# Patient Record
Sex: Male | Born: 1955 | Race: White | Hispanic: No | Smoking: Never smoker
Health system: Southern US, Community
[De-identification: ages and names within clinical notes are randomized; demographics above are authoritative.]

## PROBLEM LIST (undated history)

## (undated) DIAGNOSIS — I2510 Atherosclerotic heart disease of native coronary artery without angina pectoris: Secondary | ICD-10-CM

## (undated) DIAGNOSIS — I10 Essential (primary) hypertension: Secondary | ICD-10-CM

## (undated) DIAGNOSIS — N529 Male erectile dysfunction, unspecified: Secondary | ICD-10-CM

## (undated) DIAGNOSIS — Z5189 Encounter for other specified aftercare: Secondary | ICD-10-CM

## (undated) DIAGNOSIS — I4892 Unspecified atrial flutter: Secondary | ICD-10-CM

## (undated) DIAGNOSIS — L409 Psoriasis, unspecified: Secondary | ICD-10-CM

## (undated) DIAGNOSIS — E119 Type 2 diabetes mellitus without complications: Secondary | ICD-10-CM

## (undated) DIAGNOSIS — G5600 Carpal tunnel syndrome, unspecified upper limb: Secondary | ICD-10-CM

## (undated) DIAGNOSIS — I219 Acute myocardial infarction, unspecified: Secondary | ICD-10-CM

## (undated) DIAGNOSIS — M1990 Unspecified osteoarthritis, unspecified site: Secondary | ICD-10-CM

## (undated) DIAGNOSIS — E7800 Pure hypercholesterolemia, unspecified: Secondary | ICD-10-CM

## (undated) DIAGNOSIS — Z8719 Personal history of other diseases of the digestive system: Secondary | ICD-10-CM

## (undated) DIAGNOSIS — Z9289 Personal history of other medical treatment: Secondary | ICD-10-CM

## (undated) DIAGNOSIS — K219 Gastro-esophageal reflux disease without esophagitis: Secondary | ICD-10-CM

## (undated) HISTORY — PX: BICEPS TENDON REPAIR: SHX566

## (undated) HISTORY — PX: FRACTURE SURGERY: SHX138

## (undated) HISTORY — DX: Psoriasis, unspecified: L40.9

## (undated) HISTORY — DX: Essential (primary) hypertension: I10

## (undated) HISTORY — DX: Unspecified osteoarthritis, unspecified site: M19.90

## (undated) HISTORY — DX: Encounter for other specified aftercare: Z51.89

## (undated) HISTORY — DX: Type 2 diabetes mellitus without complications: E11.9

## (undated) HISTORY — DX: Acute myocardial infarction, unspecified: I21.9

## (undated) HISTORY — DX: Male erectile dysfunction, unspecified: N52.9

## (undated) HISTORY — DX: Atherosclerotic heart disease of native coronary artery without angina pectoris: I25.10

## (undated) HISTORY — DX: Carpal tunnel syndrome, unspecified upper limb: G56.00

## (undated) HISTORY — DX: Unspecified atrial flutter: I48.92

---
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
## 1977-12-03 DIAGNOSIS — Z9289 Personal history of other medical treatment: Secondary | ICD-10-CM

## 1977-12-03 HISTORY — DX: Personal history of other medical treatment: Z92.89

## 1977-12-03 HISTORY — PX: WRIST FRACTURE SURGERY: SHX121

## 2006-12-03 HISTORY — PX: CORONARY ANGIOPLASTY WITH STENT PLACEMENT: SHX49

## 2010-10-02 HISTORY — PX: CORONARY ARTERY BYPASS GRAFT: SHX141

## 2012-12-03 HISTORY — PX: CYST EXCISION: SHX5701

## 2017-06-03 LAB — HM DIABETES EYE EXAM

## 2017-07-02 ENCOUNTER — Encounter (HOSPITAL_COMMUNITY): Payer: Self-pay | Admitting: *Deleted

## 2017-07-02 ENCOUNTER — Inpatient Hospital Stay (HOSPITAL_COMMUNITY)
Admission: EM | Admit: 2017-07-02 | Discharge: 2017-07-05 | DRG: 309 | Disposition: A | Discharge status: Home / Self Care | Payer: BLUE CROSS/BLUE SHIELD | Attending: Family Medicine | Admitting: Family Medicine

## 2017-07-02 ENCOUNTER — Emergency Department (HOSPITAL_COMMUNITY): Payer: BLUE CROSS/BLUE SHIELD

## 2017-07-02 DIAGNOSIS — Z8249 Family history of ischemic heart disease and other diseases of the circulatory system: Secondary | ICD-10-CM | POA: Diagnosis not present

## 2017-07-02 DIAGNOSIS — Z7982 Long term (current) use of aspirin: Secondary | ICD-10-CM

## 2017-07-02 DIAGNOSIS — R001 Bradycardia, unspecified: Secondary | ICD-10-CM | POA: Diagnosis not present

## 2017-07-02 DIAGNOSIS — J449 Chronic obstructive pulmonary disease, unspecified: Secondary | ICD-10-CM | POA: Diagnosis present

## 2017-07-02 DIAGNOSIS — I4581 Long QT syndrome: Secondary | ICD-10-CM | POA: Diagnosis present

## 2017-07-02 DIAGNOSIS — Y92009 Unspecified place in unspecified non-institutional (private) residence as the place of occurrence of the external cause: Secondary | ICD-10-CM

## 2017-07-02 DIAGNOSIS — E1165 Type 2 diabetes mellitus with hyperglycemia: Secondary | ICD-10-CM | POA: Diagnosis present

## 2017-07-02 DIAGNOSIS — I119 Hypertensive heart disease without heart failure: Secondary | ICD-10-CM | POA: Diagnosis present

## 2017-07-02 DIAGNOSIS — I272 Pulmonary hypertension, unspecified: Secondary | ICD-10-CM | POA: Diagnosis present

## 2017-07-02 DIAGNOSIS — I4891 Unspecified atrial fibrillation: Secondary | ICD-10-CM | POA: Diagnosis present

## 2017-07-02 DIAGNOSIS — I484 Atypical atrial flutter: Secondary | ICD-10-CM | POA: Diagnosis not present

## 2017-07-02 DIAGNOSIS — Z7984 Long term (current) use of oral hypoglycemic drugs: Secondary | ICD-10-CM

## 2017-07-02 DIAGNOSIS — R531 Weakness: Secondary | ICD-10-CM

## 2017-07-02 DIAGNOSIS — I483 Typical atrial flutter: Secondary | ICD-10-CM | POA: Diagnosis present

## 2017-07-02 DIAGNOSIS — I2510 Atherosclerotic heart disease of native coronary artery without angina pectoris: Secondary | ICD-10-CM | POA: Diagnosis present

## 2017-07-02 DIAGNOSIS — I952 Hypotension due to drugs: Secondary | ICD-10-CM | POA: Diagnosis present

## 2017-07-02 DIAGNOSIS — G8921 Chronic pain due to trauma: Secondary | ICD-10-CM | POA: Diagnosis present

## 2017-07-02 DIAGNOSIS — Z951 Presence of aortocoronary bypass graft: Secondary | ICD-10-CM

## 2017-07-02 DIAGNOSIS — I959 Hypotension, unspecified: Secondary | ICD-10-CM | POA: Diagnosis not present

## 2017-07-02 DIAGNOSIS — T50905A Adverse effect of unspecified drugs, medicaments and biological substances, initial encounter: Secondary | ICD-10-CM | POA: Diagnosis present

## 2017-07-02 DIAGNOSIS — N179 Acute kidney failure, unspecified: Secondary | ICD-10-CM | POA: Diagnosis present

## 2017-07-02 DIAGNOSIS — I10 Essential (primary) hypertension: Secondary | ICD-10-CM | POA: Diagnosis not present

## 2017-07-02 DIAGNOSIS — I4892 Unspecified atrial flutter: Secondary | ICD-10-CM | POA: Diagnosis present

## 2017-07-02 HISTORY — DX: Gastro-esophageal reflux disease without esophagitis: K21.9

## 2017-07-02 HISTORY — DX: Personal history of other diseases of the digestive system: Z87.19

## 2017-07-02 HISTORY — DX: Type 2 diabetes mellitus without complications: E11.9

## 2017-07-02 HISTORY — DX: Acute myocardial infarction, unspecified: I21.9

## 2017-07-02 HISTORY — DX: Pure hypercholesterolemia, unspecified: E78.00

## 2017-07-02 HISTORY — DX: Atherosclerotic heart disease of native coronary artery without angina pectoris: I25.10

## 2017-07-02 HISTORY — DX: Personal history of other medical treatment: Z92.89

## 2017-07-02 HISTORY — DX: Essential (primary) hypertension: I10

## 2017-07-02 LAB — GLUCOSE, CAPILLARY
Glucose-Capillary: 147 mg/dL — ABNORMAL HIGH (ref 65–99)
Glucose-Capillary: 146 mg/dL — ABNORMAL HIGH (ref 65–99)

## 2017-07-02 LAB — BASIC METABOLIC PANEL
Anion gap: 10 (ref 5–15)
BUN: 47 mg/dL — ABNORMAL HIGH (ref 6–20)
CO2: 23 mmol/L (ref 22–32)
Calcium: 9.2 mg/dL (ref 8.9–10.3)
Chloride: 102 mmol/L (ref 101–111)
Creatinine, Ser: 2.07 mg/dL — ABNORMAL HIGH (ref 0.61–1.24)
GFR calc Af Amer: 38 mL/min — ABNORMAL LOW (ref >60)
GFR calc non Af Amer: 33 mL/min — ABNORMAL LOW (ref >60)
Glucose, Bld: 287 mg/dL — ABNORMAL HIGH (ref 65–99)
Potassium: 4.7 mmol/L (ref 3.5–5.1)
Sodium: 135 mmol/L (ref 135–145)

## 2017-07-02 LAB — URINALYSIS, ROUTINE W REFLEX MICROSCOPIC
Bilirubin Urine: NEGATIVE
Glucose, UA: NEGATIVE mg/dL
Hgb urine dipstick: NEGATIVE
Ketones, ur: NEGATIVE mg/dL
Leukocytes, UA: NEGATIVE
Nitrite: NEGATIVE
Protein, ur: 100 mg/dL — AB
Specific Gravity, Urine: 1.018 (ref 1.005–1.030)
Squamous Epithelial / LPF: NONE SEEN
pH: 5 (ref 5.0–8.0)

## 2017-07-02 LAB — I-STAT TROPONIN, ED: Troponin i, poc: 0.02 ng/mL (ref 0.00–0.08)

## 2017-07-02 LAB — CBC
HCT: 35 % — ABNORMAL LOW (ref 39.0–52.0)
Hemoglobin: 11.9 g/dL — ABNORMAL LOW (ref 13.0–17.0)
MCH: 33 pg (ref 26.0–34.0)
MCHC: 34 g/dL (ref 30.0–36.0)
MCV: 97 fL (ref 78.0–100.0)
Platelets: 236 10*3/uL (ref 150–400)
RBC: 3.61 MIL/uL — ABNORMAL LOW (ref 4.22–5.81)
RDW: 13.6 % (ref 11.5–15.5)
WBC: 5.7 10*3/uL (ref 4.0–10.5)

## 2017-07-02 LAB — LIPID PANEL
Cholesterol: 203 mg/dL — ABNORMAL HIGH (ref 0–200)
HDL: 36 mg/dL — ABNORMAL LOW (ref >40)
LDL Cholesterol: 142 mg/dL — ABNORMAL HIGH (ref 0–99)
Total CHOL/HDL Ratio: 5.6 RATIO
Triglycerides: 125 mg/dL (ref <150)
VLDL: 25 mg/dL (ref 0–40)

## 2017-07-02 LAB — TROPONIN I: Troponin I: <0.03 ng/mL (ref <0.03)

## 2017-07-02 LAB — TSH: TSH: 1.097 u[IU]/mL (ref 0.350–4.500)

## 2017-07-02 LAB — BRAIN NATRIURETIC PEPTIDE: B Natriuretic Peptide: 179 pg/mL — ABNORMAL HIGH (ref 0.0–100.0)

## 2017-07-02 LAB — CBG MONITORING, ED: Glucose-Capillary: 287 mg/dL — ABNORMAL HIGH (ref 65–99)

## 2017-07-02 MED ORDER — CLONIDINE HCL 0.1 MG PO TABS
0.1000 mg | ORAL_TABLET | Freq: Two times a day (BID) | ORAL | Status: DC
  Administered 2017-07-02: 0.1 mg via ORAL
  Filled 2017-07-02: qty 1

## 2017-07-02 MED ORDER — POTASSIUM CHLORIDE CRYS ER 10 MEQ PO TBCR
10.0000 meq | EXTENDED_RELEASE_TABLET | Freq: Every day | ORAL | Status: DC
  Administered 2017-07-03 – 2017-07-05 (×3): 10 meq via ORAL
  Filled 2017-07-02 (×3): qty 1

## 2017-07-02 MED ORDER — ACETAMINOPHEN 325 MG PO TABS
650.0000 mg | ORAL_TABLET | Freq: Four times a day (QID) | ORAL | Status: DC | PRN
  Administered 2017-07-04 (×2): 650 mg via ORAL
  Filled 2017-07-02 (×2): qty 2

## 2017-07-02 MED ORDER — SODIUM CHLORIDE 0.9 % IV BOLUS (SEPSIS)
500.0000 mL | Freq: Once | INTRAVENOUS | Status: AC
  Administered 2017-07-02: 500 mL via INTRAVENOUS

## 2017-07-02 MED ORDER — INSULIN ASPART 100 UNIT/ML ~~LOC~~ SOLN
0.0000 [IU] | Freq: Three times a day (TID) | SUBCUTANEOUS | Status: DC
  Administered 2017-07-02: 2 [IU] via SUBCUTANEOUS
  Administered 2017-07-03: 3 [IU] via SUBCUTANEOUS
  Administered 2017-07-03: 5 [IU] via SUBCUTANEOUS
  Administered 2017-07-03: 8 [IU] via SUBCUTANEOUS
  Administered 2017-07-04: 3 [IU] via SUBCUTANEOUS
  Administered 2017-07-04: 5 [IU] via SUBCUTANEOUS
  Administered 2017-07-04: 3 [IU] via SUBCUTANEOUS
  Administered 2017-07-05: 11 [IU] via SUBCUTANEOUS
  Administered 2017-07-05: 8 [IU] via SUBCUTANEOUS

## 2017-07-02 MED ORDER — METOPROLOL SUCCINATE ER 100 MG PO TB24
100.0000 mg | ORAL_TABLET | Freq: Two times a day (BID) | ORAL | Status: DC
  Administered 2017-07-02: 100 mg via ORAL
  Filled 2017-07-02: qty 1

## 2017-07-02 MED ORDER — PROMETHAZINE HCL 25 MG PO TABS: 12.5000 mg | ORAL_TABLET | Freq: Four times a day (QID) | ORAL | Status: DC | PRN

## 2017-07-02 MED ORDER — ASPIRIN EC 81 MG PO TBEC
81.0000 mg | DELAYED_RELEASE_TABLET | Freq: Every day | ORAL | Status: DC
  Administered 2017-07-03 – 2017-07-05 (×3): 81 mg via ORAL
  Filled 2017-07-02 (×3): qty 1

## 2017-07-02 MED ORDER — ACETAMINOPHEN 650 MG RE SUPP: 650.0000 mg | Freq: Four times a day (QID) | RECTAL | Status: DC | PRN

## 2017-07-02 MED ORDER — CLOPIDOGREL BISULFATE 75 MG PO TABS
75.0000 mg | ORAL_TABLET | Freq: Every day | ORAL | Status: DC
  Administered 2017-07-03 – 2017-07-05 (×3): 75 mg via ORAL
  Filled 2017-07-02 (×3): qty 1

## 2017-07-02 MED ORDER — DIGESTIVE ENZYME PO CAPS: ORAL_CAPSULE | Freq: Every day | ORAL | Status: DC

## 2017-07-02 MED ORDER — APIXABAN 5 MG PO TABS
5.0000 mg | ORAL_TABLET | Freq: Two times a day (BID) | ORAL | Status: DC
  Administered 2017-07-02 – 2017-07-05 (×6): 5 mg via ORAL
  Filled 2017-07-02 (×7): qty 1

## 2017-07-02 NOTE — ED Provider Notes (Signed)
[facility]-EMERGENCY DEPT Provider Note   CSN: [id number] Arrival date & time: 07/02/17  1109     History   Chief Complaint Chief Complaint  Patient presents with  . Dizziness  . Weakness    HPI Steve [name] is a 61 y.o. male.  HPI 61 year old Caucasian male past medical history significant for diabetes, hypertension, CAD status post CABG in 2011 that presents to the ED today with complaints of overall weakness, dizziness, lightheadedness with standing, lethargy. The patient states the symptoms have been ongoing for the past 2-3 weeks. States that his symptoms are worse with standing. Tolerating by mouth fluids without any difficulties. Patient does report some mild dyspnea on exertion over the past 2-3 weeks that is worsening. Also reports some worsening lower extremity edema. Denies any lower extremity pain or calf tenderness. Patient denies any orthopnea or nocturnal dyspnea.  Of note patient does report taking Viagra several times in the past 3 weeks. Patient denies any chest pain, palpitations, nausea, vomiting, diaphoresis, abdominal pain, urinary symptoms, change in bowel habits. Denies any fever or chills.  Of note unable to obtain accurate medical records given outside hospital. Past Medical History:  Diagnosis Date  . Coronary artery disease   . Diabetes mellitus without complication (HCC)   . Hypertension     There are no active problems to display for this patient.   Past Surgical History:  Procedure Laterality Date  . CARDIAC SURGERY    . HERNIA REPAIR    . JOINT REPLACEMENT     l knee       Home Medications    Prior to Admission medications   Medication Sig Start Date End Date Taking? Authorizing Provider  amLODipine (NORVASC) 5 MG tablet Take 5 mg by mouth 2 (two) times daily. 05/28/17  Yes [provider]  CALCIUM PO Take 1 tablet by mouth daily.   Yes [provider]  cloNIDine (CATAPRES) 0.1 MG tablet Take 0.2 mg by mouth 2 (two) times  daily. 06/24/17  Yes [provider]  clopidogrel (PLAVIX) 75 MG tablet Take 75 mg by mouth daily. 06/24/17  Yes [provider]  Digestive Enzymes (DIGESTIVE ENZYME PO) Take 1 capsule by mouth daily.   Yes [provider]  furosemide (LASIX) 20 MG tablet Take 40 mg by mouth daily. 06/24/17  Yes [provider]  glyBURIDE micronized (GLYNASE) 3 MG tablet Take 3 mg by mouth 2 (two) times daily with a meal. 05/29/17  Yes [provider]  ibuprofen (ADVIL,MOTRIN) 200 MG tablet Take 200 mg by mouth every 6 (six) hours as needed for moderate pain.   Yes [provider]  JANUMET 50-500 MG tablet Take 1 tablet by mouth 2 (two) times daily with a meal. 06/24/17  Yes [provider]  lisinopril (PRINIVIL,ZESTRIL) 20 MG tablet Take 20 mg by mouth 2 (two) times daily. 05/28/17  Yes [provider]  MAGNESIUM-ZINC PO Take 1 capsule by mouth daily as needed (supplement).   Yes [provider]  metoprolol (TOPROL-XL) 200 MG 24 hr tablet Take 200 mg by mouth 2 (two) times daily. 06/19/17  Yes [provider]  potassium chloride (MICRO-K) 10 MEQ CR capsule Take 10 mEq by mouth daily. with food 06/24/17  Yes [provider]  sildenafil (VIAGRA) 100 MG tablet Take 100 mg by mouth daily as needed for erectile dysfunction. 06/24/17  Yes [provider]  tiZANidine (ZANAFLEX) 4 MG capsule Take 4 mg by mouth 3 (three) times daily as needed  for muscle spasms. 06/24/17  Yes [provider]    Family History No family history on file.  Social History Social History  Substance Use Topics  . Smoking status: Never Smoker  . Smokeless tobacco: Never Used  . Alcohol use Yes     Comment: occ     Allergies   Patient has no known allergies.   Review of Systems Review of Systems  Constitutional: Negative for chills, diaphoresis and fever.  HENT: Negative for congestion.   Eyes: Negative for visual  disturbance.  Respiratory: Positive for shortness of breath. Negative for cough.   Cardiovascular: Positive for leg swelling. Negative for chest pain and palpitations.  Gastrointestinal: Negative for abdominal pain, diarrhea, nausea and vomiting.  Genitourinary: Negative for dysuria, flank pain, frequency, hematuria and urgency.  Musculoskeletal: Negative for arthralgias and myalgias.  Skin: Negative for rash.  Neurological: Positive for dizziness, syncope, weakness and light-headedness. Negative for numbness and headaches.  Psychiatric/Behavioral: Negative for sleep disturbance. The patient is not nervous/anxious.      Physical Exam Updated Vital Signs BP (!) 98/52 (BP Location: Right Arm)   Pulse (!) 58   Temp 98 F (36.7 C) (Oral)   Resp 12   SpO2 98%   Physical Exam  Constitutional: He is oriented to person, place, and time. He appears well-developed and well-nourished.  Non-toxic appearance. No distress.  HENT:  Head: Normocephalic and atraumatic.  Nose: Nose normal.  Mouth/Throat: Oropharynx is clear and moist.  Eyes: Pupils are equal, round, and reactive to light. Conjunctivae are normal. Right eye exhibits no discharge. Left eye exhibits no discharge.  Neck: Normal range of motion. Neck supple. No JVD present. No tracheal deviation present.  Cardiovascular: Regular rhythm, normal heart sounds and intact distal pulses.  Bradycardia present.  Exam reveals no gallop and no friction rub.   No murmur heard. Pulmonary/Chest: Effort normal and breath sounds normal. No respiratory distress. He has no wheezes. He has no rales. He exhibits no tenderness.  No hypoxia or tachypnea.  Abdominal: Soft. Bowel sounds are normal. He exhibits no distension. There is no tenderness. There is no rebound and no guarding.  Musculoskeletal: Normal range of motion.  Bilateral pitting edema 1+ at the level of the shin. No calf tenderness.  Lymphadenopathy:    He has no cervical adenopathy.    Neurological: He is alert and oriented to person, place, and time.  The patient is alert, attentive, and oriented x 3. Speech is clear. Cranial nerve II-VII grossly intact. Negative pronator drift. Sensation intact. Strength 5/5 in all extremities. Reflexes 2+ and symmetric at biceps, triceps, knees, and ankles. Rapid alternating movement and fine finger movements intact. Romberg is absent. Posture and gait normal.   Skin: Skin is warm and dry. Capillary refill takes less than 2 seconds. He is not diaphoretic. There is pallor.  Psychiatric: His behavior is normal. Judgment and thought content normal.  Nursing note and vitals reviewed.    ED Treatments / Results  Labs (all labs ordered are listed, but only abnormal results are displayed) Labs Reviewed  BASIC METABOLIC PANEL - Abnormal; Notable for the following:       Result Value   Glucose, Bld 287 (*)    BUN 47 (*)    Creatinine, Ser 2.07 (*)    GFR calc non Af Amer 33 (*)    GFR calc Af Amer 38 (*)    All other components within normal limits  CBC - Abnormal; Notable for the following:  RBC 3.61 (*)    Hemoglobin 11.9 (*)    HCT 35.0 (*)    All other components within normal limits  BRAIN NATRIURETIC PEPTIDE - Abnormal; Notable for the following:    B Natriuretic Peptide 179.0 (*)    All other components within normal limits  CBG MONITORING, ED - Abnormal; Notable for the following:    Glucose-Capillary 287 (*)    All other components within normal limits  URINALYSIS, ROUTINE W REFLEX MICROSCOPIC  I-STAT TROPONIN, ED    EKG  EKG Interpretation  Date/Time:  Tuesday July 02 2017 11:50:43 EDT Ventricular Rate:  61 PR Interval:    QRS Duration: 98 QT Interval:  466 QTC Calculation: 469 R Axis:   -16 Text Interpretation:  Atrial flutter Nonspecific ST and T wave abnormality Prolonged QT Abnormal ECG no previous EKG  Confirmed by Crista CurbLiu, Dana [phone]([id number]) on 07/02/2017 12:59:30 PM       Radiology Dg Chest 2 View  Result  Date: 07/02/2017 CLINICAL DATA:  lightheaded and dizzy with standing, lethargic, tired, and cant focus. Feels exhausted. Pt is hypotensive at triage.hx htn,dm EXAM: CHEST  2 VIEW COMPARISON:  None. FINDINGS: Sternotomy wires overlie enlarged cardiac silhouette. No effusion, infiltrate pneumothorax. No pulmonary edema. No acute osseous abnormality. IMPRESSION: Cardiomegaly without acute cardiopulmonary process identified. Electronically Signed   By: Genevive BiStewart  Edmunds M.D.   On: 07/02/2017 13:06    Procedures Procedures (including critical care time)  Medications Ordered in ED Medications  sodium chloride 0.9 % bolus 500 mL (not administered)     Initial Impression / Assessment and Plan / ED Course  I have reviewed the triage vital signs and the nursing notes.  Pertinent labs & imaging results that were available during my care of the patient were reviewed by me and considered in my medical decision making (see chart for details).     Patient presents to the ED with a complaint of dizziness, weakness, exertional dyspnea that has been worsening over the past 2-3 weeks. Patient is with history of CABG in 2011. Reports worsening lower extremity edema. Endorses some mild shortness of breath. Denies any chest pain or palpitations. Initially in triage patient was bradycardic and hypotensive. However patient was speaking complete sentences and looked overall well appearing. Patient given a small fluid bolus with improvement in his pressures. Heart rate has remained bradycardic in the low 40's.   EKG showed atrial flutter and bradycardia no old tracing to compare. Patient states he does have a history of arrhythmia but unsure what it is. Not currently anticoagulated. A troponin was negative. No leukocytosis. Patient with creatinine of 2.0 unknown baseline. No known CKD. BNP mildly elevated at 179. Chest x-ray shows cardiomegaly but no signs of infiltrate or pulmonary edema.  Do not feel that patient  needs emergent intervention at this time. Due to patient would benefit from admission to the hospital for observation and possible cardiology consult. Spoke with family medicine teaching service who agrees to admit patient will come to the ED to evaluate. Patient is currently hemodynamically stable with blood pressure that has improved with fluids. The patient was seen by my attending Dr. Verdie MosherLiu who is agreeable to the above plan. Final Clinical Impressions(s) / ED Diagnoses   Final diagnoses:  Weakness  Bradycardia  Hypotension, unspecified hypotension type    New Prescriptions New Prescriptions   No medications on file     Wallace KellerLeaphart, Kenneth T, PA-C 07/02/17 1510    Lavera GuiseLiu, Dana Duo, MD 07/02/17 [phone]  Lavera Guise, MD 07/02/17 [phone]

## 2017-07-02 NOTE — Consult Note (Signed)
Cardiology Consultation:   Patient ID: Steve [name]; [id number]; [birth year]   Admit date: 07/02/2017 Date of Consult: 07/02/2017  Primary Care Provider: System, Pcp Not In Primary Cardiologist: new, [name] Primary Electrophysiologist:  none   Patient Profile:   Steve [name] is a 61 y.o. male with a hx of CAD  who is being seen today for the evaluation of  Atrial flutter  at the request of  Dr. Perley JainMcdiarmid.  History of Present Illness:   [name] has a hx of CAD - s/p  Stenting ( 2008)  and then CABG ( 2011) , htn, hyperlipidemia, DM Has felt lighthead for the past several weeks. Fatigued,  No syncope . No angina ,  Not very active.limited by hip pain.    .  Is on plavix for his CAD   Works in the [employer]    Past Medical History:  Diagnosis Date  . Coronary artery disease   . Diabetes mellitus without complication (HCC)   . Hypertension     Past Surgical History:  Procedure Laterality Date  . CARDIAC SURGERY    . HERNIA REPAIR    . JOINT REPLACEMENT     l knee     Inpatient Medications: Scheduled Meds: . aspirin EC  81 mg Oral Daily   Continuous Infusions:  PRN Meds:   Allergies:   No Known Allergies  Social History:   Social History   Social History  . Marital status: Divorced    Spouse name: N/A  . Number of children: N/A  . Years of education: N/A   Occupational History  . Not on file.   Social History Main Topics  . Smoking status: Never Smoker  . Smokeless tobacco: Never Used  . Alcohol use Yes     Comment: occ  . Drug use: No  . Sexual activity: Not on file   Other Topics Concern  . Not on file   Social History Narrative  . No narrative on file    Non smoker Drinks occasionally   Family History:   The patient's family history includes CAD in his father and mother.  ROS:  Please see the history of present illness.  ROS  All other ROS reviewed and negative.     Physical Exam/Data:   Vitals:   07/02/17 1440  07/02/17 1441 07/02/17 1615 07/02/17 1630  BP: (!) 108/58 (!) 98/52 117/68 121/71  Pulse:   (!) 58 (!) 58  Resp:   (!) 21 (!) 22  Temp:      TempSrc:      SpO2:   97% 97%    Intake/Output Summary (Last 24 hours) at 07/02/17 1643 Last data filed at 07/02/17 1634  Gross per 24 hour  Intake              500 ml  Output                0 ml  Net              500 ml    Wt Readings from Last 3 Encounters:  07/02/17 (!) 320 lb (145.2 kg)   BP 140/82 (BP Location: Left Arm)   Pulse (!) 58   Temp 97.8 F (36.6 C) (Oral)   Resp (!) 22   Wt (!) 320 lb (145.2 kg)   SpO2 100%   BP 121/ 71 hR 60  There is no height or weight on file to calculate BMI.  General:  Well nourished, well  developed, in no acute distress HEENT: normal Lymph: no adenopathy Neck: no JVD Endocrine:  No thryomegaly Vascular: No carotid bruits; FA pulses 2+ bilaterally without bruits  Cardiac:  normal S1, S2; RRR; no murmur  Lungs:  clear to auscultation bilaterally, no wheezing, rhonchi or rales  Abd: soft, nontender, no hepatomegaly  Ext: no edema Musculoskeletal:  No deformities, BUE and BLE strength normal and equal Skin: warm and dry  Neuro:  CNs 2-12 intact, no focal abnormalities noted Psych:  Normal affect   EKG:  The EKG was personally reviewed and demonstrates:  Atrial flutter with controlled V response.   Telemetry:  Telemetry was personally reviewed and demonstrates:  Atrial flutter with slow / controlled V response  With Carotid sinus massage, he had a 5.2 second pause   Relevant CV Studies:   Laboratory Data:  Chemistry  Recent Labs Lab 07/02/17 1200  NA 135  K 4.7  CL 102  CO2 23  GLUCOSE 287*  BUN 47*  CREATININE 2.07*  CALCIUM 9.2  GFRNONAA 33*  GFRAA 38*  ANIONGAP 10    No results for input(s): PROT, ALBUMIN, AST, ALT, ALKPHOS, BILITOT in the last 168 hours. Hematology  Recent Labs Lab 07/02/17 1200  WBC 5.7  RBC 3.61*  HGB 11.9*  HCT 35.0*  MCV 97.0  MCH  33.0  MCHC 34.0  RDW 13.6  PLT 236   Cardiac EnzymesNo results for input(s): TROPONINI in the last 168 hours.   Recent Labs Lab 07/02/17 1217  TROPIPOC 0.02    BNP  Recent Labs Lab 07/02/17 1317  BNP 179.0*    DDimer No results for input(s): DDIMER in the last 168 hours.  Radiology/Studies:  Dg Chest 2 View  Result Date: 07/02/2017 CLINICAL DATA:  lightheaded and dizzy with standing, lethargic, tired, and cant focus. Feels exhausted. Pt is hypotensive at triage.hx htn,dm EXAM: CHEST  2 VIEW COMPARISON:  None. FINDINGS: Sternotomy wires overlie enlarged cardiac silhouette. No effusion, infiltrate pneumothorax. No pulmonary edema. No acute osseous abnormality. IMPRESSION: Cardiomegaly without acute cardiopulmonary process identified. Electronically Signed   By: Genevive BiStewart  Edmunds M.D.   On: 07/02/2017 13:06    Assessment and Plan:   1. Atrial fib / flutter  - chADS 2VASDC score   3 ( CAD, HTN DM) His HR is very slow - he is on several agents that would slow his HR He had a long pause when I performed carotid sinus massage.  He does not recall whether or not he has had atrial fib / flutter in the past .  [place]   Would start eliquis. Decrease his metoprolol by 1/2( to 100 mg BID)  and clonidine by 1/2. ( to 0.1 mg BID)   Echo   2.  Essential HTN:   Will decrease metoprolol and clonidine as above. We could probably decrease the amlodipine to 5 mg a day ( instead of BID)  I would anticipate continuing the Lisinopril at his home dose.   Continue lasix   3.  CAD :  No angina    Steve MissPhilip Nahser, MD  07/02/2017 4:43 PM

## 2017-07-02 NOTE — ED Notes (Signed)
Patient transported to X-ray 

## 2017-07-02 NOTE — H&P (Signed)
[facility] Admission History and Physical Service Pager: [phone]  Patient name: Steve [name] Ives Medical record number: [id number] Date of birth: May 24, 1956 Age: 61 y.o. Gender: male  Primary Care Provider: System, Pcp Not In Consultants: Cardiology  Code Status: FULL   Chief Complaint: weakness, shortness of breath   Assessment and Plan: Steve [name] Arntson is a 61 y.o. male presenting with progressive worsening of weakness and SOB x 2 weeks. PMH is significant for CAD s/p CABG in 2011, T2DM, HTN, carpel tunnel syndrome, R hip pain, and possible CHF.   Weakness Patient notes increasing weakness x 2 weeks. Patient reports feeling fatigued at work and unable to concentrate. Likely due to bradycardia and hypotension. Unlikely infectious in origin due to lack of fever or WBC. Unlikely related to hypoglycemia due to reported compliance with medications and CBGs averaging in 110-120s at home. Unlikely hypothyroidism due to lack of history of thyroid condition and no thyromegaly on exam. Possible anemia due to Hgb of 11.9 on admission, but less likely primary cause due to only slight drop in Hgb. Baseline Hgb unknown as patient is new to the area and no labs per chart review.  Patient is currently on Norvasc, lisinopril, toprol, viagra, and clonidine - all medications which can lower BP. At arrival to ED patient noted to be bradycardic, as low as 30-40 bpm. EKG in ED read as atrial flutter, questionable heart block per our read.  Cardiology was consulted and discussed that patient had atrial flutter with slow ventricular response. Recommendations to decrease home dose metoprolol and clonidine by half. Cardiology recommendations to also start Eliquis due to CHADVASC score of 3. In ED HR primarily in 50s and 60s, with lowest HR in 30s. Current HR 58. Patient also hypotensive with systolic pressures in the 80s at ED. Current BP of 117/68 following 500 cc bolus in ED. iStat troponin  negative in ED. CXR in ED negative.  -admit to telemetry, attending Dr. McDiarmid  -Continuous cardiac monitoring  -holding home medications of norvasc, lisinopril, and viagra -continue metoprolol and clonidine at half home dose -start Eliquis per pharmacy  -continue Plavix  -continue ASA 81 mg (pt reports taking daily however not on home med list) -Cardiology consulted, appreciate recommendations  -will likely need medication readjustment on discharge  -am CBC, and BMP  -trend troponins  -am EKG -Echo -Risk stratification labs: A1c, lipid panel, TSH -UA to rule out infectious etiology  -orthostatic BPs -vital signs per unit routine   Bradycardia Patient in ED found to be bradycardic with HR ranging from 41-59. ED physician noted as low as 30s. EKG in ED showing A flutter with slow ventricular response. Cardiology has been consulted, with recommendations to resume metoprolol and clonidine at half dose and to start Eliquis given CHADVASC score of 3.  -f/u cardiology recommendations  -monitor on telemetry -Hold home Clonidine and Tizanidine -am EKG -orthostatic BPs  Shortness of breath 2/2 possible CHF  Patient reports worsening SOB x 2 weeks, with worst episode 2 weeks ago when he had dyspnea when laying flat without PND. Patient now reports dyspnea on exertion, but denies cough or sputum production. Likely 2/2 fluid overload d/t possible history of CHF, 1+ pitting edema to bilateral LE on exam, and slight crackles auscultated on exam. Less likely new COPD due to lack of cough or sputum production and no history of smoking use. Less likely asthma related due to lack of history and no wheezes auscultated on exam. Possible cardiac in cause due  to history of cardiac disease and history of diabetes, but less likely given negative iStat troponin and EKG showing no ST elevations. Current O2 saturation of 97% on room air. Patient reported taking his lasix this morning.  Takes Lasix 20 mg BID at  home which he states is for his LE swelling.  Patient was given 500 cc bolus in ED due to hypotension. CXR negative on admission. Reports maybe having Echo done in past.  Pt does have BNP elevated to 179 and currently taking Lasix at home so may have some component of CHF.   -trend troponins  -Echo as above  -Hold home Lasix in setting of hypotension  -monitor fluid status  -monitor respiratory status  -continuous pulse ox   Anemia  On admission, CBC noting Hgb of 11.9, Hct of 35.0, MCV 97.0, platelets 236. Patient complained of fatigue and weakness  -continue to monitor -am CBC  Atrial flutter.  Seen on ED EKG.  Pt not on anticoagulation currently but has been told in the past he has an irregular heartbeat.  No history of CVA or DVT.  [place] in [place], [place].  Pt has a CHADSVASc score of 3.   -Per cards recs, start Eliquis    CAD s/p CABG 2011 Stable, denies CP and palpitations at this time.  No prior documentation in EMR as [place] [facility]  -cardiology consulted, appreciate recommendations.  At home takes Aspirin 81 mg and Plavix daily, although aspirin is not listed on home med list.  -trend troponins -A1C, lipid panel pending  -repeat EKG  -continue Plavix  - ASA 81 mg  T2DM Patient reports compliance with medications and CBGs ranging from 110-120 at home. Home medication of Janumet and Glyburide. On admission, CBG 287.  No recent A1c per records.  -Hold home hyperglycemic agents -continue to monitor  -Moderate sliding scale  -A1C  Prolonged QTc.  On ED EKG with QTc of 469.   -Phenergan for nausea -Avoid QTc prolonging medications   HTN.  Initially hypotensive on admission with BP 80s/60s.   Patient has reported history of HTN. Patient states he normally runs high, but average BP of 130/90 while on medications. Home medications of norvasc, lisinopril, toprol, and clonidine.  -hold home norvasc and lisinopril while hypotensive -continue toprol  and clonidine at half dose  Carpal Tunnel Patient has history of carpal tunnel syndrome, with no sensation in 2nd, 3rd, and 4th digits bilaterally.  -continue to monitor   Right hip pain, chronic Patient has history of right hip pain due to running accident. Patient states he will likely need hip replacement in the future. Patient is taking tizanidine prn for pain control and muscle spasm.  -discontinue tizanidine due to potential for bradycardia  -tylenol prn for pain -consider baclofen if increased pain   FEN/GI: carb modified/heart healthy diet  Prophylaxis: Eliquis   Disposition: admit to telemetry, attending Dr. McDiarmid  History of Present Illness:  Steve [name] Monfort is a 61 y.o. male presenting with progressive worsening of weakness and SOB x 2 weeks in duration. Patient reports symptoms began in the beginning of July and have progressively worsened with time. Patient states he has had increased leg swelling, despite daily lasix, and lightheadedness, dizziness, and SOB x 2 weeks. [place], so PMH is lacking in EMR. 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 Notes that along with lightheadedness and dizziness, patient has noticed decreased ability to concentrate and focus noted especially during a work meeting this morning. Patient has cardiac history of quadruple bypass in 2011 and is followed by Dr. Lodema Hong, cardiology, in [place], [place]. Patient states he was put on Toprol for "irregular heart beat" following procedure. Patient states his BP normally runs a little high, usually 130/90 with medications. Patient thinks he may have had an echo in the past.  Patient reports no strokes in the past. Patient denies nausea, vomiting, diarrhea, or headache. Patient is currently taking ASA, plavix, norvasc, lisinopril, toprol, and clonidine. Patient reports good compliance with  medications. Patient also reports worsening SOB x 2 weeks. Patient stated 2 weeks ago he was unable to lay in bed flat due to feelings of fluid in lungs, which was new in onset for him. Stated improvement since then. Patient endorses 2 pillow orthopnea w/out PND. Patient now notes dyspnea on exertion but not at rest. Patient denies cough or congestion, but endorses some sinus drainage which is normal for him. Along with cardiac medications patient reports compliance with Janumet and Glyburide w/ CBGs between 110-120, Lasix 20 mg bid, tizanidine prn, and multivitamins. Patient reports taking his home medications this morning.   Review Of Systems: Per HPI with the following additions:   Review of Systems  Constitutional: Negative for fever.  HENT: Positive for congestion. Negative for sore throat.   Respiratory: Positive for shortness of breath. Negative for cough, sputum production, wheezing and stridor.   Cardiovascular: Negative for chest pain and leg swelling.  Gastrointestinal: Negative for abdominal pain, nausea and vomiting.  Neurological: Positive for dizziness and weakness. Negative for tingling and headaches.   Patient Active Problem List   Diagnosis Date Noted  . Weakness 07/02/2017  . Bradycardia   . Hypotension    Past Medical History: Past Medical History:  Diagnosis Date  . Coronary artery disease   . Diabetes mellitus without complication (HCC)   . Hypertension    Past Surgical History: Past Surgical History:  Procedure Laterality Date  . CARDIAC SURGERY    . HERNIA REPAIR    . JOINT REPLACEMENT     l knee   Social History: Social History  Substance Use Topics  . Smoking status: Never Smoker  . Smokeless tobacco: Never Used  . Alcohol use Yes     Comment: occ   Additional social history: Lives at home alone.  Never smoker.  Occasional alcohol use. Denies drug use.   Please also refer to relevant sections of EMR.  Family History: Family History  Problem  Relation Age of Onset  . CAD Mother   . CAD Father    Mother passed away from heart attack age 30 and father passed away of a MI at age 74.    Allergies and Medications: No Known Allergies No current facility-administered medications on file prior to encounter.    No current outpatient prescriptions on file prior to encounter.   Objective: BP 140/82 (BP Location: Left Arm)   Pulse (!) 58   Temp 97.8 F (36.6 C) (Oral)   Resp (!) 22   Wt (!) 320 lb (145.2 kg)   SpO2 100%    Exam: General: awake and alert, NAD, lying in bed, oriented x 3  Eyes: PERRL, EOMI  ENTM: moist mucous membranes, o/p clear  Neck: neck supple, non tender, no thyromegaly  Cardiovascular: RRR, no MRG. 2+ radial pulses Respiratory: slight crackles auscultated  bilaterally  Gastrointestinal: soft, non tender, non distended, bowel sounds x 4 quadrants MSK: normal range of motion. 1+ pitting bilaterally.  Derm: skin intact  Neuro: no focal deficits. Equal sensation bilaterally  Psych: normal affect   Labs and Imaging: CBC BMET   Recent Labs Lab 07/02/17 1200  WBC 5.7  HGB 11.9*  HCT 35.0*  PLT 236    Recent Labs Lab 07/02/17 1200  NA 135  K 4.7  CL 102  CO2 23  BUN 47*  CREATININE 2.07*  GLUCOSE 287*  CALCIUM 9.2     BNP    Component Value Date/Time   BNP 179.0 (H) 07/02/2017 1317   Troponin (Point of Care Test)  Recent Labs  07/02/17 1217  TROPIPOC 0.02   CBG (last 3)   Recent Labs  07/02/17 1211 07/02/17 1805  GLUCAP 287* 147*   Dg Chest 2 View  Result Date: 07/02/2017 CLINICAL DATA:  lightheaded and dizzy with standing, lethargic, tired, and cant focus. Feels exhausted. Pt is hypotensive at triage.hx htn,dm EXAM: CHEST  2 VIEW COMPARISON:  None. FINDINGS: Sternotomy wires overlie enlarged cardiac silhouette. No effusion, infiltrate pneumothorax. No pulmonary edema. No acute osseous abnormality. IMPRESSION: Cardiomegaly without acute cardiopulmonary process identified.  Electronically Signed   By: Genevive Bi M.D.   On: 07/02/2017 13:06   Sherin Darin Engels PGY-1, [facility]   I have seen and examined the above patient with Dr. Darin Engels and agree with her documentation.  I have provided my corrections in blue.    Freddrick March, MD 07/02/2017, 6:17 PM PGY-2, [facility] FPTS Intern pager: [phone], text pages welcome

## 2017-07-02 NOTE — ED Triage Notes (Signed)
Pt states he started feeling bad mid July.  Pt states started having lightheaded and dizzy with standing, lethargic, tired, and cant focus.  Feels exhausted.  Pt is hypotensive at triage. Pt feels like an overall weakness. Pt reports sob and no chest pain

## 2017-07-02 NOTE — ED Notes (Signed)
Pt unable to urinate at this time.  

## 2017-07-02 NOTE — Progress Notes (Signed)
ANTICOAGULATION CONSULT NOTE - Initial Consult  Pharmacy Consult for Apixaban Indication: atrial flutter  No Known Allergies  Patient Measurements: Weight: (!) 320 lb (145.2 kg)  Vital Signs: Temp: 98 F (36.7 C) (07/31 1238) Temp Source: Oral (07/31 1238) BP: 124/71 (07/31 1700) Pulse Rate: 49 (07/31 1700)  Labs:  Recent Labs  07/02/17 1200  HGB 11.9*  HCT 35.0*  PLT 236  CREATININE 2.07*    CrCl cannot be calculated (Unknown ideal weight.).   Medical History: Past Medical History:  Diagnosis Date  . Coronary artery disease   . Diabetes mellitus without complication (HCC)   . Hypertension    Assessment: 61yom to begin apixaban for new aflutter. He does not meet criteria for dose reduction. Baseline CBC ok.  Goal of Therapy:  Monitor platelets by anticoagulation protocol: Yes   Plan:  1) Apixaban 5mg  po bid 2) Case management consult ordered for cost 3) Pharmacist education done  Fredrik RiggerMarkle, Steve [name] 07/02/2017,5:47 PM

## 2017-07-03 ENCOUNTER — Inpatient Hospital Stay (HOSPITAL_COMMUNITY): Payer: BLUE CROSS/BLUE SHIELD

## 2017-07-03 DIAGNOSIS — R001 Bradycardia, unspecified: Secondary | ICD-10-CM

## 2017-07-03 DIAGNOSIS — I2510 Atherosclerotic heart disease of native coronary artery without angina pectoris: Secondary | ICD-10-CM

## 2017-07-03 DIAGNOSIS — I959 Hypotension, unspecified: Secondary | ICD-10-CM

## 2017-07-03 LAB — LIPID PANEL
Cholesterol: 182 mg/dL (ref 0–200)
HDL: 31 mg/dL — ABNORMAL LOW (ref >40)
LDL Cholesterol: 122 mg/dL — ABNORMAL HIGH (ref 0–99)
Total CHOL/HDL Ratio: 5.9 RATIO
Triglycerides: 146 mg/dL (ref <150)
VLDL: 29 mg/dL (ref 0–40)

## 2017-07-03 LAB — CBC
HCT: 34.6 % — ABNORMAL LOW (ref 39.0–52.0)
Hemoglobin: 11.8 g/dL — ABNORMAL LOW (ref 13.0–17.0)
MCH: 32.7 pg (ref 26.0–34.0)
MCHC: 34.1 g/dL (ref 30.0–36.0)
MCV: 95.8 fL (ref 78.0–100.0)
Platelets: 199 10*3/uL (ref 150–400)
RBC: 3.61 MIL/uL — ABNORMAL LOW (ref 4.22–5.81)
RDW: 13.4 % (ref 11.5–15.5)
WBC: 4.8 10*3/uL (ref 4.0–10.5)

## 2017-07-03 LAB — BASIC METABOLIC PANEL
Anion gap: 6 (ref 5–15)
BUN: 32 mg/dL — ABNORMAL HIGH (ref 6–20)
CO2: 25 mmol/L (ref 22–32)
Calcium: 8.7 mg/dL — ABNORMAL LOW (ref 8.9–10.3)
Chloride: 105 mmol/L (ref 101–111)
Creatinine, Ser: 1.3 mg/dL — ABNORMAL HIGH (ref 0.61–1.24)
GFR calc Af Amer: >60 mL/min (ref >60)
GFR calc non Af Amer: 58 mL/min — ABNORMAL LOW (ref >60)
Glucose, Bld: 136 mg/dL — ABNORMAL HIGH (ref 65–99)
Potassium: 4.1 mmol/L (ref 3.5–5.1)
Sodium: 136 mmol/L (ref 135–145)

## 2017-07-03 LAB — ECHOCARDIOGRAM COMPLETE
Ao-asc: 30 cm
E decel time: 127 msec
FS: 1 % — AB (ref 28–44)
IVS/LV PW RATIO, ED: 1.66
LA ID, A-P, ES: 55 mm
LA diam end sys: 55 mm
LA diam index: 1.94 cm/m2
LA vol A4C: 125 ml
LA vol index: 50.8 mL/m2
LA vol: 144 mL
LV PW d: 11.6 mm — AB (ref 0.6–1.1)
LVOT area: 4.15 cm2
LVOT diameter: 23 mm
MV Dec: 127
MV Peak grad: 8 mmHg
MV pk A vel: 64.5 m/s
MV pk E vel: 138 m/s
PV Reg grad dias: 10 mmHg
PV Reg vel dias: 157 cm/s
RV sys press: 41 mmHg
Reg peak vel: 255 cm/s
TAPSE: 15.9 mm
TR max vel: 255 cm/s
Weight: 5100.56 oz

## 2017-07-03 LAB — GLUCOSE, CAPILLARY
Glucose-Capillary: 184 mg/dL — ABNORMAL HIGH (ref 65–99)
Glucose-Capillary: 236 mg/dL — ABNORMAL HIGH (ref 65–99)
Glucose-Capillary: 258 mg/dL — ABNORMAL HIGH (ref 65–99)
Glucose-Capillary: 184 mg/dL — ABNORMAL HIGH (ref 65–99)

## 2017-07-03 LAB — HIV ANTIBODY (ROUTINE TESTING W REFLEX): HIV Screen 4th Generation wRfx: NONREACTIVE

## 2017-07-03 LAB — TROPONIN I
Troponin I: <0.03 ng/mL (ref <0.03)
Troponin I: <0.03 ng/mL (ref <0.03)

## 2017-07-03 LAB — HEMOGLOBIN A1C
Hgb A1c MFr Bld: 8.4 % — ABNORMAL HIGH (ref 4.8–5.6)
Mean Plasma Glucose: 194 mg/dL

## 2017-07-03 MED ORDER — PERFLUTREN LIPID MICROSPHERE
1.0000 mL | INTRAVENOUS | Status: AC | PRN
  Filled 2017-07-03: qty 10

## 2017-07-03 MED ORDER — CLONIDINE HCL 0.1 MG PO TABS
0.0500 mg | ORAL_TABLET | Freq: Two times a day (BID) | ORAL | Status: DC
  Administered 2017-07-03: 0.05 mg via ORAL
  Filled 2017-07-03: qty 1

## 2017-07-03 MED ORDER — AMLODIPINE BESYLATE 5 MG PO TABS
5.0000 mg | ORAL_TABLET | Freq: Every day | ORAL | Status: DC
  Administered 2017-07-03: 5 mg via ORAL
  Filled 2017-07-03 (×2): qty 1

## 2017-07-03 MED ORDER — METOPROLOL SUCCINATE ER 50 MG PO TB24
50.0000 mg | ORAL_TABLET | Freq: Two times a day (BID) | ORAL | Status: DC
  Administered 2017-07-03: 50 mg via ORAL
  Filled 2017-07-03: qty 1

## 2017-07-03 MED ORDER — LOSARTAN POTASSIUM 50 MG PO TABS
50.0000 mg | ORAL_TABLET | Freq: Every day | ORAL | Status: DC
  Administered 2017-07-03 – 2017-07-05 (×3): 50 mg via ORAL
  Filled 2017-07-03 (×3): qty 1

## 2017-07-03 MED ORDER — FUROSEMIDE 40 MG PO TABS
40.0000 mg | ORAL_TABLET | Freq: Every day | ORAL | Status: DC
  Administered 2017-07-03 – 2017-07-05 (×3): 40 mg via ORAL
  Filled 2017-07-03 (×3): qty 1

## 2017-07-03 MED ORDER — INSULIN GLARGINE 100 UNIT/ML ~~LOC~~ SOLN
5.0000 [IU] | Freq: Every day | SUBCUTANEOUS | Status: DC
  Administered 2017-07-03 – 2017-07-04 (×2): 5 [IU] via SUBCUTANEOUS
  Filled 2017-07-03 (×2): qty 0.05

## 2017-07-03 NOTE — Care Management Note (Signed)
Case Management Note  Patient Details  Name: Steve MilesJohn [name] MRN: [id number] Date of Birth: 10-Jul-1956  Subjective/Objective:                    Action/Plan:  ELIQUIS  5 MG BID    COVER- YES  CO-PAY- $25.00  PRIOR APPROVAL- NO   2. ELIQUIS 2.50 MG  SAME AS ABOVE   MAIL-ORDER FOR 90 DAYS SUPPLY $ 75.00   PHARMACY : [facility]   Eliquis 30 day free card and $10.00 co pay given and explained to patient. Patient voiced understanding   Expected Discharge Date:                  Expected Discharge Plan:  Home/Self Care  In-House Referral:     Discharge planning Services  CM Consult, Medication Assistance  Post Acute Care Choice:    Choice offered to:  Patient  DME Arranged:    DME Agency:     HH Arranged:    HH Agency:     Status of Service:  Completed, signed off  If discussed at [employer] Length of Stay Meetings, dates discussed:    Additional Comments:  Kingsley PlanWile, Heather Marie, RN 07/03/2017, 11:37 AM

## 2017-07-03 NOTE — Progress Notes (Signed)
Family Medicine Teaching Service Daily Progress Note Intern Pager: [phone]  Patient name: Steve [name] Pittman Medical record number: [id number] Date of birth: Aug 20, 1956 Age: 61 y.o. Gender: male  Primary Care Provider: System, Pcp Not In Consultants: Cardiology Code Status: FULL  Pt Overview and Major Events to Date:  Admitted on 7/31  Assessment and Plan:  Steve [name] Lacosse is a 61 y.o. male presenting with progressive worsening of weakness and SOB x 2 weeks. PMH is significant for CAD s/p CABG in 2011, T2DM, HTN, carpel tunnel syndrome, R hip pain, and possible CHF.   Weakness Patient notes increasing weakness x 2 weeks. Patient reports feeling fatigued at work and unable to concentrate. Likely due to bradycardia and hypotension. Unlikely infectious in origin due to lack of fever or WBC. Unlikely related to hypoglycemia due to reported compliance with medications and CBGs averaging in 110-120s at home.  Possible anemia due to Hgb of 11.9 on admission, but less likely primary cause due to only slight drop in Hgb. Baseline Hgb unknown as patient is new to the area and no labs per chart review.  Patient is currently on Norvasc, lisinopril, toprol, viagra, and clonidine - all medications which can lower BP. At arrival to ED patient noted to be bradycardic, as low as 30-40 bpm. EKG in ED read as atrial flutter, questionable heart block per our read.  Cardiology was consulted and discussed that patient had atrial flutter with slow ventricular response. Recommendations to decrease home dose metoprolol and clonidine by half. Cardiology recommendations to also start Eliquis due to CHADVASC score of 3. In ED HR primarily in 50s and 60s, with lowest HR in 30s. Current HR 58. Patient also hypotensive with systolic pressures in the 80s at ED. Current BP of 117/68 following 500 cc bolus in ED. iStat troponin negative in ED. CXR in ED negative.  TSH 1.097. -Continuous cardiac monitoring  -holding home medications of  norvasc, lisinopril, and viagra -continue metoprolol and clonidine at half home dose -start Eliquis per pharmacy  -continue Plavix  -continue ASA 81 mg (pt reports taking daily however not on home med list) -Cardiology consulted, appreciate recommendations  -will likely need medication readjustment on discharge  -am CBC, and BMP  -trend troponins  -am EKG -Echo -Risk stratification labs: A1c, lipid panel, TSH -UA to rule out infectious etiology  -orthostatic BPs -vital signs per unit routine   Bradycardia Patient in ED found to be bradycardic with HR ranging from 41-59. ED physician noted as low as 30s. EKG in ED showing A flutter with slow ventricular response. Cardiology has been consulted, with recommendations to resume metoprolol and clonidine at half dose and to start Eliquis given CHADVASC score of 3.  -f/u cardiology recommendations  -monitor on telemetry -Hold home Clonidine and Tizanidine -am EKG -orthostatic BPs  Shortness of breath 2/2 possible CHF  Patient reports worsening SOB x 2 weeks, with worst episode 2 weeks ago when he had dyspnea when laying flat without PND. Patient now reports dyspnea on exertion, but denies cough or sputum production. Likely 2/2 fluid overload d/t possible history of CHF, 1+ pitting edema to bilateral LE on exam, and slight crackles auscultated on exam. Less likely new COPD due to lack of cough or sputum production and no history of smoking use. Less likely asthma related due to lack of history and no wheezes auscultated on exam. Possible cardiac in cause due to history of cardiac disease and history of diabetes, but less likely given negative iStat troponin and EKG  showing no ST elevations. Current O2 saturation of 97% on room air. Patient reported taking his lasix this morning.  Takes Lasix 20 mg BID at home which he states is for his LE swelling.  Patient was given 500 cc bolus in ED due to hypotension. CXR negative on admission. Reports maybe  having Echo done in past.  Pt does have BNP elevated to 179 and currently taking Lasix at home so may have some component of CHF.  Troponins negative x 3 -Echo as above  -restart home Lasix 40 mg daily due to increase in BP and 1+ pitting edema bilaterally -monitor fluid status  -monitor respiratory status  -continuous pulse ox   Anemia  On admission, CBC noting Hgb of 11.9, Hct of 35.0, MCV 97.0, platelets 236. Patient complained of fatigue and weakness  -continue to monitor -am CBC  Atrial flutter with slow response.  Seen on ED EKG.  Pt not on anticoagulation currently but has been told in the past he has an irregular heartbeat.  No history of CVA or DVT.  Follows with Cardiologist in [place], [place].  Pt has a CHADSVASc score of 3.   -Per cards recs, start Eliquis   -Per cards recs, weaning down metoprolol succinate and clonidine, now 50 mg and 0.5 mg respectively  CAD s/p CABG 2011 Stable, denies CP and palpitations at this time.  No prior documentation in EMR as patient is new to [facility].  -cardiology consulted, appreciate recommendations.  At home takes Aspirin 81 mg and Plavix daily, although aspirin is not listed on home med list.  Total cholesterol 203, TG 125, HDL 36, LDL 142.  Patient says that he was on a statin previously and it caused myalgias. -trend troponins -repeat EKG  -continue Plavix  - ASA 81 mg  T2DM Patient reports compliance with medications and CBGs ranging from 110-120 at home. Home medication of Janumet and Glyburide. On admission, CBG 287.  A1C 8.4 on 7/31. CBGs in mid-100s, highest was 246 on 8/1 -Hold home hyperglycemic agents -continue to monitor  -Moderate sliding scale   Prolonged QTc.  On ED EKG with QTc of 469.   -Phenergan for nausea -Avoid QTc prolonging medications   HTN.  Initially hypotensive on admission with BP 80s/60s.   Patient has reported history of HTN. Patient states he normally runs high, but average BP of 130/90 while on  medications.  BP 150/75 on 8/1. Home medications of norvasc, lisinopril, toprol, and clonidine.  -hold home lisinopril in setting of possible AKI -restart Norvasc 5 mg daily -discontinue metoprolol and clonidine per cards recs, start losartan 100 mg  Carpal Tunnel Patient has history of carpal tunnel syndrome, with no sensation in 2nd, 3rd, and 4th digits bilaterally.  -continue to monitor   Right hip pain, chronic Patient has history of right hip pain due to running accident. Patient states he will likely need hip replacement in the future. Patient is taking tizanidine prn for pain control and muscle spasm.  -discontinue tizanidine due to potential for bradycardia  -tylenol prn for pain -consider baclofen if increased pain   FEN/GI: carb modified/heart healthy diet  Prophylaxis: Eliquis   Disposition: continue on telemetry  Subjective:  Patient says he feels well and no longer has shortness of breath.  He says that he had been on a statin, but this caused myalgias, so this was stopped.  Tolerated breakfast well.  Objective: Temp:  [97.4 F (36.3 C)-98 F (36.7 C)] 97.7 F (36.5 C) (08/01 0542) Pulse  Rate:  [49-68] 57 (08/01 0542) Resp:  [12-27] 19 (08/01 0542) BP: (83-140)/(51-84) 128/65 (08/01 0542) SpO2:  [97 %-100 %] 100 % (08/01 0542) Weight:  [318 lb 12.6 oz (144.6 kg)-320 lb (145.2 kg)] 318 lb 12.6 oz (144.6 kg) (08/01 0542) Physical Exam: General: pleasant, obese man sitting in bedside chair in NAD Cardiovascular: RRR, no MRG Respiratory: CTAB Abdomen: nontender, +bowel sounds Extremities: 1+ pitting edema in bilateral lower extremities, no lesions noted  Laboratory:  Recent Labs Lab 07/02/17 1200 07/03/17 0543  WBC 5.7 4.8  HGB 11.9* 11.8*  HCT 35.0* 34.6*  PLT 236 199    Recent Labs Lab 07/02/17 1200 07/03/17 0543  NA 135 136  K 4.7 4.1  CL 102 105  CO2 23 25  BUN 47* 32*  CREATININE 2.07* 1.30*  CALCIUM 9.2 8.7*  GLUCOSE 287* 136*      Imaging/Diagnostic Tests: Dg Chest 2 View  Result Date: 07/02/2017 CLINICAL DATA:  lightheaded and dizzy with standing, lethargic, tired, and cant focus. Feels exhausted. Pt is hypotensive at triage.hx htn,dm EXAM: CHEST  2 VIEW COMPARISON:  None. FINDINGS: Sternotomy wires overlie enlarged cardiac silhouette. No effusion, infiltrate pneumothorax. No pulmonary edema. No acute osseous abnormality. IMPRESSION: Cardiomegaly without acute cardiopulmonary process identified. Electronically Signed   By: Genevive BiStewart  Edmunds M.D.   On: 07/02/2017 13:06    Lennox SoldersWinfrey, Amanda C, MD 07/03/2017, 3:12 PM PGY-1, [facility] FPTS Intern pager: [phone], text pages welcome

## 2017-07-03 NOTE — Progress Notes (Signed)
  Echocardiogram 2D Echocardiogram has been performed.  Steve [name] 07/03/2017, 5:08 PM

## 2017-07-03 NOTE — Progress Notes (Signed)
Progress Note  Patient Name: Steve MilesJohn [name] Date of Encounter: 07/03/2017  Primary Cardiologist: Nahser  Subjective   61 yo with hx of CAD, CABG, Presented with several weeks of presyncope, weakness Found to have atrial flutter with slow vent response Has a very exaggerated response to carotid sinus massage.     Inpatient Medications    Scheduled Meds: . apixaban  5 mg Oral BID  . aspirin EC  81 mg Oral Daily  . cloNIDine  0.1 mg Oral BID  . clopidogrel  75 mg Oral Daily  . insulin aspart  0-15 Units Subcutaneous TID WC  . metoprolol  100 mg Oral BID  . potassium chloride  10 mEq Oral Daily   Continuous Infusions:  PRN Meds: acetaminophen **OR** acetaminophen, promethazine   Vital Signs    Vitals:   07/02/17 1700 07/02/17 1807 07/02/17 2206 07/03/17 0542  BP: 124/71 140/82 123/84 128/65  Pulse: (!) 49 (!) 58 (!) 59 (!) 57  Resp: 19 (!) 22 20 19   Temp:  97.8 F (36.6 C) 97.7 F (36.5 C) 97.7 F (36.5 C)  TempSrc:  Oral Oral   SpO2: 98% 100% 98% 100%  Weight: (!) 320 lb (145.2 kg)   (!) 318 lb 12.6 oz (144.6 kg)    Intake/Output Summary (Last 24 hours) at 07/03/17 0859 Last data filed at 07/03/17 0500  Gross per 24 hour  Intake              500 ml  Output              750 ml  Net             -250 ml   Filed Weights   07/02/17 1700 07/03/17 0542  Weight: (!) 320 lb (145.2 kg) (!) 318 lb 12.6 oz (144.6 kg)    Telemetry    Atrial flutter with slow Vent response - Personally Reviewed  ECG    Atrial flutter  - Personally Reviewed  Physical Exam   GEN: No acute distress.   Neck: No JVD Cardiac: RRR, no murmurs, rubs, or gallops.  Respiratory: Clear to auscultation bilaterally. GI: Soft, nontender, non-distended  MS: No edema; No deformity. Neuro:  Nonfocal  Psych: Normal affect   Labs    Chemistry Recent Labs Lab 07/02/17 1200 07/03/17 0543  NA 135 136  K 4.7 4.1  CL 102 105  CO2 23 25  GLUCOSE 287* 136*  BUN 47* 32*  CREATININE  2.07* 1.30*  CALCIUM 9.2 8.7*  GFRNONAA 33* 58*  GFRAA 38* >60  ANIONGAP 10 6     Hematology Recent Labs Lab 07/02/17 1200 07/03/17 0543  WBC 5.7 4.8  RBC 3.61* 3.61*  HGB 11.9* 11.8*  HCT 35.0* 34.6*  MCV 97.0 95.8  MCH 33.0 32.7  MCHC 34.0 34.1  RDW 13.6 13.4  PLT 236 199    Cardiac Enzymes Recent Labs Lab 07/02/17 1939 07/03/17 0216 07/03/17 0543  TROPONINI <0.03 <0.03 <0.03    Recent Labs Lab 07/02/17 1217  TROPIPOC 0.02     BNP Recent Labs Lab 07/02/17 1317  BNP 179.0*     DDimer No results for input(s): DDIMER in the last 168 hours.   Radiology    Dg Chest 2 View  Result Date: 07/02/2017 CLINICAL DATA:  lightheaded and dizzy with standing, lethargic, tired, and cant focus. Feels exhausted. Pt is hypotensive at triage.hx htn,dm EXAM: CHEST  2 VIEW COMPARISON:  None. FINDINGS: Sternotomy wires overlie enlarged cardiac silhouette. No  effusion, infiltrate pneumothorax. No pulmonary edema. No acute osseous abnormality. IMPRESSION: Cardiomegaly without acute cardiopulmonary process identified. Electronically Signed   By: Genevive BiStewart  Edmunds M.D.   On: 07/02/2017 13:06    Cardiac Studies     Patient Profile     61 y.o. male  With CAD / CABG Presents with presyncope and slow atrial flutter  Assessment & Plan    1.  Atrial flutter.   Slow Ventricular response Will continue to taper down on his metoprolol and clonidine  Will continue to observe.   2. CAD :   Check lipids today   Signed, Kristeen MissPhilip Nahser, MD  07/03/2017, 8:59 AM

## 2017-07-04 DIAGNOSIS — R531 Weakness: Secondary | ICD-10-CM

## 2017-07-04 LAB — GLUCOSE, CAPILLARY
Glucose-Capillary: 200 mg/dL — ABNORMAL HIGH (ref 65–99)
Glucose-Capillary: 244 mg/dL — ABNORMAL HIGH (ref 65–99)
Glucose-Capillary: 189 mg/dL — ABNORMAL HIGH (ref 65–99)
Glucose-Capillary: 287 mg/dL — ABNORMAL HIGH (ref 65–99)

## 2017-07-04 LAB — CBC
HCT: 36.3 % — ABNORMAL LOW (ref 39.0–52.0)
Hemoglobin: 12.5 g/dL — ABNORMAL LOW (ref 13.0–17.0)
MCH: 33 pg (ref 26.0–34.0)
MCHC: 34.4 g/dL (ref 30.0–36.0)
MCV: 95.8 fL (ref 78.0–100.0)
Platelets: 215 10*3/uL (ref 150–400)
RBC: 3.79 MIL/uL — ABNORMAL LOW (ref 4.22–5.81)
RDW: 13.3 % (ref 11.5–15.5)
WBC: 5.6 10*3/uL (ref 4.0–10.5)

## 2017-07-04 LAB — BASIC METABOLIC PANEL
Anion gap: 8 (ref 5–15)
BUN: 21 mg/dL — ABNORMAL HIGH (ref 6–20)
CO2: 23 mmol/L (ref 22–32)
Calcium: 8.9 mg/dL (ref 8.9–10.3)
Chloride: 108 mmol/L (ref 101–111)
Creatinine, Ser: 1.18 mg/dL (ref 0.61–1.24)
GFR calc Af Amer: >60 mL/min (ref >60)
GFR calc non Af Amer: >60 mL/min (ref >60)
Glucose, Bld: 161 mg/dL — ABNORMAL HIGH (ref 65–99)
Potassium: 4 mmol/L (ref 3.5–5.1)
Sodium: 139 mmol/L (ref 135–145)

## 2017-07-04 MED ORDER — AMLODIPINE BESYLATE 10 MG PO TABS
10.0000 mg | ORAL_TABLET | Freq: Every day | ORAL | Status: DC
  Administered 2017-07-04 – 2017-07-05 (×2): 10 mg via ORAL
  Filled 2017-07-04 (×2): qty 1

## 2017-07-04 MED ORDER — DOXAZOSIN MESYLATE 4 MG PO TABS
4.0000 mg | ORAL_TABLET | Freq: Every day | ORAL | Status: DC
  Administered 2017-07-04: 4 mg via ORAL
  Filled 2017-07-04: qty 1

## 2017-07-04 NOTE — Progress Notes (Signed)
Inpatient Diabetes Program Recommendations  AACE/ADA: New Consensus Statement on Inpatient Glycemic Control (2015)  Target Ranges:  Prepandial:   less than 140 mg/dL      Peak postprandial:   less than 180 mg/dL (1-2 hours)      Critically ill patients:  140 - 180 mg/dL   Lab Results  Component Value Date   GLUCAP 244 (H) 07/04/2017   HGBA1C 8.4 (H) 07/02/2017    Review of Glycemic Control  Post-prandial blood sugars elevated. Would benefit from addition of basal insulin.  Inpatient Diabetes Program Recommendations:   Add Novolog 3 units tidwc for meal coverage insulin.  Continue to follow.  Thank you. Steve [name], RD, LDN, CDE Inpatient Diabetes Coordinator [phone]

## 2017-07-04 NOTE — Discharge Summary (Signed)
Family Medicine Teaching [facility] Discharge Summary  Patient name: Steve MilesJohn [name] Medical record number: [id number] Date of birth: December 23, 1955 Age: 61 y.o. Gender: male Date of Admission: 07/02/2017  Date of Discharge: 07/05/17 Admitting Physician: Steve MarchYashika Amin, MD  Primary Care Provider: System, Pcp Not In ([facility] Tuesday 8/7 @9 :30am) Consultants: Cardiology  Indication for Hospitalization: symptomatic hypotension and bradycardia  Discharge Diagnoses/Problem List:  Multiple medications contributing to hypotension/bradycardia  Disposition: Home  Discharge Condition: Stable  Discharge Exam:  Gen: well appearing, comfortable and conversational Card: rate was irregular but >100 and rhythm was regular, no murmurs noted Pulm: CTA bilaterally, no visible increased respiratory effort, no wheezes/crackles Abdom: belly soft with no tenderness  Brief Hospital Course:  Steve [name] was admitted on 07/02/17 for weakness and fatigue for the past 2 weeks.  He also reported that he was needing to sleep on two pillows and had felt short of breath.  He was found to have atrial flutter with slow ventricular response.  Cardiology was consulted for further management.    Issues for Follow Up:  1. Patient is interested in starting PCSK9 inhibitor for cholesterol control given his past intolerance of statins (caused myopathy).  Patient would like to know what the cost would be for this medication with his insurance. 2. Patient discharged with a new medication of Jardiance, which was added to his home medication of Janumet.  Glyburide was not continued at discharge.  This change was made due to evidence that Jardiance improves outcomes in patients with CAD while Glyburide has been shown to be detrimental to patients with CAD.  Patient's A1C should be monitored outpatient to see if this medication combination works well for  him. 3. Clonidine/glyburide/ibuprofen/lisinpril/metropol/tizanidine d/c, change amlodipine to daily 10 instead of BID 5,  4. Start taking apixiban, aspirin, carvedilol, doxazosin, losartan    Significant Procedures: N/A  Significant Labs and Imaging:   Recent Labs Lab 07/03/17 0543 07/04/17 0450 07/05/17 0426  WBC 4.8 5.6 4.5  HGB 11.8* 12.5* 12.3*  HCT 34.6* 36.3* 35.7*  PLT 199 215 215    Recent Labs Lab 07/02/17 1200 07/03/17 0543 07/04/17 0450 07/05/17 0426  NA 135 136 139 138  K 4.7 4.1 4.0 3.5  CL 102 105 108 105  CO2 23 25 23 24   GLUCOSE 287* 136* 161* 272*  BUN 47* 32* 21* 19  CREATININE 2.07* 1.30* 1.18 1.30*  CALCIUM 9.2 8.7* 8.9 8.8*      Results/Tests Pending at Time of Discharge: N/A  Discharge Medications:  Allergies as of 07/05/2017   No Known Allergies     Medication List    STOP taking these medications   cloNIDine 0.1 MG tablet Commonly known as:  CATAPRES   glyBURIDE micronized 3 MG tablet Commonly known as:  GLYNASE   ibuprofen 200 MG tablet Commonly known as:  ADVIL,MOTRIN   lisinopril 20 MG tablet Commonly known as:  PRINIVIL,ZESTRIL   metoprolol 200 MG 24 hr tablet Commonly known as:  TOPROL-XL   tiZANidine 4 MG capsule Commonly known as:  ZANAFLEX     TAKE these medications   amLODipine 10 MG tablet Commonly known as:  NORVASC Take 1 tablet (10 mg total) by mouth daily. What changed:  medication strength  how much to take  when to take this   apixaban 5 MG Tabs tablet Commonly known as:  ELIQUIS Take 1 tablet (5 mg total) by mouth 2 (two) times daily.   aspirin 81 MG EC tablet Take  1 tablet (81 mg total) by mouth daily.   CALCIUM PO Take 1 tablet by mouth daily.   carvedilol 6.25 MG tablet Commonly known as:  COREG Take 1 tablet (6.25 mg total) by mouth 2 (two) times daily with a meal.   clopidogrel 75 MG tablet Commonly known as:  PLAVIX Take 75 mg by mouth daily.   DIGESTIVE ENZYME PO Take 1  capsule by mouth daily.   doxazosin 4 MG tablet Commonly known as:  CARDURA Take 1 tablet (4 mg total) by mouth at bedtime.   furosemide 20 MG tablet Commonly known as:  LASIX Take 40 mg by mouth daily.   JANUMET 50-500 MG tablet Generic drug:  sitaGLIPtin-metformin Take 1 tablet by mouth 2 (two) times daily with a meal.   losartan 50 MG tablet Commonly known as:  COZAAR Take 1 tablet (50 mg total) by mouth daily.   MAGNESIUM-ZINC PO Take 1 capsule by mouth daily as needed (supplement).   potassium chloride 10 MEQ CR capsule Commonly known as:  MICRO-K Take 10 mEq by mouth daily. with food   sildenafil 100 MG tablet Commonly known as:  VIAGRA Take 100 mg by mouth daily as needed for erectile dysfunction.       Discharge Instructions: Please refer to Patient Instructions section of EMR for full details.  Patient was counseled important signs and symptoms that should prompt return to medical care, changes in medications, dietary instructions, activity restrictions, and follow up appointments.   Follow-Up Appointments: Follow-up Information    Nahser, Deloris PingPhilip J, MD Follow up.   Specialty:  Cardiology Contact information: [address] [place] [address] [phone]        [facility] 07[facility]2017.   Specialty:  Family Medicine Why:  appt @9 :30 am, please arrive 15 minutes early Contact information: [address] [id number] [address]c [address] [id number] [phone]         Marthenia RollingScott Bland, DO PGY-1, [facility]

## 2017-07-04 NOTE — Progress Notes (Addendum)
Progress Note  Patient Name: Steve MilesJohn [name] Date of Encounter: 07/04/2017  Primary Cardiologist: Nahser  Subjective   61 yo with hx of CAD, CABG, Presented with several weeks of presyncope, weakness Found to have atrial flutter with slow vent response Has a very exaggerated response to carotid sinus massage.   HR is better.    Inpatient Medications    Scheduled Meds: . amLODipine  5 mg Oral Daily  . apixaban  5 mg Oral BID  . aspirin EC  81 mg Oral Daily  . clopidogrel  75 mg Oral Daily  . furosemide  40 mg Oral Daily  . insulin aspart  0-15 Units Subcutaneous TID WC  . insulin glargine  5 Units Subcutaneous QHS  . losartan  50 mg Oral Daily  . potassium chloride  10 mEq Oral Daily   Continuous Infusions:  PRN Meds: acetaminophen **OR** acetaminophen, promethazine   Vital Signs    Vitals:   07/03/17 1400 07/03/17 2242 07/03/17 2326 07/04/17 0631  BP: (!) 150/75 (!) 163/80 (!) 150/86 (!) 162/86  Pulse: 71 72 72 79  Resp: 18 17  18   Temp: 98.6 F (37 C) 98.3 F (36.8 C)  97.7 F (36.5 C)  TempSrc: Oral Oral  Oral  SpO2: 98% 98%  97%  Weight:    (!) 316 lb 3.2 oz (143.4 kg)    Intake/Output Summary (Last 24 hours) at 07/04/17 0830 Last data filed at 07/04/17 0634  Gross per 24 hour  Intake             1420 ml  Output                0 ml  Net             1420 ml   Filed Weights   07/02/17 1700 07/03/17 0542 07/04/17 0631  Weight: (!) 320 lb (145.2 kg) (!) 318 lb 12.6 oz (144.6 kg) (!) 316 lb 3.2 oz (143.4 kg)    Telemetry    Atrial flutter with slow Vent response - Personally Reviewed  ECG    Atrial flutter  - HR of 80   Personally Reviewed  Physical Exam   GEN: No acute distress.   Neck: No JVD Cardiac: RRR, no murmurs, rubs, or gallops.  Respiratory: Clear to auscultation bilaterally. GI: Soft, nontender, non-distended  MS: No edema; No deformity. Neuro:  Nonfocal  Psych: Normal affect   Labs    Chemistry  Recent Labs Lab  07/02/17 1200 07/03/17 0543 07/04/17 0450  NA 135 136 139  K 4.7 4.1 4.0  CL 102 105 108  CO2 23 25 23   GLUCOSE 287* 136* 161*  BUN 47* 32* 21*  CREATININE 2.07* 1.30* 1.18  CALCIUM 9.2 8.7* 8.9  GFRNONAA 33* 58* >60  GFRAA 38* >60 >60  ANIONGAP 10 6 8      Hematology  Recent Labs Lab 07/02/17 1200 07/03/17 0543 07/04/17 0450  WBC 5.7 4.8 5.6  RBC 3.61* 3.61* 3.79*  HGB 11.9* 11.8* 12.5*  HCT 35.0* 34.6* 36.3*  MCV 97.0 95.8 95.8  MCH 33.0 32.7 33.0  MCHC 34.0 34.1 34.4  RDW 13.6 13.4 13.3  PLT 236 199 215    Cardiac Enzymes  Recent Labs Lab 07/02/17 1939 07/03/17 0216 07/03/17 0543  TROPONINI <0.03 <0.03 <0.03     Recent Labs Lab 07/02/17 1217  TROPIPOC 0.02     BNP  Recent Labs Lab 07/02/17 1317  BNP 179.0*     DDimer No  results for input(s): DDIMER in the last 168 hours.   Radiology    Dg Chest 2 View  Result Date: 07/02/2017 CLINICAL DATA:  lightheaded and dizzy with standing, lethargic, tired, and cant focus. Feels exhausted. Pt is hypotensive at triage.hx htn,dm EXAM: CHEST  2 VIEW COMPARISON:  None. FINDINGS: Sternotomy wires overlie enlarged cardiac silhouette. No effusion, infiltrate pneumothorax. No pulmonary edema. No acute osseous abnormality. IMPRESSION: Cardiomegaly without acute cardiopulmonary process identified. Electronically Signed   By: Genevive BiStewart  Edmunds M.D.   On: 07/02/2017 13:06    Cardiac Studies     Patient Profile     61 y.o. male  With CAD / CABG Presents with presyncope and slow atrial flutter  Assessment & Plan    1.  Atrial flutter:    Normal ventricular response at this point Will consider EP consultation for consideration for ablation Continue Eliquis.    2. CAD :    No angina   3.  HTN:  BP is up a little. Will start Cardura 4 mg po Q HS   We will see him in follow up   Contact info:  Phil Nahser [facility] [address]  [phone]  Please ask for my nurse ,  Marcelino DusterMichelle.      Signed, Kristeen MissPhilip Nahser, MD  07/04/2017, 8:30 AM

## 2017-07-04 NOTE — Progress Notes (Signed)
Family Medicine Teaching Service Daily Progress Note Intern Pager: [phone]  Patient name: Steve [name] Medical record number: [id number] Date of birth: Oct 21, 1956 Age: 61 y.o. Gender: male  Primary Care Provider: System, Pcp Not In Consultants: Cardiology Code Status: FULL  Pt Overview and Major Events to Date:  Admitted on 7/31  Assessment and Plan:  Steve MilesJohn Stgermain is a 61 y.o. male presenting with progressive worsening of weakness and SOB x 2 weeks. PMH is significant for CAD s/p CABG in 2011, T2DM, HTN, carpel tunnel syndrome, R hip pain, and possible CHF.   Weakness Patient notes increasing weakness x 2 weeks. Patient reports feeling fatigued at work and unable to concentrate. Likely due to bradycardia and hypotension. Unlikely infectious in origin due to lack of fever or WBC. Unlikely related to hypoglycemia due to reported compliance with medications and CBGs averaging in 110-120s at home.  Possible anemia due to Hgb of 11.9 on admission, but less likely primary cause due to only slight drop in Hgb. Baseline Hgb unknown as patient is new to the area and no labs per chart review.  Patient is currently on Norvasc, lisinopril, toprol, viagra, and clonidine - all medications which can lower BP. At arrival to ED patient noted to be bradycardic, as low as 30-40 bpm. EKG in ED read as atrial flutter, questionable heart block per our read.  Cardiology was consulted and discussed that patient had atrial flutter with slow ventricular response. Recommendations to decrease home dose metoprolol and clonidine by half. Cardiology recommendations to also start Eliquis due to CHADVASC score of 3. In ED HR primarily in 50s and 60s, with lowest HR in 30s. Current HR 58. Patient also hypotensive with systolic pressures in the 80s at ED. Current BP of 117/68 following 500 cc bolus in ED. iStat troponin negative in ED. CXR in ED negative.  TSH 1.097.  Echo showed moderate LVH, EF 60-65%, mild pulmonary  hypertension (peak pressure 41 mm Hg) -Continuous cardiac monitoring  -holding home medications of lisinopril and viagra -start Eliquis per pharmacy  -continue Plavix  -continue ASA 81 mg (pt reports taking daily however not on home med list) -Cardiology consulted, appreciate recommendations  -will likely need medication readjustment on discharge  -am CBC, and BMP  -trend troponins  -am EKG -UA to rule out infectious etiology  -orthostatic BPs -vital signs per unit routine   Atrial flutter with slow response.  Seen on ED EKG, along with bradycardia with HR ranging from 41-59. ED physician noted as low as 30s. EKG in ED showing A flutter with slow ventricular response.Pt not on anticoagulation currently but has been told in the past he has an irregular heartbeat.  No history of CVA or DVT.  [place]  Pt has a CHADSVASc score of 3.   -f/u cardiology recommendations: considering consulting EP for ablation, continuing Eliquis -monitor on telemetry -Hold home Clonidine and Tizanidine -am EKG -orthostatic BPs  Shortness of breath 2/2 possible CHF  Patient reports worsening SOB x 2 weeks, with worst episode 2 weeks ago when he had dyspnea when laying flat without PND. Patient now reports dyspnea on exertion, but denies cough or sputum production. Likely 2/2 fluid overload d/t possible history of CHF, 1+ pitting edema to bilateral LE on exam, and slight crackles auscultated on exam. Less likely new COPD due to lack of cough or sputum production and no history of smoking use. Less likely asthma related due to lack of history and no wheezes auscultated  on exam. Possible cardiac in cause due to history of cardiac disease and history of diabetes, but less likely given negative iStat troponin and EKG showing no ST elevations. Current O2 saturation of 97% on room air. Patient reported taking his lasix this morning.  Takes Lasix 20 mg BID at home which he states is for his  LE swelling.  Patient was given 500 cc bolus in ED due to hypotension. CXR negative on admission. Reports maybe having Echo done in past.  Pt does have BNP elevated to 179 and currently taking Lasix at home so may have some component of CHF.  Troponins negative x 3.  Echo negative for systolic dysfunction, diastolic function unable to be assessed due to atrial fibrillation. -restart home Lasix 40 mg daily due to increase in BP and 1+ pitting edema bilaterally -monitor fluid status  -monitor respiratory status  -continuous pulse ox   Anemia  On admission, CBC noting Hgb of 11.9, Hct of 35.0, MCV 97.0, platelets 236. Patient complained of fatigue and weakness  -continue to monitor -daily CBC   CAD s/p CABG 2011 Stable, denies CP and palpitations at this time.  No prior documentation in EMR as patient is new to [facility].  -cardiology consulted, appreciate recommendations.  At home takes Aspirin 81 mg and Plavix daily, although aspirin is not listed on home med list.  Total cholesterol 203, TG 125, HDL 36, LDL 142.  Patient says that he was on a statin previously and it caused myalgias. -trend troponins -repeat EKG  -continue Plavix  - ASA 81 mg  T2DM Patient reports compliance with medications and CBGs ranging from 110-120 at home. Home medication of Janumet and Glyburide. On admission, CBG 287.  A1C 8.4 on 7/31. CBGs in mid-100s, highest was 246 on 8/1 -Hold home hyperglycemic agents -continue to monitor  -Moderate sliding scale with 5 U Lantus -will monitor total sliding scale used in next 24 hours and adjust Lantus as needed  Prolonged QTc.  On ED EKG with QTc of 469.   -Phenergan for nausea -Avoid QTc prolonging medications   HTN.  Initially hypotensive on admission with BP 80s/60s.   Patient has reported history of HTN. Patient states he normally runs high, but average BP of 130/90 while on medications.  BP 162/86 on 8/2. Home medications of norvasc, lisinopril, toprol, and  clonidine.  -hold home lisinopril in setting of possible AKI -cards recommended starting Cardura (doxazosin) 4 mg PO QHS -restarted Norvasc 5 mg daily on 8/1, will increase to 10 mg daily (approved by Cardiology) -discontinued metoprolol and clonidine per cards recs, started losartan 100 mg on 8/1  Carpal Tunnel Patient has history of carpal tunnel syndrome, with no sensation in 2nd, 3rd, and 4th digits bilaterally.  -continue to monitor   Right hip pain, chronic Patient has history of right hip pain due to running accident. Patient states he will likely need hip replacement in the future. Patient is taking tizanidine prn for pain control and muscle spasm.  -discontinue tizanidine due to potential for bradycardia  -tylenol prn for pain -consider baclofen if increased pain   AKI Patient's baseline creatinine is unknown, but creatinine was elevated on admission at 2.07.  Could be related to poor perfusion given bradycardia and hypotension.  Continues to improve from 2.07-->1.30-->1.18 -started Cozaar 50 mg on 8/1  FEN/GI: carb modified/heart healthy diet  Prophylaxis: Eliquis   Disposition: continue on telemetry  Subjective:  Patient says he feels well but does have a headache.  He  also had a headache overnight.  He attributes it to his pressures being higher than they usually are at home.  When he woke up this morning, he had a bit of a cough and thinks that he might have had fluid in his lungs.  He is glad to have Dr. Elease HashimotoNahser as an outpatient cardiologist.  He denies shortness of breath or chest pain.  Objective: Temp:  [97.7 F (36.5 C)-98.6 F (37 C)] 97.7 F (36.5 C) (08/02 0631) Pulse Rate:  [71-79] 79 (08/02 0631) Resp:  [17-18] 18 (08/02 0631) BP: (150-163)/(67-86) 162/86 (08/02 0631) SpO2:  [97 %-98 %] 97 % (08/02 0631) Weight:  [316 lb 3.2 oz (143.4 kg)] 316 lb 3.2 oz (143.4 kg) (08/02 0631) Physical Exam: General: pleasant, obese man sitting in bedside chair in  NAD Cardiovascular: RRR, no MRG Respiratory: CTAB Abdomen: nontender, +bowel sounds Extremities: 1+ pitting edema in bilateral lower extremities, no lesions noted  Laboratory:  Recent Labs Lab 07/02/17 1200 07/03/17 0543 07/04/17 0450  WBC 5.7 4.8 5.6  HGB 11.9* 11.8* 12.5*  HCT 35.0* 34.6* 36.3*  PLT 236 199 215    Recent Labs Lab 07/02/17 1200 07/03/17 0543 07/04/17 0450  NA 135 136 139  K 4.7 4.1 4.0  CL 102 105 108  CO2 23 25 23   BUN 47* 32* 21*  CREATININE 2.07* 1.30* 1.18  CALCIUM 9.2 8.7* 8.9  GLUCOSE 287* 136* 161*     Imaging/Diagnostic Tests: Dg Chest 2 View  Result Date: 07/02/2017 CLINICAL DATA:  lightheaded and dizzy with standing, lethargic, tired, and cant focus. Feels exhausted. Pt is hypotensive at triage.hx htn,dm EXAM: CHEST  2 VIEW COMPARISON:  None. FINDINGS: Sternotomy wires overlie enlarged cardiac silhouette. No effusion, infiltrate pneumothorax. No pulmonary edema. No acute osseous abnormality. IMPRESSION: Cardiomegaly without acute cardiopulmonary process identified. Electronically Signed   By: Genevive BiStewart  Edmunds M.D.   On: 07/02/2017 13:06   Echo from 8/1: Study Conclusions  - Left ventricle: The cavity size was normal. Wall thickness was   increased in a pattern of moderate LVH. Systolic function was   normal. The estimated ejection fraction was in the range of 60%   to 65%. Indeterminant diastolic function (atrial fibrillation).   Wall motion was normal; there were no regional wall motion   abnormalities. - Aortic valve: There was no stenosis. - Mitral valve: There was trivial regurgitation. - Left atrium: The atrium was moderately dilated. - Right ventricle: The cavity size was mildly dilated. Systolic   function was normal. - Right atrium: The atrium was moderately dilated. - Tricuspid valve: Peak RV-RA gradient (S): 26 mm Hg. - Pulmonary arteries: PA peak pressure: 41 mm Hg (S). - Systemic veins: IVC measured 3.1 cm with < 50%  respirophasic   variation, suggesting RA pressure 15 mmHg.  Impressions:  - The patient appears to be in atrial fibrillation. Normal LV size   with moderate LV hypertrophy. EF 60-65%. Mildly dilated RV with   normal systolic function. Biatrial enlargement. Mild pulmonary   hypertension. Dilated IVC suggestive of elevated RV filling   pressure.  Lennox SoldersWinfrey, Amanda C, MD 07/04/2017, 7:21 AM PGY-1, [facility] FPTS Intern pager: [phone], text pages welcome

## 2017-07-05 ENCOUNTER — Encounter: Payer: Self-pay | Admitting: Family Medicine

## 2017-07-05 DIAGNOSIS — R001 Bradycardia, unspecified: Secondary | ICD-10-CM

## 2017-07-05 DIAGNOSIS — I10 Essential (primary) hypertension: Secondary | ICD-10-CM

## 2017-07-05 LAB — CBC
HCT: 35.7 % — ABNORMAL LOW (ref 39.0–52.0)
Hemoglobin: 12.3 g/dL — ABNORMAL LOW (ref 13.0–17.0)
MCH: 32.9 pg (ref 26.0–34.0)
MCHC: 34.5 g/dL (ref 30.0–36.0)
MCV: 95.5 fL (ref 78.0–100.0)
Platelets: 215 10*3/uL (ref 150–400)
RBC: 3.74 MIL/uL — ABNORMAL LOW (ref 4.22–5.81)
RDW: 13.3 % (ref 11.5–15.5)
WBC: 4.5 10*3/uL (ref 4.0–10.5)

## 2017-07-05 LAB — GLUCOSE, CAPILLARY
Glucose-Capillary: 308 mg/dL — ABNORMAL HIGH (ref 65–99)
Glucose-Capillary: 253 mg/dL — ABNORMAL HIGH (ref 65–99)

## 2017-07-05 LAB — BASIC METABOLIC PANEL
Anion gap: 9 (ref 5–15)
BUN: 19 mg/dL (ref 6–20)
CO2: 24 mmol/L (ref 22–32)
Calcium: 8.8 mg/dL — ABNORMAL LOW (ref 8.9–10.3)
Chloride: 105 mmol/L (ref 101–111)
Creatinine, Ser: 1.3 mg/dL — ABNORMAL HIGH (ref 0.61–1.24)
GFR calc Af Amer: >60 mL/min (ref >60)
GFR calc non Af Amer: 58 mL/min — ABNORMAL LOW (ref >60)
Glucose, Bld: 272 mg/dL — ABNORMAL HIGH (ref 65–99)
Potassium: 3.5 mmol/L (ref 3.5–5.1)
Sodium: 138 mmol/L (ref 135–145)

## 2017-07-05 MED ORDER — DOXAZOSIN MESYLATE 4 MG PO TABS: 4.0000 mg | ORAL_TABLET | Freq: Every day | ORAL | 0 refills | Status: DC

## 2017-07-05 MED ORDER — LOSARTAN POTASSIUM 50 MG PO TABS: 50.0000 mg | ORAL_TABLET | Freq: Every day | ORAL | 0 refills | Status: DC

## 2017-07-05 MED ORDER — APIXABAN 5 MG PO TABS
5.0000 mg | ORAL_TABLET | Freq: Two times a day (BID) | ORAL | 0 refills | Status: DC
Start: 2017-07-05 — End: 2017-07-31

## 2017-07-05 MED ORDER — CARVEDILOL 6.25 MG PO TABS: 6.2500 mg | ORAL_TABLET | Freq: Two times a day (BID) | ORAL | Status: DC

## 2017-07-05 MED ORDER — CARVEDILOL 6.25 MG PO TABS: 6.2500 mg | ORAL_TABLET | Freq: Two times a day (BID) | ORAL | 0 refills | Status: DC

## 2017-07-05 MED ORDER — AMLODIPINE BESYLATE 10 MG PO TABS: 10.0000 mg | ORAL_TABLET | Freq: Every day | ORAL | 0 refills | Status: DC

## 2017-07-05 MED ORDER — ASPIRIN 81 MG PO TBEC: 81.0000 mg | DELAYED_RELEASE_TABLET | Freq: Every day | ORAL | 0 refills | Status: DC

## 2017-07-05 NOTE — Progress Notes (Signed)
Progress Note  Patient Name: Steve [name] Date of Encounter: 07/05/2017  Primary Cardiologist: Nahser  Subjective   61 yo with hx of CAD, CABG, Presented with several weeks of presyncope, weakness Found to have atrial flutter with slow vent response Has a very exaggerated response to carotid sinus massage.   HR is better. Actually a bit fast today    Inpatient Medications    Scheduled Meds: . amLODipine  10 mg Oral Daily  . apixaban  5 mg Oral BID  . aspirin EC  81 mg Oral Daily  . carvedilol  6.25 mg Oral BID WC  . clopidogrel  75 mg Oral Daily  . doxazosin  4 mg Oral QHS  . furosemide  40 mg Oral Daily  . insulin aspart  0-15 Units Subcutaneous TID WC  . insulin glargine  5 Units Subcutaneous QHS  . losartan  50 mg Oral Daily  . potassium chloride  10 mEq Oral Daily   Continuous Infusions:  PRN Meds: acetaminophen **OR** acetaminophen, promethazine   Vital Signs    Vitals:   07/04/17 1549 07/04/17 2027 07/04/17 2159 07/05/17 0502  BP: (!) 151/99 (!) 194/99 (!) 171/91 (!) 183/84  Pulse:  91 92 87  Resp:  18  18  Temp:  99.9 F (37.7 C)  98 F (36.7 C)  TempSrc:  Oral  Oral  SpO2:  100%  98%  Weight:    (!) 314 lb 3.2 oz (142.5 kg)    Intake/Output Summary (Last 24 hours) at 07/05/17 1132 Last data filed at 07/05/17 0500  Gross per 24 hour  Intake             1300 ml  Output                0 ml  Net             1300 ml   Filed Weights   07/03/17 0542 07/04/17 0631 07/05/17 0502  Weight: (!) 318 lb 12.6 oz (144.6 kg) (!) 316 lb 3.2 oz (143.4 kg) (!) 314 lb 3.2 oz (142.5 kg)    Telemetry    Atrial flutter with rapid ventricular response - 1110   ECG    Atrial flutter  - HR of 80   Personally Reviewed  Physical Exam   GEN: No acute distress.   Neck: No JVD Cardiac: RRR, no murmurs, rubs, or gallops.  Respiratory: Clear to auscultation bilaterally. GI: Soft, nontender, non-distended  MS: No edema; No deformity. Neuro:  Nonfocal  Psych:  Normal affect   Labs    Chemistry  Recent Labs Lab 07/03/17 0543 07/04/17 0450 07/05/17 0426  NA 136 139 138  K 4.1 4.0 3.5  CL 105 108 105  CO2 25 23 24   GLUCOSE 136* 161* 272*  BUN 32* 21* 19  CREATININE 1.30* 1.18 1.30*  CALCIUM 8.7* 8.9 8.8*  GFRNONAA 58* >60 58*  GFRAA >60 >60 >60  ANIONGAP 6 8 9      Hematology  Recent Labs Lab 07/03/17 0543 07/04/17 0450 07/05/17 0426  WBC 4.8 5.6 4.5  RBC 3.61* 3.79* 3.74*  HGB 11.8* 12.5* 12.3*  HCT 34.6* 36.3* 35.7*  MCV 95.8 95.8 95.5  MCH 32.7 33.0 32.9  MCHC 34.1 34.4 34.5  RDW 13.4 13.3 13.3  PLT 199 215 215    Cardiac Enzymes  Recent Labs Lab 07/02/17 1939 07/03/17 0216 07/03/17 0543  TROPONINI <0.03 <0.03 <0.03     Recent Labs Lab 07/02/17 1217  TROPIPOC  0.02     BNP  Recent Labs Lab 07/02/17 1317  BNP 179.0*     DDimer No results for input(s): DDIMER in the last 168 hours.   Radiology    No results found.  Cardiac Studies     Patient Profile     61 y.o. male  With CAD / CABG Presents with presyncope and slow atrial flutter  Assessment & Plan    1.  Atrial flutter:   HR has increased a bit this am Will start Coreg 6.25 BID I think he should be able to be discharged soon He will likely need to see EP and be considered for Aflutter ablatoin   2. CAD :    No angina   3.  HTN:  BP is up a little.    We will see him in follow up   Contact info:  Delane Gingerhil Nahser [facility] [address], [address]  [phone]  Please ask for my nurse , Marcelino DusterMichelle.      Signed, Kristeen MissPhilip Nahser, MD  07/05/2017, 11:32 AM

## 2017-07-05 NOTE — Progress Notes (Signed)
Discharge home. Home discharge instruction given, no question verbalized. 

## 2017-07-08 ENCOUNTER — Encounter: Payer: Self-pay | Admitting: Student

## 2017-07-08 DIAGNOSIS — E119 Type 2 diabetes mellitus without complications: Secondary | ICD-10-CM | POA: Insufficient documentation

## 2017-07-08 DIAGNOSIS — I2510 Atherosclerotic heart disease of native coronary artery without angina pectoris: Secondary | ICD-10-CM | POA: Insufficient documentation

## 2017-07-09 ENCOUNTER — Inpatient Hospital Stay: Payer: BLUE CROSS/BLUE SHIELD | Admitting: Student

## 2017-07-11 ENCOUNTER — Encounter: Payer: Self-pay | Admitting: Student

## 2017-07-11 ENCOUNTER — Ambulatory Visit (INDEPENDENT_AMBULATORY_CARE_PROVIDER_SITE_OTHER): Payer: BLUE CROSS/BLUE SHIELD | Admitting: Student

## 2017-07-11 VITALS — BP 124/70 | HR 94 | Temp 98.9°F | Ht 76.0 in | Wt 322.8 lb

## 2017-07-11 DIAGNOSIS — I10 Essential (primary) hypertension: Secondary | ICD-10-CM

## 2017-07-11 DIAGNOSIS — Z23 Encounter for immunization: Secondary | ICD-10-CM

## 2017-07-11 DIAGNOSIS — Z5181 Encounter for therapeutic drug level monitoring: Secondary | ICD-10-CM

## 2017-07-11 DIAGNOSIS — I484 Atypical atrial flutter: Secondary | ICD-10-CM | POA: Diagnosis not present

## 2017-07-11 DIAGNOSIS — E1159 Type 2 diabetes mellitus with other circulatory complications: Secondary | ICD-10-CM | POA: Diagnosis not present

## 2017-07-11 DIAGNOSIS — Z09 Encounter for follow-up examination after completed treatment for conditions other than malignant neoplasm: Secondary | ICD-10-CM | POA: Diagnosis not present

## 2017-07-11 DIAGNOSIS — R609 Edema, unspecified: Secondary | ICD-10-CM

## 2017-07-11 DIAGNOSIS — I25119 Atherosclerotic heart disease of native coronary artery with unspecified angina pectoris: Secondary | ICD-10-CM

## 2017-07-11 MED ORDER — EMPAGLIFLOZIN 10 MG PO TABS: 10.0000 mg | ORAL_TABLET | Freq: Every day | ORAL | 0 refills | Status: DC

## 2017-07-11 NOTE — Assessment & Plan Note (Signed)
Tachycardic to 102 on my count but asymptomatic. Will continue coreg 6.25 mg twice a day and apixiban. He has follow up with cardiology soon.

## 2017-07-11 NOTE — Assessment & Plan Note (Signed)
Supposed to be on Janumet and Jardiance per his discharge summary but I don't see the later in his medication list.  Will start Jardiance today.

## 2017-07-11 NOTE — Assessment & Plan Note (Addendum)
Controlled. Noted that he is on Amlodipine presumably for hypertension. Will continue amlodipine for now. BMP today

## 2017-07-11 NOTE — Assessment & Plan Note (Signed)
Improved. He has trace edema. Unclear cause. CMP, CBC and TSH within normal limit. His echo with mod RAE but normal PA pressure. He has no cardiopulmonary symptoms but has history of CAD and atrial flutter. He is on Lasix.  Will check BMP today.

## 2017-07-11 NOTE — Assessment & Plan Note (Signed)
No cardiopulmonary symptoms today. On coreg, losartan, ASA and Plavix. Not sure about the need for Plavix at this time. He has been on this for long time. He is also not on statin due to intolerance in the past. He is interested in PSCK-9. Advised him to discuss this and further need of Plavix with cardiologist. He has an upcoming apt in three weeks.

## 2017-07-11 NOTE — Patient Instructions (Addendum)
It was great seeing you today! We have addressed the following issues today 1. Atrial flutter: Continue taking your medications. I suggest you discuss about PSCK-9 and your Plavix with your cardiologist.  2.  Diabetes: Continue taking the Janumet and Jiardiance. I also recommend looking into diet. See below for more information on diet 3. General checkup: please come back and see us in 2-3 weeks for you annual wellness visit.   If we did any lab work today, and the results require attention, either me or my nurse will get in touch with you. If everything is normal, you will get a letter in mail and a message via . If you don't hear from us in two weeks, please give us a call. Otherwise, we look forward to seeing you again at your next visit. If you have any questions or concerns before then, please call the clinic at [phone].  Please bring all your medications to every doctors visit  Sign up for My Chart to have easy access to your labs results, and communication with your Primary care physician.    Please check-out at the front desk before leaving the clinic.    Take Care,   Dr. Alanda SlimGonfa Portion Size    Choose healthier foods such as 100% whole grains, vegetables, fruits, beans, nut seeds, olive oil, most vegetable oils, fat-free dietary, wild game and fish.   Avoid sweet tea, other sweetened beverages, soda, fruit juice, cold cereal and milk and trans fat.   Eat at least 3 meals and 1-2 snacks per day.  Aim for no more than 5 hours between eating.  Eat breakfast within one hour of getting up.    Exercise at least 150 minutes per week, including weight resistance exercises 3 or 4 times per week.   Try to lose at least 7-10% of your current body weight.             Colon Cancer  People with early colon cancer usually have no warning signs or symptoms.  If found early, most patients can be cured, but if found when it has already spread, the chance of survival is not as  good.  Colon cancer is the second most common cause of concern is in the [place] with over 56,000 deaths from colon cancer in 2005  Colon cancer is a common, treatable disease. Screening tests can find a cancer  early, before you have symptoms, and make it more likely that you will survive the disease.  Who needs to be tested? If you are age 60-75 yrs, you should be tested for colon cancer.  Ways to be tested:  A colonoscopy the best test to detect colon cancer. It requires you to drink a bowel preparation to clean out your colon before the test. During this test, a tube with a camera inserted into your rectum and examines your entire colon. You can be given medicine to make you sleepy during the exam. Therefore, you will not be able to drive immediately after the test. There is a small risk of bowel injury during the test.   Stool cards that you can take home and take a sample of your stool is another option. The cards are not as good as colonoscopy at detecting cancer, but the tests are easier and cheaper.   To schedule the colonoscopy, you can call one of the 3 options below:  [facility]. Phone number: [phone]  [facility]. Phone number: [phone]  [facility]: Phone number [phone]

## 2017-07-11 NOTE — Progress Notes (Signed)
Subjective:    Steve [name] is a 61 y.o. old male here for hospital follow up.    HPI  Hospital follow up: patient was recently hospitalized for weakness and bradycardia and found to have atrial flutter with ?first degree AVB. Patient denies further dizzy spell, shortness of breath & chest pain. Reports palpitation last night but he fell sleep and doesn't remember how long it lasted. He reports compliance with his coreg and apixiban. He is also on Plavix for years. He says he had history of PCI in 2008 and CABG in 2011. He says he could not tolerated statin in the past due to myalgia. He doesn't remember which particular statin he tried. This was about 9 years ago. He is interested in PSCK-9.   Edema: reports history of leg swelling. He was discharged on lasix. Echo basically normal except for mod RAE but pulmonary pressure within normal limit. He was discharged on Lasix 40 mg daily. He has no cardiopulmonary symptoms today.   Diabetes: supposedly on Janument and Jardicance per his discharge summary but I don't see the latter on his medication list.   PMH/Problem List: has Atrial flutter (HCC); Bradycardia; CAD s/p CABG in 2011; Diabetes (HCC); Essential hypertension; and Edema on his problem list.   has a past medical history of Coronary artery disease; GERD (gastroesophageal reflux disease) (<10/02/2010); High cholesterol; History of blood transfusion (1979); History of hiatal hernia (<10/02/2010); Hypertension; Myocardial infarction [facility](HCC) (2008; 2011); and Type II diabetes mellitus (HCC).  FH:  Family History  Problem Relation Age of Onset  . CAD Mother   . CAD Father     SH Social History  Substance Use Topics  . Smoking status: Never Smoker  . Smokeless tobacco: Never Used  . Alcohol use 1.2 oz/week    1 Glasses of wine, 1 Shots of liquor per week     Comment: 07/02/2017 "I don't drink q wk"    Review of Systems Review of systems negative except for pertinent positives and negatives  in history of present illness above.     Objective:     Vitals:   07/11/17 0915  BP: 124/70  Pulse: 94  Temp: 98.9 F (37.2 C)  TempSrc: Oral  SpO2: 97%  Weight: (!) 322 lb 12.8 oz (146.4 kg)  Height: 6\' 4"  (1.93 m)    Physical Exam GEN: appears well,  apparent distress. Head: normocephalic and atraumatic  Eyes: conjunctiva without injection, sclera anicteric CVS: 102/min, RR, nl S1&S2, no murmurs, trace edema RESP: no IWOB, good air movement bilaterally, CTAB GI: obese, BS present & normal, soft, NTND MSK: no focal tenderness or notable swelling SKIN: no apparent skin lesion NEURO: alert and oiented appropriately, no gross deficits  PSYCH: euthymic mood with congruent affect    Assessment and Plan:  Atrial flutter (HCC) Tachycardic to 102 on my count but asymptomatic. Will continue coreg 6.25 mg twice a day and apixiban. He has follow up with cardiology soon.   CAD s/p CABG in 2011 No cardiopulmonary symptoms today. On coreg, losartan, ASA and Plavix. Not sure about the need for Plavix at this time. He has been on this for long time. He is also not on statin due to intolerance in the past. He is interested in PSCK-9. Advised him to discuss this and further need of Plavix with cardiologist. He has an upcoming apt in three weeks.  Essential hypertension Controlled. Noted that he is on Amlodipine presumably for hypertension. Will continue amlodipine for now. BMP today  Edema  Improved. He has trace edema. Unclear cause. CMP, CBC and TSH within normal limit. His echo with mod RAE but normal PA pressure. He has no cardiopulmonary symptoms but has history of CAD and atrial flutter. He is on Lasix.  Will check BMP today.   Diabetes (HCC) Supposed to be on Janumet and Jardiance per his discharge summary but I don't see the later in his medication list.  Will start Jardiance today.   Orders Placed This Encounter  Procedures  . Tdap vaccine greater than or equal to 7yo IM  .  Basic metabolic panel   Meds ordered this encounter  Medications  . empagliflozin (JARDIANCE) 10 MG TABS tablet    Sig: Take 10 mg by mouth daily.    Dispense:  90 tablet    Refill:  0   Return in about 3 weeks (around 08/01/2017) for Physical.   Patient signed release form to obtain his medical records from previous providers.   Almon Hercules, MD 07/11/17 Pager: [phone]

## 2017-07-12 ENCOUNTER — Encounter: Payer: Self-pay | Admitting: Student

## 2017-07-12 LAB — BASIC METABOLIC PANEL
BUN/Creatinine Ratio: 17 (ref 10–24)
BUN: 19 mg/dL (ref 8–27)
CO2: 22 mmol/L (ref 20–29)
Calcium: 9.6 mg/dL (ref 8.6–10.2)
Chloride: 100 mmol/L (ref 96–106)
Creatinine, Ser: 1.14 mg/dL (ref 0.76–1.27)
GFR calc Af Amer: 80 mL/min/{1.73_m2} (ref >59)
GFR calc non Af Amer: 70 mL/min/{1.73_m2} (ref >59)
Glucose: 289 mg/dL — ABNORMAL HIGH (ref 65–99)
Potassium: 4.4 mmol/L (ref 3.5–5.2)
Sodium: 138 mmol/L (ref 134–144)

## 2017-07-16 ENCOUNTER — Other Ambulatory Visit: Payer: Self-pay | Admitting: Student

## 2017-07-16 ENCOUNTER — Encounter: Payer: Self-pay | Admitting: Student

## 2017-07-16 DIAGNOSIS — E1159 Type 2 diabetes mellitus with other circulatory complications: Secondary | ICD-10-CM

## 2017-07-16 MED ORDER — GLUCOSE BLOOD VI STRP: ORAL_STRIP | 12 refills | Status: DC

## 2017-07-22 ENCOUNTER — Encounter: Payer: Self-pay | Admitting: Student

## 2017-07-22 DIAGNOSIS — G459 Transient cerebral ischemic attack, unspecified: Secondary | ICD-10-CM | POA: Insufficient documentation

## 2017-07-22 DIAGNOSIS — M5126 Other intervertebral disc displacement, lumbar region: Secondary | ICD-10-CM | POA: Insufficient documentation

## 2017-07-22 DIAGNOSIS — H35411 Lattice degeneration of retina, right eye: Secondary | ICD-10-CM | POA: Insufficient documentation

## 2017-07-22 DIAGNOSIS — R002 Palpitations: Secondary | ICD-10-CM | POA: Insufficient documentation

## 2017-07-22 DIAGNOSIS — H35033 Hypertensive retinopathy, bilateral: Secondary | ICD-10-CM | POA: Insufficient documentation

## 2017-07-22 DIAGNOSIS — E785 Hyperlipidemia, unspecified: Secondary | ICD-10-CM | POA: Insufficient documentation

## 2017-07-22 DIAGNOSIS — H43813 Vitreous degeneration, bilateral: Secondary | ICD-10-CM | POA: Insufficient documentation

## 2017-07-22 DIAGNOSIS — M722 Plantar fascial fibromatosis: Secondary | ICD-10-CM | POA: Insufficient documentation

## 2017-07-29 ENCOUNTER — Ambulatory Visit (INDEPENDENT_AMBULATORY_CARE_PROVIDER_SITE_OTHER): Payer: BLUE CROSS/BLUE SHIELD | Admitting: Physician Assistant

## 2017-07-29 ENCOUNTER — Encounter: Payer: Self-pay | Admitting: Physician Assistant

## 2017-07-29 VITALS — BP 144/90 | HR 59 | Ht 76.0 in | Wt 325.0 lb

## 2017-07-29 DIAGNOSIS — E119 Type 2 diabetes mellitus without complications: Secondary | ICD-10-CM | POA: Diagnosis not present

## 2017-07-29 DIAGNOSIS — I25700 Atherosclerosis of coronary artery bypass graft(s), unspecified, with unstable angina pectoris: Secondary | ICD-10-CM | POA: Diagnosis not present

## 2017-07-29 DIAGNOSIS — I10 Essential (primary) hypertension: Secondary | ICD-10-CM | POA: Diagnosis not present

## 2017-07-29 DIAGNOSIS — E785 Hyperlipidemia, unspecified: Secondary | ICD-10-CM

## 2017-07-29 DIAGNOSIS — I4892 Unspecified atrial flutter: Secondary | ICD-10-CM

## 2017-07-29 NOTE — Patient Instructions (Signed)
Medication Instructions:  STOP Plavix  Labwork: Your physician recommends that you return for lab work in: [employer], BMET, PT/INR  Testing/Procedures: Your physician has recommended that you have a Cardioversion (DCCV). Electrical Cardioversion uses a jolt of electricity to your heart either through paddles or wired patches attached to your chest. This is a controlled, usually prescheduled, procedure. Defibrillation is done under light anesthesia in the hospital, and you usually go home the day of the procedure. This is done to get your heart back into a normal rhythm. You are not awake for the procedure. Please see the instruction sheet given to you today.   Follow-Up: Your physician recommends that you schedule a follow-up appointment in:2 WEEKS WITH NAHSER OR VIN Your physician recommends that you schedule a follow-up appointment in: IN [facility] TO DISCUSS PSK9 ON THE SAME DAY AS FOLLOW UP APPOINTMENT   Any Other Special Instructions Will Be Listed Below (If Applicable).   Electrical Cardioversion Electrical cardioversion is the delivery of a jolt of electricity to restore a normal rhythm to the heart. A rhythm that is too fast or is not regular keeps the heart from pumping well. In this procedure, sticky patches or metal paddles are placed on the chest to deliver electricity to the heart from a device. This procedure may be done in an emergency if:  There is low or no blood pressure as a result of the heart rhythm.  Normal rhythm must be restored as fast as possible to protect the brain and heart from further damage.  It may save a life.  This procedure may also be done for irregular or fast heart rhythms that are not immediately life-threatening. Tell a health care provider about:  Any allergies you have.  All medicines you are taking, including vitamins, herbs, eye drops, creams, and over-the-counter medicines.  Any problems you or family members have had with anesthetic  medicines.  Any blood disorders you have.  Any surgeries you have had.  Any medical conditions you have.  Whether you are pregnant or may be pregnant. What are the risks? Generally, this is a safe procedure. However, problems may occur, including:  Allergic reactions to medicines.  A blood clot that breaks free and travels to other parts of your body.  The possible return of an abnormal heart rhythm within hours or days after the procedure.  Your heart stopping (cardiac arrest). This is rare.  What happens before the procedure? Medicines  Your health care provider may have you start taking: ? Blood-thinning medicines (anticoagulants) so your blood does not clot as easily. ? Medicines may be given to help stabilize your heart rate and rhythm.  Ask your health care provider about changing or stopping your regular medicines. This is especially important if you are taking diabetes medicines or blood thinners. General instructions  Plan to have someone take you home from the hospital or clinic.  If you will be going home right after the procedure, plan to have someone with you for 24 hours.  Follow instructions from your health care provider about eating or drinking restrictions. What happens during the procedure?  To lower your risk of infection: ? Your health care team will wash or sanitize their hands. ? Your skin will be washed with soap.  An IV tube will be inserted into one of your veins.  You will be given a medicine to help you relax (sedative).  Sticky patches (electrodes) or metal paddles may be placed on your chest.  An electrical  shock will be delivered. The procedure may vary among health care providers and hospitals. What happens after the procedure?  Your blood pressure, heart rate, breathing rate, and blood oxygen level will be monitored until the medicines you were given have worn off.  Do not drive for 24 hours if you were given a sedative.  Your  heart rhythm will be watched to make sure it does not change. This information is not intended to replace advice given to you by your health care provider. Make sure you discuss any questions you have with your health care provider. Document Released: 11/09/2002 Document Revised: 07/18/2016 Document Reviewed: 05/25/2016 Elsevier Interactive Patient Education  2017 [employer].    If you need a refill on your cardiac medications before your next appointment, please call your pharmacy.
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
## 2017-07-30 ENCOUNTER — Encounter: Payer: Self-pay | Admitting: Student

## 2017-07-30 ENCOUNTER — Ambulatory Visit (INDEPENDENT_AMBULATORY_CARE_PROVIDER_SITE_OTHER): Payer: BLUE CROSS/BLUE SHIELD | Admitting: Student

## 2017-07-30 VITALS — BP 124/66 | HR 52 | Temp 97.6°F | Ht 76.0 in | Wt 321.8 lb

## 2017-07-30 DIAGNOSIS — Z1159 Encounter for screening for other viral diseases: Secondary | ICD-10-CM | POA: Diagnosis not present

## 2017-07-30 DIAGNOSIS — Z0000 Encounter for general adult medical examination without abnormal findings: Secondary | ICD-10-CM | POA: Diagnosis not present

## 2017-07-30 DIAGNOSIS — Z23 Encounter for immunization: Secondary | ICD-10-CM | POA: Diagnosis not present

## 2017-07-30 DIAGNOSIS — N529 Male erectile dysfunction, unspecified: Secondary | ICD-10-CM

## 2017-07-30 DIAGNOSIS — Z114 Encounter for screening for human immunodeficiency virus [HIV]: Secondary | ICD-10-CM | POA: Diagnosis not present

## 2017-07-30 LAB — CBC
Hematocrit: 38.2 % (ref 37.5–51.0)
Hemoglobin: 12.9 g/dL — ABNORMAL LOW (ref 13.0–17.7)
MCH: 33.8 pg — ABNORMAL HIGH (ref 26.6–33.0)
MCHC: 33.8 g/dL (ref 31.5–35.7)
MCV: 100 fL — ABNORMAL HIGH (ref 79–97)
Platelets: 271 10*3/uL (ref 150–379)
RBC: 3.82 x10E6/uL — ABNORMAL LOW (ref 4.14–5.80)
RDW: 14.7 % (ref 12.3–15.4)
WBC: 6.2 10*3/uL (ref 3.4–10.8)

## 2017-07-30 LAB — BASIC METABOLIC PANEL
BUN/Creatinine Ratio: 20 (ref 10–24)
BUN: 26 mg/dL (ref 8–27)
CO2: 21 mmol/L (ref 20–29)
Calcium: 9.8 mg/dL (ref 8.6–10.2)
Chloride: 103 mmol/L (ref 96–106)
Creatinine, Ser: 1.32 mg/dL — ABNORMAL HIGH (ref 0.76–1.27)
GFR calc Af Amer: 67 mL/min/{1.73_m2} (ref >59)
GFR calc non Af Amer: 58 mL/min/{1.73_m2} — ABNORMAL LOW (ref >59)
Glucose: 116 mg/dL — ABNORMAL HIGH (ref 65–99)
Potassium: 4.3 mmol/L (ref 3.5–5.2)
Sodium: 143 mmol/L (ref 134–144)

## 2017-07-30 LAB — PROTIME-INR
INR: 1 (ref 0.8–1.2)
Prothrombin Time: 10.4 s (ref 9.1–12.0)

## 2017-07-30 MED ORDER — ZOSTER VAC RECOMB ADJUVANTED 50 MCG/0.5ML IM SUSR: 0.5000 mL | Freq: Once | INTRAMUSCULAR | 1 refills | Status: AC
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
## 2017-07-30 NOTE — Patient Instructions (Addendum)
It was great seeing you today! We have addressed the following issues today  1. Weight: your BMI is 39. I strongly recommend some lifestyle changes including exercise such as water aerobics, and diet to lose some weight. We also have the nutritionist in our clinic if you are interested. See below for some tips on diet and exercise 2. Screening for colon cancer: is very important that you get a colonoscopy done. Please call one of the number below to schedule for this.    If we did any lab work today, and the results require attention, either me or my nurse will get in touch with you. If everything is normal, you will get a letter in mail and a message via . If you don't hear from [place] in two weeks, please give [place] a call. Otherwise, we look forward to seeing you again at your next visit. If you have any questions or concerns before then, please call the clinic at [phone].  Please bring all your medications to every doctors visit  Sign up for My Chart to have easy access to your labs results, and communication with your Primary care physician.    Please check-out at the front desk before leaving the clinic.    Take Care,   Dr. Alanda Slim  Portion Size    Choose healthier foods such as 100% whole grains, vegetables, fruits, beans, nut seeds, olive oil, most vegetable oils, fat-free dietary, wild game and fish.   Avoid sweet tea, other sweetened beverages, soda, fruit juice, cold cereal and milk and trans fat.   Eat at least 3 meals and 1-2 snacks per day.  Aim for no more than 5 hours between eating.  Eat breakfast within one hour of getting up.    Exercise at least 150 minutes per week, including weight resistance exercises 3 or 4 times per week.   Try to lose at least 7-10% of your current body weight.             Colon Cancer  People with early colon cancer usually have no warning signs or symptoms.  If found early, most patients can be cured, but if found when it has  already spread, the chance of survival is not as good.  Colon cancer is the second most common cause of concern is in the [place] with over 56,000 deaths from colon cancer in 2005  Colon cancer is a common, treatable disease. Screening tests can find a cancer  early, before you have symptoms, and make it more likely that you will survive the disease.  Who needs to be tested? If you are age 62-75 yrs, you should be tested for colon cancer.  Ways to be tested:  A colonoscopy the best test to detect colon cancer. It requires you to drink a bowel preparation to clean out your colon before the test. During this test, a tube with a camera inserted into your rectum and examines your entire colon. You can be given medicine to make you sleepy during the exam. Therefore, you will not be able to drive immediately after the test. There is a small risk of bowel injury during the test.   Stool cards that you can take home and take a sample of your stool is another option. The cards are not as good as colonoscopy at detecting cancer, but the tests are easier and cheaper.   To schedule the colonoscopy, you can call one of the 3 options below:  [facility]. Phone number:  [phone]  [facility]. Phone number: [phone]  [facility]: Phone number [phone]

## 2017-07-31 ENCOUNTER — Encounter: Payer: Self-pay | Admitting: Student

## 2017-07-31 ENCOUNTER — Other Ambulatory Visit: Payer: Self-pay | Admitting: Family Medicine

## 2017-07-31 DIAGNOSIS — I10 Essential (primary) hypertension: Secondary | ICD-10-CM

## 2017-07-31 DIAGNOSIS — I484 Atypical atrial flutter: Secondary | ICD-10-CM

## 2017-07-31 DIAGNOSIS — N529 Male erectile dysfunction, unspecified: Secondary | ICD-10-CM | POA: Insufficient documentation

## 2017-07-31 DIAGNOSIS — I25119 Atherosclerotic heart disease of native coronary artery with unspecified angina pectoris: Secondary | ICD-10-CM

## 2017-07-31 LAB — HIV ANTIBODY (ROUTINE TESTING W REFLEX): HIV Screen 4th Generation wRfx: NONREACTIVE

## 2017-07-31 LAB — TESTOSTERONE FREE, PROFILE I
Albumin: 4.7 g/dL (ref 3.6–4.8)
Sex Hormone Binding: 28.1 nmol/L (ref 19.3–76.4)
Testost., Free, Calc: 67.1 pg/mL (ref 35.8–168.2)
Testosterone: 324 ng/dL (ref 264–916)

## 2017-07-31 LAB — HEPATITIS C ANTIBODY: Hep C Virus Ab: <0.1 s/co ratio (ref 0.0–0.9)

## 2017-07-31 LAB — TSH: TSH: 2.01 u[IU]/mL (ref 0.450–4.500)

## 2017-08-02 ENCOUNTER — Ambulatory Visit (HOSPITAL_COMMUNITY): Payer: BLUE CROSS/BLUE SHIELD | Admitting: Certified Registered Nurse Anesthetist

## 2017-08-02 ENCOUNTER — Ambulatory Visit (HOSPITAL_COMMUNITY)
Admission: RE | Admit: 2017-08-02 | Discharge: 2017-08-02 | Disposition: A | Discharge status: Home / Self Care | Payer: BLUE CROSS/BLUE SHIELD | Source: Ambulatory Visit | Attending: Cardiovascular Disease | Admitting: Cardiovascular Disease

## 2017-08-02 ENCOUNTER — Encounter (HOSPITAL_COMMUNITY)
Admission: RE | Disposition: A | Discharge status: Home / Self Care | Payer: Self-pay | Source: Ambulatory Visit | Attending: Cardiovascular Disease

## 2017-08-02 ENCOUNTER — Encounter (HOSPITAL_COMMUNITY): Payer: Self-pay | Admitting: Cardiovascular Disease

## 2017-08-02 DIAGNOSIS — Z7902 Long term (current) use of antithrombotics/antiplatelets: Secondary | ICD-10-CM | POA: Diagnosis not present

## 2017-08-02 DIAGNOSIS — Z9889 Other specified postprocedural states: Secondary | ICD-10-CM | POA: Insufficient documentation

## 2017-08-02 DIAGNOSIS — I10 Essential (primary) hypertension: Secondary | ICD-10-CM | POA: Insufficient documentation

## 2017-08-02 DIAGNOSIS — K219 Gastro-esophageal reflux disease without esophagitis: Secondary | ICD-10-CM | POA: Diagnosis not present

## 2017-08-02 DIAGNOSIS — I2510 Atherosclerotic heart disease of native coronary artery without angina pectoris: Secondary | ICD-10-CM | POA: Insufficient documentation

## 2017-08-02 DIAGNOSIS — Z955 Presence of coronary angioplasty implant and graft: Secondary | ICD-10-CM | POA: Diagnosis not present

## 2017-08-02 DIAGNOSIS — Z79899 Other long term (current) drug therapy: Secondary | ICD-10-CM | POA: Insufficient documentation

## 2017-08-02 DIAGNOSIS — Z7982 Long term (current) use of aspirin: Secondary | ICD-10-CM | POA: Diagnosis not present

## 2017-08-02 DIAGNOSIS — E119 Type 2 diabetes mellitus without complications: Secondary | ICD-10-CM | POA: Insufficient documentation

## 2017-08-02 DIAGNOSIS — Z7901 Long term (current) use of anticoagulants: Secondary | ICD-10-CM | POA: Diagnosis not present

## 2017-08-02 DIAGNOSIS — I252 Old myocardial infarction: Secondary | ICD-10-CM | POA: Insufficient documentation

## 2017-08-02 DIAGNOSIS — N529 Male erectile dysfunction, unspecified: Secondary | ICD-10-CM | POA: Insufficient documentation

## 2017-08-02 DIAGNOSIS — E7800 Pure hypercholesterolemia, unspecified: Secondary | ICD-10-CM | POA: Insufficient documentation

## 2017-08-02 DIAGNOSIS — I4891 Unspecified atrial fibrillation: Secondary | ICD-10-CM | POA: Insufficient documentation

## 2017-08-02 DIAGNOSIS — I272 Pulmonary hypertension, unspecified: Secondary | ICD-10-CM | POA: Diagnosis not present

## 2017-08-02 DIAGNOSIS — Z951 Presence of aortocoronary bypass graft: Secondary | ICD-10-CM | POA: Insufficient documentation

## 2017-08-02 DIAGNOSIS — I4892 Unspecified atrial flutter: Secondary | ICD-10-CM | POA: Insufficient documentation

## 2017-08-02 DIAGNOSIS — I484 Atypical atrial flutter: Secondary | ICD-10-CM

## 2017-08-02 HISTORY — PX: CARDIOVERSION: SHX1299

## 2017-08-02 LAB — GLUCOSE, CAPILLARY: Glucose-Capillary: 148 mg/dL — ABNORMAL HIGH (ref 65–99)

## 2017-08-02 SURGERY — CARDIOVERSION
Anesthesia: General

## 2017-08-02 MED ORDER — SODIUM CHLORIDE 0.9 % IV SOLN
INTRAVENOUS | Status: DC
  Administered 2017-08-02: 14:00:00 via INTRAVENOUS
  Administered 2017-08-02: 500 mL via INTRAVENOUS

## 2017-08-02 MED ORDER — PROPOFOL 10 MG/ML IV BOLUS
INTRAVENOUS | Status: DC | PRN
  Administered 2017-08-02: 100 mg via INTRAVENOUS
  Administered 2017-08-02: 50 mg via INTRAVENOUS

## 2017-08-02 MED ORDER — LIDOCAINE HCL (CARDIAC) 20 MG/ML IV SOLN
INTRAVENOUS | Status: DC | PRN
  Administered 2017-08-02: 20 mg via INTRAVENOUS

## 2017-08-02 NOTE — Anesthesia Preprocedure Evaluation (Signed)
Anesthesia Evaluation  Patient identified by MRN, date of birth, ID band Patient awake    Reviewed: Allergy & Precautions, NPO status , Patient's Chart, lab work & pertinent test results  Airway Mallampati: II  TM Distance: >3 FB Neck ROM: Full    Dental  (+) Dental Advisory Given   Pulmonary neg pulmonary ROS,    breath sounds clear to auscultation       Cardiovascular hypertension, Pt. on medications and Pt. on home beta blockers + CAD, + Past MI and + CABG   Rhythm:Regular Rate:Normal     Neuro/Psych negative neurological ROS     GI/Hepatic Neg liver ROS, hiatal hernia, GERD  ,  Endo/Other  diabetes, Type 2  Renal/GU negative Renal ROS     Musculoskeletal   Abdominal   Peds  Hematology negative hematology ROS (+)   Anesthesia Other Findings   Reproductive/Obstetrics                             Anesthesia Physical Anesthesia Plan  ASA: III  Anesthesia Plan: General   Post-op Pain Management:    Induction: Intravenous  PONV Risk Score and Plan: 2 and Ondansetron, Propofol infusion and Treatment may vary due to age or medical condition  Airway Management Planned: Natural Airway and Mask  Additional Equipment:   Intra-op Plan:   Post-operative Plan:   Informed Consent: I have reviewed the patients History and Physical, chart, labs and discussed the procedure including the risks, benefits and alternatives for the proposed anesthesia with the patient or authorized representative who has indicated his/her understanding and acceptance.     Plan Discussed with: CRNA  Anesthesia Plan Comments:         Anesthesia Quick Evaluation

## 2017-08-02 NOTE — Discharge Instructions (Signed)
Electrical Cardioversion, Care After °This sheet gives you information about how to care for yourself after your procedure. Your health care provider may also give you more specific instructions. If you have problems or questions, contact your health care provider. °What can I expect after the procedure? °After the procedure, it is common to have: °· Some redness on the skin where the shocks were given. ° °Follow these instructions at home: °· Do not drive for 24 hours if you were given a medicine to help you relax (sedative). °· Take over-the-counter and prescription medicines only as told by your health care provider. °· Ask your health care provider how to check your pulse. Check it often. °· Rest for 48 hours after the procedure or as told by your health care provider. °· Avoid or limit your caffeine use as told by your health care provider. °Contact a health care provider if: °· You feel like your heart is beating too quickly or your pulse is not regular. °· You have a serious muscle cramp that does not go away. °Get help right away if: °· You have discomfort in your chest. °· You are dizzy or you feel faint. °· You have trouble breathing or you are short of breath. °· Your speech is slurred. °· You have trouble moving an arm or leg on one side of your body. °· Your fingers or toes turn cold or blue. °This information is not intended to replace advice given to you by your health care provider. Make sure you discuss any questions you have with your health care provider. °Document Released: 09/09/2013 Document Revised: 06/22/2016 Document Reviewed: 05/25/2016 °Elsevier Interactive Patient Education © 2018 Elsevier Inc. ° °
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
## 2017-08-02 NOTE — Interval H&P Note (Signed)
History and Physical Interval Note:  08/02/2017 1:34 PM  Steve RuizJohn [name]  has presented today for surgery, with the diagnosis of ATRIAL FLUTTER  The various methods of treatment have been discussed with the patient and family. After consideration of risks, benefits and other options for treatment, the patient has consented to  Procedure(s): CARDIOVERSION (N/A) as a surgical intervention .  The patient's history has been reviewed, patient examined, no change in status, stable for surgery.  I have reviewed the patient's chart and labs.  Questions were answered to the patient's satisfaction.     Chilton Siiffany [place], MD

## 2017-08-02 NOTE — Anesthesia Postprocedure Evaluation (Signed)
Anesthesia Post Note  Patient: Heron NayJohn M Lottes  Procedure(s) Performed: Procedure(s) (LRB): CARDIOVERSION (N/A)     Patient location during evaluation: PACU Anesthesia Type: General Level of consciousness: awake and alert Pain management: pain level controlled Vital Signs Assessment: post-procedure vital signs reviewed and stable Respiratory status: spontaneous breathing, nonlabored ventilation, respiratory function stable and patient connected to nasal cannula oxygen Cardiovascular status: blood pressure returned to baseline and stable Postop Assessment: no signs of nausea or vomiting Anesthetic complications: no    Last Vitals:  Vitals:   08/02/17 1400 08/02/17 1410  BP: (!) 142/89 140/79  Pulse: 77 76  Resp: 17 14  Temp:    SpO2: 100% 99%    Last Pain:  Vitals:   08/02/17 1306  TempSrc: Oral                 Kennieth RadFitzgerald, Robert E

## 2017-08-02 NOTE — Transfer of Care (Signed)
Immediate Anesthesia Transfer of Care Note  Patient: Steve [name]  Procedure(s) Performed: Procedure(s): CARDIOVERSION (N/A)  Patient Location: Endoscopy Unit  Anesthesia Type:General  Level of Consciousness: awake, alert  and oriented  Airway & Oxygen Therapy: Patient Spontanous Breathing and Patient connected to nasal cannula oxygen  Post-op Assessment: Report given to RN and Post -op Vital signs reviewed and stable  Post vital signs: Reviewed and stable  Last Vitals:  Vitals:   08/02/17 1306  BP: 133/73  Pulse: (!) 54  Resp: 19  Temp: 36.5 C  SpO2: 97%    Last Pain:  Vitals:   08/02/17 1306  TempSrc: Oral         Complications: No apparent anesthesia complications

## 2017-08-02 NOTE — CV Procedure (Signed)
Electrical Cardioversion Procedure Note Steve [name] [id number] August 02, 1956  Procedure: Electrical Cardioversion Indications:  Atrial Flutter  Procedure Details Consent: Risks of procedure as well as the alternatives and risks of each were explained to the (patient/caregiver).  Consent for procedure obtained. Time Out: Verified patient identification, verified procedure, site/side was marked, verified correct patient position, special equipment/implants available, medications/allergies/relevent history reviewed, required imaging and test results available.  Performed  Patient placed on cardiac monitor, pulse oximetry, supplemental oxygen as necessary.  Sedation given: propfol Pacer pads placed anterior and posterior chest.  Cardioverted 1 time(s).  Cardioverted at 150J.  Evaluation Findings: Post procedure EKG shows: NSR Complications: None Patient did tolerate procedure well.   Chilton Siiffany [place], MD 08/02/2017, 1:44 PM

## 2017-08-04 ENCOUNTER — Encounter (HOSPITAL_COMMUNITY): Payer: Self-pay | Admitting: Cardiovascular Disease

## 2017-08-12 ENCOUNTER — Other Ambulatory Visit: Payer: Self-pay | Admitting: *Deleted

## 2017-08-13 ENCOUNTER — Ambulatory Visit (INDEPENDENT_AMBULATORY_CARE_PROVIDER_SITE_OTHER): Payer: BLUE CROSS/BLUE SHIELD | Admitting: Cardiovascular Disease

## 2017-08-13 ENCOUNTER — Encounter: Payer: Self-pay | Admitting: Cardiovascular Disease

## 2017-08-13 ENCOUNTER — Ambulatory Visit (INDEPENDENT_AMBULATORY_CARE_PROVIDER_SITE_OTHER): Payer: BLUE CROSS/BLUE SHIELD | Admitting: Pharmacist

## 2017-08-13 DIAGNOSIS — I10 Essential (primary) hypertension: Secondary | ICD-10-CM

## 2017-08-13 DIAGNOSIS — I484 Atypical atrial flutter: Secondary | ICD-10-CM

## 2017-08-13 DIAGNOSIS — I25119 Atherosclerotic heart disease of native coronary artery with unspecified angina pectoris: Secondary | ICD-10-CM | POA: Diagnosis not present

## 2017-08-13 DIAGNOSIS — E785 Hyperlipidemia, unspecified: Secondary | ICD-10-CM

## 2017-08-13 MED ORDER — POTASSIUM CHLORIDE ER 10 MEQ PO CPCR: 10.0000 meq | ORAL_CAPSULE | Freq: Every day | ORAL | 3 refills | Status: DC

## 2017-08-13 MED ORDER — APIXABAN 5 MG PO TABS: 5.0000 mg | ORAL_TABLET | Freq: Two times a day (BID) | ORAL | 3 refills | Status: DC

## 2017-08-13 MED ORDER — FUROSEMIDE 20 MG PO TABS: 40.0000 mg | ORAL_TABLET | Freq: Every day | ORAL | 5 refills | Status: DC

## 2017-08-13 MED ORDER — LOSARTAN POTASSIUM 50 MG PO TABS: 50.0000 mg | ORAL_TABLET | Freq: Every day | ORAL | 3 refills | Status: DC

## 2017-08-13 MED ORDER — CARVEDILOL 6.25 MG PO TABS: ORAL_TABLET | ORAL | 3 refills | Status: DC

## 2017-08-13 MED ORDER — ROSUVASTATIN CALCIUM 5 MG PO TABS: 5.0000 mg | ORAL_TABLET | Freq: Every day | ORAL | 11 refills | Status: DC

## 2017-08-13 MED ORDER — AMLODIPINE BESYLATE 10 MG PO TABS: 10.0000 mg | ORAL_TABLET | Freq: Every day | ORAL | 3 refills | Status: DC

## 2017-08-13 NOTE — Patient Instructions (Addendum)
Medication Instructions:  STOP Cardura (Doxazosin) STOP Aspirin  Labwork: None Ordered   Testing/Procedures: None Ordered   Follow-Up: Your physician wants you to follow-up in: 6 months with Dr. Elease HashimotoNahser.  You will receive a reminder letter in the mail two months in advance. If you don't receive a letter, please call our office to schedule the follow-up appointment.   If you need a refill on your cardiac medications before your next appointment, please call your pharmacy.   Thank you for choosing [facility]! Eligha BridegroomMichelle Swinyer, RN [phone]
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
## 2017-08-13 NOTE — Patient Instructions (Addendum)
It was nice to meet you today  Start taking Crestor (rosuvastatin) 5mg  once a day for your cholesterol  Your LDL goal is < 70  Call Megan in the lipid clinic with any side effects or concerns [phone]  Recheck cholesterol on Monday, December 10th. Come in any time after 7:30am for fasting lab work.

## 2017-08-21 ENCOUNTER — Encounter: Payer: Self-pay | Admitting: Student

## 2017-08-22 ENCOUNTER — Ambulatory Visit
Admission: RE | Admit: 2017-08-22 | Discharge: 2017-08-22 | Disposition: A | Discharge status: Home / Self Care | Payer: BC Managed Care – PPO | Source: Ambulatory Visit | Attending: Orthopaedic Surgery | Admitting: Orthopaedic Surgery

## 2017-08-22 ENCOUNTER — Other Ambulatory Visit
Admission: RE | Admit: 2017-08-22 | Discharge: 2017-08-22 | Disposition: A | Discharge status: Home / Self Care | Payer: Self-pay | Source: Ambulatory Visit | Attending: Orthopaedic Surgery | Admitting: Orthopaedic Surgery

## 2017-08-22 LAB — HEMOGLOBIN A1C: Hgb A1C, %: 7.6 %

## 2017-08-26 ENCOUNTER — Encounter: Payer: Self-pay | Admitting: Student

## 2017-08-28 ENCOUNTER — Encounter: Payer: Self-pay | Admitting: Nurse Practitioner

## 2017-09-11 ENCOUNTER — Telehealth: Payer: Self-pay | Admitting: Cardiovascular Disease

## 2017-09-11 NOTE — Telephone Encounter (Signed)
Called pharmacy and they stated that they refilled pt's medication, pt taking potassium BID, but pt only take it daily. Pharmacy stated that they corrected this and is notifying pt concerning this issue. I inform the pharmacy that if they have any other problems, questions or concerns, please call our office. Pharmacy verbalized understanding.

## 2017-09-11 NOTE — Telephone Encounter (Signed)
New Message  Annice Pih from [facility] call to inform there was a mistake on filling pts potassium in September. She states the correct was make to the medications. Please call back to discuss

## 2017-09-12 ENCOUNTER — Encounter: Payer: Self-pay | Admitting: Student

## 2017-09-12 ENCOUNTER — Ambulatory Visit (INDEPENDENT_AMBULATORY_CARE_PROVIDER_SITE_OTHER): Payer: BLUE CROSS/BLUE SHIELD | Admitting: Student

## 2017-09-12 VITALS — BP 140/80 | HR 80 | Temp 98.2°F | Ht 76.0 in | Wt 292.2 lb

## 2017-09-12 DIAGNOSIS — I10 Essential (primary) hypertension: Secondary | ICD-10-CM

## 2017-09-12 DIAGNOSIS — B354 Tinea corporis: Secondary | ICD-10-CM | POA: Diagnosis not present

## 2017-09-12 DIAGNOSIS — L821 Other seborrheic keratosis: Secondary | ICD-10-CM

## 2017-09-12 DIAGNOSIS — E1159 Type 2 diabetes mellitus with other circulatory complications: Secondary | ICD-10-CM

## 2017-09-12 LAB — POCT GLYCOSYLATED HEMOGLOBIN (HGB A1C): Hemoglobin A1C: 7.4

## 2017-09-12 MED ORDER — AMLODIPINE BESYLATE 5 MG PO TABS: 5.0000 mg | ORAL_TABLET | Freq: Two times a day (BID) | ORAL | 3 refills | Status: DC

## 2017-09-12 MED ORDER — GLIPIZIDE 10 MG PO TABS: 10.0000 mg | ORAL_TABLET | Freq: Two times a day (BID) | ORAL | 0 refills | Status: DC

## 2017-09-12 NOTE — Patient Instructions (Signed)
It was great seeing you today! We have addressed the following issues today 1. Diabetes: we have added glipizide 10 mg twice a day. Continue taking your other medications.  2.   Hypertension: I suggest you discuss about your medications with your cardiologist. It may be beneficial to increase the dose on carvedilol or losartan if you don't like amlodipine 3.  Skin rash: I suggest trying hydrocortisone cream twice a day for 2 weeks and see if this improvement. If not, please come back and see [place]. We may consider another treatment.   If we did any lab work today, and the results require attention, either me or my nurse will get in touch with you. If everything is normal, you will get a letter in mail and a message via . If you don't hear from [place] in two weeks, please give [place] a call. Otherwise, we look forward to seeing you again at your next visit. If you have any questions or concerns before then, please call the clinic at [phone].  Please bring all your medications to every doctors visit  Sign up for My Chart to have easy access to your labs results, and communication with your Primary care physician.    Please check-out at the front desk before leaving the clinic.    Take Care,   Dr. Alanda Slim
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
## 2017-09-15 DIAGNOSIS — B354 Tinea corporis: Secondary | ICD-10-CM | POA: Insufficient documentation

## 2017-10-05 ENCOUNTER — Encounter (HOSPITAL_COMMUNITY): Payer: Self-pay | Admitting: *Deleted

## 2017-10-05 ENCOUNTER — Emergency Department (HOSPITAL_COMMUNITY)
Admission: EM | Admit: 2017-10-05 | Discharge: 2017-10-06 | Disposition: A | Discharge status: Home / Self Care | Payer: BLUE CROSS/BLUE SHIELD | Attending: Emergency Medicine | Admitting: Emergency Medicine

## 2017-10-05 DIAGNOSIS — E119 Type 2 diabetes mellitus without complications: Secondary | ICD-10-CM | POA: Diagnosis not present

## 2017-10-05 DIAGNOSIS — Z951 Presence of aortocoronary bypass graft: Secondary | ICD-10-CM | POA: Diagnosis not present

## 2017-10-05 DIAGNOSIS — Z7984 Long term (current) use of oral hypoglycemic drugs: Secondary | ICD-10-CM | POA: Diagnosis not present

## 2017-10-05 DIAGNOSIS — L03116 Cellulitis of left lower limb: Secondary | ICD-10-CM | POA: Diagnosis not present

## 2017-10-05 DIAGNOSIS — Z79899 Other long term (current) drug therapy: Secondary | ICD-10-CM | POA: Diagnosis not present

## 2017-10-05 DIAGNOSIS — I2510 Atherosclerotic heart disease of native coronary artery without angina pectoris: Secondary | ICD-10-CM | POA: Diagnosis not present

## 2017-10-05 DIAGNOSIS — Z7901 Long term (current) use of anticoagulants: Secondary | ICD-10-CM | POA: Insufficient documentation

## 2017-10-05 DIAGNOSIS — I10 Essential (primary) hypertension: Secondary | ICD-10-CM | POA: Diagnosis not present

## 2017-10-05 DIAGNOSIS — R2242 Localized swelling, mass and lump, left lower limb: Secondary | ICD-10-CM | POA: Diagnosis present

## 2017-10-05 NOTE — ED Triage Notes (Signed)
The pt is c/o his lt ankle and lower leg swollen since Tuesday.  He woke up with the swelling on Tuesday.  No known injury some swelling just above thew ankle  Just noticed tonight

## 2017-10-06 ENCOUNTER — Emergency Department (HOSPITAL_COMMUNITY): Payer: BLUE CROSS/BLUE SHIELD

## 2017-10-06 MED ORDER — CEPHALEXIN 500 MG PO CAPS: 500.0000 mg | ORAL_CAPSULE | Freq: Four times a day (QID) | ORAL | 0 refills | Status: DC

## 2017-10-06 MED ORDER — CEPHALEXIN 250 MG PO CAPS
500.0000 mg | ORAL_CAPSULE | Freq: Once | ORAL | Status: AC
  Administered 2017-10-06: 500 mg via ORAL
  Filled 2017-10-06: qty 2

## 2017-10-06 MED ORDER — DOXYCYCLINE HYCLATE 100 MG PO CAPS: 100.0000 mg | ORAL_CAPSULE | Freq: Two times a day (BID) | ORAL | 0 refills | Status: DC

## 2017-10-06 NOTE — Discharge Instructions (Signed)
We suspect that you have cellulitis. Please start taking the antibiotics prescribed. Please start taking doxycycline if not getting better despite Keflex. See your doctor in 1 week  Return to the ER if your symptoms are getting worse, i.e. rash is progressing towards the thighs.  We doubt that you have a blood clot in the legs given that you are on a blood thinner.  However if not getting better despite antibiotics please ask your primary care doctor to consider getting an ultrasound duplex of your legs

## 2017-10-06 NOTE — ED Notes (Signed)
In xray at this time.
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
## 2017-10-12 ENCOUNTER — Other Ambulatory Visit: Payer: Self-pay | Admitting: Student

## 2017-10-12 DIAGNOSIS — E1159 Type 2 diabetes mellitus with other circulatory complications: Secondary | ICD-10-CM

## 2017-10-16 ENCOUNTER — Other Ambulatory Visit: Payer: Self-pay | Admitting: Student

## 2017-10-16 DIAGNOSIS — E1159 Type 2 diabetes mellitus with other circulatory complications: Secondary | ICD-10-CM

## 2017-10-18 ENCOUNTER — Ambulatory Visit: Payer: BLUE CROSS/BLUE SHIELD | Admitting: Cardiology

## 2017-10-29 ENCOUNTER — Other Ambulatory Visit: Payer: Self-pay

## 2017-10-29 ENCOUNTER — Ambulatory Visit: Payer: BLUE CROSS/BLUE SHIELD | Admitting: Student

## 2017-10-29 ENCOUNTER — Encounter: Payer: Self-pay | Admitting: Student

## 2017-10-29 VITALS — BP 126/74 | HR 76 | Temp 97.6°F | Ht 76.0 in | Wt 311.6 lb

## 2017-10-29 DIAGNOSIS — E1159 Type 2 diabetes mellitus with other circulatory complications: Secondary | ICD-10-CM

## 2017-10-29 DIAGNOSIS — M1611 Unilateral primary osteoarthritis, right hip: Secondary | ICD-10-CM

## 2017-10-29 DIAGNOSIS — R21 Rash and other nonspecific skin eruption: Secondary | ICD-10-CM | POA: Diagnosis not present

## 2017-10-29 LAB — POCT GLYCOSYLATED HEMOGLOBIN (HGB A1C): Hemoglobin A1C: 6.6

## 2017-10-29 MED ORDER — CLOBETASOL PROPIONATE 0.05 % EX LOTN: TOPICAL_LOTION | CUTANEOUS | 0 refills | Status: DC

## 2017-10-29 MED ORDER — HYDROCODONE-ACETAMINOPHEN 10-325 MG PO TABS: 1.0000 | ORAL_TABLET | Freq: Four times a day (QID) | ORAL | 0 refills | Status: DC | PRN

## 2017-10-29 NOTE — Patient Instructions (Addendum)
It was great seeing you today! We have addressed the following issues today  Skin rash: This could be related to the injection you received.  Gave you a prescription for steroid lotion.  You this for 2 weeks.  You can also use Zyrtec or Claritin for itching.  Do not combine this with your Benadryl.  You may come back and see us if no improvement after 2 weeks  Right hip pain: We will give you a prescription for pain medication.  Please use this medication sparingly for severe pain.    Leg swelling: Recommend leg elevation.  You can also double up on the Lasix dose when you weight is up by 3 or more pounds over your normal weight.  If no improvement, please come back and see us.  If we did any lab work today, and the results require attention, either me or my nurse will get in touch with you. If everything is normal, you will get a letter in mail and a message via . If you don't hear from us in two weeks, please give us a call. Otherwise, we look forward to seeing you again at your next visit. If you have any questions or concerns before then, please call the clinic at [phone].  Please bring all your medications to every doctors visit  Sign up for My Chart to have easy access to your labs results, and communication with your Primary care physician.    Please check-out at the front desk before leaving the clinic.    Take Care,   Dr. Alanda SlimGonfa

## 2017-10-29 NOTE — Progress Notes (Signed)
Subjective:    Steve RuizJohn is a 61 y.o. old male here for skin rash  HPI Skin rash: this has been going on for one month. Started on his arm and chest and spread all over. It is red and very pruritic. Started a day after he was given an injection for erectile dysfunction.  Denies fever, chills, sore throat, cold-like symptoms, dyspnea, chest pain, nausea, vomiting, abdominal pain.  Denies new soap or detergent.  Denies recent outdoor activities or insect bites.  Denies any food.  He never had similar rash in the past.  Overall, he states that his rash is getting better.  He has been using over-the-counter lotion to soothe the itching.  He also reports using Benadryl.  Patient presented to ED with left ankle pain about 3 weeks ago and was given prescription for Keflex and doxycycline for presumed cellulitis of the left leg.  Right hip pain: he has known severe osteoarthritis of the right hip.  Is followed by orthopedics.  He says he will not have hip replacement until his A1c is below 7.2%.Marland Kitchen. He brought an empty bottle of Norco 10/325 mg that he has gotten from his previous PCP about 6 months ago.  He reports using this very regularly when the pain is very severe.  Denies of smoking or recreational drug use.  PMH/Problem List: has Atrial flutter (HCC); Bradycardia; CAD. PCI in 2008 and CABG in 2011; Diabetes [facility](HCC); Essential hypertension; Edema; Hyperlipidemia; Disc displacement, lumbar; Plantar fibromatosis; Palpitations; Vitreous degeneration, bilateral; Hypertensive retinopathy of both eyes; Lattice degeneration of retina, right eye; Morbid obesity (HCC); Erectile dysfunction; and Tinea corporis on their problem list.   has a past medical history of Coronary artery disease, GERD (gastroesophageal reflux disease) (<10/02/2010), High cholesterol, History of blood transfusion (1979), History of hiatal hernia (<10/02/2010), Hypertension, Myocardial infarction (HCC) (2008; 2011), and Type II diabetes mellitus  (HCC).  FH:  Family History  Problem Relation Age of Onset  . CAD Mother   . CAD Father     SH Social History   Tobacco Use  . Smoking status: Never Smoker  . Smokeless tobacco: Never Used  Substance Use Topics  . Alcohol use: Yes    Alcohol/week: 1.2 oz    Types: 1 Glasses of wine, 1 Shots of liquor per week    Comment: 07/02/2017 "I don't drink q wk"  . Drug use: No    Review of Systems Review of systems negative except for pertinent positives and negatives in history of present illness above.     Objective:     Vitals:   10/29/17 0928  BP: 126/74  Pulse: 76  Temp: 97.6 F (36.4 C)  TempSrc: Oral  SpO2: 99%  Weight: (!) 311 lb 9.6 oz (141.3 kg)  Height: 6\' 4"  (1.93 m)   Body mass index is 37.93 kg/m.  Physical Exam GEN: appears well, no ditress EYES: PERRL, EOMI THROAT: MMM, no erythema or exudate, no buccal lesion NECK: Supple, no LAD RESP:  No IWOB, CTAB CVS:  RRR, normal S1&S2, no murmurs, 1+ nonpitting edema bilaterally GI: soft, NT with active BS SKIN: Blanchable erythematous circular macules with raised borders over his back and trunk and arms.  Also blanchable erythematous patch over his right leg. No increased warmth to touch.  No tenderness to palpation. n       NEURO: Grossly intact PSYCH: normal affect    Assessment and Plan:  1. Rash and nonspecific skin eruption: Pruritic erythematous rash all over his body suspicious  for psoriasis.  I also wonder if this is related to the injection he received for his erectile dysfunction about a month ago based on temporal association.  Not consistent with tinea corporis.He has no constitutional symptoms to think of cellulitis. There is no increased warmth to touch either.  Overall, rash has improved significantly per patient.  I gave him prescription for clobetasol lotion.  Can continue using Benadryl or Zyrtec.  He understands that Benadryl might make him sleepy  2. Osteoarthritis of right hip,  unspecified osteoarthritis type: Chronic issue.  Followed by orthopedics.  Planning to have hip replacement.  Was told by orthopedic surgeon that his A1c has to be below 7.2% before they schedule.  Used to [employer] previously.  Given prescription for Norco 10/325, #30.  Will check A1c today.  If less than 7.2%, I encouraged him to call his orthopedic surgeon to schedule his hip replacement.  He also understands that this is the last Norco prescription.  3. Type 2 diabetes mellitus with other circulatory complication, without long-term current use of insulin (HCC): checking A1c early as the orthopedic surgeon wanted his A1c below 7.2% before scheduling his hip replacement surgery.   Return in about 2 months (around 12/29/2017), or if symptoms worsen or fail to improve, for DM.  Almon Herculesaye T Gonfa, MD 10/29/17 Pager: [phone]

## 2017-11-11 ENCOUNTER — Other Ambulatory Visit: Payer: BLUE CROSS/BLUE SHIELD

## 2017-11-15 ENCOUNTER — Encounter: Payer: Self-pay | Admitting: Pharmacist

## 2017-11-15 ENCOUNTER — Other Ambulatory Visit: Payer: Self-pay | Admitting: Student

## 2017-11-15 ENCOUNTER — Other Ambulatory Visit: Payer: BLUE CROSS/BLUE SHIELD | Admitting: *Deleted

## 2017-11-15 DIAGNOSIS — E1159 Type 2 diabetes mellitus with other circulatory complications: Secondary | ICD-10-CM

## 2017-11-15 DIAGNOSIS — E785 Hyperlipidemia, unspecified: Secondary | ICD-10-CM

## 2017-11-15 LAB — LIPID PANEL
Chol/HDL Ratio: 4.9 ratio (ref 0.0–5.0)
Cholesterol, Total: 180 mg/dL (ref 100–199)
HDL: 37 mg/dL — ABNORMAL LOW (ref >39)
LDL Calculated: 129 mg/dL — ABNORMAL HIGH (ref 0–99)
Triglycerides: 71 mg/dL (ref 0–149)
VLDL Cholesterol Cal: 14 mg/dL (ref 5–40)

## 2017-11-15 LAB — HEPATIC FUNCTION PANEL
ALT: 24 IU/L (ref 0–44)
AST: 20 IU/L (ref 0–40)
Albumin: 4.3 g/dL (ref 3.6–4.8)
Alkaline Phosphatase: 82 IU/L (ref 39–117)
Bilirubin Total: 0.5 mg/dL (ref 0.0–1.2)
Bilirubin, Direct: 0.14 mg/dL (ref 0.00–0.40)
Total Protein: 6.9 g/dL (ref 6.0–8.5)

## 2017-11-29 ENCOUNTER — Encounter: Payer: Self-pay | Admitting: Pharmacist

## 2017-12-04 ENCOUNTER — Encounter: Payer: Self-pay | Admitting: Student

## 2017-12-10 ENCOUNTER — Ambulatory Visit: Payer: BLUE CROSS/BLUE SHIELD | Admitting: Student

## 2017-12-10 ENCOUNTER — Encounter: Payer: Self-pay | Admitting: Student

## 2017-12-10 ENCOUNTER — Other Ambulatory Visit: Payer: Self-pay

## 2017-12-10 VITALS — BP 118/72 | HR 77 | Temp 98.1°F | Wt 315.6 lb

## 2017-12-10 DIAGNOSIS — I10 Essential (primary) hypertension: Secondary | ICD-10-CM | POA: Diagnosis not present

## 2017-12-10 DIAGNOSIS — Z79899 Other long term (current) drug therapy: Secondary | ICD-10-CM

## 2017-12-10 DIAGNOSIS — Z01818 Encounter for other preprocedural examination: Secondary | ICD-10-CM

## 2017-12-10 NOTE — Progress Notes (Signed)
  Subjective:    Steve [name] is a 62 y.o. old male here skin swab for MRSA.  HPI Patient has an upcoming right hip surgery in about a months.  His orthopedic surgeon asked for MRSA screen from his groin and nares.  Patient without history of MRSA.  No recent illness.   PMH/Problem List: has Atrial flutter (HCC); Bradycardia; CAD. PCI in 2008 and CABG in 2011; Diabetes [facility](HCC); Essential hypertension; Edema; Hyperlipidemia; Disc displacement, lumbar; Plantar fibromatosis; Palpitations; Vitreous degeneration, bilateral; Hypertensive retinopathy of both eyes; Lattice degeneration of retina, right eye; Morbid obesity (HCC); Erectile dysfunction; and Tinea corporis on their problem list.   has a past medical history of Coronary artery disease, GERD (gastroesophageal reflux disease) (<10/02/2010), High cholesterol, History of blood transfusion (1979), History of hiatal hernia (<10/02/2010), Hypertension, Myocardial infarction (HCC) (2008; 2011), and Type II diabetes mellitus (HCC).  FH:  Family History  Problem Relation Age of Onset  . CAD Mother   . CAD Father     SH Social History   Tobacco Use  . Smoking status: Never Smoker  . Smokeless tobacco: Never Used  Substance Use Topics  . Alcohol use: Yes    Alcohol/week: 1.2 oz    Types: 1 Glasses of wine, 1 Shots of liquor per week    Comment: 07/02/2017 "I don't drink q wk"  . Drug use: No    Review of Systems Review of systems negative except for pertinent positives and negatives in history of present illness above.     Objective:     Vitals:   12/10/17 1331  BP: 118/72  Pulse: 77  Temp: 98.1 F (36.7 C)  TempSrc: Oral  SpO2: 95%  Weight: (!) 315 lb 9.6 oz (143.2 kg)   Body mass index is 38.42 kg/m.  Physical Exam  GEN: appears well, no apparent distress. Nares: no rhinorrhea, congestion or erythema.  MRSA swab obtained. HEM: negative for cervical or periauricular lymphadenopathies CVS: RRR, nl s1 & s2, no murmurs, no  edema RESP: no IWOB MSK: no focal tenderness or notable swelling.  Limping due to right hip pain. SKIN: Some scattered erythematous lesion due to his psoriasis. NEURO: alert and oiented appropriately, no gross deficits  PSYCH: euthymic mood with congruent affect    Assessment and Plan:  1. Preoperative testing: patient here for MRSA screen for his upcoming right hip surgery.  No recent illness or fever.  Denies history of MRSA in the past.  MRSA swab obtained from his right groin and nares. - MRSA culture, nares - MRSA culture, right groin  2. Encounter for medication review: medications reviewed and updated in the system.  3. Essential hypertension: state that his blood pressure gets high at night.  He says he has been taking amlodipine 10 mg twice daily.  Has history of leg swelling.  I suggested taking 10 mg daily instead of twice daily.  He is also on Coreg, losartan and carvedilol.  His blood pressure is normal today.  We will readdress this when he returns for his diabetes in 4 months.  Return in about 4 months (around 04/09/2018) for Diabetes.  Almon Herculesaye T Gonfa, MD 12/10/17 Pager: [phone]

## 2017-12-10 NOTE — Patient Instructions (Signed)
It was great seeing you today! We have addressed the following issues today  Skin culture: We have taken specimen.  The results might take up to 5 days.  We will let you know when we have the results back. If we did any lab work today, and the results require attention, either me or my nurse will get in touch with you. If everything is normal, you will get a letter in mail and a message via . If you don't hear from us in two weeks, please give us a call. Otherwise, we look forward to seeing you again at your next visit. If you have any questions or concerns before then, please call the clinic at [phone].  Please bring all your medications to every doctors visit  Sign up for My Chart to have easy access to your labs results, and communication with your Primary care physician.    Please check-out at the front desk before leaving the clinic.    Take Care,   Dr. Alanda SlimGonfa

## 2017-12-13 ENCOUNTER — Encounter: Payer: Self-pay | Admitting: Student

## 2017-12-13 DIAGNOSIS — Z22322 Carrier or suspected carrier of Methicillin resistant Staphylococcus aureus: Secondary | ICD-10-CM

## 2017-12-13 LAB — MRSA CULTURE
MRSA Screen: NEGATIVE
MRSA Screen: POSITIVE — AB

## 2017-12-16 ENCOUNTER — Other Ambulatory Visit: Payer: Self-pay | Admitting: Student

## 2017-12-16 DIAGNOSIS — E1159 Type 2 diabetes mellitus with other circulatory complications: Secondary | ICD-10-CM

## 2017-12-16 MED ORDER — MUPIROCIN 2 % EX OINT: TOPICAL_OINTMENT | CUTANEOUS | 0 refills | Status: DC

## 2017-12-24 ENCOUNTER — Ambulatory Visit: Payer: BLUE CROSS/BLUE SHIELD | Admitting: Student

## 2017-12-24 ENCOUNTER — Encounter: Payer: Self-pay | Admitting: Student

## 2017-12-24 ENCOUNTER — Other Ambulatory Visit: Payer: Self-pay

## 2017-12-24 VITALS — BP 132/68 | HR 80 | Temp 97.7°F | Ht 76.0 in | Wt 313.0 lb

## 2017-12-24 DIAGNOSIS — Z01818 Encounter for other preprocedural examination: Secondary | ICD-10-CM | POA: Diagnosis not present

## 2017-12-24 DIAGNOSIS — R21 Rash and other nonspecific skin eruption: Secondary | ICD-10-CM | POA: Diagnosis not present

## 2017-12-24 NOTE — Patient Instructions (Addendum)
It was great seeing you today! We have addressed the following issues today  MRSA screen: We sent a swab for the culture.  We will let you know when the result comes back.  Skin rash: This is likely psoriasis.  Continue using your steroid lotion.  We sent a pathology today.  We will let you know when we have the results.   If we did any lab work today, and the results require attention, either me or my nurse will get in touch with you. If everything is normal, you will get a letter in mail and a message via . If you don't hear from us in two weeks, please give us a call. Otherwise, we look forward to seeing you again at your next visit. If you have any questions or concerns before then, please call the clinic at [phone].  Please bring all your medications to every doctors visit  Sign up for My Chart to have easy access to your labs results, and communication with your Primary care physician.    Please check-out at the front desk before leaving the clinic.    Take Care,   Dr. Alanda SlimGonfa

## 2017-12-24 NOTE — Progress Notes (Signed)
Subjective:    Steve [name] is a 62 y.o. old male here for MRSA swab and skin rash  HPI MRSA swab: patient is here for MRSA swab of his right groin before his right hip replacement as recommended by his orthopedic surgeon.  Previous swab was positive for MRSA.  He was treated by bacitracin for the last 5 days.  He reports using this consistently.   Skin rash: Was seen for similar rash about 3 months ago.  Rash was red and pruritic.  At that time he was treated with clobetasol lotion for presumed psoriasis.  Rash improved with the treatment.  He has been using clobetasol lotion intermittently.  Now he noticed worsening of his rash on his arms and legs again.  He denies recent illness or new medication.  PMH/Problem List: has Atrial flutter (HCC); Bradycardia; CAD. PCI in 2008 and CABG in 2011; Diabetes [facility]); Essential hypertension; Edema; Hyperlipidemia; Disc displacement, lumbar; Plantar fibromatosis; Palpitations; Vitreous degeneration, bilateral; Hypertensive retinopathy of both eyes; Lattice degeneration of retina, right eye; Morbid obesity (HCC); Erectile dysfunction; and Tinea corporis on their problem list.   has a past medical history of Coronary artery disease, GERD (gastroesophageal reflux disease) (<10/02/2010), High cholesterol, History of blood transfusion (1979), History of hiatal hernia (<10/02/2010), Hypertension, Myocardial infarction (HCC) (2008; 2011), and Type II diabetes mellitus (HCC).  FH:  Family History  Problem Relation Age of Onset  . CAD Mother   . CAD Father     SH Social History   Tobacco Use  . Smoking status: Never Smoker  . Smokeless tobacco: Never Used  Substance Use Topics  . Alcohol use: Yes    Alcohol/week: 1.2 oz    Types: 1 Glasses of wine, 1 Shots of liquor per week    Comment: 07/02/2017 "I don't drink q wk"  . Drug use: No    Review of Systems Review of systems negative except for pertinent positives and negatives in history of present illness  above.     Objective:     Vitals:   12/24/17 1326  BP: 132/68  Pulse: 80  Temp: 97.7 F (36.5 C)  TempSrc: Oral  SpO2: 97%  Weight: (!) 313 lb (142 kg)  Height: 6\' 4"  (1.93 m)   Body mass index is 38.1 kg/m.  Physical Exam  GEN: appears well, no apparent distress. HEM: negative for cervical or periauricular lymphadenopathies CVS: RRR, nl s1 & s2, no murmurs, no edema RESP: no IWOB MSK: no focal tenderness or notable swelling SKIN: Scaly erythematous skin lesion over his arms and his legs bilaterally.  Rash is blanching.  No increased warmth to touch nor swelling. ENDO: negative thyromegally NEURO: alert and oiented appropriately, no gross deficits  PSYCH: euthymic mood with congruent affect    Assessment and Plan:  1. Preoperative testing: MRSA swab obtained from right groin.  - MRSA culture  2. Rash and nonspecific skin eruption: Likely psoriasis based on morphology.  Recommend continuing clobetasol.  Punch biopsy obtained from left forearm and sent for pathology - Dermatology pathology  Punch Biopsy Procedure Note  Pre-operative Diagnosis:? Psoriasis  Post-operative Diagnosis: same  Locations:left forearm  Indications: Diagnostic  Anesthesia: Lidocaine 1% without epinephrine without added sodium bicarbonate  Procedure Details  History of allergy to iodine: no Patient informed of the risks (including bleeding and infection) and benefits of the  procedure and Written informed consent obtained.  The lesion and surrounding area was given a sterile prep using betadyne and draped in the usual sterile fashion.  The skin was then stretched perpendicular to the skin tension lines and the lesion removed using the 3 mm punch.  Hemostasis obtained after silver nitrate. A sterile dressing applied. The specimen was sent for pathologic examination. The patient tolerated the procedure well.  EBL: 0.2 ml  Condition: Stable  Complications: none.  Plan: 1. Instructed  to keep the wound dry and covered for 24-48h and clean thereafter. 2. Warning signs of infection were reviewed.    Return if symptoms worsen or fail to improve.  Almon Herculesaye T Gonfa, MD 12/24/17 Pager: [phone]

## 2017-12-26 LAB — MRSA CULTURE: MRSA Screen: NEGATIVE

## 2018-01-06 ENCOUNTER — Other Ambulatory Visit: Payer: Self-pay | Admitting: Student

## 2018-01-06 DIAGNOSIS — R21 Rash and other nonspecific skin eruption: Secondary | ICD-10-CM

## 2018-01-06 DIAGNOSIS — Z22322 Carrier or suspected carrier of Methicillin resistant Staphylococcus aureus: Secondary | ICD-10-CM

## 2018-01-07 ENCOUNTER — Encounter: Payer: Self-pay | Admitting: Student

## 2018-01-14 ENCOUNTER — Other Ambulatory Visit: Payer: Self-pay | Admitting: Student

## 2018-01-14 ENCOUNTER — Encounter: Payer: Self-pay | Admitting: Student

## 2018-01-14 DIAGNOSIS — E1159 Type 2 diabetes mellitus with other circulatory complications: Secondary | ICD-10-CM

## 2018-01-16 ENCOUNTER — Other Ambulatory Visit: Payer: Self-pay | Admitting: Student

## 2018-01-16 DIAGNOSIS — Z22322 Carrier or suspected carrier of Methicillin resistant Staphylococcus aureus: Secondary | ICD-10-CM

## 2018-01-16 MED ORDER — MUPIROCIN 2 % EX OINT: TOPICAL_OINTMENT | CUTANEOUS | 0 refills | Status: DC

## 2018-01-21 ENCOUNTER — Other Ambulatory Visit: Payer: Self-pay | Admitting: Student

## 2018-01-21 ENCOUNTER — Ambulatory Visit: Payer: BLUE CROSS/BLUE SHIELD | Admitting: Student

## 2018-01-21 DIAGNOSIS — Z01818 Encounter for other preprocedural examination: Secondary | ICD-10-CM

## 2018-01-21 NOTE — Addendum Note (Signed)
Addended by: Jennette BillBUSICK, ROBERT L on: 01/21/2018 03:58 PM   Modules accepted: Orders

## 2018-01-24 LAB — MRSA CULTURE: MRSA Screen: NEGATIVE

## 2018-01-31 ENCOUNTER — Telehealth: Payer: Self-pay | Admitting: Student

## 2018-01-31 NOTE — Telephone Encounter (Signed)
[name] would like us to fax the patients labs from 12/24/2017 and 01/21/2018 plus any notes from the last visit. They are trying to get his surgery scheduled. Please fax this to [phone]. jw

## 2018-02-05 NOTE — Telephone Encounter (Signed)
[facility] and office was closed.  Will try again tomorrow.  Need to see if a Release of Information was completed by the patient and sent to us so that we can send the requested information.  I did not see one in the pt chart.  We will need this sent before we send the requested information. Lamonte SakaiZimmerman Rumple, [name], [place]

## 2018-02-06 NOTE — Telephone Encounter (Signed)
[facility] on the VM for surgery set up. If they call back we need to have a signed Release of Informtation giving us permission to send this to them. I did not see one in the patients chart.  Once we receive this we can gather this information and send it to them.  Please inform them when they call back.  Lamonte SakaiZimmerman Rumple, [name], [place]

## 2018-02-17 ENCOUNTER — Ambulatory Visit: Payer: BLUE CROSS/BLUE SHIELD | Admitting: Student

## 2018-02-17 ENCOUNTER — Other Ambulatory Visit: Payer: Self-pay

## 2018-02-17 ENCOUNTER — Encounter: Payer: Self-pay | Admitting: Student

## 2018-02-17 VITALS — BP 135/80 | HR 73 | Temp 98.8°F | Ht 76.0 in | Wt 310.0 lb

## 2018-02-17 DIAGNOSIS — Z22322 Carrier or suspected carrier of Methicillin resistant Staphylococcus aureus: Secondary | ICD-10-CM | POA: Diagnosis not present

## 2018-02-17 DIAGNOSIS — Z01818 Encounter for other preprocedural examination: Secondary | ICD-10-CM | POA: Diagnosis not present

## 2018-02-17 DIAGNOSIS — E1159 Type 2 diabetes mellitus with other circulatory complications: Secondary | ICD-10-CM | POA: Diagnosis not present

## 2018-02-17 LAB — POCT GLYCOSYLATED HEMOGLOBIN (HGB A1C): Hemoglobin A1C: 5.4

## 2018-02-17 MED ORDER — MUPIROCIN 2 % EX OINT: TOPICAL_OINTMENT | CUTANEOUS | 0 refills | Status: DC

## 2018-02-17 NOTE — Patient Instructions (Addendum)
It was great seeing you today! We have addressed the following issues today  Presurgical assessment: I do not see anything that should hold you back from having your hip surgery.  You can also discuss about this with Dr. Melburn PopperNasher when you see him.  You can hold your Eliquis for 48 hours before the surgery and resume 24 hours after the surgery.  Diabetes: You A1c is 5.4% today. You can continue taking your medication. You can also take a break from one of your medication, particularly glipizide as this will increase the risk of hypoglycemia.  If we did any lab work today, and the results require attention, either me or my nurse will get in touch with you. If everything is normal, you will get a letter in mail and a message via . If you don't hear from us in two weeks, please give us a call. Otherwise, we look forward to seeing you again at your next visit. If you have any questions or concerns before then, please call the clinic at [phone].  Please bring all your medications to every doctors visit  Sign up for My Chart to have easy access to your labs results, and communication with your Primary care physician.    Please check-out at the front desk before leaving the clinic.    Take Care,   Dr. Alanda SlimGonfa

## 2018-02-17 NOTE — Progress Notes (Signed)
Subjective:    Steve [name] is a 62 y.o. old male here for pre-operative medical clearance  HPI Preoperative medical clearance: patient has an upcoming right hip replacement in 10 days.  He presented to clinic for clearance for his surgery.  He has history of MI status post CABG in 2011.  He denies chest pain or exertional dyspnea.  He has no history of CHF.  His recent echo about 8 months ago with EF of of 60-65% but indeterminate diastolic function due to atrial fibrillation.  There was no motion abnormality.  He has history of atrial fibrillation.  He is rate-controlled on carvedilol.  Eliquis for anticoagulation.  Diabetes and hypertension well controlled.  He has no history of lung disease. PMH/Problem List: has Atrial flutter (HCC); Bradycardia; CAD. PCI in 2008 and CABG in 2011; Diabetes [facility]); Essential hypertension; Edema; Hyperlipidemia; Disc displacement, lumbar; Plantar fibromatosis; Palpitations; Vitreous degeneration, bilateral; Hypertensive retinopathy of both eyes; Lattice degeneration of retina, right eye; Morbid obesity (HCC); Erectile dysfunction; and Tinea corporis on their problem list.   has a past medical history of Coronary artery disease, GERD (gastroesophageal reflux disease) (<10/02/2010), High cholesterol, History of blood transfusion (1979), History of hiatal hernia (<10/02/2010), Hypertension, Myocardial infarction (HCC) (2008; 2011), and Type II diabetes mellitus (HCC).  FH:  Family History  Problem Relation Age of Onset  . CAD Mother   . CAD Father     SH Social History   Tobacco Use  . Smoking status: Never Smoker  . Smokeless tobacco: Never Used  Substance Use Topics  . Alcohol use: Yes    Alcohol/week: 1.2 oz    Types: 1 Glasses of wine, 1 Shots of liquor per week    Comment: 07/02/2017 "I don't drink q wk"  . Drug use: No    Current Outpatient Medications:  .  amLODipine (NORVASC) 10 MG tablet, Take 10 mg by mouth daily., Disp: , Rfl:  .  apixaban  (ELIQUIS) 5 MG TABS tablet, Take 1 tablet (5 mg total) by mouth 2 (two) times daily., Disp: 180 tablet, Rfl: 3 .  CALCIUM PO, Take 1 tablet by mouth daily., Disp: , Rfl:  .  carvedilol (COREG) 6.25 MG tablet, TAKE 1 TABLET BY MOUTH TWICE A DAY WITH A MEAL, Disp: 180 tablet, Rfl: 3 .  Clobetasol Propionate 0.05 % lotion, APPLY A THIN LAYER TOPICALLY TO AFFECTED AREA TWICE DAILY, Disp: 118 mL, Rfl: 0 .  Digestive Enzymes (DIGESTIVE ENZYME PO), Take 1 capsule by mouth daily., Disp: , Rfl:  .  furosemide (LASIX) 20 MG tablet, Take 2 tablets (40 mg total) by mouth daily., Disp: 30 tablet, Rfl: 5 .  glipiZIDE (GLUCOTROL) 10 MG tablet, TAKE 1 TABLET BY MOUTH TWICE A DAY BEFORE A MEAL, Disp: 180 tablet, Rfl: 3 .  glucose blood test strip, Use to check blood glucose 1-2 times a day, Disp: 100 each, Rfl: 12 .  HYDROcodone-acetaminophen (NORCO) 10-325 MG tablet, Take 1 tablet by mouth every 6 (six) hours as needed., Disp: 30 tablet, Rfl: 0 .  JANUMET 50-500 MG tablet, Take 1 tablet by mouth 2 (two) times daily with a meal., Disp: , Rfl: 5 .  JARDIANCE 10 MG TABS tablet, TAKE 10 MG BY MOUTH DAILY., Disp: 90 tablet, Rfl: 0 .  losartan (COZAAR) 50 MG tablet, Take 1 tablet (50 mg total) by mouth daily., Disp: 90 tablet, Rfl: 3 .  MAGNESIUM-ZINC PO, Take 1 capsule by mouth daily as needed (supplement)., Disp: , Rfl:  .  mupirocin ointment (  BACTROBAN) 2 %, Apply 2% ointment topically to your groin area 3 times per day for 10 days, Disp: 22 g, Rfl: 0 .  potassium chloride (MICRO-K) 10 MEQ CR capsule, Take 1 capsule (10 mEq total) by mouth daily. with food, Disp: 90 capsule, Rfl: 3 .  sildenafil (VIAGRA) 100 MG tablet, Take 100 mg by mouth daily as needed for erectile dysfunction., Disp: , Rfl: 0 .  tiZANidine (ZANAFLEX) 4 MG capsule, Take 4 mg by mouth 3 (three) times daily., Disp: , Rfl: 2 Review of Systems Review of systems negative except for pertinent positives and negatives in history of present illness above.       Objective:     Vitals:   02/17/18 1637 02/17/18 1700  BP: (!) 142/78 135/80  Pulse: 73   Temp: 98.8 F (37.1 C)   TempSrc: Oral   SpO2: 98%   Weight: (!) 310 lb (140.6 kg)   Height: 6\' 4"  (1.93 m)    Body mass index is 37.73 kg/m.  Physical Exam  GEN: appears well, no apparent distress. HEM: negative for cervical or periauricular lymphadenopathies CVS: RRR, nl s1 & s2, no murmurs, no edema, RESP: no IWOB, good air movement bilaterally, CTAB GI: BS present & normal, soft, NTND MSK: no focal tenderness or notable swelling ENDO: negative thyromegally NEURO: alert and oiented appropriately, no gross deficits  PSYCH: euthymic mood with congruent affect    Assessment and Plan:  1. Preoperative medical clearance: patient going for orthopedic surgery (right hip replacement) in 10 days which is considered an intermediate risk surgery that carries 1-5% risk of cardiac death or nonfatal MI. He has history of MI s/p CABG but no subsequent anginal symptoms. His RCRI score is very low (<1%) suggesting very low risk of MI, pulmonary edema, ventricular fibrillation, cardiac arrest or complete heart blok. Overall, he is considered a very low risk candidate from cardiac stand point. I discussed the above risks with the patient. I also encouraged him to discuss this with his cardiologist when he sees them in three days.   He is on Eliquis for his atrial fibrillation/flutter. His CHAD-Vasc score is 3 (HTN, CAD and DM-2) suggesting a 4.3% annual risk of stroke or embolism. He voiced understanding the risk. His Eliquis can be held about 48 hours before surgery and resumed 24 hours post-op. No bridging needed.  2. Type 2 diabetes mellitus: A1c 5.4% today. Discussed the option of taking a break from his glipizide given risk of hypoglycemia. He says he would rather keep all.   Return in about 3 months (around 05/20/2018) for DM.  Almon Herculesaye T Juri Dinning, MD 02/17/18 Pager: [phone]

## 2018-02-17 NOTE — Telephone Encounter (Signed)
Pt has an appointment today to get the clearance to have the surgery. Steve [name], [name], [place]

## 2018-02-18 ENCOUNTER — Telehealth: Payer: Self-pay | Admitting: *Deleted

## 2018-02-18 ENCOUNTER — Ambulatory Visit: Payer: BC Managed Care – PPO | Attending: Orthopaedic Surgery

## 2018-02-18 DIAGNOSIS — Z01810 Encounter for preprocedural cardiovascular examination: Secondary | ICD-10-CM

## 2018-02-18 DIAGNOSIS — Z01818 Encounter for other preprocedural examination: Secondary | ICD-10-CM | POA: Insufficient documentation

## 2018-02-18 LAB — VH URINALYSIS WITH MICROSCOPIC AND CULTURE IF INDICATED
Bilirubin, UA: NEGATIVE
Blood, UA: NEGATIVE
Glucose, UA: >=500 mg/dL — AB
Ketones UA: NEGATIVE mg/dL
Leukocyte Esterase, UA: NEGATIVE Leu/uL
Nitrite, UA: NEGATIVE
Protein, UR: 30 mg/dL — AB
RBC, UA: 1 /hpf (ref 0–4)
Urine Specific Gravity: 1.025 (ref 1.001–1.040)
Urobilinogen, UA: NORMAL mg/dL
WBC, UA: 1 /hpf (ref 0–4)
pH, Urine: 5 pH (ref 5.0–8.0)

## 2018-02-18 LAB — COMPREHENSIVE METABOLIC PANEL
ALT: 34 U/L (ref 0–55)
AST (SGOT): 22 U/L (ref 10–42)
Albumin/Globulin Ratio: 1.5 Ratio (ref 0.70–1.50)
Albumin: 4.2 gm/dL (ref 3.5–5.0)
Alkaline Phosphatase: 82 U/L (ref 40–145)
Anion Gap: 12.2 mMol/L (ref 7.0–18.0)
BUN / Creatinine Ratio: 20.7 Ratio (ref 10.0–30.0)
BUN: 28 mg/dL — ABNORMAL HIGH (ref 7–22)
Bilirubin, Total: 0.7 mg/dL (ref 0.1–1.2)
CO2: 23.3 mMol/L (ref 20.0–30.0)
Calcium: 9.3 mg/dL (ref 8.5–10.5)
Chloride: 107 mMol/L (ref 98–110)
Creatinine: 1.35 mg/dL — ABNORMAL HIGH (ref 0.80–1.30)
EGFR: 57 mL/min/{1.73_m2} — ABNORMAL LOW (ref 60–150)
Globulin: 2.8 gm/dL (ref 2.0–4.0)
Glucose: 77 mg/dL (ref 71–99)
Osmolality Calc: 280 mOsm/kg (ref 275–300)
Potassium: 4.5 mMol/L (ref 3.5–5.3)
Protein, Total: 7 gm/dL (ref 6.0–8.3)
Sodium: 138 mMol/L (ref 136–147)

## 2018-02-18 LAB — CBC AND DIFFERENTIAL
Basophils %: 1.1 % (ref 0.0–3.0)
Basophils Absolute: 0.1 10*3/uL (ref 0.0–0.3)
Eosinophils %: 2.4 % (ref 0.0–7.0)
Eosinophils Absolute: 0.1 10*3/uL (ref 0.0–0.8)
Hematocrit: 40.5 % (ref 39.0–52.5)
Hemoglobin: 13.5 gm/dL (ref 13.0–17.5)
Lymphocytes Absolute: 1.4 10*3/uL (ref 0.6–5.1)
Lymphocytes: 26.7 % (ref 15.0–46.0)
MCH: 35 pg (ref 28–35)
MCHC: 33 gm/dL (ref 32–36)
MCV: 104 fL — ABNORMAL HIGH (ref 80–100)
MPV: 8.4 fL (ref 6.0–10.0)
Monocytes Absolute: 0.5 10*3/uL (ref 0.1–1.7)
Monocytes: 10.3 % (ref 3.0–15.0)
Neutrophils %: 59.4 % (ref 42.0–78.0)
Neutrophils Absolute: 3.1 10*3/uL (ref 1.7–8.6)
PLT CT: 242 10*3/uL (ref 130–440)
RBC: 3.89 10*6/uL — ABNORMAL LOW (ref 4.00–5.70)
RDW: 13.5 % (ref 11.0–14.0)
WBC: 5.2 10*3/uL (ref 4.0–11.0)

## 2018-02-18 LAB — ECG 12-LEAD
Patient Age: 61 years
Q-T Interval(Corrected): 446 ms
Q-T Interval: 446 ms
QRS Axis: -24 deg
QRS Duration: 104 ms
T Axis: 47 years
Ventricular Rate: 60 //min

## 2018-02-18 LAB — PT AND APTT
PT INR: 1.1 (ref 0.5–1.3)
PT: 11 s (ref 9.5–11.5)
aPTT: 33.3 s (ref 24.0–34.0)

## 2018-02-18 LAB — TYPE AND SCREEN
AB Screen: NEGATIVE
ABO Rh: A POS

## 2018-02-18 NOTE — Telephone Encounter (Signed)
Faxed information to Dr. Ivar BuryAbby Gore.  ROI completed. Lamonte SakaiZimmerman Rumple, [name], [place]

## 2018-02-18 NOTE — Pre-Procedure Instructions (Signed)
ECHO DONE 07/2017, RESULTS IN EPIC UNDER CARE EVERYWHERE- [facility] -OTHER DOCUMENTS.

## 2018-02-18 NOTE — H&P (Signed)
ADMISSION HISTORY AND PHYSICAL EXAM    Date Time: 02/18/18 8:30 AM  Patient Name: [name]  DOB: August 13, 1956  Diagnosis: Osteoarthritis of right hip  Primary Care Provider:Dr Vickki Muff (provider in [facility])  Presurgical medical clearance obtained 02/17/2018    Cardiology provider: Dr Melburn Popper  Cardiac clearance scheduled for 02/19/2018  History of Present Illness:     [name] is a 62 y.o. male who [facility] the [facility] for a preop history and physical prior to a right total hip replacement with anterior approach to be performed by Dr. Emeline Darling on 02/23/2018. Patient has been experiencing right hip pain for 7 years, he complains of worsening symptoms over the course of the past year.  Patient has failed conservative treatment including otc pain relief, activity modification and time.  These have only provided minimal relief to the patient as the pain has progressed to the point of limited ADLs including any activity that involve going from a sitting to standing position, lying down, prolonged periods of standing and activity that involve weight baring to the right hip. Please refer to Dr. Ellyn Hack note for additional information on the history of present illness for this patient.    Past Medical History:     Past Medical History:   Diagnosis Date   . CAD (coronary artery disease)    . DM2 (diabetes mellitus, type 2)    . ED (erectile dysfunction)    . Hypertension    . MI (myocardial infarction) '08, '11   . OA (osteoarthritis)    . Psoriasis    /    Past Surgical History:     Past Surgical History:   Procedure Laterality Date   . KNEE ARTHROTOMY Left        Family History:   History reviewed. No pertinent family history.    Social History:     Social History     Social History   . Marital status: Divorced     Spouse name: N/A   . Number of children: N/A   . Years of education: N/A     Social History Main Topics   . Smoking status: Never Smoker   . Smokeless tobacco: Never Used   . Alcohol use Yes       Comment: OCCAS   . Drug use: No   . Sexual activity: Not on file     Other Topics Concern   . Not on file     Social History Narrative   . No narrative on file       Allergies:   No Known Allergies    Medications:     Current/Home Medications    AMLODIPINE (NORVASC) 10 MG TABLET    Take 10 mg by mouth daily.    APIXABAN (ELIQUIS) 5 MG    Take 5 mg by mouth every 12 (twelve) hours.    CALCIUM CARBONATE (CALCIUM 600 PO)    Take 1 tablet by mouth daily.    CARVEDILOL (COREG) 6.25 MG TABLET    Take 6.25 mg by mouth 2 (two) times daily with meals.    CLOBETASOL (TEMOVATE) 0.05 % CREAM    Apply 1 application topically 2 (two) times daily as needed.    DIGESTIVE ENZYMES (DIGESTIVE ENZYME PO)    Take 1 tablet by mouth daily.    EMPAGLIFLOZIN (JARDIANCE) 10 MG TABLET    Take 10 mg by mouth every morning.    FUROSEMIDE (LASIX) 40 MG TABLET    Take 40  mg by mouth as needed.    GLIPIZIDE (GLUCOTROL) 10 MG TABLET    Take 10 mg by mouth 2 (two) times daily before meals.    LOSARTAN (COZAAR) 50 MG TABLET    Take 50 mg by mouth daily.    MAGNESIUM-ZINC PO    Take 1 tablet by mouth daily.    POTASSIUM CHLORIDE (K-TAB, KLOR-CON) 10 MEQ TABLET    Take 10 mEq by mouth daily.    SILDENAFIL (VIAGRA) 100 MG TABLET    Take 100 mg by mouth daily as needed for Erectile Dysfunction.    SITAGLIPTIN-METFORMIN (JANUMET) 50-500 MG TABLET    Take 1 tablet by mouth 2 (two) times daily with meals.    TIZANIDINE (ZANAFLEX) 4 MG CAPSULE    Take 4 mg by mouth 3 (three) times daily as needed.       Review of Systems:   A comprehensive review of system given by patient:    Infectious disease: MRSA, MSSA, VRE:  Presurgical swabs obtained in [place]. Patient states he did have a + growth for staph to his groin, this was treated with keflex and bactrim. Repeat swabs obtained Patient denies previous history of infectious disease.  Anesthesia review:   Negative for postoperative nausea or vomiting.  Blood relative with anes problem: Patient denies.  Patient able  to achieve greater then 4 MET'S with exertional activity.   Sleep apnea: Patient lives alone. Denies waking up gasping for air. Occasionally wakes with dry mouth.       General: Negative for recent illness, fever, chills, unintentional weight change  Psychological: Negative for  anxiety, depression or memory difficulties  HEENT: Negative for headache, visual changes, vertigo, hearing loss, tinnitus, nasal congestion, oral lesions, hoarseness, sore throat.   Hematological and Lymphatic: History of blood transfusion, no adverse effects.  Negative for bleeding disorders, blood clots in legs or lungs.  Endocrine: Type 2 diabetes mellitus. Most recent A1c 5.4   Negative for thyroid problems,  polydipsia/polyuria  Respiratory: Negative for shortness of breath, COPD, asthma, wheezing or cough.  Cardiovascular: A Flutter, pt on eliquis. Myocardial infarction 2008/2011. Stent 2008. CABG 2011. Cardioversion 2018.  ECHO 07/2017: The patient appears to be in atrial fibrillation. Normal LV size with moderate LV hypertrophy. EF 60-65%. Mildly dilated RV with normal systolic function. Biatrial enlargement. Mild pulmonary hypertension. Dilated IVC suggestive of elevated RV filling pressure.  Gastrointestinal:Hiatial hernia.  Negative for dysphagia,  nausea, vomiting, constipation, diarrhea, liver problems  Genitourinary: Negative for dysuria, nocturia, hematuria, reoccurrant uti, kidney stones, BPH  AUASS assessment:  Prescreening questions do not indicate patient is at risk for postoperative urinary retention.   Musculoskeletal: Right hip pain.   Neurological: Peripheral neuropathy. Negative for seizures, radicular pain, tremors or weakness  Dermatological: Recent antibiotic treatment for cellulitis to left ankle  Post operative support system: Patient is from [place], he will be staying in [place] with friends post op.   Physical Exam:     Vitals:    02/18/18 0824   BP: 104/53   SpO2: 100%     Wt Readings from Last 1 Encounters:    No data found for Wt      Ht Readings from Last 1 Encounters:   No data found for Ht      Body mass index is 37.98 kg/m.    General appearance - Well appearing, well nourished. No acute distress.  Mental status - Alert, oriented to person, place, and time.    HEENT - Atraumatic, normocephalic,  pupils equal, round and reactive to light and accommodation, extraocular eye movements intact  Mouth - mucous membranes moist, pharynx normal without lesions  Neck - supple, no significant adenopathy, no thyromegaly, trachea midline  Lymphatics - no palpable cervical or supraclavicular lymphadenopathy  Chest - Clear to auscultation, no wheezes, rales or rhonchi, symmetric air entry  Heart - Irregular rhythm.  normal S1, S2, no murmurs, rubs, clicks or gallops  Abdomen - Soft, nontender.  Peripheral Vascular- Negative for carotid bruit bilaterally, negative for jvd bilaterally, negative for peripheral edema bilaterally, radial and posterior tibial pulses intact at 2+ bilaterally  Neurological - normal speech,  cranial nerves grossly intact, sensation intact over upper and lower extremities, strength upper extremities biceps/triceps/grip: 5/5, lower extremities quads, hamstrings: 5/5  Musculoskeletal -   no redness, swelling in any joint.   Skin - area of erythema to anterior lower left leg.     Surgical site examined and is free of any breaks in skin and any signs of infection.     See Dr.Gore's note for details of the musculoskeletal examination    Assessment:   Osteoarthritis of right hip    CAD, type 2 diabetes,  hypertension, myocardial infarction, psoriasis      Plan:    Pre admission lab work obtained:  results pending.     Right total hip replacement with anterior approach by Dr. Emeline Darling on 02/26/2018    Signed by: Anda Kraft, NP

## 2018-02-18 NOTE — Pre-Procedure Instructions (Signed)
Patient denies chest pain or shortness of breath with activity.  Is able to walk up a flight of steps of walk a city block without complaints of chest pain or shortness of breath.

## 2018-02-19 ENCOUNTER — Ambulatory Visit: Payer: BLUE CROSS/BLUE SHIELD | Admitting: Physician Assistant

## 2018-02-26 ENCOUNTER — Encounter: Admission: RE | Payer: Self-pay | Source: Ambulatory Visit

## 2018-02-26 ENCOUNTER — Inpatient Hospital Stay
Admission: RE | Admit: 2018-02-26 | Payer: BC Managed Care – PPO | Source: Ambulatory Visit | Admitting: Orthopaedic Surgery

## 2018-02-26 SURGERY — ARTHROPLASTY, HIP, TOTAL, ANTERIOR APPROACH, C ARM
Anesthesia: General | Site: Hip | Laterality: Right

## 2018-04-04 ENCOUNTER — Encounter: Payer: Self-pay | Admitting: Student

## 2018-04-08 ENCOUNTER — Encounter: Payer: Self-pay | Admitting: Cardiovascular Disease

## 2018-04-08 ENCOUNTER — Encounter: Payer: Self-pay | Admitting: Student

## 2018-04-11 ENCOUNTER — Encounter: Payer: Self-pay | Admitting: Student

## 2018-04-11 ENCOUNTER — Ambulatory Visit: Payer: BLUE CROSS/BLUE SHIELD | Admitting: Student

## 2018-04-11 VITALS — BP 118/80 | HR 61 | Temp 98.1°F | Ht 76.0 in | Wt 310.0 lb

## 2018-04-11 DIAGNOSIS — Z01818 Encounter for other preprocedural examination: Secondary | ICD-10-CM | POA: Diagnosis not present

## 2018-04-11 DIAGNOSIS — M7989 Other specified soft tissue disorders: Secondary | ICD-10-CM

## 2018-04-11 DIAGNOSIS — I10 Essential (primary) hypertension: Secondary | ICD-10-CM | POA: Diagnosis not present

## 2018-04-11 LAB — POCT GLYCOSYLATED HEMOGLOBIN (HGB A1C): Hemoglobin A1C: 5.2

## 2018-04-11 MED ORDER — AMLODIPINE BESYLATE 5 MG PO TABS: 5.0000 mg | ORAL_TABLET | Freq: Every day | ORAL | 3 refills | Status: DC

## 2018-04-11 MED ORDER — FUROSEMIDE 20 MG PO TABS: 40.0000 mg | ORAL_TABLET | Freq: Every day | ORAL | 5 refills | Status: DC | PRN

## 2018-04-11 NOTE — Progress Notes (Signed)
Subjective:    Steve [name] is a 62 y.o. old male here MRSA swab and A1c for his upcoming surgery.  HPI Preoperative testing: patient had right hip replacements surgery that was postponed to next month.  He he says he was asked by his orthopedic surgeon to have updated A1c and MRSA swab from his groin and his neighbors as preoperative clearance.  He had positive swab from his groin in the past.  He was treated with mupirocin and clear.  His repeat swab was negative about 3 months ago.  He reports using the remaining of his mupirocin ointment for about 5 days.  Last use was two days ago.  Hypertension: patient reports feeling lightheaded intermittently in the morning hours.  He says his systolic blood pressure drops to low 100.  Denies syncopal episode, dyspnea or chest pain.  He is on amlodipine 10 mg, losartan 50 mg daily, and Coreg 6.25 mg twice daily.  Reports taking his amlodipine and losartan in the evening.  Reports some swelling in his leg at the end of the day.  Uses Lasix as needed for leg swelling.  PMH/Problem List: has Atrial flutter (HCC); Bradycardia; CAD. PCI in 2008 and CABG in 2011; Diabetes [facility]); Essential hypertension; Edema; Hyperlipidemia; Disc displacement, lumbar; Plantar fibromatosis; Palpitations; Vitreous degeneration, bilateral; Hypertensive retinopathy of both eyes; Lattice degeneration of retina, right eye; Morbid obesity (HCC); Erectile dysfunction; Tinea corporis; Leg swelling; and Preoperative clearance on their problem list.   has a past medical history of Coronary artery disease, GERD (gastroesophageal reflux disease) (<10/02/2010), High cholesterol, History of blood transfusion (1979), History of hiatal hernia (<10/02/2010), Hypertension, Myocardial infarction (HCC) (2008; 2011), and Type II diabetes mellitus (HCC).  FH:  Family History  Problem Relation Age of Onset  . CAD Mother   . CAD Father     SH Social History   Tobacco Use  . Smoking status: Never Smoker   . Smokeless tobacco: Never Used  Substance Use Topics  . Alcohol use: Yes    Alcohol/week: 1.2 oz    Types: 1 Glasses of wine, 1 Shots of liquor per week    Comment: 07/02/2017 "I don't drink q wk"  . Drug use: No    Review of Systems Review of systems negative except for pertinent positives and negatives in history of present illness above.     Objective:     Vitals:   04/11/18 1027  BP: 118/80  Pulse: 61  Temp: 98.1 F (36.7 C)  TempSrc: Oral  SpO2: 96%  Weight: (!) 310 lb (140.6 kg)  Height:  (1.93 m)   Body mass index is 37.73 kg/m.  Physical Exam  GEN: appears well & comfortable. No apparent distress. Head: normocephalic and atraumatic  Eyes: conjunctiva without injection. Sclera anicteric. Nares: no rhinorrhea.  No notable swollen turbinates, erythema.  Swab obtained from both nares. CVS: RRR, nl s1 & s2 RESP: no IWOB GU: Normal SKIN: no apparent skin lesion NEURO: alert and oiented appropriately, no gross deficits   PSYCH: euthymic mood with congruent affect    Assessment and Plan:  1. Preoperative clearance: patient has an upcoming right hip replacement surgery in a month.  Already have his cardiac clearance. - Recommended stopping Jardiance a day prior to his surgery and resuming when his surgical wound heals completely. - Stop his Eliquis 48 hours before surgery and resume 24 after surgery if no bleeding. - MRSA culture from right and left groin - MRSA culture from nares - HgB A1c  2. Essential hypertension: blood pressure 118/80 today.  Given intermittent lightheadedness, low range BP and leg swelling, will reduce his amlodipine from 10 to 5 mg daily.  If no improvement with this, I suggested stopping amlodipine altogether. - amLODipine (NORVASC) 5 MG tablet; Take 1 tablet (5 mg total) by mouth daily.  Dispense: 90 tablet; Refill: 3  3. Leg swelling: No edema on exam today. -Reduced amlodipine to 5 mg daily. - furosemide (LASIX) 20 MG tablet;  Take 2 tablets (40 mg total) by mouth daily as needed.  Dispense: 30 tablet; Refill: 5  Return in about 3 months (around 07/12/2018) for Diabetes and meet new PCP.  Almon Hercules, MD 04/11/18 Pager: [phone]

## 2018-04-11 NOTE — Patient Instructions (Addendum)
It was great seeing you today! We have obtained samples for MRSA screening.  We have also checked your A1c.  We will have the results in my chart in 3 to 4 days.   Please stop your Jardiance a day before surgery, and do not resume until you surgical wound has healed completely.  Stop your Eliquis 48 hours prior to surgery.  If we did any lab work today, and the results require attention, either me or my nurse will get in touch with you. If everything is normal, you will get a letter in mail and a message via . If you don't hear from [place] in two weeks, please give [place] a call. Otherwise, we look forward to seeing you again at your next visit. If you have any questions or concerns before then, please call the clinic at [phone].  Please bring all your medications to every doctors visit  Sign up for My Chart to have easy access to your labs results, and communication with your Primary care physician.    Please check-out at the front desk before leaving the clinic.    Good luck with the surgery!  Dr. Alanda Slim

## 2018-04-13 LAB — MRSA CULTURE
MRSA Screen: NEGATIVE
MRSA Screen: NEGATIVE

## 2018-04-14 ENCOUNTER — Telehealth: Payer: Self-pay | Admitting: Student

## 2018-04-14 NOTE — Telephone Encounter (Signed)
Contacted [facility] and spoke with Geoffery Spruce, she stated that she thinks that everything had been received and that the schedule had been made for the surgery.  I told her that if there was anything else that they needed to let us know. Lamonte Sakai, [name], [place]

## 2018-04-14 NOTE — Telephone Encounter (Signed)
Can we print and fax the results of both MRSA results and A1c.  Thank you, Alwyn Ren

## 2018-04-14 NOTE — Telephone Encounter (Signed)
[name] called and said they received a fax with the patients results from his mrsa screening via mychart. She said they need the actual copy of his whole lab screening for Staff and Mrsa. She would like to have that faxed back to [facility].

## 2018-04-15 ENCOUNTER — Other Ambulatory Visit: Payer: Self-pay | Admitting: Student

## 2018-04-15 DIAGNOSIS — E1159 Type 2 diabetes mellitus with other circulatory complications: Secondary | ICD-10-CM

## 2018-04-21 ENCOUNTER — Encounter: Payer: Self-pay | Admitting: Physician Assistant

## 2018-04-21 ENCOUNTER — Encounter: Payer: Self-pay | Admitting: Student

## 2018-05-06 ENCOUNTER — Ambulatory Visit: Payer: BC Managed Care – PPO | Attending: Orthopaedic Surgery

## 2018-05-06 DIAGNOSIS — Z01818 Encounter for other preprocedural examination: Secondary | ICD-10-CM | POA: Insufficient documentation

## 2018-05-06 DIAGNOSIS — Z6837 Body mass index (BMI) 37.0-37.9, adult: Secondary | ICD-10-CM | POA: Insufficient documentation

## 2018-05-06 LAB — VH URINALYSIS WITH MICROSCOPIC AND CULTURE IF INDICATED
Bilirubin, UA: NEGATIVE
Blood, UA: NEGATIVE
Glucose, UA: >=500 mg/dL — AB
Ketones UA: 20 mg/dL — AB
Leukocyte Esterase, UA: NEGATIVE Leu/uL
Nitrite, UA: NEGATIVE
Protein, UR: NEGATIVE mg/dL
RBC, UA: 1 /hpf (ref 0–4)
Squam Epithel, UA: <1 /hpf (ref 0–2)
Urine Specific Gravity: 1.018 (ref 1.001–1.040)
Urobilinogen, UA: NORMAL mg/dL
WBC, UA: <1 /hpf (ref 0–4)
pH, Urine: 6 pH (ref 5.0–8.0)

## 2018-05-06 LAB — CBC AND DIFFERENTIAL
Basophils %: 0.8 % (ref 0.0–3.0)
Basophils Absolute: 0.1 10*3/uL (ref 0.0–0.3)
Eosinophils %: 3 % (ref 0.0–7.0)
Eosinophils Absolute: 0.3 10*3/uL (ref 0.0–0.8)
Hematocrit: 43 % (ref 39.0–52.5)
Hemoglobin: 14.8 gm/dL (ref 13.0–17.5)
Lymphocytes Absolute: 1.5 10*3/uL (ref 0.6–5.1)
Lymphocytes: 17.7 % (ref 15.0–46.0)
MCH: 36 pg — ABNORMAL HIGH (ref 28–35)
MCHC: 34 gm/dL (ref 32–36)
MCV: 105 fL — ABNORMAL HIGH (ref 80–100)
MPV: 8.8 fL (ref 6.0–10.0)
Monocytes Absolute: 0.7 10*3/uL (ref 0.1–1.7)
Monocytes: 8.1 % (ref 3.0–15.0)
Neutrophils %: 70.4 % (ref 42.0–78.0)
Neutrophils Absolute: 6.1 10*3/uL (ref 1.7–8.6)
PLT CT: 225 10*3/uL (ref 130–440)
RBC: 4.11 10*6/uL (ref 4.00–5.70)
RDW: 13.2 % (ref 11.0–14.0)
WBC: 8.6 10*3/uL (ref 4.0–11.0)

## 2018-05-06 LAB — PT AND APTT
PT INR: 1.1 (ref 0.5–1.3)
PT: 10.9 s (ref 9.5–11.5)
aPTT: 30.8 s (ref 24.0–34.0)

## 2018-05-06 LAB — COMPREHENSIVE METABOLIC PANEL
ALT: 32 U/L (ref 0–55)
AST (SGOT): 26 U/L (ref 10–42)
Albumin/Globulin Ratio: 1.38 Ratio (ref 0.80–2.00)
Albumin: 4.4 gm/dL (ref 3.5–5.0)
Alkaline Phosphatase: 95 U/L (ref 40–145)
Anion Gap: 13.3 mMol/L (ref 7.0–18.0)
BUN / Creatinine Ratio: 23.5 Ratio (ref 10.0–30.0)
BUN: 24 mg/dL — ABNORMAL HIGH (ref 7–22)
Bilirubin, Total: 1 mg/dL (ref 0.1–1.2)
CO2: 23.8 mMol/L (ref 20.0–30.0)
Calcium: 8.7 mg/dL (ref 8.5–10.5)
Chloride: 106 mMol/L (ref 98–110)
Creatinine: 1.02 mg/dL (ref 0.80–1.30)
EGFR: 79 mL/min/{1.73_m2} (ref 60–150)
Globulin: 3.2 gm/dL (ref 2.0–4.0)
Glucose: 88 mg/dL (ref 71–99)
Osmolality Calc: 281 mOsm/kg (ref 275–300)
Potassium: 4.1 mMol/L (ref 3.5–5.3)
Protein, Total: 7.6 gm/dL (ref 6.0–8.3)
Sodium: 139 mMol/L (ref 136–147)

## 2018-05-06 LAB — TYPE AND SCREEN
AB Screen: NEGATIVE
ABO Rh: A POS

## 2018-05-06 NOTE — H&P (Signed)
ADMISSION HISTORY AND PHYSICAL EXAM    Date Time: 05/06/18 3:08 PM  Patient Name: [name]  Attending Physician: Barkley Bruns*  Primary Care Provider: Md, Out Of Network    Candelaria Stagers,  MD   preop clearance is pending.  Cardiologist: Laqueta Carina, MD cardiology clearance is scheduled for 05/13/2018.         Assessment:   Osteoarthritis right hip, coronary artery disease, history of atrial flutter, bilateral carpal tunnel syndrome, type 2 diabetes mellitus on oral agent, hypertension, history of myocardial infarction, psoriasis, chronic bilateral lower extremity edema, hypertensive retinopathy, TIA type syndrome in 2007    Plan:   Patient is scheduled to undergo a right total hip arthroplasty (ASI) by Dr. Emeline Darling on 05/19/2018.    History of Present Illness:   [name] is a pleasant 62 y.o. male suffering from right groin and hip pain for greater than 5 years.  Pain has gotten progressively worse.  His pain increases when he goes from a seated to a standing position.  He is limited in how far he can walk due to the pain.  He uses a cane for ambulation.  He limits his activities due to the hip pain.  According to Dr. Ellyn Hack note, imaging reveals osteoarthritis of the right hip.  He has failed conservative treatments such as massage therapy, over-the-counter pain medication, use of a cane, activity modification, exercises, and time.  Patient was seen in the [facility] clinic on 02/18/2018, but he had to be rescheduled due to a lesion on his left ankle.  The lesion appears to be resolved, and his PCP will be writing a letter of clearance.  He presents today for a preop history and physical in anticipation of having the above-named surgery.  For additional details of the history of present illness he is referred to Dr. Ellyn Hack note.    Past Medical History:     Past Medical History:   Diagnosis Date   . Atrial flutter    . CAD (coronary artery disease)    . Carpal tunnel syndrome     H/O   . DM2  (diabetes mellitus, type 2)    . ED (erectile dysfunction)    . Encounter for blood transfusion    . Hypertension    . MI (myocardial infarction) '08, '11   . OA (osteoarthritis)    . Psoriasis        Past Surgical History:     Past Surgical History:   Procedure Laterality Date   . BICEPS TENDON REPAIR Right    . CARDIOVERSION     . CORONARY ARTERY BYPASS GRAFT  2011   . CYST REMOVAL      RIGHT CHEEK   . INGUINAL HERNIA REPAIR Right     AS CHILD   . KNEE ARTHROTOMY Left 1975   . ORIF, RADIUS, DISTAL (WRIST) Right    . SINUS SURGERY         Family History:   Mother-died from renal failure and a history of myocardial infarction.  Father-died from a myocardial infarction.      Social History:   Tobacco-never  ETOH-drinks 1 beer per day.  Illicit Drugs-denies  Married-no  Scientist, research (life sciences)  The patient lives in [place].  His friend will be helping him here in [place] for the first few weeks postop.  Plans on then going home and his brother will assist him.      Allergies:   No Known Allergies  Medications:     Current/Home Medications    AMLODIPINE (NORVASC) 5 MG TABLET    Take 10 mg by mouth daily        APIXABAN (ELIQUIS) 5 MG    Take 5 mg by mouth every 12 (twelve) hours        CARVEDILOL (COREG) 6.25 MG TABLET    TAKE 1 TABLET BY MOUTH TWICE A DAY WITH A MEAL    EMPAGLIFLOZIN (JARDIANCE) 10 MG TABLET    TAKE 10 MG BY MOUTH DAILY.    FUROSEMIDE (LASIX) 20 MG TABLET    Take 40 mg by mouth as needed    GLIPIZIDE (GLUCOTROL) 10 MG TABLET    TAKE 1 TABLET BY MOUTH TWICE A DAY BEFORE A MEAL    LOSARTAN (COZAAR) 50 MG TABLET    Take 50 mg by mouth daily        POTASSIUM CHLORIDE (MICRO-K) 10 MEQ CR CAPSULE    Take 10 mEq by mouth daily        SITAGLIPTIN-METFORMIN (JANUMET) 50-500 MG TABLET    Take 1 tablet by mouth 2 (two) times daily with meals        TIZANIDINE (ZANAFLEX) 4 MG CAPSULE    Take 4 mg by mouth 2 (two) times daily           Review of Systems:   A comprehensive review of systems  was:     INFECTIOUS DISEASE:   Nares and groin cultures were negative from 04/11/2018.    ANESTHESIA REVIEW:    There is no personal or family history of anesthesia problems in the past.  Patient can achieve greater than 4 METS of activity.    SLEEP APNEA:    There is no history of sleep apnea, pt does snore, and denies gasping for breath while asleep.     GENERAL:  There has not been weight gain or loss of >10# in the last six months.   Pt denies fever, chills, or excessive fatigue.    SKIN:  Denies open sores, skin lesions or rashes.  HEENT:  Denies dental pain, bleeding gums, headache, visual changes, dizziness, nosebleeds,  sore throat.  RESPIRATORY:  Denies cough, dyspnea, wheezing or hemoptysis. Patient denies any PFTs in the last five years.  CARDIOVASCULAR:  Denies chest pain, palpitations or syncope.  Positive for coronary artery disease, history of myocardial infarction, and atrial flutter.  Patient underwent CABG x4 in 2011.  Echo from August 2018 was read as patient appears to be in atrial fibrillation, normal LV size and moderate LV hypertrophy, EF of 60 to 65%, mildly dilated RV with normal systolic function, bilateral atrial enlargement, mild pulmonary hypertension, and dilated IVC.  ENDOCRINE:   Denies heat or cold intolerance, polyuria, polydipsia or polyphagia.  Positive for type 2 diabetes mellitus on oral agents.  A1c was 5.2% from 04/11/2018.  GASTROINTESTINAL:  Denies nausea, vomiting, diarrhea, constipation, abdominal pain, dysphagia, melena or hematochezia.  GENITOURINARY:  Denies dysuria, frequency, hematuria, urgency, nocturia, urinary incontinence.  Patient denies BPH symptoms or history of urinary retention.  HEMATOLOGICAL:  Denies bleeding disorders, blood clots.  Positive for history of a blood transfusion and denies any adverse reaction to the blood.  MUSCULOSKELETAL: Positive for right groin and hip pain.  NEUROLOGICAL:  Denies seizures.  Patient had TIA type symptoms in 2007, but he  advises his doctor told him it was not a stroke.  Positive for bilateral carpal syndrome.  PSYCHOLOGICAL:  Denies depression, anxiety or difficulty with  memory.  ONCOLOGICAL:  Denies hx of cancer     Physical Exam:   Patient Vitals for the past 24 hrs:   BP Pulse SpO2 Height Weight   05/06/18 1452 158/87 60 95 % - -   05/06/18 1441 - - - 1.93 m (6\' 4" ) 140.6 kg (310 lb)       Body mass index is 37.73 kg/m.        Mental status - alert, oriented to person, place, and time  HEENT - Head is atraumatic and normocephalic, Pupils are PERRLA, EOMs intact. Neck is supple, trachea midline. No JVD, thyromegaly, or carotid bruits.   Lymphatics - not examined  Lungs - clear to auscultation bilat, no wheezes, rales or rhonchi.   Heart - normal rate, irregular rhythm, normal S1, S2, no murmurs, rubs, clicks or gallops  Abdomen - soft, nontender.  Obese.  GU exam deferred  Extremities - 2+ bilat radial pulses, bilat posterior tibial pulses obscured by edema, without clubbing, cyanosis.  2+ edema in bilateral ankles.  Neurological - Cranial nerves II-XII intact, strength 5/5 bilat in upper and lower extremities. Sensation intact to light touch globally, but decreased in the first 3 fingers on both hands.  Skin -  skin over the right groin and hip is warm, dry, intact and clear of any scratches, excoriations, lesions or rashes.        Signed by:  Paulla Dolly III, CFNP

## 2018-05-06 NOTE — Pre-Procedure Instructions (Signed)
ECHO DONE 07/03/2017 - IN EPIC CARE EVERYWHERE  DENIES STRESS TEST OR PFT'S IN LAST 5 YEARS  DENIES ACUTE CARDIOPULMONARY PROBLEMS
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
## 2018-05-13 ENCOUNTER — Encounter

## 2018-05-13 ENCOUNTER — Encounter: Payer: Self-pay | Admitting: Physician Assistant

## 2018-05-13 ENCOUNTER — Ambulatory Visit: Payer: BLUE CROSS/BLUE SHIELD | Admitting: Physician Assistant

## 2018-05-13 ENCOUNTER — Encounter: Payer: Self-pay | Admitting: Student

## 2018-05-13 VITALS — BP 106/72 | HR 69 | Ht 76.0 in | Wt 312.8 lb

## 2018-05-13 DIAGNOSIS — E785 Hyperlipidemia, unspecified: Secondary | ICD-10-CM | POA: Diagnosis not present

## 2018-05-13 DIAGNOSIS — I10 Essential (primary) hypertension: Secondary | ICD-10-CM

## 2018-05-13 DIAGNOSIS — Z01818 Encounter for other preprocedural examination: Secondary | ICD-10-CM | POA: Diagnosis not present

## 2018-05-13 DIAGNOSIS — I25700 Atherosclerosis of coronary artery bypass graft(s), unspecified, with unstable angina pectoris: Secondary | ICD-10-CM

## 2018-05-13 DIAGNOSIS — I484 Atypical atrial flutter: Secondary | ICD-10-CM

## 2018-05-13 NOTE — Patient Instructions (Signed)
Your physician has recommended you make the following change in your medication: TAKE FUROSEMIDE 40 MG FOR 3 DAYS THE RESUME TO AS NEEDED   Your physician wants you to follow-up in: 6 MONTHS WITH DR Elease HashimotoNAHSER   You will receive a reminder letter in the mail two months in advance. If you don't receive a letter, please call our office to schedule the follow-up appointment.

## 2018-05-14 ENCOUNTER — Telehealth: Payer: Self-pay | Admitting: Nurse Practitioner

## 2018-05-14 NOTE — Telephone Encounter (Signed)
   [facility] Pre-operative Risk Assessment    Request for surgical clearance:  1. What type of surgery is being performed? Right THR, ASI   2. When is this surgery scheduled? May 19, 2018   3. What type of clearance is required (medical clearance vs. Pharmacy clearance to hold med vs. Both)? Not Indicated  4. Are there any medications that need to be held prior to surgery and how long? None Listed   5. Practice name and name of physician performing surgery? [facility]    6. What is your office phone number [phone]    7.   What is your office fax number [phone]  8.   Anesthesia type (None, local, MAC, general) ? Not Indicated   Steve [name] 05/14/2018, 11:55 AM  _________________________________________________________________   (provider comments below)   PREOP Team: Please see office visit with [place], [place] on 05/13/18

## 2018-05-14 NOTE — Telephone Encounter (Signed)
   Primary Cardiologist:Dr. Nahser  Chart reviewed as part of pre-operative protocol coverage. Pt was seen in our clinic yesterday, 05/13/18, and cleared by provider.  Given past medical history and time since last visit, based on ACC/AHA guidelines, Steve [name] would be at acceptable risk for the planned procedure without further cardiovascular testing.  I will route this recommendation to the requesting party via Epic fax function and remove from pre-op pool.  Please call with questions.  Steve LisBrittainy Simmons, PA-C 05/14/2018, 1:31 PM

## 2018-05-15 ENCOUNTER — Encounter: Payer: Self-pay | Admitting: Student

## 2018-05-15 DIAGNOSIS — M1611 Unilateral primary osteoarthritis, right hip: Secondary | ICD-10-CM | POA: Insufficient documentation

## 2018-05-15 DIAGNOSIS — M25551 Pain in right hip: Secondary | ICD-10-CM | POA: Insufficient documentation

## 2018-05-15 NOTE — Progress Notes (Signed)
Preop clearance letter sent to his orthopedic surgeon.

## 2018-05-19 ENCOUNTER — Inpatient Hospital Stay: Payer: BC Managed Care – PPO

## 2018-05-19 ENCOUNTER — Observation Stay
Admission: RE | Admit: 2018-05-19 | Discharge: 2018-05-20 | Disposition: A | Discharge status: Home / Self Care | Payer: BC Managed Care – PPO | Attending: Orthopaedic Surgery | Admitting: Orthopaedic Surgery

## 2018-05-19 ENCOUNTER — Encounter
Admission: RE | Disposition: A | Discharge status: Home / Self Care | Payer: Self-pay | Source: Home / Self Care | Attending: Orthopaedic Surgery

## 2018-05-19 ENCOUNTER — Inpatient Hospital Stay: Payer: BC Managed Care – PPO | Admitting: Anesthesiology

## 2018-05-19 DIAGNOSIS — I2510 Atherosclerotic heart disease of native coronary artery without angina pectoris: Secondary | ICD-10-CM | POA: Insufficient documentation

## 2018-05-19 DIAGNOSIS — I4892 Unspecified atrial flutter: Secondary | ICD-10-CM | POA: Insufficient documentation

## 2018-05-19 DIAGNOSIS — E119 Type 2 diabetes mellitus without complications: Secondary | ICD-10-CM | POA: Insufficient documentation

## 2018-05-19 DIAGNOSIS — I252 Old myocardial infarction: Secondary | ICD-10-CM | POA: Insufficient documentation

## 2018-05-19 DIAGNOSIS — H35039 Hypertensive retinopathy, unspecified eye: Secondary | ICD-10-CM | POA: Insufficient documentation

## 2018-05-19 DIAGNOSIS — Z951 Presence of aortocoronary bypass graft: Secondary | ICD-10-CM | POA: Insufficient documentation

## 2018-05-19 DIAGNOSIS — M1611 Unilateral primary osteoarthritis, right hip: Principal | ICD-10-CM | POA: Insufficient documentation

## 2018-05-19 DIAGNOSIS — Z7901 Long term (current) use of anticoagulants: Secondary | ICD-10-CM | POA: Insufficient documentation

## 2018-05-19 DIAGNOSIS — Z8673 Personal history of transient ischemic attack (TIA), and cerebral infarction without residual deficits: Secondary | ICD-10-CM | POA: Insufficient documentation

## 2018-05-19 DIAGNOSIS — I10 Essential (primary) hypertension: Secondary | ICD-10-CM | POA: Insufficient documentation

## 2018-05-19 DIAGNOSIS — L409 Psoriasis, unspecified: Secondary | ICD-10-CM | POA: Insufficient documentation

## 2018-05-19 DIAGNOSIS — Z8249 Family history of ischemic heart disease and other diseases of the circulatory system: Secondary | ICD-10-CM | POA: Insufficient documentation

## 2018-05-19 DIAGNOSIS — Z7984 Long term (current) use of oral hypoglycemic drugs: Secondary | ICD-10-CM | POA: Insufficient documentation

## 2018-05-19 DIAGNOSIS — Z841 Family history of disorders of kidney and ureter: Secondary | ICD-10-CM | POA: Insufficient documentation

## 2018-05-19 LAB — TYPE AND SCREEN
AB Screen: NEGATIVE
ABO Rh: A POS

## 2018-05-19 LAB — VH DEXTROSE STICK GLUCOSE
Glucose POCT: 197 mg/dL — ABNORMAL HIGH (ref 71–99)
Glucose POCT: 261 mg/dL — ABNORMAL HIGH (ref 71–99)
Glucose POCT: 236 mg/dL — ABNORMAL HIGH (ref 71–99)
Glucose POCT: 209 mg/dL — ABNORMAL HIGH (ref 71–99)

## 2018-05-19 LAB — HEMOGLOBIN A1C: Hgb A1C, %: 5.5 %

## 2018-05-19 SURGERY — ARTHROPLASTY, HIP, TOTAL, ANTERIOR APPROACH, C ARM
Anesthesia: Anesthesia General | Site: Hip | Laterality: Right | Wound class: Clean

## 2018-05-19 MED ORDER — PROMETHAZINE HCL 12.5 MG RE SUPP
12.5000 mg | Freq: Four times a day (QID) | RECTAL | Status: DC | PRN
Start: 2018-05-19 — End: 2018-05-20

## 2018-05-19 MED ORDER — SUGAMMADEX SODIUM 200 MG/2ML IV SOLN
INTRAVENOUS | Status: AC
Start: 2018-05-19 — End: ?
  Filled 2018-05-19: qty 4

## 2018-05-19 MED ORDER — OXYCODONE HCL 5 MG PO TABS
5.0000 mg | ORAL_TABLET | ORAL | Status: DC | PRN
Start: 2018-05-19 — End: 2018-05-20

## 2018-05-19 MED ORDER — LACTATED RINGERS IV SOLN
INTRAVENOUS | Status: DC
Start: 2018-05-19 — End: 2018-05-20

## 2018-05-19 MED ORDER — VH PROMETHAZINE HCL 25 MG/ML IM SOLN
6.2500 mg | Freq: Four times a day (QID) | INTRAMUSCULAR | Status: DC | PRN
Start: 2018-05-19 — End: 2018-05-20

## 2018-05-19 MED ORDER — PATIENT SUPPLIED NON FORMULARY
10.00 mg | Freq: Every day | Status: DC
Start: 2018-05-19 — End: 2018-05-20

## 2018-05-19 MED ORDER — MORPHINE SULFATE (PF) 0.5 MG/ML IJ SOLN
INTRAMUSCULAR | Status: AC
Start: 2018-05-19 — End: ?
  Filled 2018-05-19: qty 10

## 2018-05-19 MED ORDER — SUGAMMADEX SODIUM 200 MG/2ML IV SOLN
INTRAVENOUS | Status: DC | PRN
Start: 2018-05-19 — End: 2018-05-19
  Administered 2018-05-19: 300 mg via INTRAVENOUS

## 2018-05-19 MED ORDER — GLUCAGON 1 MG IJ SOLR (WRAP)
1.00 mg | INTRAMUSCULAR | Status: DC | PRN
Start: 2018-05-19 — End: 2018-05-20

## 2018-05-19 MED ORDER — INSULIN LISPRO 100 UNIT/ML SC SOPN
1.00 [IU] | PEN_INJECTOR | Freq: Every evening | SUBCUTANEOUS | Status: DC
Start: 2018-05-19 — End: 2018-05-20
  Administered 2018-05-19: 23:00:00 2 [IU] via SUBCUTANEOUS

## 2018-05-19 MED ORDER — MORPHINE SULFATE (PF) 0.5 MG/ML IJ SOLN
INTRAMUSCULAR | Status: DC | PRN
Start: 2018-05-19 — End: 2018-05-19
  Administered 2018-05-19: 5 mg via INTRAMUSCULAR

## 2018-05-19 MED ORDER — ACETAMINOPHEN 325 MG PO TABS
650.00 mg | ORAL_TABLET | ORAL | Status: AC
Start: 2018-05-19 — End: ?

## 2018-05-19 MED ORDER — VH HYDROMORPHONE HCL PF 1 MG/ML CARPUJECT
0.5000 mg | INTRAMUSCULAR | Status: DC | PRN
Start: 2018-05-19 — End: 2018-05-19

## 2018-05-19 MED ORDER — MIDAZOLAM HCL 2 MG/2ML IJ SOLN
INTRAMUSCULAR | Status: AC
Start: 2018-05-19 — End: ?
  Filled 2018-05-19: qty 2

## 2018-05-19 MED ORDER — SODIUM CHLORIDE 0.9 % IV SOLN
Freq: Once | INTRAVENOUS | Status: DC
Start: 2018-05-19 — End: 2018-05-19
  Filled 2018-05-19: qty 0.25

## 2018-05-19 MED ORDER — LACTATED RINGERS IV SOLN
INTRAVENOUS | Status: DC
Start: 2018-05-19 — End: 2018-05-19

## 2018-05-19 MED ORDER — LACTATED RINGERS IV SOLN
125.0000 mL/h | INTRAVENOUS | Status: DC
Start: 2018-05-19 — End: 2018-05-19

## 2018-05-19 MED ORDER — PROPOFOL 200 MG/20ML IV EMUL
INTRAVENOUS | Status: AC
Start: 2018-05-19 — End: ?
  Filled 2018-05-19: qty 20

## 2018-05-19 MED ORDER — INSULIN LISPRO 100 UNIT/ML SC SOPN
2.00 [IU] | PEN_INJECTOR | Freq: Three times a day (TID) | SUBCUTANEOUS | Status: DC
Start: 2018-05-19 — End: 2018-05-20
  Administered 2018-05-19: 18:00:00 4 [IU] via SUBCUTANEOUS
  Administered 2018-05-19 – 2018-05-20 (×3): 2 [IU] via SUBCUTANEOUS
  Filled 2018-05-19: qty 3

## 2018-05-19 MED ORDER — TRANEXAMIC ACID 1000 MG/10ML IV SOLN
1000.0000 mg | Freq: Once | INTRAVENOUS | Status: DC
Start: 2018-05-19 — End: 2018-05-19
  Filled 2018-05-19: qty 10

## 2018-05-19 MED ORDER — SODIUM CHLORIDE 0.9 % IV SOLN
INTRAVENOUS | Status: DC | PRN
Start: 2018-05-19 — End: 2018-05-19
  Administered 2018-05-19: 10:00:00 81.25 mL via INTRA_ARTICULAR

## 2018-05-19 MED ORDER — TIZANIDINE HCL 4 MG PO TABS
4.00 mg | ORAL_TABLET | Freq: Two times a day (BID) | ORAL | Status: DC
Start: 2018-05-19 — End: 2018-05-20
  Administered 2018-05-19 – 2018-05-20 (×3): 4 mg via ORAL
  Filled 2018-05-19 (×3): qty 1

## 2018-05-19 MED ORDER — SITAGLIPTIN PHOSPHATE 50 MG PO TABS
50.00 mg | ORAL_TABLET | Freq: Two times a day (BID) | ORAL | Status: DC
Start: 2018-05-19 — End: 2018-05-20
  Administered 2018-05-19 – 2018-05-20 (×3): 50 mg via ORAL
  Filled 2018-05-19 (×3): qty 1

## 2018-05-19 MED ORDER — FENTANYL CITRATE (PF) 50 MCG/ML IJ SOLN (WRAP)
INTRAMUSCULAR | Status: AC
Start: 2018-05-19 — End: ?
  Filled 2018-05-19: qty 2

## 2018-05-19 MED ORDER — INSULIN LISPRO 100 UNIT/ML SC SOPN
1.00 [IU] | PEN_INJECTOR | Freq: Every day | SUBCUTANEOUS | Status: DC | PRN
Start: 2018-05-19 — End: 2018-05-20
  Administered 2018-05-20: 03:00:00 1 [IU] via SUBCUTANEOUS

## 2018-05-19 MED ORDER — DEXTROSE 10 % IV BOLUS
125.00 mL | INTRAVENOUS | Status: DC | PRN
Start: 2018-05-19 — End: 2018-05-20

## 2018-05-19 MED ORDER — ALBUTEROL SULFATE HFA 108 (90 BASE) MCG/ACT IN AERS
INHALATION_SPRAY | RESPIRATORY_TRACT | Status: DC | PRN
Start: 2018-05-19 — End: 2018-05-19
  Administered 2018-05-19: 4 via RESPIRATORY_TRACT

## 2018-05-19 MED ORDER — ROCURONIUM BROMIDE 50 MG/5ML IV SOLN
INTRAVENOUS | Status: AC
Start: 2018-05-19 — End: ?
  Filled 2018-05-19: qty 5

## 2018-05-19 MED ORDER — GLIPIZIDE 5 MG PO TABS
10.00 mg | ORAL_TABLET | Freq: Two times a day (BID) | ORAL | Status: DC
Start: 2018-05-19 — End: 2018-05-19
  Filled 2018-05-19: qty 2

## 2018-05-19 MED ORDER — HYDROMORPHONE HCL 0.5 MG/0.5 ML IJ SOLN
0.5000 mg | INTRAMUSCULAR | Status: DC | PRN
Start: 2018-05-19 — End: 2018-05-20

## 2018-05-19 MED ORDER — DEXAMETHASONE SODIUM PHOSPHATE 4 MG/ML IJ SOLN
INTRAMUSCULAR | Status: AC
Start: 2018-05-19 — End: ?
  Filled 2018-05-19: qty 2

## 2018-05-19 MED ORDER — CARVEDILOL 6.25 MG PO TABS
6.25 mg | ORAL_TABLET | Freq: Two times a day (BID) | ORAL | Status: DC
Start: 2018-05-19 — End: 2018-05-19
  Filled 2018-05-19: qty 1

## 2018-05-19 MED ORDER — LIDOCAINE HCL (PF) 2 % IJ SOLN
INTRAMUSCULAR | Status: DC | PRN
Start: 2018-05-19 — End: 2018-05-19
  Administered 2018-05-19: 50 mg via INTRAVENOUS

## 2018-05-19 MED ORDER — FENTANYL CITRATE (PF) 50 MCG/ML IJ SOLN (WRAP)
25.0000 ug | INTRAMUSCULAR | Status: DC | PRN
Start: 2018-05-19 — End: 2018-05-19
  Administered 2018-05-19: 25 ug via INTRAVENOUS
  Filled 2018-05-19: qty 2

## 2018-05-19 MED ORDER — ONDANSETRON HCL 4 MG/2ML IJ SOLN
4.0000 mg | Freq: Once | INTRAMUSCULAR | Status: DC | PRN
Start: 2018-05-19 — End: 2018-05-19

## 2018-05-19 MED ORDER — PROMETHAZINE HCL 25 MG PO TABS
25.0000 mg | ORAL_TABLET | Freq: Four times a day (QID) | ORAL | Status: DC | PRN
Start: 2018-05-19 — End: 2018-05-20

## 2018-05-19 MED ORDER — ACETAMINOPHEN 10 MG/ML IV SOLN
INTRAVENOUS | Status: DC | PRN
Start: 2018-05-19 — End: 2018-05-19
  Administered 2018-05-19: 1000 mg via INTRAVENOUS

## 2018-05-19 MED ORDER — LOSARTAN POTASSIUM 50 MG PO TABS
50.00 mg | ORAL_TABLET | Freq: Every day | ORAL | Status: DC
Start: 2018-05-19 — End: 2018-05-20
  Administered 2018-05-20: 11:00:00 50 mg via ORAL
  Filled 2018-05-19 (×2): qty 1

## 2018-05-19 MED ORDER — HALOPERIDOL LACTATE 5 MG/ML IJ SOLN
1.0000 mg | Freq: Once | INTRAMUSCULAR | Status: DC | PRN
Start: 2018-05-19 — End: 2018-05-19
  Filled 2018-05-19: qty 0.2

## 2018-05-19 MED ORDER — SODIUM CHLORIDE 0.9 % IV SOLN
INTRAVENOUS | Status: DC | PRN
Start: 2018-05-19 — End: 2018-05-19

## 2018-05-19 MED ORDER — PATIENT SUPPLIED NON FORMULARY
1.00 | Freq: Two times a day (BID) | Status: DC
Start: 2018-05-19 — End: 2018-05-19

## 2018-05-19 MED ORDER — FUROSEMIDE 40 MG PO TABS
40.00 mg | ORAL_TABLET | Freq: Every day | ORAL | Status: DC
Start: 2018-05-19 — End: 2018-05-20
  Administered 2018-05-20: 11:00:00 40 mg via ORAL
  Filled 2018-05-19 (×2): qty 1

## 2018-05-19 MED ORDER — TRANEXAMIC ACID 1000 MG/10ML IV SOLN
INTRAVENOUS | Status: DC | PRN
Start: 2018-05-19 — End: 2018-05-19
  Administered 2018-05-19: 10:00:00 3000 mg via INTRA_ARTICULAR

## 2018-05-19 MED ORDER — NALOXONE HCL 0.4 MG/ML IJ SOLN (WRAP)
0.4000 mg | INTRAMUSCULAR | Status: DC | PRN
Start: 2018-05-19 — End: 2018-05-20

## 2018-05-19 MED ORDER — TRANEXAMIC ACID 1000 MG/10ML IV SOLN
3000.00 mg | Freq: Once | INTRAVENOUS | Status: DC
Start: 2018-05-19 — End: 2018-05-20
  Filled 2018-05-19: qty 30

## 2018-05-19 MED ORDER — STERILE WATER FOR INJECTION IJ SOLN
2.0000 g | Freq: Three times a day (TID) | INTRAMUSCULAR | Status: AC
Start: 2018-05-19 — End: 2018-05-20
  Administered 2018-05-19 – 2018-05-20 (×2): 2 g via INTRAVENOUS
  Filled 2018-05-19: qty 2000
  Filled 2018-05-19 (×2): qty 20
  Filled 2018-05-19: qty 2000

## 2018-05-19 MED ORDER — ROCURONIUM BROMIDE 50 MG/5ML IV SOLN
INTRAVENOUS | Status: DC | PRN
Start: 2018-05-19 — End: 2018-05-19
  Administered 2018-05-19: 50 mg via INTRAVENOUS
  Administered 2018-05-19 (×2): 20 mg via INTRAVENOUS
  Administered 2018-05-19: 50 mg via INTRAVENOUS

## 2018-05-19 MED ORDER — EPHEDRINE SULFATE 50 MG/ML IJ/IV SOLN (WRAP)
Status: AC
Start: 2018-05-19 — End: ?
  Filled 2018-05-19: qty 1

## 2018-05-19 MED ORDER — METFORMIN HCL 500 MG PO TABS
500.00 mg | ORAL_TABLET | Freq: Two times a day (BID) | ORAL | Status: DC
Start: 2018-05-19 — End: 2018-05-20
  Administered 2018-05-19 – 2018-05-20 (×3): 500 mg via ORAL
  Filled 2018-05-19 (×3): qty 1

## 2018-05-19 MED ORDER — GLIPIZIDE 5 MG PO TABS
5.00 mg | ORAL_TABLET | Freq: Two times a day (BID) | ORAL | Status: DC
Start: 2018-05-19 — End: 2018-05-20
  Administered 2018-05-19 – 2018-05-20 (×3): 5 mg via ORAL
  Filled 2018-05-19 (×3): qty 1

## 2018-05-19 MED ORDER — MIDAZOLAM HCL 2 MG/2ML IJ SOLN
INTRAMUSCULAR | Status: DC | PRN
Start: 2018-05-19 — End: 2018-05-19
  Administered 2018-05-19: 2 mg via INTRAVENOUS

## 2018-05-19 MED ORDER — DIPHENHYDRAMINE HCL 25 MG PO CAPS
25.0000 mg | ORAL_CAPSULE | Freq: Two times a day (BID) | ORAL | Status: DC | PRN
Start: 2018-05-19 — End: 2018-05-20

## 2018-05-19 MED ORDER — FENTANYL CITRATE (PF) 50 MCG/ML IJ SOLN (WRAP)
INTRAMUSCULAR | Status: DC | PRN
Start: 2018-05-19 — End: 2018-05-19
  Administered 2018-05-19 (×4): 50 ug via INTRAVENOUS

## 2018-05-19 MED ORDER — TRAMADOL HCL 50 MG PO TABS
50.00 mg | ORAL_TABLET | Freq: Four times a day (QID) | ORAL | 0 refills | Status: AC
Start: 2018-05-19 — End: 2018-05-26

## 2018-05-19 MED ORDER — VH PHENYLEPHRINE 120 MCG/ML IV BOLUS (ANESTHESIA)
PREFILLED_SYRINGE | INTRAVENOUS | Status: DC | PRN
Start: 2018-05-19 — End: 2018-05-19
  Administered 2018-05-19 (×3): 120 ug via INTRAVENOUS

## 2018-05-19 MED ORDER — ONDANSETRON HCL 4 MG/2ML IJ SOLN
INTRAMUSCULAR | Status: DC | PRN
Start: 2018-05-19 — End: 2018-05-19
  Administered 2018-05-19: 4 mg via INTRAVENOUS

## 2018-05-19 MED ORDER — PROPOFOL 200 MG/20ML IV EMUL
INTRAVENOUS | Status: DC | PRN
Start: 2018-05-19 — End: 2018-05-19
  Administered 2018-05-19: 200 mg via INTRAVENOUS

## 2018-05-19 MED ORDER — ONDANSETRON HCL 4 MG/2ML IJ SOLN
INTRAMUSCULAR | Status: AC
Start: 2018-05-19 — End: ?
  Filled 2018-05-19: qty 2

## 2018-05-19 MED ORDER — ACETAMINOPHEN 325 MG PO TABS
650.0000 mg | ORAL_TABLET | ORAL | Status: DC
Start: 2018-05-19 — End: 2018-05-20
  Administered 2018-05-19 – 2018-05-20 (×6): 650 mg via ORAL
  Filled 2018-05-19 (×12): qty 2

## 2018-05-19 MED ORDER — DEXAMETHASONE SODIUM PHOSPHATE 4 MG/ML IJ SOLN (WRAP)
INTRAMUSCULAR | Status: DC | PRN
Start: 2018-05-19 — End: 2018-05-19
  Administered 2018-05-19: 10 mg via INTRAVENOUS

## 2018-05-19 MED ORDER — ONDANSETRON 4 MG PO TBDP
4.0000 mg | ORAL_TABLET | Freq: Three times a day (TID) | ORAL | Status: DC | PRN
Start: 2018-05-19 — End: 2018-05-20

## 2018-05-19 MED ORDER — AMLODIPINE BESYLATE 5 MG PO TABS
5.00 mg | ORAL_TABLET | Freq: Every day | ORAL | Status: DC
Start: 2018-05-20 — End: 2018-05-20
  Administered 2018-05-20: 11:00:00 5 mg via ORAL
  Filled 2018-05-19: qty 1

## 2018-05-19 MED ORDER — METOCLOPRAMIDE HCL 5 MG/ML IJ SOLN
10.0000 mg | Freq: Once | INTRAMUSCULAR | Status: DC | PRN
Start: 2018-05-19 — End: 2018-05-19

## 2018-05-19 MED ORDER — ACETAMINOPHEN 325 MG PO TABS
650.0000 mg | ORAL_TABLET | ORAL | Status: DC | PRN
Start: 2018-05-19 — End: 2018-05-20

## 2018-05-19 MED ORDER — TRAMADOL HCL 50 MG PO TABS
50.0000 mg | ORAL_TABLET | Freq: Four times a day (QID) | ORAL | Status: DC
Start: 2018-05-19 — End: 2018-05-20
  Administered 2018-05-19 – 2018-05-20 (×5): 50 mg via ORAL
  Filled 2018-05-19 (×6): qty 1

## 2018-05-19 MED ORDER — AMLODIPINE BESYLATE 5 MG PO TABS
10.00 mg | ORAL_TABLET | Freq: Every day | ORAL | Status: DC
Start: 2018-05-19 — End: 2018-05-19
  Filled 2018-05-19: qty 2

## 2018-05-19 MED ORDER — STERILE WATER FOR INJECTION IJ SOLN
2.0000 g | Freq: Once | INTRAMUSCULAR | Status: AC
Start: 2018-05-19 — End: 2018-05-19
  Administered 2018-05-19: 10:00:00 2 g via INTRAVENOUS
  Filled 2018-05-19: qty 2000
  Filled 2018-05-19: qty 20

## 2018-05-19 MED ORDER — OXYCODONE HCL 5 MG PO TABS
5.0000 mg | ORAL_TABLET | Freq: Once | ORAL | Status: DC | PRN
Start: 2018-05-19 — End: 2018-05-19
  Filled 2018-05-19: qty 1

## 2018-05-19 MED ORDER — POLYETHYLENE GLYCOL 3350 17 G PO PACK
17.00 g | PACK | Freq: Two times a day (BID) | ORAL | Status: DC
Start: 2018-05-19 — End: 2018-05-20
  Administered 2018-05-19 – 2018-05-20 (×3): 17 g via ORAL
  Filled 2018-05-19 (×3): qty 1

## 2018-05-19 MED ORDER — APIXABAN 5 MG PO TABS
5.00 mg | ORAL_TABLET | Freq: Two times a day (BID) | ORAL | Status: DC
Start: 2018-05-20 — End: 2018-05-20
  Administered 2018-05-20: 08:00:00 5 mg via ORAL
  Filled 2018-05-19 (×2): qty 1

## 2018-05-19 MED ORDER — DEXAMETHASONE SODIUM PHOSPHATE 4 MG/ML IJ SOLN
INTRAMUSCULAR | Status: AC
Start: 2018-05-19 — End: ?
  Filled 2018-05-19: qty 1

## 2018-05-19 MED ORDER — CARVEDILOL 3.125 MG PO TABS
3.1250 mg | ORAL_TABLET | Freq: Two times a day (BID) | ORAL | Status: DC
Start: 2018-05-19 — End: 2018-05-20
  Administered 2018-05-19 – 2018-05-20 (×2): 3.125 mg via ORAL
  Filled 2018-05-19 (×3): qty 1

## 2018-05-19 MED ORDER — OXYCODONE HCL 5 MG PO TABS
10.0000 mg | ORAL_TABLET | ORAL | Status: DC | PRN
Start: 2018-05-19 — End: 2018-05-20

## 2018-05-19 MED ORDER — BISACODYL 5 MG PO TBEC
5.0000 mg | DELAYED_RELEASE_TABLET | Freq: Every day | ORAL | Status: DC
Start: 2018-05-21 — End: 2018-05-20

## 2018-05-19 MED ORDER — ONDANSETRON HCL 4 MG/2ML IJ SOLN
4.0000 mg | Freq: Three times a day (TID) | INTRAMUSCULAR | Status: DC | PRN
Start: 2018-05-19 — End: 2018-05-20

## 2018-05-19 MED ORDER — OXYCODONE HCL 5 MG PO TABS
5.00 mg | ORAL_TABLET | ORAL | Status: AC | PRN
Start: 2018-05-19 — End: 2018-05-26

## 2018-05-19 MED ORDER — EPHEDRINE SULFATE 50 MG/ML IJ/IV SOLN (WRAP)
Status: DC | PRN
Start: 2018-05-19 — End: 2018-05-19
  Administered 2018-05-19: 10 mg via INTRAVENOUS
  Administered 2018-05-19: 5 mg via INTRAVENOUS
  Administered 2018-05-19 (×2): 10 mg via INTRAVENOUS

## 2018-05-19 MED ORDER — DIPHENHYDRAMINE HCL 25 MG PO CAPS
25.0000 mg | ORAL_CAPSULE | Freq: Every evening | ORAL | Status: DC | PRN
Start: 2018-05-19 — End: 2018-05-20

## 2018-05-19 SURGICAL SUPPLY — 16 items
BLADE SAGITTAL 90X0.97X25 (Supply) ×2 IMPLANT
CAUTERY DISP W/EZ CLEAN TIP (Supply) ×2 IMPLANT
CER BIOLOX OPTION HD 36MM ×2 IMPLANT
DRAPE BACK TABLE TIERED (Supply) ×2 IMPLANT
DRAPE TOP 102IN X 53IN (Supply) ×2 IMPLANT
LINER G7 NEUTRAL E1 36MM G ×2 IMPLANT
PACK ANTERIOR HIP (Supply) ×2 IMPLANT
PILLOW HIP ABDUCT SM 12X18X6 (Supply) ×2 IMPLANT
SEALER BIPOLAR 6.0 (Supply) ×2 IMPLANT
SHELL G7 OSSEOTI 4H 58MM G ×2 IMPLANT
SLEEVE CER OPTION TYPE1 TPR 0 ×2 IMPLANT
SOL SALINE IRRIG 3000ML (Supply) ×2 IMPLANT
SPONGE LAP 18 X 18 (Supply) ×2 IMPLANT
SUT MAXBRAID PE #2 TAPER HC-5 (Supply) ×2 IMPLANT
TOWEL BLUE STERILE 6 PER PK (Supply) ×10 IMPLANT
TPRLC 133 TYPE1 PPS SO 18.0 ×2 IMPLANT

## 2018-05-19 NOTE — Anesthesia Preprocedure Evaluation (Signed)
Anesthesia Evaluation    AIRWAY    Mallampati: III    TM distance: >3 FB  Neck ROM: full  Mouth Opening:full  Planned to use difficult airway equipment: Yes CARDIOVASCULAR    cardiovascular exam normal       DENTAL    no notable dental hx     PULMONARY    pulmonary exam normal     OTHER FINDINGS                  Relevant Problems   No relevant active problems               Anesthesia Plan    ASA 3     general               (Took coreg and amlodipine upon arrival to hospital. Denies sob/cp today.  CAD s/p CABG in 2011.  Cardiology clearance in chart.  Stopped elliquis Thursday.    Informed consent obtained and the discussed possibility of anesthesia complications including but not limited to: aspiration, medication reactions; need for intervention including use of pressors, line placement or invasive procedures.  Discussed possibility of dental damage, laryngospasm, cardiopulmonary issues, positional injuries and anesthesia options.  Pt voiced understanding and wished to proceed with procedure.       Discussed the use of the anesthesia care team model which involves an attending anesthesiologist with the care of a certified registered nurse anesthetist (CRNA).      It should also be noted that due to the nature of the electronic medical record, some data may not be completely accurate due to artifact, inaccurate data entry by other staff, etc.         Update: Patient seen and examined the DOS and ready to proceed.  )      intravenous induction   Detailed anesthesia plan: general endotracheal        Post op pain management: per surgeon    informed consent obtained    Plan discussed with CRNA.                   Signed by: Letitia Neri 05/19/18 8:59 AM

## 2018-05-19 NOTE — Anesthesia Postprocedure Evaluation (Signed)
Anesthesia Post Evaluation    Patient: [name]    Procedures performed: Procedure(s) with comments:  ARTHROPLASTY, HIP, TOTAL, ANTERIOR APPROACH, C ARM - RIGHT TOTAL HIP REPLACEMENT, ASI     Anesthesia type: General        Patient location:Phase I PACU    Post pain: Pain appropriately addressed.     Mental Status:awake and alert     Respiratory Function: tolerating NC    Cardiovascular: stable    Nausea/Vomiting: patient not complaining of nausea or vomiting    Hydration Status: adequate    Last vitals: BP BP: 136/76 , Pulse Heart Rate: 73, RR Resp Rate: 18, Temperature Temp: 36.9 C (98.4 F), Pulse Oximetry   SpO2 Readings from Last 1 Encounters:   05/19/18 98%         Post assessment: no apparent anesthetic complications, no reportable events and no evidence of recall

## 2018-05-19 NOTE — Progress Notes (Signed)
Assessment unchanged from previous nurse

## 2018-05-19 NOTE — OR Nursing (Signed)
Patient attended at all times SCD's applied and on prior to induction. Arms padded and secure. Position verified by surgeon.Spoke with family via [facility], made aware of surgery start and that the patient is doing well.

## 2018-05-19 NOTE — Discharge Summary (Signed)
This patient is a 62-year-old male, who presented to our office with right hip osteoarthritis.  The patient failed all conservative measures and elected to undergo a total hip arthroplasty.  The patient had no perioperative issues and did well postoperatively.  They were transferred to the orthopedic floor where he worked with Physical Therapy.  he was ambulating in the hall, tolerating p.o., voiding and drinking appropriately and their pain was well controlled on p.o. pain meds.  H/H was stable.  he was discharged to home in stable condition with instructions to follow up in two weeks.

## 2018-05-19 NOTE — Discharge Instr - AVS First Page (Addendum)
 Follow instructions as given by Dr. Gore and Physical \ Occupational Therapy.   Above all, feel free to call with any and all concerns or with any symptoms not normal for you.       Elective Anterior Total Hip Replacement       DISCHARGE INSTRUCTIONS FOR ELECTIVE TOTAL HIP REPLACEMENT       WITH ALL ACTIVITIES:   . Do not rotate operative leg outward while extending the hip    ACTIVITY/EXERCISES:       Continue your exercises as instructed by the Physical Therapist 3 times a day. To get the maximum benefit from your new hip, you need to make a serious commitment to your exercises and KEEP ACTIVE. You will have good days and bad days, but keep working hard and you will eventually see the results. Avoid any high impact sports and activities that require running, jerking, pulling, jumping or twisting. Check with your doctor to see when you can return to these previous activities (usually 12 weeks).       WALKING/REACHING:       You may use your walker or crutches to protect your new hip and allow for healing. Walking short distances frequently is better than walking long distances.  Do not reach for anything that is not within close distance. You may begin to use a cane in about 4-6 weeks (your doctor will advise you) if you do not have a limp and your balance is good. Stand tall and walk with your head up and feet pointing straight ahead.      CLIMBING STAIRS:      When going up the stairs, the good leg goes up first then the bad leg comes to the same step. When going down the stairs, the bad leg goes down first, then the good leg comes to the same step. Have someone with you the first few times you maneuver the stairs.         BATHING/TOILETING:      You may shower if there is no drainage from your incision 5 days postop. Keep you mepilex dressing on, it will keep the water out.   Have someone assist the first time you take a shower.  Do not sit in the tub. Your hip should not soak in water. If bathing at the  sink, you may want to sit to avoid losing your balance.  If the dressing becomes soaked in the shower, then change the dressing.     DRESSING:      Do not wear high heels.  Continue to wear the white tight stockings for 3 weeks.     SLEEPING/RESTING:      Do take frequent rest periods during the day. Use pillows between your legs when you lie on your side. It is okay to lie on your operated hip if this is comfortable. You may find that a partial side-lying position feels better. Do not sleep in a water bed.      DRIVING:      Do not drive until approved by your doctor (after off of pain medications and able to move from gas to brake appropriately). It depends on which hip you had surgery and if you require a lot of narcotics.      DIET:      Your diet should be high in protein and carbohydrates unless you are on a special diet (such as a diabetic or renal diet). Eat well balanced meals so that   your body has proper nutrition to heal and restore strength. Examples of foods high in protein are lean meats, poultry, fish, eggs and milk. Examples of foods high in carbohydrates are breads, cereal, pasta, beans, potatoes and vegetables. If taking Coumadin, EXCESSIVE foods high in Vitamin K (i.e. broccoli, green leafy vegetables) may interfere with the Coumadin. You may continue to eat these foods, but not in excessive amounts. Constipation can be a side effect of narcotics taken for pain relief. Drinking lots of water and eating high fiber foods can prevent this from occurring. Examples of these foods are bran, cereals, wheat bread, prunes, apples, and green, leafy vegetables. Taking stool softeners and laxatives may also be needed. Examples are: Metamucil, Senokot, or Milk of Magnesia.      SEXUAL ACTIVITY:      Avoid sexual intercourse for 6 weeks to allow your joint capsule and muscle to heal. Follow the dislocation precautions when you do resume intercourse. Do not hyper-abduct the operative leg and the bottom position  is safer. A pamphlet is available if you want more information.      WOUND (INCISION) CARE:      You may shower after 5 days with the dressing on. Keep that dressing on until your postop appointment unless it is soiled or wet. If drainage is present, the nurse will show you how to change your dressing before you are discharged. Steri- strips (small pieces of tape) are present, they will eventually fall off on their own. Do not pull them off.      MEDICATIONS:      The doctor has written a prescription for your pain medication and any new medications you should take. See your complete list on your discharge summary sheet. Continue taking all of your previous medications unless otherwise indicated. The nurse will instruct you on any changes. If refills are needed, please call the doctor's office Monday-Friday. Do not call during evening hours or on the weekends. If your doctor prescribes Coumadin (a blood thinner), do not take aspirin or products containing aspirin while on the Coumadin. Report any unusual bleeding or bruising to your doctor.      RIDING IN AN AUTOMOBILE:      When getting in to a car, back up to the seat until you feel the seat behind your legs. Reach back until you feel the seat, then sit down and turn your body as someone helps lift your leg into the car. You may find it helpful to sit on pillows which will allow you to sit with your knees slightly flexed and will make it easier when you stand up. Do not ride for longer than 30-45 minutes without stopping and stretching (until you have recovered). Someone may need to help you lift your leg out of the car. Slide to the edge of the seat and then stand up.      RETURNING TO WORK:      Your doctor will decide when you return to work and will discuss it at your follow-up office visit. This will vary depending on the type of work you do.      CONTACT THE DOCTOR FOR THE FOLLOWING:      1. Increased severe pain not relieved by you pain medication.   2.  Increased temperature that CONTINUES to be elevated over 101 degrees.  3. Excessive swelling in your legs or calf tenderness.   4. Drainage from your incision.   5. Severe redness, swelling, or warmth around your incision.     6. Chest pain or shortness of breath.   7. Any other questions you may have.      IMPORTANT:      Before any surgery or dental work, remind your doctor or dentist that you have a total hip prosthesis. Antibiotics are usually needed to prevent the transmission of infection to your hip. Your total hip prosthesis may activate a metal detection device.     SAFETY HINTS:   . Remove scatter rugs and electrical cords to prevent falling.  . Use non-skid mats and handrails in your shower or bath.   . Be careful around your pets. Their jumping may cause you to lose your balance.   NOTE: These discharge instructions are a general guide for patients who have received a total hip replacement. You may have other special needs that are not addressed here. If you have any questions please ask your doctor, the nursing staff, or the physical therapist.      TELEPHONE NUMBERS:   Hospital Main Line...............................................................................[phone]      Orthopaedic Unit.................................................................................[phone]     Physical Therapy Dept.........................................................................[phone]     [facility] [employer]...........................[phone]  [address] [place], [place] [address]    (Drs. Bobby Rumpf, Carlena Sax, T. Greer Pickerel, [facility]..........................[phone]  [address]  [place], [place] [address]  [facility]  Located behind [facility]  (Drs. Bobby Rumpf, Carlena Sax, T. Greer Pickerel, & Zoller)    Mepilex Dressing Care  The  Mepilex dressing is light brown in color (band aide type). It is a waterproof and bacteria proof dressing that has been placed over your incision in the hospital by your nurse.   If excessive drainage is observed at anytime from the incision, daily incision care should be done. Remove the dressing wash the incision with soap and water, pat dry and place a new gauze dressing over the incision. This is to be done daily, if excessive drainage continues after a few days call your doctor.     ---------------------------------------------------------------------------------------------------------------------    PHYSICAL THERAPY INSTRUCTIONS, TAKE WITH YOU TO YOUR FIRST APPOINTMENT    Total Hip Arthroplasty/Direct Anterior  Surgical Approach    Dislocation Precautions: No strenuous external rotation and extension. Ok to flex and internally rotate hip.  Weight lifting restrictions: 20 lbs.    Phase I (1 - 5 days post-op.)  . Wound care: Observe for signs of infection.  Zada Finders for signs of DVT (Homan's) or dislocation.  . Modalities PRN for pain or inflammation (ice, IFC)  . Edema: Cryotherapy following P.T. Elevation. Compression stockings (TED hose)  must be worn until patient exhibits independent, normal gait.   . Gait: Ambulation with walker or 2 crutches on flat surfaces only with weight bearing  as tolerated unless specified by M.D.  Transition to cane when appropriate.  . ROM: AROM/AAROM/PROM: Knee and hip within dislocation precautions.  . Strengthening: Isometric quadriceps, hamstrings, and gluteal exercises. Closed chain  exercises with bilateral upper extremity support, while observing weight bearing restrictions,  if any.    Phase II (5 days - 4 weeks post-op.)  . Continue to observe for signs of infection. Begin scar massage at 4 weeks, no scar massage until then.   . Modalities: Continue PRN  . Edema: Cryotherapy following P.T.. Elevation. Compression stockings (TED hose)  must be worn until patient  exhibits independent, normal gait.   Marland Kitchen  Gait: Based on post-op. WB status  o If WBAT to FWB, may wean to one cane at 1 week post-op.  o If PWB and no assistive device were used preoperatively, wean off assistive device by 2-  6 weeks, if muscle performance is sufficient.  . Balance/Proprioception training: Weight-shifting activities.  . ROM: AROM, AAROM, PROM: Knee and hip within dislocation precautions.  . Strengthening: Continue isometric quadriceps, hamstrings and gluteal exercises. Stationary  cycle or stepper, closed chain exercises and progressive resistance exercises,  weeks 3-4.    Phase III (4 weeks - 10 weeks post-op.)  . Wound: Continue scar mobilizations.  . Modalities: Continue PRN  . Edema: Cryotherapy post therapy  . Gait: Normalize gait pattern.  o If no assistive device was used preoperatively and muscle performance is  sufficient, progress to ambulation without an assistive device by 4-6 weeks.  o If assistive device was used preoperatively or muscle performance is  insufficient, continue with appropriate assistive device.  . ROM: AROM, AAROM, PROM: Knee and hip within dislocation precautions. Hip  extensors may be stretched into extension at 6 weeks.  . Strengthening: Increase resistance of closed chain exercises.  o Forward and lateral step up/down  o 3-way SLR (exclude prone extension)  o 1/4 front lunge  o Sit to stand chair exercises  o Sidestepping and backwards ambulation  o Ambulation on uneven surfaces  o Balance/Proprioception: Progress to single leg balance challenges  o Lifting/carrying: Can lift up to 20 lbs. from floor.  o Pushing or pulling  o Return to work tasks  Environmental consultant if incision is completely healed  o Possible RTW with physician's restrictions  o Progress HEP or fitness center exercise routine    Phase IV (10+ weeks post-op.)  Progress exercise resistance, repetitions and duration for return to specific work tasks  and/or recreational sports.    Any questions?  [phone]  Call Dr. Lucretia Kern  [facility], [place]

## 2018-05-19 NOTE — H&P (Signed)
Reviewed history and physical.  No changes since previous examination.  Will proceed with operative intervention.  Patient seen and examined. All questions answered and consents signed.

## 2018-05-19 NOTE — Brief Op Note (Signed)
BRIEF OP NOTE    Date Time: 05/19/18 11:25 AM    Patient Name:   [name]    Date of Operation:   05/19/2018    Providers Performing:   Surgeon(s):  Ihsan Nomura, Darrel Reach, MD    Assistant (s):   Circulator: Dennie Maizes, RN  Scrub Person: Jone Baseman, CSA  First Assistant: Silva Bandy  Second Circulator: Bess Kinds, RN  Second Scrub Person: Joesphine Bare, CSA    Operative Procedure:   Procedure(s):  ARTHROPLASTY, HIP, TOTAL, ANTERIOR APPROACH, C ARM    Preoperative Diagnosis:   Pre-Op Diagnosis Codes:     * Osteoarthritis of right hip, unspecified osteoarthritis type [M16.11]    Postoperative Diagnosis:   Post-Op Diagnosis Codes:     * Osteoarthritis of right hip, unspecified osteoarthritis type [M16.11]    Anesthesia:   General    Estimated Blood Loss:    * No values recorded between 05/19/2018  9:20 AM and 05/19/2018 11:25 AM *    Implants:     Implant Name Type Inv. Item Serial No. Manufacturer Lot No. LRB No. Used Action   SHELL G7 OSSEOTI 4H G - [id number]  SHELL G7 OSSEOTI 4H G  VHBIOMET [id number] Right 1 Implanted   LINER G7 NEUTRAL E1 G - [id number]  LINER G7 NEUTRAL E1 G  VHBIOMET [id number] Right 1 Implanted   TPRLC 133 TYPE1 PPS SO 18.0 - [id number]  TPRLC 133 TYPE1 PPS SO 18.0  VHBIOMET [id number] Right 1 Implanted   CER BIOLOX OPTION HD - [id number]  CER BIOLOX OPTION HD  VHBIOMET [id number] Right 1 Implanted   SLEEVE CER OPTION TYPE1 TPR 0 - [id number]   SLEEVE CER OPTION TYPE1 TPR 0   VHBIOMET [id number] Right 1 Implanted       Drains:   Drains: no    Specimens:    none    OR Specimens:   * No specimens in log *    Findings:        Complications:   none      Signed by: Len Childs, MD                                                                           [facility]

## 2018-05-19 NOTE — PT Eval Note (Signed)
--------------------------------------------------------------------------------------  [facility]: [facility]  Physical Therapy EVALUATION  Patient: [name]     CSN: [id number]    Bed: 423/423-A  Visit#: 1   Treatment Frequency: twice a day  Last seen by a physical therapist vs. Physical therapist assistant: 05/19/2018    DISCHARGE RECOMMENDATIONS   Discharge Recommendations:   Home with supervision;Home with outpatient PT       *Discharge recommendations are subject to change based on patient's progress and/or home support changes - please refer to most recent PT note for current recommendation    DME recommended for Discharge:   Has needed equipment    PMP (Progressive Mobility Program) Recommendations:   Recommend patient perform antiembolic exercises 10 reps hourly and ambulate with wheeled walker with staff as patient tolerates. Perform lower extremity therapeutic exercises listed on handout 2-3x/day as tolerated.     Precautions and Contraindications:   Weight Bearing: Right LE Weight bearing as tolerated  THA Precautions: Avoid combination of hip hyperextension and external rotation  Falls  Mobility protocol     PT Assessment and Plan of Care (Treatment frequency noted above):     HPI (per physician charting) and Pertinent Medical Details:  Admitted 05/19/2018 with right hip osteoarthritis and pain. Patient now s/p right total hip arthroplasty performed by Dr. Emeline Darling (anterior approach, general anesthesia).     Goals:    STG=LTG: by discharge  1. Patient will perform bed mobility with independence in prep for out of bed activity. NEW   2. Patient will perform sit to stand transfers with modified independence with use of wheeled walker in prep for gait.  NEW    3. Patient will ambulate 300 feet with modified independence with wheeled walker in prep for home mobility.  NEW    4. Patient will ascend/descend 3 step(s) with 1 rail(s) with cane (if needed) with supervision in order to  demonstrate ability to enter home.  NEW    5. (THA Only) Patient will be able to verbalize and demonstrate adherence to hip precautions in order to promote overall safe mobility.  NEW   6. Patient and/or caregiver will demonstrate independence with LE HEP in order to improve ROM and strength in prep for transfers and gait.  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 At this time, pt performs supine to/from sit with min A, sit/stand with walker and min A, and side stepped with walker and supervision. Pt unable to perform right quadricep contraction at this time. Anticipate pt will discharge to friend's house with supervision and outpatient PT upon discharge.    PT Impairments:decreased ROM, decreased strength, decreased safety/judgement during functional mobility, decreased activity tolerance, decreased functional mobility, decreased balance, gait deficits, pain    Patient will benefit from skilled PT services in order to address impairment above to promote functional potential.       Treatment/interventions: Exercise, Gait training, Stair training, Neuromuscular re-education, Functional transfer training, LE strengthening/ROM, Patient/caregiver training, Equipment eval/education, Bed mobility    Due to the presence of several treatment options and 1-2 comorbidities or personal factors that affect performance, as well as patient's stable and/or uncomplicated characteristics, minimal to moderate modifications of mobility and/or assistance were necessary to complete evaluation when examining 1-2 elements (includes body structures and functions, activity limitations and/or participation restrictions) determines the degree of complexity for this patient is LOW    Rehabilitation Potential:Good with ongoing PT upon discharge from  acute care.     Discussed risk, benefits and Plan of Care with: Patient    *note: Clinical Presentation and Decision Making includes  the following sections: Goals, PT assessment, treatment frequency and treatment/interventions):    History Based on physician charted EPIC/EMR information:     Medical Diagnosis: Osteoarthritis of right hip, unspecified osteoarthritis type [M16.11]    Problem list:  Patient Active Problem List   Diagnosis   . Osteoarthritis of right hip        Past Medical/Surgical History:  Past Medical History:   Diagnosis Date   . Atrial flutter    . CAD (coronary artery disease)    . Carpal tunnel syndrome     H/O   . DM2 (diabetes mellitus, type 2)    . ED (erectile dysfunction)    . Encounter for blood transfusion    . Hypertension    . MI (myocardial infarction) '08, '11   . OA (osteoarthritis)    . Psoriasis       Past Surgical History:   Procedure Laterality Date   . BICEPS TENDON REPAIR Right    . CARDIOVERSION     . CORONARY ARTERY BYPASS GRAFT  2011   . CYST REMOVAL      RIGHT CHEEK   . INGUINAL HERNIA REPAIR Right     AS CHILD   . KNEE ARTHROTOMY Left 1975   . ORIF, RADIUS, DISTAL (WRIST) Right    . SINUS SURGERY         Social History    Information per Patient:    Home Living Arrangements:  Living Arrangements: Ex-wife and dtr  Assistance Available: Full time   Type of Home: House  Home Layout: Multi-level, with 3 stair(s) to enter, left rail(s), Able to live on main level with bedroom and bathroom    Prior Level of Function:  Community ambulation  Mobility:  Independent with  No assistive device  Fall history: Denies    DME available at home:  Front wheeled walker (Adult)  Single point cane    Subjective   "I can't really feel my thigh, it's pretty weak"    Patient/family/caregiver consent to therapy session is noted by the participation in the therapy session.    Patient/caregiver goal for PT: to regain strength, endurance, independence      Pain:  At Rest: 0/10  With Activity: 3/10  Location: Hip:  right  Interventions: Medication (see eMAR), Cold applied, Rest    Examination of Body Systems (Structures, Function,  Activity and Participation)   Patient's medical condition is appropriate for Physical therapy intervention at this time    Observation of patient:  Patient is in bed with Bed/chair alarm on, continuous pulse oximeter, peripheral IV, SCDs, TED hose    Cognition:  Oriented to: Oriented x4  Command following: Follows ALL commands and directions without difficulty  Alertness/Arousal: Appropriate responses to stimuli   Attention Span:Appears intact  Memory: Appears intact  Safety Awareness: minimal verbal instruction  Insights: Educated in safety awareness  Problem Solving: supervision    Vital Signs (Cardiovascular):  BP Supine:  124/72 mmHg  HR Supine: 86 bpm  SpO2 at rest: 96% on room air    Edema: not present   Skin Inspection: dressing R hip  Sensation: diminished, to light touch, right anterior lateral thigh     Balance:  Static Sitting:  Fair+  Static Standing:  Fair+  Dynamic Standing:  Fair+ with bil UE support on FWW  Musculoskeletal Examination:            Range of motion:  Right LE: WFL except: hip motion limited d/t surgery and pain   Left LE: Grossly WFL       Strength:  Right Knee Extension: no quadricep contraction present  Left LE: Grossly WFL    Tone:  Not applicable    Functional Mobility:    Bed Mobility:  Supine to Sit:   Minimal assist.   Cues for Sequencing., Cues for Hand placement., HOB elevated, Bed rail used, assist at trunk  Sit to Supine:   Minimal assist.   Cues for Sequencing., HOB flat, assist at LE(s)  Seated Scooting:   Supervision    Transfers:  Sit to Stand:  Minimal assist with Front wheeled walker.    Cues for Sequencing, Cues for Hand Placement, Cues for Foot Placement  Stand to Sit:  Minimal assist.    Cues for Sequencing, Cues for Hand Placement    Locomotion:  LEVEL AMBULATION:  Distance: x5 to the right   Assistance level:  Supervision  Device:  Front wheeled walker  Side-stepping   Pt limited d/t R quad weakness/numbness and palor                       Treatment  Interventions this session:   Evaluation  Therapeutic exercise: Performed AP, QS, GS for 10 reps each Bil LE  Therapeutic activity  Patient/family/caregiver education: see below    Education Provided:   TOPICS: role of physical therapy, plan of care, goals of therapy and HEP, safety with mobility and ADLs, benefits of activity, use of adaptive equipment, bed mobility with use of adaptive equipment or strategy, activity with nursing     Learner educated: Patient, Nursing Staff  Method: Explanation  Response to education: Verbalized understanding    Patient Position at End of Treatment:   Supine, in bed, in the room, Needs in reach, Bed/chair alarm set, No distress, SCD's/foot pumps applied and Ice applied    Team Communication:     Spoke to: RN/LPN Wilson Singer, CNA - Fredrick  Regarding: Pre-session re: patient status, Patient position at end of session, Patient participation with Therapy, Further recommendations  Whiteboard updated: Yes with current mobility status, recommendations for mobility with nursing staff, and HEP   PT/PTA communication: via written note and verbal communication as needed.    Time of treatment:  Time Calculation  PT Received On: 05/19/18  Start Time: 1434  Stop Time: 1459  Time Calculation (min): 25 min    Laural Roes, PT, DPT

## 2018-05-19 NOTE — Op Note (Signed)
Date of service:05/19/2018     PREOPERATIVE DIAGNOSIS: right hip osteoarthritis.    POSTOPERATIVE DIAGNOSIS: right hip osteoarthritis.    PROCEDURE: right anterior total hip arthroplasty.    SURGEON: Len Childs, MD.    ASSIST: Silva Bandy    ANESTHESIA: General ET tube anesthesia.    DRAINS: none    IMPLANTS: Biomet 58 cup with an apex hole eliminator and a zero  liner and a Biomet 18 Taperloc stem, 36 mm head with 0mm.    COMPLICATIONS: None.    EBL:    SPECIMENS: none    DISPOSITION: Stable to PACU.    DESCRIPTION OF PROCEDURE: The patient was correctly identified  in the preoperative area and the appropriate extremity was signed by the attending surgeon.  All risks and benefits were discussed  and all questions were answered.  A signed consent was placed in  the chart.  The patient was taken to the operating room.  The  patient underwent general ET tube anesthesia and a time-out was  completed with all members in the room.  IV antibiotics were  administered and all bony prominences were padded.  Venodynes  were placed on both lower extremities.  The right lower  extremity was prepped and draped in the usual sterile fashion  after the patient was placed on the Hana table.      First ASIS was palpated and an anterior incision was made just lateral and  distal to the ASIS.  This was carried down to the tensor fascia  lata.  This was identified by looking at the perforators and the quality of the muscle.  The fascia was  incised sharply.  The muscle was teased off and blunt dissection  was carried down to the femoral neck.  A blunt Cobra retractor  was placed superiorly and two hibs retractors were used  inferiorly.  The lateral femoral circumflex arteries were  identified and ligated using both Aquamantys and electrocautery.  Electrocautery was used to split the deep fascia and the lateral  circumflex vessels.      Next, a Cobb elevator was used to identify  the medial aspect of the femoral  neck and a blunt Cobra retractor was  placed medially.  A #9 retractor was then placed at the superior  Acetabular rim after releasing some of the rectus.  The  capsule was identified and Aquamantys was used to coagulate the  capsule.  Then a capsulectomy was performed and liagmentous release was performed on the femoral neck.  Next, a femoral  neck osteotomy was performed using a napkin ring technique.  The  femoral head was extracted with a Schanz pin.  The leg was fully  externally rotated and the inferior-most ligaments were released  just proximal to the lesser trochanter.  Then, the acetabulum  was addressed.      #9 retractor was placed superiorly and #7 at the  transverse acetabular ligament, and a Cobra at the posterior  aspect of the acetabulum.  The redundant capsule posteriorly was  split using the Bovie electrocautery.  Then, a long handled  knife was used to resect any remaining labrum.  Bovie and  Aquamantys was used to identify the fovea and the fovea was  cleared of any tissue.  Then, an opening reamer of #43 was used  to medialize the acetabulum.  Sequential reaming was performed  Under fluoroscopic guidance up to a 57.  A 57 trial was placed, which did have good fit and  did have good placement.  There was extensive osteophyte formation, most notably at the inferior acetabulum.  A 3/4 inch curved osteotome, curette, and rongeur were used to removal a significant amount of osteophyte to prevent impingement and instability. A 58was  selected.  The acetabulum was copiously irrigated.  A 58 was  placed under fluoroscopic guidance with good fit.  No screws  were used.  An apex hole eliminator was placed and a 0 polyethylene liner trial.    Next, the femur was addressed.  Femoral hook was placed in the  potential space lateral to the femur.  Then, a Mueller retractor  was placed on the medial neck.  A Hohmann retractor behind the  greater trochanter.  Then, the remaining superior capsule was  resected  and then Hohmann retractor was placed intracapsularly.  After this, soft tissue releases were performed until the femur  was mobile.  Then, the leg was fully externally rotated, dropped to  the floor and adducted as much as possible.  The cookie cutter  was used to open the canal and a canal finder, both the silver  and gold were used to find the canal.  A chili pepper broach was  used to lateralize the femoral component as much as possible.  Then, sequential broaching was performed starting at a #4 and  going up to a #18. This did have a good fit with good stability and good fit under flouroscopy.  A std offset and 36 mm head were selected and the hip was  reduced.  0 mm was selected and was stable to external rotation and extension.  The acetabular liner was exchanged for the final implant.  The broach was removed and the copious irrigation of the femur was performed.  The femoral implant was  placed and did have good fit and solid fill.  Again, a 0 mm  trial was used and after being satisfied with the reduction as  well as the stability, the trial was removed and the final head  was impacted.  This reduced without any issues and the hip was  stable.  Final x-rays were obtained.  Final manual stability check revealed a stable hip in all planes of motion.    The wounds were copiously  irrigated and injection at the periarticular muscles was  performed.  Then, the fascial layer was closed with a #2 Quill.  Subcutaneous layer closed with a 2-0 Quill and the skin closed  with a 2-0 Monocryl.  Dermabond and Steri-Strips were applied  and a sterile dressing was applied.  The patient was then placed  into an abduction pillow and woke up without any issues.  He was  taken to the PACU in stable condition.    Due to this patient's BMI of 36 classifying as obese, the severity of the disease, and the complexity of the case, positioning took 30 minutes, the case took 1.5 hours and extubation took 30 minutes.  Typically,  positioning should take 15 minute, the case should take 60-90 minutes and extubation should take 10.

## 2018-05-19 NOTE — Progress Notes (Addendum)
NURSE NOTE SUMMARY  [facility] - ORTHOPAEDICS   Patient Name: [name]   Attending Physician: Lesia Hausen Ile*   Today's date:   05/19/2018 LOS: 0 days   Shift Summary:                                                              12:42 PM Pt arrived from PACU at 1240  Pt reports pain level of 5/10     Orders released and reviewed, pt education and unit orientation complete.   Assessment is complete and will monitor pt. with rounds.     Provider Notifications:   1:19 PM Cortext Dr. Lorenza Chick d/t pt being diabetic but no ACHS or sliding scale on MAR.  Awaiting response at this time.   Rapid Response Notifications:  Mobility:          Weight tracking:  Family Dynamic:   Last 3 Weights for the past 72 hrs (Last 3 readings):   Weight   05/19/18 0824 136.1 kg (300 lb 0.7 oz)               Recent Vitals Last Bowel Movement   BP 121/74   Pulse 76   Temp 97.9 F (36.6 C) (Tympanic)   Resp (!) 32   Ht 1.93 m (6\' 4" )   Wt 136.1 kg (300 lb 0.7 oz)   SpO2 95%   BMI 36.52 kg/m  No Data Recorded

## 2018-05-19 NOTE — Treatment Plan (Signed)
Total Hip Arthroplasty/Direct Anterior  Surgical Approach    Dislocation Precautions: No strenuous external rotation and extension. Ok to flex and internally rotate hip. Ok to bend over.  Weight lifting restrictions: 20 lbs.    Phase I (1 - 5 days post-op.)  . Wound care: Observe for signs of infection.  . Observe for signs of DVT (Homan's) or dislocation.  . Modalities PRN for pain or inflammation (ice, IFC)  . Edema: Cryotherapy following P.T. Elevation. Compression stockings (TED hose)  must be worn until patient exhibits independent, normal gait.   . Gait: Ambulation with walker or 2 crutches on flat surfaces only with weight bearing  as tolerated unless specified by M.D.  . ROM: AROM/AAROM/PROM: Knee and hip within dislocation precautions.  . Strengthening: Isometric quadriceps, hamstrings, and gluteal exercises. Closed chain  exercises with bilateral upper extremity support, while observing weight bearing restrictions,  if any.    Phase II (5 days - 4 weeks post-op.)  . Continue to observe for signs of infection. Begin scar management techniques when  incision is closed.  . Modalities: Continue PRN  . Edema: Cryotherapy following P.T.. Elevation. Compression stockings (TED hose)  must be worn until patient exhibits independent, normal gait.   . Gait: Based on post-op. WB status  o If WBAT to FWB, may wean to one cane at 1 week post-op.  o If PWB and no assistive device were used preoperatively, wean off assistive device by 2-  6 weeks, if muscle performance is sufficient.  . Balance/Proprioception training: Weight-shifting activities.  . ROM: AROM, AAROM, PROM: Knee and hip within dislocation precautions.  . Strengthening: Continue isometric quadriceps, hamstrings and gluteal exercises. Stationary  cycle or stepper, closed chain exercises and progressive resistance exercises,  weeks 3-4.    Phase III (4 weeks - 10 weeks post-op.)  . Wound: Continue scar mobilizations.  . Modalities: Continue PRN  . Edema:  Cryotherapy post therapy  . Gait: Normalize gait pattern.  o If no assistive device was used preoperatively and muscle performance is  sufficient, progress to ambulation without an assistive device by 4-6 weeks.  o If assistive device was used preoperatively or muscle performance is  insufficient, continue with appropriate assistive device.  . ROM: AROM, AAROM, PROM: Knee and hip within dislocation precautions. Hip  extensors may be stretched into extension at 6 weeks.  . Strengthening: Increase resistance of closed chain exercises.  o Forward and lateral step up/down  o 3-way SLR (exclude prone extension)  o 1/4 front lunge  o Sit to stand chair exercises  o Sidestepping and backwards ambulation  o Ambulation on uneven surfaces  o Balance/Proprioception: Progress to single leg balance challenges  o Lifting/carrying: Can lift up to 20 lbs. from floor.  o Pushing or pulling  o Return to work tasks  o Aquatic program if incision is completely healed  o Possible RTW with physician's restrictions  o Progress HEP or fitness center exercise routine    Phase IV (10+ weeks post-op.)  Progress exercise resistance, repetitions and duration for return to specific work tasks  and/or recreational sports.    Any questions?  Call Dr. Courtland Coppa  [facility]  [phone]

## 2018-05-19 NOTE — Transfer of Care (Signed)
Anesthesia Transfer of Care Note      Patient Name:     [name]    Procedures Performed:     Procedure(s) with comments:  ARTHROPLASTY, HIP, TOTAL, ANTERIOR APPROACH, C ARM - RIGHT TOTAL HIP REPLACEMENT, ASI     Anesthesia Type:     General    Patient Location:     PACU    Last Vitals:     Vitals:    05/19/18 1149   BP: 122/72   Pulse: 77   Resp: 16   Temp: 37 C (98.6 F)   SpO2: 94%       Post Pain:     Patient not complaining of pain, continue current therapy    Mental Status:     Awake    Respiratory Function:     Adequate    Cardiovascular:       Stable    Nausea/Vomiting:      Patient not complaining of nausea     Hydration Status:     Adequate    Post Assessment:      No apparent anesthetic complications, no reportable events     [place] H Jase Himmelberger, DO  05/19/2018 11:49 AM

## 2018-05-19 NOTE — Consults (Signed)
[facility]  MEDICINE CONSULT NOTE      Patient: [name]  Date: 05/19/2018   DOB: 1956/06/01  Admission Date: 05/19/2018   MRN: [id number]  Attending: Lesia Hausen Ile*       Reason for Consultation: Medical management of hypertension and diabetes  Requesting Physician: Lesia Hausen Ile*  Consulting Physician: Brantley Persons, MD    History Gathered From: Patient     PRIMARY CARE MD: Md, Out Of Network    HISTORY AND PHYSICAL     [name] is a 62 y.o. male with the following    Patient is 62 year old male with past medical history of hypertension, diabetes, coronary artery disease, atrial flutter on Eliquis, psoriasis, osteoarthritis, admitted for elective right total hip arthroplasty by Dr. Emeline Darling. Patient underwent surgery this morning, presently recovering well, denies any pain, nausea or vomiting. Hospitalist consult requested for management of hypertension and diabetes. Patient's hypertension and diabetes is well controlled at baseline with recent hemoglobin A1c 5.2.    Past Medical History:   Diagnosis Date   . Atrial flutter    . CAD (coronary artery disease)    . Carpal tunnel syndrome     H/O   . DM2 (diabetes mellitus, type 2)    . ED (erectile dysfunction)    . Encounter for blood transfusion    . Hypertension    . MI (myocardial infarction) '08, '11   . OA (osteoarthritis)    . Psoriasis        Past Surgical History:   Procedure Laterality Date   . BICEPS TENDON REPAIR Right    . CARDIOVERSION     . CORONARY ARTERY BYPASS GRAFT  2011   . CYST REMOVAL      RIGHT CHEEK   . INGUINAL HERNIA REPAIR Right     AS CHILD   . KNEE ARTHROTOMY Left 1975   . ORIF, RADIUS, DISTAL (WRIST) Right    . SINUS SURGERY          who was admitted to .    Prior to Admission medications    Medication Sig Start Date End Date Taking? Authorizing Provider   amLODIPine (NORVASC) 5 MG tablet Take 10 mg by mouth daily     04/11/18  Yes [provider]   apixaban (ELIQUIS) 5 MG Take 5 mg  by mouth every 12 (twelve) hours     08/13/17  Yes [provider]   carvedilol (COREG) 6.25 MG tablet TAKE 1 TABLET BY MOUTH TWICE A DAY WITH A MEAL 08/13/17  Yes [provider]   empagliflozin (JARDIANCE) 10 MG tablet TAKE 10 MG BY MOUTH DAILY. 04/15/18  Yes [provider]   furosemide (LASIX) 20 MG tablet Take 40 mg by mouth as needed   Yes [provider]   glipiZIDE (GLUCOTROL) 10 MG tablet TAKE 1 TABLET BY MOUTH TWICE A DAY BEFORE A MEAL 12/16/17  Yes [provider]   losartan (COZAAR) 50 MG tablet Take 50 mg by mouth daily     08/13/17  Yes [provider]   potassium chloride (MICRO-K) 10 MEQ CR capsule Take 10 mEq by mouth daily     08/13/17  Yes [provider]   SITagliptin-metFORMIN (JANUMET) 50-500 MG tablet Take 1 tablet by mouth 2 (two) times daily with meals     06/24/17  Yes [provider]   TiZANidine (ZANAFLEX) 4 MG capsule Take 4 mg by mouth 2 (two) times  daily     07/16/17  Yes [provider]   acetaminophen (TYLENOL) 325 MG tablet Take 2 tablets (650 mg total) by mouth every 4 (four) hours 05/19/18   Gore, Darrel Reach, MD   oxyCODONE (ROXICODONE) 5 MG immediate release tablet Take 1 tablet (5 mg total) by mouth every 3 (three) hours as needed for Pain 05/19/18 05/26/18  Gore, Darrel Reach, MD   traMADol (ULTRAM) 50 MG tablet Take 1 tablet (50 mg total) by mouth every 6 (six) hours for 7 days 05/19/18 05/26/18  Len Childs, MD       No Known Allergies    History reviewed. No pertinent family history.    Social History   Substance Use Topics   . Smoking status: Never Smoker   . Smokeless tobacco: Never Used   . Alcohol use Yes      Comment: occasional        REVIEW OF SYSTEMS     Except pertinent positives and negatives as mentioned in HPI and past medical history all other review of systems including constitutional, eyes, ENT,cardiovascular, respiratory, gastrointestinal, genitourinary,  musculoskeletal, neurological, allergic, immunological, hematological  integumentary and lymphatic are unremarkable.     PHYSICAL EXAM     Vital Signs (most recent): BP 136/76   Pulse 73   Temp 98.4 F (36.9 C) (Oral)   Resp 18   Ht 1.93 m (6\' 4" )   Wt 136.1 kg (300 lb 0.7 oz)   SpO2 98%   BMI 36.52 kg/m     1) General appearance:  well developed,well nourished, in no apparent acute cardiorespiratory distress.     2) HEENT: Head is atraumatic and normocephalic. Pupils are equally reactive to light and accommodation. Extraocular muscles are intact. Patient has intact external auditory canal. No abnormal lesions or bleeding from nose. Oral mucosa moist with no pharyngeal congestion.     3) Neck: Supple. Trachea is central, no JVD or carotid bruit.     4) Chest: Clear to auscultation bilaterally, no wheezing. No use of accessory muscles     6) CVS: The S1, S2 normal. Regular rate and rhythm.No murmur, no thrill.    7) Abdomen:  soft, non tender, non distended, no palpable mass, no hepato-splenomegaly appreciated. Bowel sounds audible.     8) Musculoskeletal: Patient has 5/5 motor strength in bilateral upper extremities as well as bilateral LE. No pitting edema legs. No clubbing or cyanosis in bilateral lower extremities. No gross limitation of ROM of all 4 extremities. Right hip surgical dressing.    9) Neurological: Cranial nerves II-XII intact. Deep tendon reflexes 2+    10) Psychiatric: Alert and oriented x 3. Mood is appropriate.     11) Integumentary: warm with normal skin turgor, no rash, no indurated areas    12) Lymphatics: No lymphadenopathy in axillary, cervial and inguinal area.       LABS & IMAGING           Invalid input(s): ADIFF, REFLX, CANCL, BAND, ABAND                            Imaging Studies:   Xr Pelvis Limited 1 Or 2 Views    Result Date: 05/19/2018  No acute abnormality. ReadingStation:[id number]      Microbiology and/or telemetry results:   No cultures obtained  EKG done 02/18/18 and  reviewed by me showed Atrial flutter with predominant 4:1 AV block  Borderline left axis deviation        ASSESSMENT & PLAN     [name] is a 62 y.o. male with     #Hypertension, well controlled.  Norvasc dose reduced to 5 mg, Coreg reduced to 3.125 mg for now.  Continue losartan 50 mg daily with holding parameters    #History of diabetes mellitus  Glipizide, dose reduced to 5 mg twice a day  Continue home dose Janumet and Jardiance.  Medium dose sliding scale insulin, hemoglobin A1c in a.m.    #History of atrial flutter, rate controlled.  Continue Coreg and Eliquis.    #History of chronic lower extremity swelling.  Continue home dose Lasix.    #History of osteoarthritis status post right total hip arthroplasty.  Continue current postop management per orthopedic.  Pain control with when necessary Dilaudid and Roxicodone.      CODE Status: Full code    DVT prophylaxis: Patient on therapeutic dose anticoagulation      I appreciate the opportunity to be involved in [name]'s care.     Almetta Lovely  05/19/2018 2:55 PM    CC: Lesia Hausen Ile* and Md, Out Of Network      Note: This chart was generated by the Epic EMR system/speech recognition and may contain inherent errors or omissions not intended by the user. Grammatical errors, random word insertions, deletions, pronoun errors and incomplete sentences are occasional consequences of this technology due to software limitations. Not all errors are caught or corrected. If there are questions or concerns about the content of this note or information contained within the body of this dictation they should be addressed directly with the author for clarification

## 2018-05-20 ENCOUNTER — Encounter: Payer: Self-pay | Admitting: Orthopaedic Surgery

## 2018-05-20 LAB — CBC
Hematocrit: 31.5 % — ABNORMAL LOW (ref 39.0–52.5)
Hemoglobin: 10.9 gm/dL — ABNORMAL LOW (ref 13.0–17.5)
MCH: 37 pg — ABNORMAL HIGH (ref 28–35)
MCHC: 35 gm/dL (ref 32–36)
MCV: 106 fL — ABNORMAL HIGH (ref 80–100)
MPV: 8.6 fL (ref 6.0–10.0)
PLT CT: 200 10*3/uL (ref 130–440)
RBC: 2.98 10*6/uL — ABNORMAL LOW (ref 4.00–5.70)
RDW: 13 % (ref 11.0–14.0)
WBC: 10.4 10*3/uL (ref 4.0–11.0)

## 2018-05-20 LAB — BASIC METABOLIC PANEL
Anion Gap: 11.5 mMol/L (ref 7.0–18.0)
BUN / Creatinine Ratio: 27 Ratio (ref 10.0–30.0)
BUN: 27 mg/dL — ABNORMAL HIGH (ref 7–22)
CO2: 23.3 mMol/L (ref 20.0–30.0)
Calcium: 8.4 mg/dL — ABNORMAL LOW (ref 8.5–10.5)
Chloride: 106 mMol/L (ref 98–110)
Creatinine: 1 mg/dL (ref 0.80–1.30)
EGFR: 81 mL/min/{1.73_m2} (ref 60–150)
Glucose: 166 mg/dL — ABNORMAL HIGH (ref 71–99)
Osmolality Calc: 281 mOsm/kg (ref 275–300)
Potassium: 4.8 mMol/L (ref 3.5–5.3)
Sodium: 136 mMol/L (ref 136–147)

## 2018-05-20 LAB — VH DEXTROSE STICK GLUCOSE
Glucose POCT: 167 mg/dL — ABNORMAL HIGH (ref 71–99)
Glucose POCT: 168 mg/dL — ABNORMAL HIGH (ref 71–99)
Glucose POCT: 165 mg/dL — ABNORMAL HIGH (ref 71–99)
Glucose POCT: 161 mg/dL — ABNORMAL HIGH (ref 71–99)
Glucose POCT: 158 mg/dL — ABNORMAL HIGH (ref 71–99)

## 2018-05-20 NOTE — Progress Notes (Signed)
[facility] PROGRESS NOTE    Date Time: 05/20/18 7:42 AM  Patient Name: [name],[name]      Assessment:   POD 1 s/p R ant THA    Plan:    Weight bearing status: wbat   Precautions: ant hip   PT/OT: oob, weak quads yesterday due to local injection   Anticoagulation: apixaban    Follow up: H/H stable   prob [place] home today      Subjective:   Pain controlled, worked with PT    Medications:     Current Facility-Administered Medications   Medication Dose Route Frequency   . acetaminophen  650 mg Oral Q4H SCH   . amLODIPine  5 mg Oral Daily   . apixaban  5 mg Oral Q12H   . [START ON 05/21/2018] bisacodyl  5 mg Oral Daily   . carvedilol  3.125 mg Oral Q12H SCH   . furosemide  40 mg Oral Daily   . glipiZIDE  5 mg Oral BID AC   . insulin lispro  2-16 Units Subcutaneous TID AC    And   . insulin lispro  1-9 Units Subcutaneous QHS   . losartan  50 mg Oral Daily   . metFORMIN  500 mg Oral BID Meals    And   . SITagliptin  50 mg Oral BID Meals   . NON-FORMULARY PAT OWN MED order form  10 mg Oral Daily   . polyethylene glycol  17 g Oral BID   . tiZANidine  4 mg Oral BID   . traMADol  50 mg Oral 4 times per day   . tranexamic acid  3,000 mg Intra-articular Once         Physical Exam:     Vitals:    05/20/18 0711   BP: 120/67   Pulse: 80   Resp: 16   Temp: 98.4 F (36.9 C)   SpO2: 93%       Intake and Output Summary (Last 24 hours) at Date Time    Intake/Output Summary (Last 24 hours) at 05/20/18 0742  Last data filed at 05/20/18 0336   Gross per 24 hour   Intake             1000 ml   Output             2410 ml   Net            -1410 ml         Neurological -  motor and sensory grossly normal  Musculoskeletal - SILT s/s/ta/dp/sp, toes wawp, +df/pf toes and ankle    Incision- dressing clean dry and intact    Labs:     Results     Procedure Component Value Units Date/Time    Dextrose Stick Glucose [[id number]]  (Abnormal) Collected:  05/20/18 0712    Specimen:  Blood Updated:  05/20/18 0728      Glucose, POCT 168 (H) mg/dL     Basic metabolic panel [[id number]]  (Abnormal) Collected:  05/20/18 0450    Specimen:  Plasma Updated:  05/20/18 0530     Sodium 136 mMol/L      Potassium 4.8 mMol/L      Chloride 106 mMol/L      CO2 23.3 mMol/L      Calcium 8.4 (L) mg/dL      Glucose 841 (H) mg/dL      Creatinine 3.24 mg/dL      BUN 27 (H) mg/dL  Anion Gap 11.5 mMol/L      BUN/Creatinine Ratio 27.0 Ratio      EGFR 81 mL/min/1.20m2      Osmolality Calc 281 mOsm/kg     CBC (qam x3) [[id number]]  (Abnormal) Collected:  05/20/18 0450    Specimen:  Blood from Blood Updated:  05/20/18 0512     WBC 10.4 K/cmm      RBC 2.98 (L) M/cmm      Hemoglobin 10.9 (L) gm/dL      Hematocrit 40.9 (L) %      MCV 106 (H) fL      MCH 37 (H) pg      MCHC 35 gm/dL      RDW 81.1 %      PLT CT 200 K/cmm      MPV 8.6 fL     Dextrose Stick Glucose [[id number]]  (Abnormal) Collected:  05/20/18 0259    Specimen:  Blood Updated:  05/20/18 0316     Glucose, POCT 167 (H) mg/dL     Dextrose Stick Glucose [[id number]]  (Abnormal) Collected:  05/19/18 2313    Specimen:  Blood Updated:  05/19/18 2330     Glucose, POCT 209 (H) mg/dL     Dextrose Stick Glucose [[id number]]  (Abnormal) Collected:  05/19/18 2105    Specimen:  Blood Updated:  05/19/18 2121     Glucose, POCT 236 (H) mg/dL     Dextrose Stick Glucose [[id number]]  (Abnormal) Collected:  05/19/18 1707    Specimen:  Blood Updated:  05/19/18 1724     Glucose, POCT 261 (H) mg/dL     Hemoglobin U7O [[id number]] Collected:  05/19/18 1511    Specimen:  Blood Updated:  05/19/18 1707     Hgb A1C, % 5.5 %     Dextrose Stick Glucose [[id number]]  (Abnormal) Collected:  05/19/18 1206    Specimen:  Blood Updated:  05/19/18 1222     Glucose, POCT 197 (H) mg/dL     Type and Screen [[id number]] Collected:  05/19/18 0843    Specimen:  Blood Updated:  05/19/18 1026     ABO Rh A Positive     AB Screen NEGATIVE          Rads:   Xr Pelvis Limited 1 Or 2 Views    Result Date: 05/19/2018  No acute abnormality.  ReadingStation:[id number]      Signed by: Len Childs, MD

## 2018-05-20 NOTE — Plan of Care (Signed)
Problem: Hip Surgery  Goal: Free from Infection  Outcome: Progressing  Pt afebrile, VSS, no s/s of infection. Dressing to right hip dry and intact. Instructed pt to use incentive spirometer.  Goal: Nutritional Intake is Adequate  Outcome: Progressing  Pt tolerating regular diet, no nausea or vomiting. Stool softeners given.  Goal: Neurovascular Status is Stable  Outcome: Progressing  CSM intact, pt is on Eliquis for anticoagulation. Wearing SCDs. Pt still having some quad weakness and numbness to the right leg after surgery. MD aware.  Goal: Hemodynamic Stability  Outcome: Progressing  VSS, Labs wnl, pt voiding without diff.  Goal: Mobility/activity is maintained at optimum level for patient  Outcome: Progressing  Pt worked with therapy today, see PT/OT notes. Readiness for discharge home today will depend on if pt's right quad weakness improves.  Goal: Pain at adequate level as identified by patient  Outcome: Progressing  Pain is well controlled with Tramadol. Pt aware that he may request Oxycodone prn.

## 2018-05-20 NOTE — UM Notes (Signed)
[facility] Utilization Management Review Sheet    Facility :  [facility]    NAME: Almalik Weissberg  MR#: [id number]    CSN#: [id number]    ROOM: 423/423-A AGE: 62 y.o.    ADMIT DATE AND TIME: 05/19/2018  7:31 AM      PATIENT CLASS: Observation     ATTENDING PHYSICIAN: Lesia Hausen Ile*  PAYOR:Payor: OUT OF STATE BLUE CROSS / Plan: BCBS OUT OF STATE PPO / Product Type: BCBS /       AUTH #: [id number]    DIAGNOSIS: Osteoarthritis of the right hip    HISTORY:   Past Medical History:   Diagnosis Date   . Atrial flutter    . CAD (coronary artery disease)    . Carpal tunnel syndrome     H/O   . DM2 (diabetes mellitus, type 2)    . ED (erectile dysfunction)    . Encounter for blood transfusion    . Hypertension    . MI (myocardial infarction) '08, '11   . OA (osteoarthritis)    . Psoriasis        DATE OF REVIEW: 05/20/2018    VITALS: BP 120/67   Pulse 80   Temp 98.4 F (36.9 C) (Oral)   Resp 16   Ht 1.93 m (6\' 4" )   Wt 136.1 kg (300 lb 0.7 oz)   SpO2 93%   BMI 36.52 kg/m     Active Hospital Problems    Diagnosis   . Osteoarthritis of right hip     Admitted for elective surgical procedure of right total hip arthroplasty (anterior) under general anesthesia    EBL 600 ml    Labs: H&H 10.9/31.5, calcium 8.4, glucose 166, BUN 27    Meds: tylenol q4h, norvasc qd, eliquis q12h, coreg q12h, ancef ivpb q8h, lasix qd, insulin pens, cozaar qd, miralax bid, zanaflex bid, ultram qid, IV fluids, fentanyl x 1 in pacu, insulin per ss,     Plans: IV fluids, IV antibiotics, pain management, hip/ROM precautions, neurovascular checks, dvt prophylaxis, RT consult, abduction pillow, cold therapy to operative site, continuous pulse oximetry, OT and PT evaluations     Alvin Critchley RN  [facility]  Utilization Review  P [phone]  F [phone]

## 2018-05-20 NOTE — Progress Notes (Signed)
Medicine Progress Note - [facility]  [employer]   Patient Name: [name], [name] LOS: 0 days   Attending Physician: Lesia Hausen Ile* PCP: Md, Out Of Network      Assessment and Plan:                                                              #Hypertension, well controlled.  -- continue on Norvasc and Coreg , dose reduced from his home   --Continue losartan 50 mg daily with holding parameters    #Diabetes mellitus  --Reasonable glycemic control  -- continue on Glipizide,at reduced dose , discharged home with same reduced dose  --Continue metformin and Januvia  --Medium dose sliding scale insulin,   --hemoglobin A1c 5.5; tight glycemic control in the last few months, with increased risk of hypoglycemic event      #Atrial flutter, rate controlled.  --Continue Coreg and Eliquis.    #Chronic lower extremity swelling.  --Continue home dose Lasix.    #History of osteoarthritis status post right total hip arthroplasty.  --Continue current postop management per orthopedic.  --Pain control with when necessary Dilaudid and Roxicodone.    Disposition: We will continue to follow along  DVT PPX: On therapeutic anticoagulation with Eliquis  Code:  Full Code   Hospital Course   [name] is a 62 y.o. male patient with past medical history of hypertension, diabetes, coronary artery disease, atrial flutter on Eliquis, psoriasis, osteoarthritis, admitted for elective right total hip arthroplasty by Dr. Emeline Darling. Patient underwent surgery .  Hospitalist consult requested for management of hypertension and diabetes. Patient's hypertension and diabetes is well controlled at baseline with recent hemoglobin A1c 5.2.        EMR Generated Hospital Problem List   Active Problems:    Osteoarthritis of right hip     Subjective   His  pain at the left hip site is controlled.  He denies chest pain or shortness of breath.  Denies any nausea or vomiting denies any fever or chills        Objective    Physical Exam:     Vitals: T:98.4 F (36.9 C) (Oral), BP:122/66, HR:78, RR:16, SaO2:96%    General: Patient is awake. In no acute distress.  HEENT: PERRL, EOMI, no conjunctival drainage, vision is intact.  Neck: supple, no thyromegaly.  Chest: CTA bilaterally. No rhonchi, no wheezing. No use of accessory muscles.  CVS: normal rate and regular rhythm no murmurs, without JVD.  Abdomen: soft, non-tender, no guarding or rigidity, with normal bowel sounds.  Extremities: Right hip surgical wound site dressing  noted,  pitting edema, pulses palpable, no calf swelling and gross no deformity.  Skin: Warm, dry, no rash and no worrisome lesions.  NEURO: no motor or sensory deficits.  Psychiatric: alert, interactive, appropriate, normal affect.  Weight Monitoring 02/18/2018 05/06/2018 05/19/2018   Height 193 cm 193 cm 193 cm   Height Method Stated Stated Stated   Weight 141.522 kg 140.615 kg 136.1 kg   Weight Method Actual Stated -   BMI (calculated) 38.1 kg/m2 37.8 kg/m2 36.6 kg/m2         Intake/Output Summary (Last 24 hours) at 05/20/18 1355  Last data filed at 05/20/18 1254   Gross per 24 hour  Intake             2049 ml   Output             3450 ml   Net            -1401 ml     Body mass index is 36.52 kg/m.   Meds:   Current Facility-Administered Medications   Medication Dose Route Frequency   . acetaminophen  650 mg Oral Q4H SCH   . amLODIPine  5 mg Oral Daily   . apixaban  5 mg Oral Q12H   . [START ON 05/21/2018] bisacodyl  5 mg Oral Daily   . carvedilol  3.125 mg Oral Q12H SCH   . furosemide  40 mg Oral Daily   . glipiZIDE  5 mg Oral BID AC   . insulin lispro  2-16 Units Subcutaneous TID AC    And   . insulin lispro  1-9 Units Subcutaneous QHS   . losartan  50 mg Oral Daily   . metFORMIN  500 mg Oral BID Meals    And   . SITagliptin  50 mg Oral BID Meals   . NON-FORMULARY PAT OWN MED order form  10 mg Oral Daily   . polyethylene glycol  17 g Oral BID   . tiZANidine  4 mg Oral BID   . traMADol  50 mg Oral 4 times per  day   . tranexamic acid  3,000 mg Intra-articular Once     . lactated ringers 100 mL/hr at 05/20/18 0301     PRN Meds: acetaminophen, dextrose, diphenhydrAMINE, diphenhydrAMINE, glucagon (rDNA), HYDROmorphone, NSG Communication: Glucose POCT order (AC, HS) **AND** insulin lispro **AND** insulin lispro **AND** insulin lispro, naloxone, ondansetron **OR** ondansetron, oxyCODONE, oxyCODONE, promethazine **OR** promethazine **OR** promethazine.   LABS:   Estimated Creatinine Clearance: 116.9 mL/min (based on SCr of 1 mg/dL).    Recent Labs  Lab 05/20/18  0450   WBC 10.4   RBC 2.98*   Hemoglobin 10.9*   Hematocrit 31.5*   MCV 106*   PLT CT 200             Lab Results   Component Value Date    HGBA1CPERCNT 5.5 05/19/2018       Recent Labs  Lab 05/20/18  0450   Glucose 166*   Sodium 136   Potassium 4.8   Chloride 106   CO2 23.3   BUN 27*   Creatinine 1.00   EGFR 81   Calcium 8.4*               Invalid input(s):  AMORPHOUSUA   Patient Lines/Drains/Airways Status    Active PICC Line / CVC Line / PIV Line / Drain / Airway / Intraosseous Line / Epidural Line / ART Line / Line / Wound / Pressure Ulcer / NG/OG Tube     Name:   Placement date:   Placement time:   Site:   Days:    Peripheral IV 05/19/18 Left Hand  05/19/18    0848    Hand    1    NG/OG Tube Orogastric 18 Fr. Center mouth  05/19/18    0936    Center mouth    1    Incision Site 05/19/18 Hip Right  05/19/18    1010      1               Xr Pelvis Limited 1 Or 2 Views  Result Date: 05/19/2018  No acute abnormality. ReadingStation:[id number]     Time spent:    Nutrition assessment done in collaboration with Registered Dietitians:        Obie Dredge, MD     05/20/18,1:55 PM   MRN: [id number]                                      CSN: [id number] DOB: 1956/05/03

## 2018-05-20 NOTE — PT Progress Note (Signed)
Physical Therapy PROGRESS note                             VHS: [facility]  Patient: [name]     CSN: [id number]    Bed: 423/423-A    Visit#: 3   Treatment Frequency: twice a day  Last seen by a physical therapist vs. Physical therapist assistant: 05/19/2018     DISCHARGE RECOMMENDATIONS   Discharge Recommendations:   Home with supervision;Home with outpatient PT   *Discharge recommendations are subject to change based on patient's progress and/or home support changes - please refer to most recent PT note for current recommendation     DME recommended for Discharge:   Has needed equipment     PMP (Progressive Mobility Program) Recommendations:   Recommend patient perform antiembolic exercises 10 reps hourly and ambulate with wheeled walker with staff as patient tolerates. Perform lower extremity therapeutic exercises listed on handout 2-3x/day as tolerated.      Precautions and Contraindications:   Weight Bearing: Right LE Weight bearing as tolerated  THA Precautions: Avoid combination of hip hyperextension and external rotation  Falls  Mobility protocol      PT Assessment and Plan of Care (Treatment frequency noted above):      HPI (per physician charting) and Pertinent Medical Details:  Admitted 05/19/2018 with right hip osteoarthritis and pain. Patient now s/p right total hip arthroplasty performed by Dr. Emeline Darling (anterior approach, general anesthesia).      Goals:    STG=LTG: by discharge  1. Patient will perform bed mobility with independence in prep for out of bed activity. Ongoing  2. Patient will perform sit to stand transfers with modified independence with use of wheeled walker in prep for gait. Ongoing    3. Patient will ambulate 300 feet with modified independence with wheeled walker in prep for home mobility. Ongoing    4. Patient will ascend/descend 3 step(s) with 1 rail(s) with cane (if needed) with supervision in order to demonstrate ability to enter home. MET    5. (THA Only)  Patient will be able to verbalize and demonstrate adherence to hip precautions in order to promote overall safe mobility. MET  6. Patient and/or caregiver will demonstrate independence with LE HEP in order to improve ROM and strength in prep for transfers and gait. Ongoing     PT Assessment:  Patient's progress towards established goals: Patient able to negotiate steps required to enter his home with supervision. He increased ambulation distance to 250 feet with supervision and consistent step through pattern. Patient able to recall and maintain hip precautions throughout all mobility performed.     Treatment plan: Continue plan of care     Subjective:   "I think I can make it back to the room."   Patient/family/caregiver consent to therapy session is noted by the participation in the therapy session.    Pain:  At Rest: 0 /10  With Activity: 3/10 via FACES  Location: Hip:  right  Interventions: Cold applied, Repositioned, Rest    OBJECTIVE:   Observation of Patient/Vital Signs:   Patient is in bed with dressings  Patient's medical condition is appropriate for Physical therapy intervention at this time.    Vital Signs:  Stable with no signs/symptoms of distress    Edema: present in RLE  Skin Inspection: dressing intact    Oriented to: Oriented x4  Command following: Follows ALL commands and directions  without difficulty  Alertness/Arousal: Appropriate responses to stimuli   Attention Span:Appears intact  Memory: Appears intact  Safety Awareness: minimal verbal instruction  Insights: Educated in safety awareness    Musculoskeletal and Balance Details:   Patient able to perform all transfers and ambulate with FWW and no loss of balance.          Bed Mobility:   Supine to Sit:   Minimal assist.   Cues for Sequencing., assist at LE(s)  Seated Scooting:   Supervision    Transfers:  Sit to Stand:  Supervision with Front wheeled walker.    Cues for Sequencing, Cues for Hand Placement  Stand to Sit:  Supervision.    Cues for  Hand Placement    Locomotion:  LEVEL AMBULATION:  Distance: 250 feet   Assistance level:  Supervision  Device:  Front wheeled walker  Pattern:  Step to, Step through, Decreased cadence, Decreased step length:  left, Decreased stance time:  right, Decreased clearance:  bilaterally  Gait train details:cues for walker management, cues for gait sequencing  STAIR MANAGEMENT:  Number of steps: 3  Supervision  Single point cane  One rail  Step-to pattern  Forward    Other Treatment Interventions this session:   Therapeutic exercise:  Ankle pumps: 10  Quad sets: 10  Gluteal sets: 10  Heel slides: 10  Straight leg raises: 10  Short Arc Quads: 10  Hip abd/add slides: 10  Therapeutic activity  Herbalist education     Education Provided:   TOPICS: role of physical therapy, plan of care, goals of therapy and safety with mobility and ADLs, benefits of activity, hip precautions, use of adaptive equipment, bed mobility with use of adaptive equipment or strategy, activity with nursing    Learner educated: Patient  Method: Explanation  Response to education: Verbalized understanding    Patient Position at End of Treatment:   Sitting, at the edge of the bed, Staff present: OT- Sarah, Needs in reach and No distress    Team Communication:   Spoke to : RN/LPN - Byrd Hesselbach  Regarding: Pre-session re: patient status, Patient position at end of session, Patient participation with Therapy  Whiteboard updated: No  PT/PTA communication: via written note and verbal communication as needed.    Time of treatment:   Time Calculation  PT Received On: 05/20/18  Start Time: 1309  Stop Time: 1348  Time Calculation (min): 39 min    Duard Brady, LPTA

## 2018-05-20 NOTE — Progress Notes (Signed)
Readmission Risk  [facility] - ORTHOPAEDICS   Patient Name: [name]   Attending Physician: Lesia Hausen Ile*   Today's date:   05/20/2018 LOS: 0 days   Expected Discharge Date      Readmission Assessment:                                                              Discharge Planning  ReAdmit Risk Score: 10  Does the patient have perscription coverage?: (P) Yes  Confirmed PCP with Pt: No  Confirm Transport to F/U Appt.: Self/Private Vehicle/Friend  Social Work Referral: Not Applicable  DME anticipated at discharge: Front wheel walker, Single point cane (FWW and cane in rm)  Anticipated Home Health at [facility]: No  Anticipated Placement at [facility]: No  CM Comments: 6/18 RNCM: POD # 1 THR  PT OT consulted.  On Eliquis preop. FWW in rm. Pt attended preop total joint class . He lives alone in [place] but will stay with a friend in the [place] , [place] area for " a couple of weeks " outpt PT appt arranged preop at [facility] in [place], [place] . New rx's obtained thru [facility] this am .   Healthcare Agent's Name: Nils Pyle - sister [phone] cell or Leeroy Bock [phone]  Healthcare Agent's Phone Number: n\a   IDPA:   Patient Type  Within 30 Days of Previous Admission?: (P) No  Healthcare Decisions  Interviewed:: (P) Patient  Orientation/Decision Making Abilities of Patient: (P) Alert and Oriented x3, able to make decisions  Advance Directive: Patient has advance directive, copy in chart  Healthcare Agent Appointed: No  Healthcare Agent's Name: Nils Pyle - sister [phone] cell or Leeroy Bock [phone]  Healthcare Agent's Phone Number: n\a  Prior to admission  Prior level of function: (P) Independent with ADLs  Type of Residence: (P) Private residence  Home Layout: (P) One level (in [place] with 2 steps or a ramp to enter )  Have running water, electricity, heat, etc?: (P) Yes  Living Arrangements: (P) Alone  How do you get to your MD appointments?: (P) self/others  How do you get your  groceries?: (P) self/others  Who fixes your meals?: (P) self/others  Who does your laundry?: (P) self/others  Who picks up your prescriptions?: (P) self/others  Dressing: (P) Independent  Grooming: (P) Independent  Feeding: (P) Independent  Bathing: (P) Independent  Toileting: (P) Independent  DME Currently at Home: (P) Single point cane, Front wheel walker  Discharge Planning  Support Systems: (P)  (Friend )  Patient expects to be discharged to:: (P)  (Friend's home in the [place], [place] area )  Anticipated [place] plan discussed with:: (P) Same as interviewed  Mode of transportation:: (P) Private car (family member)  Does the patient have perscription coverage?: (P) Yes  Consults/Providers  PT Evaluation Needed: (P) Yes (Comment)  OT Evalulation Needed: (P) Yes (Comment)  SLP Evaluation Needed: (P) No  Correct PCP listed in Epic?: (P) No (comment) (none listed No local PCP)      30 Day Readmission:       Provider Notifications:        Met with pt this am to review the plan of care and [facility] planning needs.    Plans to go to a friend's  home " for a couple of weeks" in the [place], [place] prior to going back to [facility].    Has access to DME.     Outpt PT appt arranged preop at [facility] in [place], [place] ( appt confirmed this am )    Haze Boyden RN [facility] BSN ONC  Orthopaedic Nurse Case Manager  [facility]  Cell : [phone]

## 2018-05-20 NOTE — PT Progress Note (Signed)
Physical Therapy PROGRESS note                             VHS: [facility]  Patient: [name]     CSN: [id number]    Bed: 423/423-A    Visit#: 2   Treatment Frequency: twice a day  Last seen by a physical therapist vs. Physical therapist assistant: 05/19/2018     DISCHARGE RECOMMENDATIONS   Discharge Recommendations:   Home with supervision;Home with outpatient PT   *Discharge recommendations are subject to change based on patient's progress and/or home support changes - please refer to most recent PT note for current recommendation     DME recommended for Discharge:   Has needed equipment     PMP (Progressive Mobility Program) Recommendations:   Recommend patient perform antiembolic exercises 10 reps hourly and ambulate with wheeled walker with staff as patient tolerates. Perform lower extremity therapeutic exercises listed on handout 2-3x/day as tolerated.      Precautions and Contraindications:   Weight Bearing: Right LE Weight bearing as tolerated  THA Precautions: Avoid combination of hip hyperextension and external rotation  Falls  Mobility protocol      PT Assessment and Plan of Care (Treatment frequency noted above):      HPI (per physician charting) and Pertinent Medical Details:  Admitted 05/19/2018 with right hip osteoarthritis and pain. Patient now s/p right total hip arthroplasty performed by Dr. Emeline Darling (anterior approach, general anesthesia).      Goals:    STG=LTG: by discharge  1. Patient will perform bed mobility with independence in prep for out of bed activity. Ongoing  2. Patient will perform sit to stand transfers with modified independence with use of wheeled walker in prep for gait. Ongoing    3. Patient will ambulate 300 feet with modified independence with wheeled walker in prep for home mobility. Ongoing    4. Patient will ascend/descend 3 step(s) with 1 rail(s) with cane (if needed) with supervision in order to demonstrate ability to enter home. Ongoing    5. (THA  Only) Patient will be able to verbalize and demonstrate adherence to hip precautions in order to promote overall safe mobility. Ongoing  6. Patient and/or caregiver will demonstrate independence with LE HEP in order to improve ROM and strength in prep for transfers and gait. Ongoing     PT Assessment:  Patient's progress towards established goals: Patient making good progress towards goals this session. He demonstrates R quad weakness, but reported intact sensation. Assistance required with performance of LE exercises due to quad weakness. He was able to complete 50 feet of ambulation with supervision and no buckling of RLE.      Treatment plan: Continue plan of care     Subjective:   "My quad was very weak prior this surgery."   Patient/family/caregiver consent to therapy session is noted by the participation in the therapy session.    Pain:  At Rest: 0 /10  With Activity: 4/10  Location: Hip:  right  Interventions: Cold applied, Repositioned, Rest    OBJECTIVE:   Observation of Patient/Vital Signs:   Patient is in bed with dressings, peripheral IV  Patient's medical condition is appropriate for Physical therapy intervention at this time.    Vital Signs:  Stable with no signs/symptoms of distress    Edema: present in RLE  Skin Inspection: dressing intact    Oriented to: Oriented x4  Command following:  Follows ALL commands and directions without difficulty  Alertness/Arousal: Appropriate responses to stimuli   Attention Span:Appears intact  Memory: Appears intact  Safety Awareness: minimal verbal instruction  Insights: Educated in safety awareness    Musculoskeletal and Balance Details:   Patient able to perform all transfers and ambulate with FWW and no loss of balance.          Bed Mobility:   Supine to Sit:   Minimal assist.   Cues for Sequencing., assist at LE(s)  Sit to Supine:   Minimal assist.   Cues for Sequencing., assist at LE(s)  Seated Scooting:   Supervision    Transfers:  Sit to Stand:  Supervision  with Front wheeled walker.    Cues for Sequencing, Cues for Hand Placement  Stand to Sit:  Supervision.    Cues for Hand Placement    Locomotion:  LEVEL AMBULATION:  Distance: 50 feet   Assistance level:  Supervision  Device:  Front wheeled walker  Pattern:  Step to, Step through, Decreased cadence, Decreased step length:  left, Decreased stance time:  right, Decreased clearance:  bilaterally  Gait train details:cues for walker management, cues for gait sequencing  Distance limited by: PTA for proactive pain management    Other Treatment Interventions this session:   Therapeutic exercise:  Ankle pumps: 10  Quad sets: 10  Gluteal sets: 10  Heel slides: 10  Straight leg raises: 10  Short Arc Quads: 10  Hip abd/add slides: 10  Therapeutic activity  Gait training  Patient/family/caregiver education     Education Provided:   TOPICS: role of physical therapy, plan of care, goals of therapy and safety with mobility and ADLs, benefits of activity, hip precautions, use of adaptive equipment, bed mobility with use of adaptive equipment or strategy, activity with nursing    Learner educated: Patient  Method: Explanation  Response to education: Verbalized understanding    Patient Position at End of Treatment:   Supine, in bed, Needs in reach and No distress    Team Communication:   Spoke to : RN/LPN Byrd Hesselbach, OT- Sarah  Regarding: Pre-session re: patient status, Patient position at end of session, Patient participation with Therapy  Whiteboard updated: No  PT/PTA communication: via written note and verbal communication as needed.    Time of treatment:   Time Calculation  PT Received On: 05/20/18  Start Time: 0900  Stop Time: 0932  Time Calculation (min): 32 min    Duard Brady, LPTA

## 2018-05-20 NOTE — Plan of Care (Signed)
Problem: Hip Surgery  Goal: Free from Infection  Outcome: Progressing   05/20/18 0052   Goal/Interventions addressed this shift   Free from infection Monitor/assess vital signs;Maintain temperature within desired parameters;Assess for signs and symptoms of infection;Assess surgical dressing, reinforce or change as needed per order;Teach/reinforce use of incentive spirometer 10 times per hour while awake, cough and deep breath as needed;Administer and discontinue antibiotics per SCIP measures     Goal: Nutritional Intake is Adequate  Outcome: Progressing   05/20/18 0052   Goal/Interventions addressed this shift   Nutritional intake is adequate Assess GI status (bowel sounds, nausea/vomiting, distention, flatus);Administer anti-emetic if needed as prescribed;Administer stool softener as prescribed;Advance diet as ordered and tolerated     Goal: Neurovascular Status is Stable  Outcome: Progressing   05/20/18 0052   Goal/Interventions addressed this shift   Neurovascular status is stable  Assess and document plantar/dorsiflexion;Monitor/assess neurovascular status (pulses, capillary refill, pain, paresthesia, presence of edema);VTE prevention: administer anticoagulant(s) and/or apply anti-embolism stockings/devices as ordered     Goal: Hemodynamic Stability  Outcome: Progressing   05/20/18 0052   Goal/Interventions addressed this shift   Hemodynamic stability  Monitor/assess vital signs;Maintain temperature within desired parameters;Monitor SpO2 and treat as needed;Monitor intake and output. Notify LIP if urine output is less than 30 ml/hr.;Monitor/assess surgical drainage output if drain is present;Monitor/assess lab values and report abnormal values     Goal: Mobility/activity is maintained at optimum level for patient  Outcome: Progressing   05/20/18 0052   Goal/Interventions addressed this shift   Mobility/activity is maintained at optimal level for patient Evaluate if patient comfort function goal is met;Utilize  special equipment (trapeze, abduction pillow, regular pillow, walker) as needed and as ordered;Dangle with assistance if indicated;Out of bed to chair with assistance;PT and/or OT evaluation and treatment if ordered;Teach/review/reinforce weight bearing status with patient/patient care companion;Teach/review/reinforce hip precautions with patient/patient care companion;Teach/review/reinforce exercises (ankle pumps, quad sets, gluteal sets);Participate in PT and/or OT plan of care (bed mobility, transfer/gait training, ADL, curb stair training);Ambulate more independently     Goal: Pain at adequate level as identified by patient  Outcome: Progressing   05/20/18 0052   Goal/Interventions addressed this shift   Pain at adequate level as identified by patient  Identify patient comfort function goal;Evaluate if patient comfort function goal is met;Administer oral pain medications as prescribed;Initiate and reinforce use of PCA/PCE, if ordered;Reposition patient every 2 hours and PRN unless able to self-reposition;Utilize special equipment (trapeze, abduction pillow, regular pillow, walker) as needed and as ordered;Ice therapy as indicated       Problem: Moderate/High Fall Risk Score >5  Goal: Patient will remain free of falls  Outcome: Progressing   05/20/18 0052   Moderate Risk Falls Interventions (6-13)   VH Moderate Risk (6-13) ALL REQUIRED LOW INTERVENTIONS;INITIATE YELLOW "FALL RISK" SIGNAGE;YELLOW NON-SKID SLIPPERS;YELLOW "FALL RISK" ARM BAND;USE OF BED EXIT ALARM IF PATIENT IS CONFUSED OR IMPULSIVE. PLACE RESET BED ALARM SIGN ABOVE BED;PLACE FALL RISK LEVEL ON WHITE BOARD FOR COMMUNICATION PURPOSES IN PATIENT'S ROOM;Use assistive devices

## 2018-05-20 NOTE — OT Eval Note (Signed)
Thank you for allowing [place] to participate in the care of  [name].  Regulations from the [employer] for Medicare and Medicaid Services (CMS) require your review and approval for this Plan of Care.  Please co-sign this note indicating you are in agreement with the Occupational Therapy Plan of Care.    [facility]: [facility]  Department of Rehabilitation Services: [phone]    Zuhair Lariccia    CSN#: [id number]  ORTHOPAEDICS 423/423-A    Occupational Therapy Evaluation    Consult received for [name] for OT Evaluation and Treatment.  Patient's medical condition is appropriate for Occupational therapy intervention at this time.    Time of treatment:   Time Calculation  OT Received On: 05/20/18  Start Time: 1345  Stop Time: 1410  Time Calculation (min): 25 min    Visit#: 01    Precautions and Contraindications:   Weight Bearing: Right LE Weight bearing as tolerated  THA Precautions: Avoid combination of hip hyperextension and external rotation  Falls  Mobility protocol     OT Assessment/Clinical Decision Making:      [name] is a 62 y.o. male admitted 05/19/2018 presenting with right hip osteoarthritis; s/p  right anterior total hip arthroplasty. Pt continues to be limited by decreased independence with IADLs/Home management tasks, which edu has been provided regarding home safety however will have 24/7 assistance upon d/c. Pt's CLOF= (S) to Mod Indep for self care tasks and BR functional mobility/transfers using AE/AT/DME prn. No further acute OT needs, d/c OT services.     Rehabilitation Potential: No further acute OT needs      Risks/benefits/POC discussed: Patient    Plan:   Treatment/interventions: no skilled acute care interventions needed at this time    Treatment Frequency: OT Frequency Recommended: one time visit - therapy discontinued    Goals:   N/A     DISCHARGE RECOMMENDATIONS   DME recommended for Discharge:   Has needed equipment    Discharge Recommendations:    Home with supervision   assist for IADLs    Medical & Therapy History:   Medical Diagnosis: Osteoarthritis of right hip, unspecified osteoarthritis type [M16.11]      X-Rays/Tests/Labs:  Xr Pelvis Limited 1 Or 2 Views    Result Date: 05/19/2018  No acute abnormality. ReadingStation:[id number]      Discussed with patient/family/caregiver the patient's physical, cognitive and/or psychosocial history related to current functional performance: yes        Patient Active Problem List   Diagnosis   . Osteoarthritis of right hip        Past Medical/Surgical History:  Past Medical History:   Diagnosis Date   . Atrial flutter    . CAD (coronary artery disease)    . Carpal tunnel syndrome     H/O   . DM2 (diabetes mellitus, type 2)    . ED (erectile dysfunction)    . Encounter for blood transfusion    . Hypertension    . MI (myocardial infarction) '08, '11   . OA (osteoarthritis)    . Psoriasis       Past Surgical History:   Procedure Laterality Date   . ARTHROPLASTY, HIP, TOTAL, ANTERIOR APPROACH, C ARM Right 05/19/2018    Procedure: ARTHROPLASTY, HIP, TOTAL, ANTERIOR APPROACH, C ARM;  Surgeon: Len Childs, MD;  Location: [facility];  Service: Orthopedics;  Laterality: Right;  RIGHT TOTAL HIP REPLACEMENT, ASI    . BICEPS TENDON REPAIR  Right    . CARDIOVERSION     . CORONARY ARTERY BYPASS GRAFT  2011   . CYST REMOVAL      RIGHT CHEEK   . INGUINAL HERNIA REPAIR Right     AS CHILD   . KNEE ARTHROTOMY Left 1975   . ORIF, RADIUS, DISTAL (WRIST) Right    . SINUS SURGERY           Occupational Profile:   Home Living Arrangements  Living Arrangements: Alone in [place] but is here in [place] for surgery and staying with ex girlfriend (Dr. 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      Patient/caregiver goal for OT: return home     Patient/caregiver concerns & priorities: none voiced     Subjective:   "I'm   Patient is agreeable to participation in the therapy session. Nursing clears patient for therapy.    Pain:  At Rest: 0 /10  With Activity: 3/10  Location: Hip:  right  Interventions: Medication (see eMAR), Cold applied, Repositioned, RN/LPN aware, Rest    ASSESSMENT OF OCCUPATIONAL PERFORMANCE:   Observation of Patient:    Patient is seated at edge of bed with no medical equipment            Vital Signs:   Stable with no signs/symptoms of distress     Oriented to: Oriented x4  Command following: Follows ALL commands and directions without difficulty  Safety Awareness: Independent  Insights: Fully aware of deficits  Problem Solving: Able to problem solve independently  Behavior: Cooperative    Musculoskeletal Examination:   Range of motion:  Bilateral UE: Grossly WFL       Strength:  Bilateral UE: Grossly WFL         Sensory/Oculomotor Examination:   Auditory:  WFL=intact  Visual Acuity:  WFL=intact           Activities of Daily Living:   Eating:  Independent; , seated at edge of bed; , beverage management, simulated  Grooming: Independent, Simulated, seated at edge of bed  UB dressing:  Independent, thread RUE, thread LUE, pull over head, pull down in back, seated at edge of bed  LB dressing: Minimal assist, overall initially; s/p education and cues for better positioning for LB AE use Pt demonstrated supervision, thread RLE into pants, thread LLE into pants, pull up over hips, don/doff R sock, don/doff L sock, sock-aid, reacher, seated at edge of bed, standing with   Front wheeled walker  Bathing:  Supervision/Set up;, Simulated, seated at edge of bed, standing with  Front wheeled walker  Toileting:  Supervision/Set up;, clothing management up, clothing management down, from eob; has RTS          Functional Mobility:   Bed Mobility:  Sit to Supine:   Modified independence .   Cues for Sequencing., Cues for Hand placement.,  using left LE under right LE    Transfers:  Sit to Stand:  Supervision with Front wheeled walker.   Cues for Hand Placement  Stand to Sit:  Supervision.   Cues for Hand Placement  Functional mobility/ambulation: Supervision with Front wheeled walker.      Balance:   Good; standing with FWW    Participation and Activity Tolerance   Participation effort: Good  Activity Tolerance: Activity tolerance does not limit participation    Other Treatment Interventions:   Treatment Activities:   Equipment evaluation/education: LB AE  Compensation technique training   OT eval completed           Education Provided:   Topics: Role of occupational therapy, plan of care, goals of therapy and safety with mobility and ADLs, benefits of activity, hip precautions, home safety, bed mobility with use of adaptive equipment or strategy, activity with nursing.    Individuals educated: Patient.  Method: Explanation and Demonstration.  Response to education: Verbalized understanding and Demonstrated understanding.    Team Communication:   OT communicated with: RN/LPN - Byrd Hesselbach; PTAElfredia Nevins   OT communicated regarding: Pre-session re: patient status, Patient position at end of session, Discharge needs, Patient participation with Therapy  OT/COTA communication: via written note and verbal communication as needed.    Supine, in bed, Needs in reach, No distress, SCD's/foot pumps applied and Ice applied    Recommend client participate in own self care tasks with setup and supervision for safety precaution and use of LB AE outside of and in addition to OT session.    Ezzie Dural, OTR/L

## 2018-05-26 ENCOUNTER — Emergency Department: Payer: BC Managed Care – PPO

## 2018-05-26 ENCOUNTER — Inpatient Hospital Stay
Admission: EM | Admit: 2018-05-26 | Discharge: 2018-06-03 | DRG: 467 | Disposition: A | Discharge status: Rehabilitation Facility | Payer: BC Managed Care – PPO | Attending: Hospice and Palliative Medicine | Admitting: Hospice and Palliative Medicine

## 2018-05-26 DIAGNOSIS — M1611 Unilateral primary osteoarthritis, right hip: Secondary | ICD-10-CM | POA: Diagnosis present

## 2018-05-26 DIAGNOSIS — I2510 Atherosclerotic heart disease of native coronary artery without angina pectoris: Secondary | ICD-10-CM | POA: Diagnosis present

## 2018-05-26 DIAGNOSIS — Y9289 Other specified places as the place of occurrence of the external cause: Secondary | ICD-10-CM

## 2018-05-26 DIAGNOSIS — S72301A Unspecified fracture of shaft of right femur, initial encounter for closed fracture: Secondary | ICD-10-CM

## 2018-05-26 DIAGNOSIS — I252 Old myocardial infarction: Secondary | ICD-10-CM

## 2018-05-26 DIAGNOSIS — W19XXXA Unspecified fall, initial encounter: Secondary | ICD-10-CM

## 2018-05-26 DIAGNOSIS — D62 Acute posthemorrhagic anemia: Secondary | ICD-10-CM | POA: Diagnosis not present

## 2018-05-26 DIAGNOSIS — I10 Essential (primary) hypertension: Secondary | ICD-10-CM | POA: Diagnosis present

## 2018-05-26 DIAGNOSIS — Z951 Presence of aortocoronary bypass graft: Secondary | ICD-10-CM

## 2018-05-26 DIAGNOSIS — E119 Type 2 diabetes mellitus without complications: Secondary | ICD-10-CM | POA: Diagnosis present

## 2018-05-26 DIAGNOSIS — S8001XA Contusion of right knee, initial encounter: Secondary | ICD-10-CM

## 2018-05-26 DIAGNOSIS — M9701XA Periprosthetic fracture around internal prosthetic right hip joint, initial encounter: Principal | ICD-10-CM | POA: Diagnosis present

## 2018-05-26 DIAGNOSIS — S7291XA Unspecified fracture of right femur, initial encounter for closed fracture: Secondary | ICD-10-CM

## 2018-05-26 DIAGNOSIS — E222 Syndrome of inappropriate secretion of antidiuretic hormone: Secondary | ICD-10-CM | POA: Diagnosis present

## 2018-05-26 DIAGNOSIS — Z7984 Long term (current) use of oral hypoglycemic drugs: Secondary | ICD-10-CM

## 2018-05-26 DIAGNOSIS — Z7901 Long term (current) use of anticoagulants: Secondary | ICD-10-CM

## 2018-05-26 DIAGNOSIS — I4892 Unspecified atrial flutter: Secondary | ICD-10-CM | POA: Diagnosis present

## 2018-05-26 DIAGNOSIS — W1830XA Fall on same level, unspecified, initial encounter: Secondary | ICD-10-CM | POA: Diagnosis present

## 2018-05-26 DIAGNOSIS — Y9389 Activity, other specified: Secondary | ICD-10-CM

## 2018-05-26 DIAGNOSIS — I4891 Unspecified atrial fibrillation: Secondary | ICD-10-CM

## 2018-05-26 DIAGNOSIS — Y998 Other external cause status: Secondary | ICD-10-CM

## 2018-05-26 DIAGNOSIS — Z79899 Other long term (current) drug therapy: Secondary | ICD-10-CM

## 2018-05-26 LAB — BASIC METABOLIC PANEL
Anion Gap: 14.4 mMol/L (ref 7.0–18.0)
BUN / Creatinine Ratio: 24.6 Ratio (ref 10.0–30.0)
BUN: 30 mg/dL — ABNORMAL HIGH (ref 7–22)
CO2: 26.7 mMol/L (ref 20.0–30.0)
Calcium: 9.7 mg/dL (ref 8.5–10.5)
Chloride: 98 mMol/L (ref 98–110)
Creatinine: 1.22 mg/dL (ref 0.80–1.30)
EGFR: 64 mL/min/{1.73_m2} (ref 60–150)
Glucose: 179 mg/dL — ABNORMAL HIGH (ref 71–99)
Osmolality Calc: 281 mOsm/kg (ref 275–300)
Potassium: 4.1 mMol/L (ref 3.5–5.3)
Sodium: 135 mMol/L — ABNORMAL LOW (ref 136–147)

## 2018-05-26 LAB — CBC AND DIFFERENTIAL
Basophils %: 0.3 % (ref 0.0–3.0)
Basophils Absolute: 0 10*3/uL (ref 0.0–0.3)
Eosinophils %: 0.6 % (ref 0.0–7.0)
Eosinophils Absolute: 0.1 10*3/uL (ref 0.0–0.8)
Hematocrit: 32.1 % — ABNORMAL LOW (ref 39.0–52.5)
Hemoglobin: 10.7 gm/dL — ABNORMAL LOW (ref 13.0–17.5)
Lymphocytes Absolute: 1.5 10*3/uL (ref 0.6–5.1)
Lymphocytes: 11.8 % — ABNORMAL LOW (ref 15.0–46.0)
MCH: 35 pg (ref 28–35)
MCHC: 33 gm/dL (ref 32–36)
MCV: 106 fL — ABNORMAL HIGH (ref 80–100)
MPV: 7.5 fL (ref 6.0–10.0)
Monocytes Absolute: 1 10*3/uL (ref 0.1–1.7)
Monocytes: 7.7 % (ref 3.0–15.0)
Neutrophils %: 79.7 % — ABNORMAL HIGH (ref 42.0–78.0)
Neutrophils Absolute: 9.9 10*3/uL — ABNORMAL HIGH (ref 1.7–8.6)
PLT CT: 416 10*3/uL (ref 130–440)
RBC: 3.04 10*6/uL — ABNORMAL LOW (ref 4.00–5.70)
RDW: 13.1 % (ref 11.0–14.0)
WBC: 12.4 10*3/uL — ABNORMAL HIGH (ref 4.0–11.0)

## 2018-05-26 LAB — VH DEXTROSE STICK GLUCOSE: Glucose POCT: 165 mg/dL — ABNORMAL HIGH (ref 71–99)

## 2018-05-26 MED ORDER — AMLODIPINE BESYLATE 5 MG PO TABS
10.00 mg | ORAL_TABLET | Freq: Every day | ORAL | Status: DC
Start: 2018-05-27 — End: 2018-06-03
  Administered 2018-05-27: 10:00:00 10 mg via ORAL
  Administered 2018-05-28: 08:00:00 5 mg via ORAL
  Administered 2018-05-30 – 2018-06-03 (×5): 10 mg via ORAL
  Filled 2018-05-26 (×7): qty 2

## 2018-05-26 MED ORDER — OXYCODONE-ACETAMINOPHEN 5-325 MG PO TABS
ORAL_TABLET | ORAL | Status: AC
Start: 2018-05-26 — End: ?
  Filled 2018-05-26: qty 1

## 2018-05-26 MED ORDER — CARVEDILOL 6.25 MG PO TABS
6.25 mg | ORAL_TABLET | Freq: Two times a day (BID) | ORAL | Status: DC
Start: 2018-05-27 — End: 2018-06-03
  Administered 2018-05-27 – 2018-06-03 (×14): 6.25 mg via ORAL
  Filled 2018-05-26 (×15): qty 1

## 2018-05-26 MED ORDER — LOSARTAN POTASSIUM 50 MG PO TABS
50.00 mg | ORAL_TABLET | Freq: Every day | ORAL | Status: DC
Start: 2018-05-27 — End: 2018-06-03
  Administered 2018-05-27 – 2018-06-03 (×6): 50 mg via ORAL
  Filled 2018-05-26 (×7): qty 1

## 2018-05-26 MED ORDER — HYDROMORPHONE HCL 1 MG/ML IJ SOLN
2.00 mg | INTRAMUSCULAR | Status: DC | PRN
Start: 2018-05-26 — End: 2018-05-28
  Administered 2018-05-26 – 2018-05-28 (×4): 2 mg via INTRAVENOUS
  Filled 2018-05-26 (×4): qty 2

## 2018-05-26 MED ORDER — OXYCODONE-ACETAMINOPHEN 5-325 MG PO TABS
1.00 | ORAL_TABLET | Freq: Four times a day (QID) | ORAL | Status: DC | PRN
Start: 2018-05-26 — End: 2018-05-27
  Administered 2018-05-27: 1 via ORAL
  Filled 2018-05-26: qty 1

## 2018-05-26 MED ORDER — SODIUM CHLORIDE 0.9 % IJ SOLN
3.00 mL | Freq: Three times a day (TID) | INTRAMUSCULAR | Status: DC
Start: 2018-05-26 — End: 2018-06-03
  Administered 2018-05-26 – 2018-06-03 (×8): 3 mL via INTRAVENOUS

## 2018-05-26 MED ORDER — NALOXONE HCL 0.4 MG/ML IJ SOLN (WRAP)
0.40 mg | INTRAMUSCULAR | Status: DC | PRN
Start: 2018-05-26 — End: 2018-06-02

## 2018-05-26 MED ORDER — MORPHINE SULFATE 4 MG/ML IJ/IV SOLN (WRAP)
4.0000 mg | Status: DC | PRN
Start: 2018-05-26 — End: 2018-05-26
  Administered 2018-05-26: 4 mg via INTRAVENOUS
  Filled 2018-05-26: qty 1

## 2018-05-26 MED ORDER — ONDANSETRON HCL 4 MG/2ML IJ SOLN
4.00 mg | Freq: Four times a day (QID) | INTRAMUSCULAR | Status: DC | PRN
Start: 2018-05-26 — End: 2018-06-03
  Administered 2018-05-27 – 2018-05-29 (×3): 4 mg via INTRAVENOUS
  Filled 2018-05-26 (×3): qty 2

## 2018-05-26 MED ORDER — HYDRALAZINE HCL 20 MG/ML IJ SOLN
10.00 mg | Freq: Four times a day (QID) | INTRAMUSCULAR | Status: DC | PRN
Start: 2018-05-26 — End: 2018-06-03
  Filled 2018-05-26: qty 1

## 2018-05-26 MED ORDER — TIZANIDINE HCL 4 MG PO TABS
4.00 mg | ORAL_TABLET | Freq: Two times a day (BID) | ORAL | Status: DC
Start: 2018-05-26 — End: 2018-06-03
  Administered 2018-05-26 – 2018-06-03 (×16): 4 mg via ORAL
  Filled 2018-05-26 (×16): qty 1

## 2018-05-26 MED ORDER — OXYCODONE-ACETAMINOPHEN 5-325 MG PO TABS
1.00 | ORAL_TABLET | Freq: Once | ORAL | Status: AC
Start: 2018-05-26 — End: 2018-05-26
  Administered 2018-05-26: 21:00:00 1 via ORAL

## 2018-05-26 MED ORDER — GLIPIZIDE 5 MG PO TABS
10.00 mg | ORAL_TABLET | Freq: Two times a day (BID) | ORAL | Status: DC
Start: 2018-05-27 — End: 2018-06-03
  Administered 2018-05-27 – 2018-06-03 (×13): 10 mg via ORAL
  Filled 2018-05-26 (×14): qty 2

## 2018-05-26 NOTE — ED Notes (Signed)
Tech attempted to stick pt in left AC. Unsuccessful. RN successful for IV in Right [facility]

## 2018-05-26 NOTE — ED Provider Notes (Signed)
[facility]  EMERGENCY DEPARTMENT  History and Physical Exam       Patient Name: [name], [name]  Encounter Date:  05/26/2018  Physician Assistant: Gweneth Dimitri, PA-C  Attending Physician: Fabian Sharp, MD  PCP: Md, Out Of Network  Patient DOB:  1956/11/11  MRN:  [id number]  Room:  386/386-A      History of Presenting Illness     Chief complaint: Fall and Post-op Problem    HPI/ROS given by: Patient    [name] is a 61 y.o. male history of diabetes mellitus type 2, hypertension, atrial flutter on Eliquis, and coronary artery disease who presents with right hip pain and right knee pain after falling today. He had a right total hip replacement on 05/19/2018 with dr. Ivar Bury.  He was exercising/doing physical therapy and fell.  He landed on his right knee and then on his right hip.  His right leg appears to be more swollen than usual.  He was worried that he "knocked something loose".  He denies fever/chills, rashes, chest pain, palpitations, pleuritic pain, or shortness of breath.       Review of Systems     Review of Systems   Constitutional: Negative for chills and fever.   HENT: Negative.    Eyes: Negative.    Respiratory: Negative.  Negative for shortness of breath.    Cardiovascular: Negative for chest pain and palpitations.   Gastrointestinal: Negative for abdominal pain, nausea and vomiting.   Genitourinary: Negative.    Musculoskeletal: Positive for arthralgias, gait problem and joint swelling. Negative for back pain and neck pain.   Skin: Positive for color change. Negative for wound.   Neurological: Negative for dizziness, light-headedness, numbness and headaches.         Rest of review of systems is negative.  Allergies & Medications     Pt has No Known Allergies.    Current Discharge Medication List      CONTINUE these medications which have NOT CHANGED    Details   acetaminophen (TYLENOL) 325 MG tablet Take 2 tablets (650 mg total) by mouth every 4 (four) hours       amLODIPine (NORVASC) 5 MG tablet Take 5 mg by mouth daily          apixaban (ELIQUIS) 5 MG Take 5 mg by mouth every 12 (twelve) hours          carvedilol (COREG) 6.25 MG tablet TAKE 1/2 TABLET BY MOUTH TWICE A DAY WITH A MEAL      empagliflozin (JARDIANCE) 10 MG tablet TAKE 10 MG BY MOUTH DAILY.      furosemide (LASIX) 20 MG tablet Take 40 mg by mouth as needed      glipiZIDE (GLUCOTROL) 10 MG tablet TAKE 1 TABLET BY MOUTH TWICE A DAY BEFORE A MEAL      losartan (COZAAR) 50 MG tablet Take 25 mg by mouth daily          potassium chloride (MICRO-K) 10 MEQ CR capsule Take 10 mEq by mouth daily          SITagliptin-metFORMIN (JANUMET) 50-500 MG tablet Take 1 tablet by mouth 2 (two) times daily with meals          TiZANidine (ZANAFLEX) 4 MG capsule Take 4 mg by mouth 2 (two) times daily                  Past Medical History     Pt has a past  medical history of Atrial flutter; CAD (coronary artery disease); Carpal tunnel syndrome; DM2 (diabetes mellitus, type 2); ED (erectile dysfunction); Encounter for blood transfusion; Hypertension; MI (myocardial infarction) ('08, '11); OA (osteoarthritis); and Psoriasis.     Past Surgical History     Pt  has a past surgical history that includes Knee arthrotomy (Left, 1975); Coronary artery bypass graft (2011); Cardioversion; ORIF, RADIUS, DISTAL (WRIST) (Right); Biceps tendon repair (Right); Inguinal hernia repair (Right); Sinus surgery; Cyst Removal; and ARTHROPLASTY, HIP, TOTAL, ANTERIOR APPROACH, C ARM (Right, 05/19/2018).     Family History     The family history is not on file.     Social History     Pt reports that he has never smoked. He has never used smokeless tobacco. He reports that he drinks alcohol. He reports that he does not use drugs.     Physical Exam     Blood pressure 93/53, pulse 80, temperature 98.1 F (36.7 C), temperature source Oral, resp. rate 22, height 1.93 m, weight 135.5 kg, SpO2 92 %.      Constitutional: Not in distress. Well-hydrated.  HEENT: Eyes:  PERRL, no scleral icterus or conjunctival pallor. Nose: no nasal discharge. Oral: MMM, oropharynx without erythema or exudate.  Neck: Trachea midline, supple, no thyroidmegaly or masses.  Cardiovascular: RRR, normal S1 S2, no murmurs, gallops, or rubs.   Respiratory: Normal rate. Clear to auscultation bilaterally.  Gastrointestinal: +BS, non-distended, soft, non-tender, no rebound or guarding, no hepatosplenomegaly.  Genitourinary: No suprapubic or costovertebral angle tenderness.  Musculoskeletal: Right anterior upper thigh pain near right hip hip pain. Right knee bruising.  Right leg swelling with old lateral thigh bruising.  Dorsal pedalis pulses 2+ and symmetric.  Good capillary refill.  Extremities: No edema or calf tenderness.  Skin: No rashes, lesions, or jaundice.  Neurologic: CN 2-12 grossly intact. No gross motor or sensory deficits.  Good sensations in right leg.  Able to flex/extend ankle and move/spread all toes.  Neurovascular intact.  Psychiatric: AAOx3, affect and mood appropriate. Alert, interactive, and appropriate.         Diagnostic Results     The results of the diagnostic studies below have been reviewed by myself:    Labs  Results     Procedure Component Value Units Date/Time    Dextrose Stick Glucose [[id number]]  (Abnormal) Collected:  05/27/18 2038    Specimen:  Blood Updated:  05/27/18 2055     Glucose, POCT 192 (H) mg/dL     Dextrose Stick Glucose [[id number]]  (Abnormal) Collected:  05/27/18 1808    Specimen:  Blood Updated:  05/27/18 1826     Glucose, POCT 224 (H) mg/dL     Dextrose Stick Glucose [[id number]]  (Abnormal) Collected:  05/27/18 1707    Specimen:  Blood Updated:  05/27/18 1731     Glucose, POCT 192 (H) mg/dL     Dextrose Stick Glucose [[id number]]  (Abnormal) Collected:  05/27/18 1332    Specimen:  Blood Updated:  05/27/18 1350     Glucose, POCT 246 (H) mg/dL     CBC and differential [[id number]]  (Abnormal) Collected:  05/27/18 0231    Specimen:  Blood from Blood Updated:   05/27/18 0345     WBC 9.2 K/cmm      RBC 2.87 (L) M/cmm      Hemoglobin 10.1 (L) gm/dL      Hematocrit 22.0 (L) %      MCV 106 (H) fL      MCH 35  pg      MCHC 33 gm/dL      RDW 70.6 %      PLT CT 393 K/cmm      MPV 7.6 fL      NEUTROPHIL % 68.5 %      Lymphocytes 20.0 %      Monocytes 10.2 %      Eosinophils % 0.8 %      Basophils % 0.5 %      Neutrophils Absolute 6.3 K/cmm      Lymphocytes Absolute 1.8 K/cmm      Monocytes Absolute 0.9 K/cmm      Eosinophils Absolute 0.1 K/cmm      BASO Absolute 0.0 K/cmm     Basic Metabolic Panel [[id number]]  (Abnormal) Collected:  05/27/18 0231    Specimen:  Plasma Updated:  05/27/18 0330     Sodium 137 mMol/L      Potassium 3.9 mMol/L      Chloride 99 mMol/L      CO2 25 mMol/L      Calcium 9.3 mg/dL      Glucose 176 (H) mg/dL      Creatinine 1.60 mg/dL      BUN 29 (H) mg/dL      Anion Gap 73.7 mMol/L      BUN/Creatinine Ratio 24.2 Ratio      EGFR 65 mL/min/1.31m2      Osmolality Calc 287 mOsm/kg     Dextrose Stick Glucose [[id number]]  (Abnormal) Collected:  05/26/18 2310    Specimen:  Blood Updated:  05/26/18 2326     Glucose, POCT 165 (H) mg/dL     CBC and differential [[id number]]  (Abnormal) Collected:  05/26/18 2128    Specimen:  Blood from Blood Updated:  05/26/18 2323     WBC 12.4 (H) K/cmm      RBC 3.04 (L) M/cmm      Hemoglobin 10.7 (L) gm/dL      Hematocrit 93.8 (L) %      MCV 106 (H) fL      MCH 35 pg      MCHC 33 gm/dL      RDW 18.2 %      PLT CT 416 K/cmm      MPV 7.5 fL      NEUTROPHIL % 79.7 (H) %      Lymphocytes 11.8 (L) %      Monocytes 7.7 %      Eosinophils % 0.6 %      Basophils % 0.3 %      Neutrophils Absolute 9.9 (H) K/cmm      Lymphocytes Absolute 1.5 K/cmm      Monocytes Absolute 1.0 K/cmm      Eosinophils Absolute 0.1 K/cmm      BASO Absolute 0.0 K/cmm      RBC Morphology RBC Morphology Reviewed     Macrocytic 1+     Anisocytosis 1+     Polychromasia 1+     Hypochromia 1+     Elliptocytes 1+     Poikilocytosis 1+     Target Cells 1+    Basic Metabolic  Panel [[id number]]  (Abnormal) Collected:  05/26/18 2128    Specimen:  Plasma Updated:  05/26/18 2235     Sodium 135 (L) mMol/L      Potassium 4.1 mMol/L      Chloride 98 mMol/L      CO2 26.7 mMol/L      Calcium 9.7 mg/dL  Glucose 179 (H) mg/dL      Creatinine 1.61 mg/dL      BUN 30 (H) mg/dL      Anion Gap 09.6 mMol/L      BUN/Creatinine Ratio 24.6 Ratio      EGFR 64 mL/min/1.89m2      Osmolality Calc 281 mOsm/kg           Radiologic Studies  Ct Hip Right Wo Contrast    Result Date: 05/27/2018  Postoperative change of right total hip replacement with right femur fracture involving the greater trochanteric region extending into the proximal and mid diaphyseal region of the right femur with lateral displacement of the fracture fragment distal to the shaft of the femoral prosthesis. Acetabular component appears in place with no acetabular fracture identified. ReadingStation:[id number]           ED Course and Medical Decision Making     ED Medication Orders     Start Ordered     Status Ordering Provider    05/26/18 2134 05/26/18 2134    Every 6 hours PRN     Route: Oral  Ordered Dose: 1 tablet     Discontinued Aleatha Taite, [facility] M    05/26/18 2110 05/26/18 2110  ondansetron (ZOFRAN) injection 4 mg  Every 6 hours PRN     Route: Intravenous  Ordered Dose: 4 mg     Last MAR action:  Given Lessie Funderburke, [facility] M    05/26/18 2041 05/26/18 2040  oxyCODONE-acetaminophen (PERCOCET) 5-325 MG per tablet 1 tablet  Once in ED     Route: Oral  Ordered Dose: 1 tablet     Last MAR action:  Given Richetta Cubillos, [facility] M        Patient was seen for right hip pain and right knee pain after falling.  He had a right total hip replacement on 05/19/2018.  He has more right leg swelling than usual.    He was given Percocet and Zofran ODT.    Doppler ultrasound of the right leg shows no DVT.    X-ray of the right hip shows acute fracture involving the mid femur adjacent to the distal tip of the femoral prosthesis    X-ray of the right knee shows no acute  findings.    I spoke to Dr. Blase Mess at 2115 and he felt the patient was appropriate to be admitted to medicine and he will have Dr. Emeline Darling see the patient in the morning.    I spoke to sound hospitalist at 2120 and the patient will be admitted to medicine awaiting orthopedic consult in the morning.    Get an EKG.    He is blood type A+ with a negative antibody screen that was done last week.    In addition to the above history, please see nursing notes. Allergies, meds, past medical, family, social hx, and the results of the diagnostic studies performed have been reviewed by myself.    I discussed this case with the attending physician in the emergency department and they agree with the assessment and treatment plan.     This chart was generated by an EMR and may contain errors or omissions not intended by the user.     Procedures / Critical Care     None     Diagnosis / Disposition     Clinical Impression  1. Closed fracture of shaft of right femur, unspecified fracture morphology, initial encounter    2. Contusion of right knee, initial encounter  3. Hypertension, unspecified type    4. Type 2 diabetes mellitus treated without insulin    5. Atrial flutter, unspecified type        Disposition  ED Disposition     ED Disposition Condition Date/Time Comment    Admit  Mon May 26, 2018  9:56 PM Admitting Physician: Roanna Epley [[id number]]   Diagnosis: Femur fracture, right [604540]   Estimated Length of Stay: > or = to 2 midnights   Tentative Discharge Plan?: Home or Self Care [1]   Patient Class: Inpatient [101]              Follow up for Discharged Patients  No follow-up provider specified.    Prescriptions for Discharged Patients  Current Discharge Medication List                  Margret Chance, [place]  05/27/18 1159       Fabian Sharp, MD  05/27/18 2223       Gweneth Dimitri [place], [place]  05/27/18 2234       Fabian Sharp, MD  05/27/18 [phone]

## 2018-05-26 NOTE — H&P (Signed)
ADMISSION HISTORY AND PHYSICAL EXAM    Date Time: 05/26/18 9:32 PM  Patient Name: [name],[name]  Attending Physician: Fabian Sharp, MD  Primary Care Physician: Md, Out Of Network    CC: fall , leg pain    History of Presenting Illness:   Ferrell Claiborne is a 62 y.o. male who presents to the hospital with  Fall and leg pain, found to have fracture  He has h/o AF/flutter on eliquis, CAD s/p CABG, DM, HTN, had scheduled right hip arthroplasty on 6-17 for chronic arthritis, hen d/c , he was at home and today he stood up and his right knee gave up and he fell down, landed on his right side, then had significant right leg pain. Came to ER, here XR showed femur fracture below prosthesis level    Dr zoller  called and he said that surgeon dr Emeline Darling will see patient tomorrow    Subjective   Past Medical History:     Past Medical History:   Diagnosis Date   . Atrial flutter    . CAD (coronary artery disease)    . Carpal tunnel syndrome     H/O   . DM2 (diabetes mellitus, type 2)    . ED (erectile dysfunction)    . Encounter for blood transfusion    . Hypertension    . MI (myocardial infarction) '08, '11   . OA (osteoarthritis)    . Psoriasis        Past Surgical History:     Past Surgical History:   Procedure Laterality Date   . ARTHROPLASTY, HIP, TOTAL, ANTERIOR APPROACH, C ARM Right 05/19/2018    Procedure: ARTHROPLASTY, HIP, TOTAL, ANTERIOR APPROACH, C ARM;  Surgeon: Len Childs, MD;  Location: [facility];  Service: Orthopedics;  Laterality: Right;  RIGHT TOTAL HIP REPLACEMENT, ASI    . BICEPS TENDON REPAIR Right    . CARDIOVERSION     . CORONARY ARTERY BYPASS GRAFT  2011   . CYST REMOVAL      RIGHT CHEEK   . INGUINAL HERNIA REPAIR Right     AS CHILD   . KNEE ARTHROTOMY Left 1975   . ORIF, RADIUS, DISTAL (WRIST) Right    . SINUS SURGERY         Family History:   Denies significant family history    Social History:     Social History     Social History   . Marital status: Divorced      Spouse name: N/A   . Number of children: N/A   . Years of education: N/A     Occupational History   . Not on file.     Social History Main Topics   . Smoking status: Never Smoker   . Smokeless tobacco: Never Used   . Alcohol use Yes      Comment: occasional    . Drug use: No   . Sexual activity: Not on file     Other Topics Concern   . Not on file     Social History Narrative   . No narrative on file       Allergies:   No Known Allergies    Medications:     Current Facility-Administered Medications:   .  ondansetron (ZOFRAN) injection 4 mg, 4 mg, Intravenous, Q6H PRN, Gweneth Dimitri M, [place]    Current Outpatient Prescriptions:   .  acetaminophen (TYLENOL) 325 MG tablet, Take 2 tablets (650 mg total)  by mouth every 4 (four) hours, Disp: , Rfl:   .  amLODIPine (NORVASC) 5 MG tablet, Take 10 mg by mouth daily   , Disp: , Rfl:   .  apixaban (ELIQUIS) 5 MG, Take 5 mg by mouth every 12 (twelve) hours   , Disp: , Rfl:   .  carvedilol (COREG) 6.25 MG tablet, TAKE 1 TABLET BY MOUTH TWICE A DAY WITH A MEAL, Disp: , Rfl:   .  empagliflozin (JARDIANCE) 10 MG tablet, TAKE 10 MG BY MOUTH DAILY., Disp: , Rfl:   .  furosemide (LASIX) 20 MG tablet, Take 40 mg by mouth as needed, Disp: , Rfl:   .  glipiZIDE (GLUCOTROL) 10 MG tablet, TAKE 1 TABLET BY MOUTH TWICE A DAY BEFORE A MEAL, Disp: , Rfl:   .  losartan (COZAAR) 50 MG tablet, Take 50 mg by mouth daily   , Disp: , Rfl:   .  oxyCODONE (ROXICODONE) 5 MG immediate release tablet, Take 1 tablet (5 mg total) by mouth every 3 (three) hours as needed for Pain, Disp: , Rfl:   .  potassium chloride (MICRO-K) 10 MEQ CR capsule, Take 10 mEq by mouth daily   , Disp: , Rfl:   .  SITagliptin-metFORMIN (JANUMET) 50-500 MG tablet, Take 1 tablet by mouth 2 (two) times daily with meals   , Disp: , Rfl:   .  TiZANidine (ZANAFLEX) 4 MG capsule, Take 4 mg by mouth 2 (two) times daily   , Disp: , Rfl:   .  traMADol (ULTRAM) 50 MG tablet, Take 1 tablet (50 mg total) by mouth every 6 (six) hours for 7  days, Disp: 28 tablet, Rfl: 0     Review of Systems:   All other systems were reviewed and negative except as noted above in HPI.         Objective     Labs:     Results     ** No results found for the last 24 hours. **          Imaging:     Radiology Results (24 Hour)     Procedure Component Value Units Date/Time    XR Knee Right 4+ Views [[id number]] Collected:  05/26/18 2104    Order Status:  Completed Updated:  05/26/18 2107    Narrative:       Clinical History:  Reason For Exam:  s/p tkr 6/17, multiple falls since, c/p r hip and knee pain    Study Notes:  Patient stated that his right knee gave out and he had a ground level fall.  Generalized pain, that radiates towards the right hip.     Examination:  XR KNEE RIGHT 4+ VIEWS    Technique: Right knee 4 views 5 images    Comparison:  None available.    Findings:  Bone mineralization appears within normal limits. No acute fracture or subluxation is identified. Traction enthesophyte is seen arising from the proximal patella. No gross abnormality is seen in the soft tissues.      Impression:         1.  No acute fracture is identified.      ReadingStation:[id number]    XR Hip Right 2-3 vw without pelvis [[id number]] Collected:  05/26/18 2101    Order Status:  Completed Updated:  05/26/18 2106    Narrative:       Clinical History:  Reason For Exam:  s/p tkr 6/17, multiple falls since, c/p r hip and knee  pain    Study Notes:  Patient stated that his right knee gave out and he had a ground level fall.  Patient is having generalized pain of the right hip. He had surgery on his right hip on 05/19/2018.     Examination:  AP and lateral/frog leg views of the right hip.    Comparison:  Prior right hip films from one week earlier.    Findings:  There is a new acute fracture involving the mid femur adjacent to the distal tip of the femoral prosthesis. Very minimal displacement at the fracture site. Right hip prosthesis intact without dislocation.      Impression:        Acute fracture involving the mid femur adjacent to the distal tip of the femoral prosthesis.    ReadingStation:[id number]    US Venous Leg Right [[id number]] Collected:  05/26/18 2008    Order Status:  Completed Updated:  05/26/18 2019    Narrative:       Clinical History:  Reason For Exam:  post op leg swelling    Study Notes:  Right hip surgery 7 days ago, fell today and now has increasing swelling and pain    Examination:  Two-dimensional grayscale, Doppler spectral waveform analysis and color flow Doppler imaging of the deep veins of the right lower extremity was performed from groin to calf.    Comparison:  None available.    Findings:  The visualized deep veins are widely patent with normal flow, compressibility, and augmentation.  Edema is noted in the subcutaneous tissues.      Impression:       No acute deep vein thrombosis is identified.    ReadingStation:[id number]               Physical Exam:   BP 155/89   Pulse 99   Temp 98.2 F (36.8 C) (Oral)   Resp 18   Wt 135 kg (297 lb 9.9 oz)   SpO2 97%   BMI 36.23 kg/m    Body mass index is 36.23 kg/m.    Constitutional: awake, alert, oriented x 3; no acute distress.  Eyes: perrla, eomi, sclera anicteric   ENT: oropharynx clear without lesions, mucous membranes moist  Neck: supple, no lymphadenopathy, no thyromegaly, no JVD, no carotid bruits  Cardiovascular: regular rate and irregul;ar  rhythm, no murmurs, rubs or gallops  Lungs: clear to auscultation bilaterally, without wheezing, rhonchi, or rales  Abdomen: soft, non-tender, non-distended; no palpable masses, no hepatosplenomegaly, normoactive bowel sounds, no rebound or guarding  Extremities: no clubbing, cyanosis. Right leg limited ROM due to pain  Neuro: cranial nerves grossly intact, strength 5/5 in upper and lower extremities sensation intact,   Skin: no rashes or lesions noted  Psychiatric: Awake, alert, orientedx3    Assessment / Plan:   Principal Problem:    Femur fracture, right   ER spoke  with Dr zoller  Dr Emeline Darling will see pt in AM (she did initial surgery last week)  Pt took eliquis this AM.hold         Osteoarthritis of right hip   s/p right hip replacement per dr Emeline Darling      CAD (coronary artery disease)  No chest pain  Resume eliquis after surgery or qhen ok with ortho      AF (atrial fibrillation)  Rate ok with BB  Will [place] eliquis for possible surgery      DM (diabetes mellitus)  Metabolic panel pending  Last a1c WNL  Will  not do aggressive FSBS  Will check BS with morning labs      HTN (hypertension)  Resume home meds  Add prn hydralazine      Signed by: Roanna Epley, MD -   cc:Md, Out Of Network

## 2018-05-26 NOTE — ED Notes (Signed)
Pt taken from RAU to U/S at this time. Will assess pt when he comes back to room

## 2018-05-26 NOTE — ED Notes (Signed)
Bed: RAU-A  Expected date:   Expected time:   Means of arrival:   Comments:  Ems eta 1852

## 2018-05-26 NOTE — ED Triage Notes (Signed)
Pt presents to the ED for right hip pain down to his ankle that began today. Pt reports having right hip surgery on 05/19/18. Pt states that he stood up today when his right knee gave out and he felt backwards and landed on his butt. Pt denies hitting his head/LOC. pT REPORTS BEING ON A BLOOD THINNER.Pt sis concerned that he might have "knocked something loose." No new swelling or bruising since fall

## 2018-05-26 NOTE — ED Notes (Signed)
Pt is resting comfortably in his stretcher at this time. Pt is alert and oriented x4 and VSS. Pt was informed of his room number and plan of care. No new or worsening sxs noted since last assessment. Pt rates pain 4/10

## 2018-05-27 ENCOUNTER — Inpatient Hospital Stay: Payer: BC Managed Care – PPO

## 2018-05-27 LAB — CBC AND DIFFERENTIAL
Basophils %: 0.5 % (ref 0.0–3.0)
Basophils Absolute: 0 10*3/uL (ref 0.0–0.3)
Eosinophils %: 0.8 % (ref 0.0–7.0)
Eosinophils Absolute: 0.1 10*3/uL (ref 0.0–0.8)
Hematocrit: 30.4 % — ABNORMAL LOW (ref 39.0–52.5)
Hemoglobin: 10.1 gm/dL — ABNORMAL LOW (ref 13.0–17.5)
Lymphocytes Absolute: 1.8 10*3/uL (ref 0.6–5.1)
Lymphocytes: 20 % (ref 15.0–46.0)
MCH: 35 pg (ref 28–35)
MCHC: 33 gm/dL (ref 32–36)
MCV: 106 fL — ABNORMAL HIGH (ref 80–100)
MPV: 7.6 fL (ref 6.0–10.0)
Monocytes Absolute: 0.9 10*3/uL (ref 0.1–1.7)
Monocytes: 10.2 % (ref 3.0–15.0)
Neutrophils %: 68.5 % (ref 42.0–78.0)
Neutrophils Absolute: 6.3 10*3/uL (ref 1.7–8.6)
PLT CT: 393 10*3/uL (ref 130–440)
RBC: 2.87 10*6/uL — ABNORMAL LOW (ref 4.00–5.70)
RDW: 13.3 % (ref 11.0–14.0)
WBC: 9.2 10*3/uL (ref 4.0–11.0)

## 2018-05-27 LAB — BASIC METABOLIC PANEL
Anion Gap: 16.9 mMol/L (ref 7.0–18.0)
BUN / Creatinine Ratio: 24.2 Ratio (ref 10.0–30.0)
BUN: 29 mg/dL — ABNORMAL HIGH (ref 7–22)
CO2: 25 mMol/L (ref 20–30)
Calcium: 9.3 mg/dL (ref 8.5–10.5)
Chloride: 99 mMol/L (ref 98–110)
Creatinine: 1.2 mg/dL (ref 0.80–1.30)
EGFR: 65 mL/min/{1.73_m2} (ref 60–150)
Glucose: 224 mg/dL — ABNORMAL HIGH (ref 71–99)
Osmolality Calc: 287 mOsm/kg (ref 275–300)
Potassium: 3.9 mMol/L (ref 3.5–5.3)
Sodium: 137 mMol/L (ref 136–147)

## 2018-05-27 LAB — VH DEXTROSE STICK GLUCOSE
Glucose POCT: 246 mg/dL — ABNORMAL HIGH (ref 71–99)
Glucose POCT: 192 mg/dL — ABNORMAL HIGH (ref 71–99)
Glucose POCT: 224 mg/dL — ABNORMAL HIGH (ref 71–99)
Glucose POCT: 192 mg/dL — ABNORMAL HIGH (ref 71–99)

## 2018-05-27 MED ORDER — GLUCAGON 1 MG IJ SOLR (WRAP)
1.00 mg | INTRAMUSCULAR | Status: DC | PRN
Start: 2018-05-27 — End: 2018-06-03

## 2018-05-27 MED ORDER — MORPHINE SULFATE 4 MG/ML IJ/IV SOLN (WRAP)
2.0000 mg | Status: DC | PRN
Start: 2018-05-27 — End: 2018-05-28
  Administered 2018-05-28 (×2): 2 mg via INTRAVENOUS
  Filled 2018-05-27 (×2): qty 1

## 2018-05-27 MED ORDER — INSULIN LISPRO 100 UNIT/ML SC SOPN
2.00 [IU] | PEN_INJECTOR | Freq: Three times a day (TID) | SUBCUTANEOUS | Status: DC
Start: 2018-05-27 — End: 2018-06-03
  Administered 2018-05-27: 17:00:00 2 [IU] via SUBCUTANEOUS
  Administered 2018-05-28: 17:00:00 3 [IU] via SUBCUTANEOUS
  Administered 2018-05-28: 08:00:00 2 [IU] via SUBCUTANEOUS
  Administered 2018-05-28: 13:00:00 3 [IU] via SUBCUTANEOUS
  Administered 2018-05-29: 19:00:00 4 [IU] via SUBCUTANEOUS
  Administered 2018-05-30: 12:00:00 3 [IU] via SUBCUTANEOUS
  Administered 2018-05-30 – 2018-05-31 (×4): 2 [IU] via SUBCUTANEOUS
  Administered 2018-05-31: 12:00:00 3 [IU] via SUBCUTANEOUS
  Administered 2018-06-01 – 2018-06-02 (×5): 2 [IU] via SUBCUTANEOUS
  Administered 2018-06-03: 17:00:00 3 [IU] via SUBCUTANEOUS
  Administered 2018-06-03: 13:00:00 2 [IU] via SUBCUTANEOUS
  Administered 2018-06-03: 08:00:00 3 [IU] via SUBCUTANEOUS

## 2018-05-27 MED ORDER — INSULIN LISPRO 100 UNIT/ML SC SOPN
1.00 [IU] | PEN_INJECTOR | Freq: Every day | SUBCUTANEOUS | Status: DC | PRN
Start: 2018-05-27 — End: 2018-06-03
  Administered 2018-05-27: 14:00:00 2 [IU] via SUBCUTANEOUS
  Administered 2018-06-01 – 2018-06-03 (×2): 1 [IU] via SUBCUTANEOUS
  Filled 2018-05-27: qty 3

## 2018-05-27 MED ORDER — DEXTROSE 10 % IV BOLUS
125.00 mL | INTRAVENOUS | Status: DC | PRN
Start: 2018-05-27 — End: 2018-06-03

## 2018-05-27 MED ORDER — OXYCODONE HCL 5 MG PO TABS
5.00 mg | ORAL_TABLET | Freq: Four times a day (QID) | ORAL | Status: DC | PRN
Start: 2018-05-27 — End: 2018-06-02
  Filled 2018-05-27: qty 1

## 2018-05-27 MED ORDER — OXYCODONE HCL 5 MG PO TABS
10.00 mg | ORAL_TABLET | Freq: Four times a day (QID) | ORAL | Status: DC | PRN
Start: 2018-05-27 — End: 2018-06-02
  Administered 2018-05-27 – 2018-06-02 (×16): 10 mg via ORAL
  Filled 2018-05-27 (×16): qty 2

## 2018-05-27 MED ORDER — SODIUM CHLORIDE 0.9 % IV SOLN
INTRAVENOUS | Status: DC
Start: 2018-05-28 — End: 2018-05-30

## 2018-05-27 MED ORDER — INSULIN LISPRO 100 UNIT/ML SC SOPN
1.00 [IU] | PEN_INJECTOR | Freq: Every evening | SUBCUTANEOUS | Status: DC
Start: 2018-05-27 — End: 2018-06-03
  Administered 2018-05-27 – 2018-05-28 (×2): 1 [IU] via SUBCUTANEOUS
  Administered 2018-05-29: 22:00:00 3 [IU] via SUBCUTANEOUS
  Administered 2018-05-30 – 2018-06-02 (×3): 1 [IU] via SUBCUTANEOUS

## 2018-05-27 MED ORDER — TRAMADOL HCL 50 MG PO TABS
50.00 mg | ORAL_TABLET | Freq: Four times a day (QID) | ORAL | Status: DC
Start: 2018-05-27 — End: 2018-06-03
  Administered 2018-05-27 – 2018-06-03 (×26): 50 mg via ORAL
  Filled 2018-05-27 (×28): qty 1

## 2018-05-27 NOTE — Plan of Care (Signed)
Problem: Moderate/High Fall Risk Score >5  Goal: Patient will remain free of falls  Outcome: Progressing   05/27/18 2042   High Risk Falls Interventions (Greater than 13)   VH High Risk (Greater than 13) ALL REQUIRED LOW INTERVENTIONS;ALL REQUIRED MODERATE INTERVENTIONS;RED "HIGH FALL RISK" SIGNAGE;BED ALARM WILL BE ACTIVATED WHEN THE PATEINT IS IN BED WITH SIGNAGE "RESET BED ALARM";A CHAIR PAD ALARM WILL BE USED WHEN PATIENT IS UP SITTING IN A CHAIR;PATIENT IS TO BE SUPERVISED FOR ALL TOILETING ACTIVITIES       Problem: Peripheral Neurovascular Impairment  Goal: Extremity color, movement, sensation are maintained or improved  Outcome: Progressing   05/27/18 0024   Goal/Interventions addressed this shift   Extremity color, movement, sensation are maintained or improved  Increase mobility as tolerated/progressive mobility;Assess and monitor application of corrective devices (cast, brace, splint), check skin integrity;Assess extremity for proper alignment;Teach/review/reinforce ankle pump exercises;VTE Prevention: Administer anticoagulant(s) and/or apply anti-embolism stockings/devices as ordered

## 2018-05-27 NOTE — Consults (Signed)
[facility] PROGRESS NOTE    Date Time: 05/27/18 7:32 AM  Patient Name: [name],[name]      Assessment:   R periprosthetic femur fracture, femoral subsidence, probable acetabular dislodgement    Plan:    Weight bearing status: NWB RLE   Precautions: fall   PT/OT: hold   Anticoagulation: per medicine    Follow up: CT scan - if acetabular component loose, will need revision specialist with transfer to another hospital.  If not loose, then will fix femur tomorrow in OR      Subjective:   Fell yesterday, unable to ambulate    Medications:     Current Facility-Administered Medications   Medication Dose Route Frequency   . amLODIPine  10 mg Oral Daily   . carvedilol  6.25 mg Oral BID Meals   . glipiZIDE  10 mg Oral BID AC   . losartan  50 mg Oral Daily   . sodium chloride (PF)  3 mL Intravenous Q8H   . tiZANidine  4 mg Oral BID         Physical Exam:     Vitals:    05/27/18 0041   BP: 120/65   Pulse: 82   Resp:    Temp:    SpO2:        Intake and Output Summary (Last 24 hours) at Date Time    Intake/Output Summary (Last 24 hours) at 05/27/18 0732  Last data filed at 05/27/18 0400   Gross per 24 hour   Intake                0 ml   Output              700 ml   Net             -700 ml         Neurological -  motor and sensory grossly normal  Musculoskeletal - SILT s/s/ta/dp/sp, toes wawp, +df/pf toes and ankle  Leg shortened    Labs:     Results     Procedure Component Value Units Date/Time    CBC and differential [[id number]]  (Abnormal) Collected:  05/27/18 0231    Specimen:  Blood from Blood Updated:  05/27/18 0345     WBC 9.2 K/cmm      RBC 2.87 (L) M/cmm      Hemoglobin 10.1 (L) gm/dL      Hematocrit 40.9 (L) %      MCV 106 (H) fL      MCH 35 pg      MCHC 33 gm/dL      RDW 81.1 %      PLT CT 393 K/cmm      MPV 7.6 fL      NEUTROPHIL % 68.5 %      Lymphocytes 20.0 %      Monocytes 10.2 %      Eosinophils % 0.8 %      Basophils % 0.5 %      Neutrophils Absolute 6.3 K/cmm      Lymphocytes  Absolute 1.8 K/cmm      Monocytes Absolute 0.9 K/cmm      Eosinophils Absolute 0.1 K/cmm      BASO Absolute 0.0 K/cmm     Basic Metabolic Panel [[id number]]  (Abnormal) Collected:  05/27/18 0231    Specimen:  Plasma Updated:  05/27/18 0330     Sodium 137 mMol/L      Potassium 3.9 mMol/L  Chloride 99 mMol/L      CO2 25 mMol/L      Calcium 9.3 mg/dL      Glucose 161 (H) mg/dL      Creatinine 0.96 mg/dL      BUN 29 (H) mg/dL      Anion Gap 04.5 mMol/L      BUN/Creatinine Ratio 24.2 Ratio      EGFR 65 mL/min/1.56m2      Osmolality Calc 287 mOsm/kg     Dextrose Stick Glucose [[id number]]  (Abnormal) Collected:  05/26/18 2310    Specimen:  Blood Updated:  05/26/18 2326     Glucose, POCT 165 (H) mg/dL     CBC and differential [[id number]]  (Abnormal) Collected:  05/26/18 2128    Specimen:  Blood from Blood Updated:  05/26/18 2323     WBC 12.4 (H) K/cmm      RBC 3.04 (L) M/cmm      Hemoglobin 10.7 (L) gm/dL      Hematocrit 08.6 (L) %      MCV 106 (H) fL      MCH 35 pg      MCHC 33 gm/dL      RDW 57.8 %      PLT CT 416 K/cmm      MPV 7.5 fL      NEUTROPHIL % 79.7 (H) %      Lymphocytes 11.8 (L) %      Monocytes 7.7 %      Eosinophils % 0.6 %      Basophils % 0.3 %      Neutrophils Absolute 9.9 (H) K/cmm      Lymphocytes Absolute 1.5 K/cmm      Monocytes Absolute 1.0 K/cmm      Eosinophils Absolute 0.1 K/cmm      BASO Absolute 0.0 K/cmm      RBC Morphology RBC Morphology Reviewed     Macrocytic 1+     Anisocytosis 1+     Polychromasia 1+     Hypochromia 1+     Elliptocytes 1+     Poikilocytosis 1+     Target Cells 1+    Basic Metabolic Panel [[id number]]  (Abnormal) Collected:  05/26/18 2128    Specimen:  Plasma Updated:  05/26/18 2235     Sodium 135 (L) mMol/L      Potassium 4.1 mMol/L      Chloride 98 mMol/L      CO2 26.7 mMol/L      Calcium 9.7 mg/dL      Glucose 413 (H) mg/dL      Creatinine 2.44 mg/dL      BUN 30 (H) mg/dL      Anion Gap 01.0 mMol/L      BUN/Creatinine Ratio 24.6 Ratio      EGFR 64 mL/min/1.2m2       Osmolality Calc 281 mOsm/kg           Rads:   Xr Hip Right 2-3 Vw Without Pelvis    Result Date: 05/26/2018  Acute fracture involving the mid femur adjacent to the distal tip of the femoral prosthesis. ReadingStation:[id number]    Xr Knee Right 4+ Views    Result Date: 05/26/2018  1.  No acute fracture is identified.  ReadingStation:[id number]    US Venous Leg Right    Result Date: 05/26/2018  No acute deep vein thrombosis is identified. ReadingStation:[id number]      Signed by: Len Childs, MD

## 2018-05-27 NOTE — Progress Note - Problem Oriented Charting Notewrit (Signed)
NURSE NOTE SUMMARY  [facility] - STEPDOWN UNIT   Patient Name: [name]   Attending Physician: Jamison Oka *   Today's date:   05/27/2018 LOS: 1 days   Shift Summary:                                                              Patient arrived from ED to RM 386. A&O x4. C/O pain 10/10 before and after receiving PRN Morphine. MD made aware. Order for PRN IV dilaudid received.    Provider Notifications:      Rapid Response Notifications:  Mobility:      PMP Activity: Step 3 - Bed Mobility (05/27/2018 12:00 AM)   Weight tracking:  Family Dynamic:   Last 3 Weights for the past 72 hrs (Last 3 readings):   Weight   05/26/18 2238 148.5 kg (327 lb 6.1 oz)   05/26/18 1902 135 kg (297 lb 9.9 oz)        Healthcare Agent's Name: [facility]  Healthcare Agent's Phone Number: [phone]   Recent Vitals Last Bowel Movement   BP (!) 172/100   Pulse (!) 104   Temp 98.2 F (36.8 C) (Oral)   Resp 18   Ht 1.93 m (6\' 4" )   Wt 148.5 kg (327 lb 6.1 oz)   SpO2 96%   BMI 39.85 kg/m  Last BM Date: 05/18/18

## 2018-05-27 NOTE — Plan of Care (Signed)
Problem: Compromised Tissue integrity  Goal: Damaged tissue is healing and protected  Outcome: Progressing   05/27/18 1259   Goal/Interventions addressed this shift   Damaged tissue is healing and protected  Provide wound care per wound care algorithm;Reposition patient every 2 hours and as needed unless able to reposition self;Increase activity as tolerated/progressive mobility;Relieve pressure to bony prominences for patients at moderate and high risk;Keep intact skin clean and dry;Use bath wipes, not soap and water, for daily bathing       Problem: Moderate/High Fall Risk Score >5  Goal: Patient will remain free of falls  Outcome: Progressing   05/27/18 1257   High Risk Falls Interventions (Greater than 13)   VH High Risk (Greater than 13) RED "HIGH FALL RISK" SIGNAGE;BED ALARM WILL BE ACTIVATED WHEN THE PATEINT IS IN BED WITH SIGNAGE "RESET BED ALARM";PATIENT IS TO BE SUPERVISED FOR ALL TOILETING ACTIVITIES;Keep door open for better visibility;Include family/significant other in multidisciplinary discussion regarding plan of care as appropriate;Request PT/OT therapy consult order from physician for patients with gait/mobility impairment;Use assistive devices       Problem: Pain interferes with ability to perform ADL  Goal: Pain at adequate level as identified by patient  Outcome: Progressing   05/27/18 1259   Goal/Interventions addressed this shift   Pain at adequate level as identified by patient Identify patient comfort function goal;Assess for risk of opioid induced respiratory depression, including snoring/sleep apnea. Alert healthcare team of risk factors identified.;Assess pain on admission, during daily assessment and/or before any "as needed" intervention(s);Reassess pain within 30-60 minutes of any procedure/intervention, per Pain Assessment, Intervention, Reassessment (AIR) Cycle;Evaluate if patient comfort function goal is met;Evaluate patient's satisfaction with pain management progress;Include  patient/patient care companion in decisions related to pain management as needed;Consult/collaborate with Physical Therapy, Occupational Therapy, and/or Speech Therapy

## 2018-05-27 NOTE — UM Notes (Addendum)
[name]  1956/07/29  ID:  [id number]  Ref:  [id number]    DOS 05/26/18  Inpatient     Presents to ED via EMS after a fall.  S/p post right hip arthroplasty on 6/17.  In ED BP up to 170/138    AF/flutter on eliquis, CAD s/p CABG, DM, HTN, had scheduled right hip arthroplasty on 6-17 for chronic arthritis    WBC 12.4    Right hip xray:  Acute fracture involving the mid femur adjacent to the distal tip of the femoral prosthesis.    Dr. Marcos Eke H&P:   "  Assessment / Plan:   Principal Problem:    Femur fracture, right   ER spoke with Dr zoller  Dr Emeline Darling will see pt in AM (she did initial surgery last week)  Pt took eliquis this AM.hold         Osteoarthritis of right hip   s/p right hip replacement per dr Emeline Darling      CAD (coronary artery disease)  No chest pain  Resume eliquis after surgery or qhen ok with ortho      AF (atrial fibrillation)  Rate ok with BB  Will [place] eliquis for possible surgery      DM (diabetes mellitus)  Metabolic panel pending  Last a1c WNL  Will not do aggressive FSBS  Will check BS with morning labs      HTN (hypertension)  Resume home meds  Add prn hydralazine"    Tx  IV Dilaudid prn x 1     DOS 05/27/18  csr     Glu 246    CT right hip  Postoperative change of right total hip replacement with right femur fracture involving the greater trochanteric region extending into the proximal and mid diaphyseal region of the right femur with lateral displacement of the fracture fragment distal to  the shaft of the femoral prosthesis. Acetabular component appears in place with no acetabular fracture identified.    Dr. Emeline Darling ortho consult:   "  Assessment:   R periprosthetic femur fracture, femoral subsidence, probable acetabular dislodgement    Plan:    Weight bearing status: NWB RLE   Precautions: fall   PT/OT: hold   Anticoagulation: per medicine    Follow up: CT scan - if acetabular component loose, will need revision specialist with transfer to another hospital.  If not loose, then will  fix femur tomorrow in OR"    TX  IV Dilaudid prn x 1   IV Zofran prn x 1       Andreas Blower, RN  [facility]   Utilization Management  Phone:  [phone]  Fax:  [phone]

## 2018-05-27 NOTE — Progress Notes (Signed)
Medicine Progress Note - [facility]  [employer]   Patient Name: [name],[name] MARK LOS: 1 days   Attending Physician: Alease Medina, MD PCP: Md, Out Of Network      Assessment and Plan:                                                                # Femur fracture, right periprosthetic  No evidence of acetabular abnormality on CT scan   Dr Emeline Darling will take him to the OR tomorrow for repair  Anticoagulation has been held since yesterday     # Osteoarthritis of right hip   s/p right hip replacement per Dr Emeline Darling    #  CAD (coronary artery disease)  No chest pain  Continue beta-blockers    #  AF (atrial fibrillation)  Rate ok with BB  off eliquis for possible surgery      DM (diabetes mellitus)  Last a1c WNL  Continue Glucotrol - hold a.m. dose tomorrow  Per sliding scale      HTN (hypertension)  Resume home meds  Add prn hydralazine    Disposition: SNF  DVT PPX:  SCDs  Code:  Full Code   Hospital Course   [name] is a 62 y.o. male who presents to the hospital with  Fall and leg pain, found to have fracture  He has h/o AF/flutter on eliquis, CAD s/p CABG, DM, HTN, had scheduled right hip arthroplasty on 6-17 for chronic arthritis, hen d/c , he was at home and today he stood up and his right knee gave up and he fell down, landed on his right side, then had significant right leg pain. Came to ER, here XR showed femur fracture below prosthesis level       EMR Generated Hospital Problem List      Subjective     Pain over fracture area      Objective   Physical Exam:     Vitals: T:97.5 F (36.4 C) (Oral), BP:(!) 166/94, HR:(!) 109, RR:16, SaO2:95%    General: Patient is awake. In no acute distress.  HEENT: PERRL, EOMI, no conjunctival drainage, vision is intact.  Neck: supple, no thyromegaly.  Chest: CTA bilaterally. No rhonchi, no wheezing. No use of accessory muscles.  CVS: normal rate and regular rhythm no murmurs, without JVD.  Abdomen: soft, non-tender, no guarding or rigidity,  with normal bowel sounds.  Extremities: No pitting edema, pulses palpable, no calf swelling and gross no deformity.  Skin: Warm, dry, no rash and no worrisome lesions.  NEURO: no motor or sensory deficits.  Psychiatric: alert, interactive, appropriate, normal affect.  Weight Monitoring 02/18/2018 05/06/2018 05/19/2018 05/26/2018 05/26/2018   Height 193 cm 193 cm 193 cm - 193 cm   Height Method Stated Stated Stated - -   Weight 141.522 kg 140.615 kg 136.1 kg 135 kg 135.5 kg   Weight Method Actual Stated - Actual Bed Scale   BMI (calculated) 38.1 kg/m2 37.8 kg/m2 36.6 kg/m2 - 36.4 kg/m2         Intake/Output Summary (Last 24 hours) at 05/27/18 1610  Last data filed at 05/27/18 0400   Gross per 24 hour   Intake  0 ml   Output              700 ml   Net             -700 ml     Body mass index is 36.36 kg/m.   Meds:   Current Facility-Administered Medications   Medication Dose Route Frequency   . amLODIPine  10 mg Oral Daily   . carvedilol  6.25 mg Oral BID Meals   . glipiZIDE  10 mg Oral BID AC   . losartan  50 mg Oral Daily   . sodium chloride (PF)  3 mL Intravenous Q8H   . tiZANidine  4 mg Oral BID   . traMADol  50 mg Oral 4 times per day       PRN Meds: hydrALAZINE, HYDROmorphone, morphine, naloxone, ondansetron, oxyCODONE, oxyCODONE.   LABS:   Estimated Creatinine Clearance: 97.2 mL/min (based on SCr of 1.2 mg/dL).    Recent Labs  Lab 05/27/18  0231 05/26/18  2128   WBC 9.2 12.4*   RBC 2.87* 3.04*   Hemoglobin 10.1* 10.7*   Hematocrit 30.4* 32.1*   MCV 106* 106*   PLT CT 393 416             Lab Results   Component Value Date    HGBA1CPERCNT 5.5 05/19/2018       Recent Labs  Lab 05/27/18  0231 05/26/18  2128   Glucose 224* 179*   Sodium 137 135*   Potassium 3.9 4.1   Chloride 99 98   CO2 25 26.7   BUN 29* 30*   Creatinine 1.20 1.22   EGFR 65 64   Calcium 9.3 9.7               Invalid input(s):  AMORPHOUSUA   Patient Lines/Drains/Airways Status    Active PICC Line / CVC Line / PIV Line / Drain / Airway /  Intraosseous Line / Epidural Line / ART Line / Line / Wound / Pressure Ulcer / NG/OG Tube     Name:   Placement date:   Placement time:   Site:   Days:    Peripheral IV 05/26/18 Right Antecubital  05/26/18    2130    Antecubital    less than 1    Incision Site 05/26/18 Hip Right  05/26/18    2300      less than 1               Xr Hip Right 2-3 Vw Without Pelvis    Result Date: 05/26/2018  Acute fracture involving the mid femur adjacent to the distal tip of the femoral prosthesis. ReadingStation:[id number]    Xr Knee Right 4+ Views    Result Date: 05/26/2018  1.  No acute fracture is identified.  ReadingStation:[id number]    US Venous Leg Right    Result Date: 05/26/2018  No acute deep vein thrombosis is identified. ReadingStation:[id number]     Time spent:    Nutrition assessment done in collaboration with Registered Dietitians:        Alease Medina, MD     05/27/18,8:32 AM   MRN: [id number]                                      CSN: [id number] DOB: 06/17/1956

## 2018-05-27 NOTE — Progress Notes (Addendum)
Readmission Risk  [facility] - STEPDOWN UNIT   Patient Name: [name]   Attending Physician: Alease Medina, MD   Today's date:   05/27/2018 LOS: 1 days   Expected Discharge Date      Readmission Assessment:                                                              Discharge Planning  ReAdmit Risk Score: 11  Does the patient have perscription coverage?: Yes  Utilize [facility]: Yes  Confirmed PCP with Pt: Yes  Confirm Transport to F/U Appt.: Self/Private Vehicle/Friend  Social Work Referral: Not Applicable  Anticipated Home Health at [place]: Yes (Referral not sent at this time. )  Anticipated Placement at [place]:  (Pt requesting acute rehab/ understands criteria requirements and qualification needs)  RNCM comments  Readmission r/t Fall/ R femur frx, s/p THR 05/20/2018 at this facility.  Per MDRs, anticipate Ortho sx tomorrow.  Pt requesting acute rehab as dispo.  RNCM discussed PT/OT eval and insurance criteria as well as facility approval.  Pt to call his insurance to discuss coverage/ eligibility.    Advance Directive Readdressed: No  Healthcare Agent's Name: [facility]  Healthcare Agent's Phone Number: [phone]   IDPA:   Patient Type  Within 30 Days of Previous Admission?: Yes  Healthcare Decisions  Interviewed:: Patient  Orientation/Decision Making Abilities of Patient: Alert and Oriented x3, able to make decisions  Advance Directive: Patient has advance directive, copy in chart  Healthcare Agent Appointed: No  Healthcare Agent's Name: [facility]  Healthcare Agent's Phone Number: [phone]  Prior to admission  Prior level of function: Ambulates with assistive device  Type of Residence: Private residence (Staying with friend)  Home Layout: One level, Performs ADL's on one level, Able to live on main level with bedroom/bathroom  Have running water, electricity, heat, etc?: Yes  Living Arrangements: Friends  How do you get to your MD appointments?: Friend  How do you get your  groceries?: Friend  Who fixes your meals?: Self  Who does your laundry?: Friend/ self  Who picks up your prescriptions?: Friend/ self  Dressing: Needs assistance  Grooming: Needs assistance  Feeding: Independent  Bathing: Needs assistance  Toileting: Needs assistance  DME Currently at Home: Single point cane, Front wheel walker  Discharge Planning  Support Systems: Friends/neighbors  Patient expects to be discharged to:: Home  Anticipated [place] plan discussed with:: Same as interviewed  Mode of transportation:: Private car (family member)  Does the patient have perscription coverage?: Yes  Consults/Providers  PT Evaluation Needed: Yes (Comment) (S/p ortho sx intervention)  OT Evalulation Needed: Yes (Comment)  SLP Evaluation Needed: No  Correct PCP listed in Epic?:  (Pt PCP in [place], following with Ortho while in [place])      30 Day Readmission:   Readmission Patient Interview/Contributing Factors  Contributing Factors to Readmission: Other (enter comment) (Fall while transitioning from cane to [facility].)  Was patient readmitted from a facility?: Not readmitted from a facility   Provider Notifications:   None at this time.      Carolan Clines, RN BSN  Nurse Case Manager  [facility]  [phone]

## 2018-05-27 NOTE — Plan of Care (Signed)
Problem: Moderate/High Fall Risk Score >5  Goal: Patient will remain free of falls  Outcome: Progressing   05/27/18 0000   High Risk Falls Interventions (Greater than 13)   VH High Risk (Greater than 13) ALL REQUIRED LOW INTERVENTIONS;ALL REQUIRED MODERATE INTERVENTIONS;RED "HIGH FALL RISK" SIGNAGE;BED ALARM WILL BE ACTIVATED WHEN THE PATEINT IS IN BED WITH SIGNAGE "RESET BED ALARM";A CHAIR PAD ALARM WILL BE USED WHEN PATIENT IS UP SITTING IN A CHAIR;PATIENT IS TO BE SUPERVISED FOR ALL TOILETING ACTIVITIES       Problem: Peripheral Neurovascular Impairment  Goal: Extremity color, movement, sensation are maintained or improved  Outcome: Progressing   05/27/18 0024   Goal/Interventions addressed this shift   Extremity color, movement, sensation are maintained or improved  Increase mobility as tolerated/progressive mobility;Assess and monitor application of corrective devices (cast, brace, splint), check skin integrity;Assess extremity for proper alignment;Teach/review/reinforce ankle pump exercises;VTE Prevention: Administer anticoagulant(s) and/or apply anti-embolism stockings/devices as ordered

## 2018-05-28 ENCOUNTER — Encounter: Payer: Self-pay | Admitting: Registered Nurse

## 2018-05-28 LAB — CBC AND DIFFERENTIAL
Basophils %: 0.4 % (ref 0.0–3.0)
Basophils Absolute: 0 10*3/uL (ref 0.0–0.3)
Eosinophils %: 0.3 % (ref 0.0–7.0)
Eosinophils Absolute: 0 10*3/uL (ref 0.0–0.8)
Hematocrit: 26.7 % — ABNORMAL LOW (ref 39.0–52.5)
Hemoglobin: 8.9 gm/dL — ABNORMAL LOW (ref 13.0–17.5)
Lymphocytes Absolute: 1.2 10*3/uL (ref 0.6–5.1)
Lymphocytes: 14.1 % — ABNORMAL LOW (ref 15.0–46.0)
MCH: 36 pg — ABNORMAL HIGH (ref 28–35)
MCHC: 33 gm/dL (ref 32–36)
MCV: 108 fL — ABNORMAL HIGH (ref 80–100)
MPV: 7.5 fL (ref 6.0–10.0)
Monocytes Absolute: 0.8 10*3/uL (ref 0.1–1.7)
Monocytes: 9.3 % (ref 3.0–15.0)
Neutrophils %: 76 % (ref 42.0–78.0)
Neutrophils Absolute: 6.4 10*3/uL (ref 1.7–8.6)
PLT CT: 355 10*3/uL (ref 130–440)
RBC: 2.47 10*6/uL — ABNORMAL LOW (ref 4.00–5.70)
RDW: 13.3 % (ref 11.0–14.0)
WBC: 8.4 10*3/uL (ref 4.0–11.0)

## 2018-05-28 LAB — VH DEXTROSE STICK GLUCOSE
Glucose POCT: 196 mg/dL — ABNORMAL HIGH (ref 71–99)
Glucose POCT: 171 mg/dL — ABNORMAL HIGH (ref 71–99)
Glucose POCT: 218 mg/dL — ABNORMAL HIGH (ref 71–99)
Glucose POCT: 209 mg/dL — ABNORMAL HIGH (ref 71–99)
Glucose POCT: 206 mg/dL — ABNORMAL HIGH (ref 71–99)

## 2018-05-28 LAB — BASIC METABOLIC PANEL
Anion Gap: 12 mMol/L (ref 7.0–18.0)
BUN / Creatinine Ratio: 28.1 Ratio (ref 10.0–30.0)
BUN: 25 mg/dL — ABNORMAL HIGH (ref 7–22)
CO2: 26.7 mMol/L (ref 20.0–30.0)
Calcium: 8.8 mg/dL (ref 8.5–10.5)
Chloride: 99 mMol/L (ref 98–110)
Creatinine: 0.89 mg/dL (ref 0.80–1.30)
EGFR: 92 mL/min/{1.73_m2} (ref 60–150)
Glucose: 199 mg/dL — ABNORMAL HIGH (ref 71–99)
Osmolality Calc: 276 mOsm/kg (ref 275–300)
Potassium: 4.7 mMol/L (ref 3.5–5.3)
Sodium: 133 mMol/L — ABNORMAL LOW (ref 136–147)

## 2018-05-28 LAB — TYPE AND SCREEN
AB Screen: NEGATIVE
ABO Rh: A POS

## 2018-05-28 LAB — PT/INR
PT INR: 1.1 (ref 0.5–1.3)
PT: 11.1 s (ref 9.5–11.5)

## 2018-05-28 MED ORDER — SODIUM CHLORIDE 0.9 % IV SOLN
INTRAVENOUS | Status: DC
Start: 2018-05-28 — End: 2018-05-30

## 2018-05-28 MED ORDER — MORPHINE SULFATE 4 MG/ML IJ/IV SOLN (WRAP)
2.0000 mg | Status: DC | PRN
Start: 2018-05-28 — End: 2018-06-03
  Administered 2018-05-29 – 2018-06-02 (×4): 2 mg via INTRAVENOUS
  Filled 2018-05-28 (×4): qty 1

## 2018-05-28 NOTE — Progress Notes (Signed)
[facility] PROGRESS NOTE    Date Time: 05/28/18 7:55 AM  Patient Name: [name],[name]      Assessment:   Periprosthetic hip fracture, no acetabular involvement    Plan:    Weight bearing status: NWB   Precautions: fall   PT/OT: hold   Anticoagulation: hold    Follow up: preop labs  No OR availability until late tonight due to emergencies - on OR schedule for 715 tomorrow AM      Subjective:   Pain controlled with po pain meds    Medications:     Current Facility-Administered Medications   Medication Dose Route Frequency   . amLODIPine  10 mg Oral Daily   . carvedilol  6.25 mg Oral BID Meals   . glipiZIDE  10 mg Oral BID AC   . insulin lispro  2-16 Units Subcutaneous TID AC    And   . insulin lispro  1-9 Units Subcutaneous QHS   . losartan  50 mg Oral Daily   . sodium chloride (PF)  3 mL Intravenous Q8H   . tiZANidine  4 mg Oral BID   . traMADol  50 mg Oral 4 times per day         Physical Exam:     Vitals:    05/28/18 0725   BP: 130/73   Pulse: 82   Resp: 19   Temp: 98.2 F (36.8 C)   SpO2: 93%       Intake and Output Summary (Last 24 hours) at Date Time    Intake/Output Summary (Last 24 hours) at 05/28/18 0755  Last data filed at 05/28/18 6578   Gross per 24 hour   Intake          1001.67 ml   Output              600 ml   Net           401.67 ml         Neurological -  motor and sensory grossly normal  Musculoskeletal - SILT s/s/ta/dp/sp, toes wawp, +df/pf toes and ankle  + ecchymosis and edema    Labs:     Results     Procedure Component Value Units Date/Time    Dextrose Stick Glucose [[id number]]  (Abnormal) Collected:  05/28/18 0726    Specimen:  Blood Updated:  05/28/18 0746     Glucose, POCT 171 (H) mg/dL     Dextrose Stick Glucose [[id number]]  (Abnormal) Collected:  05/28/18 0253    Specimen:  Blood Updated:  05/28/18 0309     Glucose, POCT 196 (H) mg/dL     Dextrose Stick Glucose [[id number]]  (Abnormal) Collected:  05/27/18 2038    Specimen:  Blood Updated:  05/27/18 2055      Glucose, POCT 192 (H) mg/dL     Dextrose Stick Glucose [[id number]]  (Abnormal) Collected:  05/27/18 1808    Specimen:  Blood Updated:  05/27/18 1826     Glucose, POCT 224 (H) mg/dL     Dextrose Stick Glucose [[id number]]  (Abnormal) Collected:  05/27/18 1707    Specimen:  Blood Updated:  05/27/18 1731     Glucose, POCT 192 (H) mg/dL     Dextrose Stick Glucose [[id number]]  (Abnormal) Collected:  05/27/18 1332    Specimen:  Blood Updated:  05/27/18 1350     Glucose, POCT 246 (H) mg/dL           Rads:   Ct Hip Right  Wo Contrast    Result Date: 05/27/2018  Postoperative change of right total hip replacement with right femur fracture involving the greater trochanteric region extending into the proximal and mid diaphyseal region of the right femur with lateral displacement of the fracture fragment distal to the shaft of the femoral prosthesis. Acetabular component appears in place with no acetabular fracture identified. ReadingStation:[id number]      Signed by: Len Childs, MD

## 2018-05-28 NOTE — Progress Notes (Signed)
05/28/18 1009   Case Management Quick Doc   CM Comments 06/26  RNCM  Readmission r/t Fall/ R femur frx, s/p THR 05/20/2018 at this facility. Per electronic chart review, anticipate Ortho sx tomorrow vs acute transfer to higher level of care. Pt requesting acute rehab as dispo. RNCM discussed PT/OT eval and insurance criteria as well as facility approval. Pt to call his insurance to discuss coverage/ eligibility.  Dispo TBD pending clinical progress.      Carolan Clines, RN BSN  Nurse Case Manager  [facility]  [phone]

## 2018-05-28 NOTE — Plan of Care (Signed)
Problem: Compromised Tissue integrity  Goal: Damaged tissue is healing and protected  Outcome: Progressing   05/27/18 1259   Goal/Interventions addressed this shift   Damaged tissue is healing and protected  Provide wound care per wound care algorithm;Reposition patient every 2 hours and as needed unless able to reposition self;Increase activity as tolerated/progressive mobility;Relieve pressure to bony prominences for patients at moderate and high risk;Keep intact skin clean and dry;Use bath wipes, not soap and water, for daily bathing       Problem: Moderate/High Fall Risk Score >5  Goal: Patient will remain free of falls  Outcome: Progressing   05/28/18 0826   High Risk Falls Interventions (Greater than 13)   VH High Risk (Greater than 13) RED "HIGH FALL RISK" SIGNAGE;BED ALARM WILL BE ACTIVATED WHEN THE PATEINT IS IN BED WITH SIGNAGE "RESET BED ALARM";PATIENT IS TO BE SUPERVISED FOR ALL TOILETING ACTIVITIES;Keep door open for better visibility;Request PT/OT therapy consult order from physician for patients with gait/mobility impairment;Use assistive devices       Problem: Pain interferes with ability to perform ADL  Goal: Pain at adequate level as identified by patient  Outcome: Progressing   05/27/18 1259   Goal/Interventions addressed this shift   Pain at adequate level as identified by patient Identify patient comfort function goal;Assess for risk of opioid induced respiratory depression, including snoring/sleep apnea. Alert healthcare team of risk factors identified.;Assess pain on admission, during daily assessment and/or before any "as needed" intervention(s);Reassess pain within 30-60 minutes of any procedure/intervention, per Pain Assessment, Intervention, Reassessment (AIR) Cycle;Evaluate if patient comfort function goal is met;Evaluate patient's satisfaction with pain management progress;Include patient/patient care companion in decisions related to pain management as needed;Consult/collaborate with  Physical Therapy, Occupational Therapy, and/or Speech Therapy       Problem: Side Effects from Pain Analgesia  Goal: Patient will experience minimal side effects of analgesic therapy  Outcome: Progressing   05/28/18 0827   Goal/Interventions addressed this shift   Patient will experience minimal side effects of analgesic therapy Monitor/assess patient's respiratory status (RR depth, effort, breath sounds);Assess for changes in cognitive function;Evaluate for opioid-induced sedation with appropriate assessment tool (i.e. POSS)

## 2018-05-28 NOTE — Progress Notes (Signed)
NURSE NOTE SUMMARY  [facility] - STEPDOWN UNIT   Patient Name: [name]   Attending Physician: Alease Medina, MD   Today's date:   05/28/2018 LOS: 2 days   Shift Summary:                                                              Patient A&O x4. VS stable. RLE swollen. Pulses palpable. Ice packs provided and PRN pain medications given. NPO at midnight for surgery. IVF started per order.    Provider Notifications:      Rapid Response Notifications:  Mobility:      PMP Activity: Step 3 - Bed Mobility (05/27/2018  8:42 PM)   Weight tracking:  Family Dynamic:   Last 3 Weights for the past 72 hrs (Last 3 readings):   Weight   05/26/18 2238 135.5 kg (298 lb 11.6 oz)   05/26/18 1902 135 kg (297 lb 9.9 oz)        Healthcare Agent's Name: [facility] [employer]  Healthcare Agent's Phone Number: [phone]   Recent Vitals Last Bowel Movement   BP 118/66   Pulse 67   Temp 98.1 F (36.7 C) (Oral)   Resp 17   Ht 1.93 m (6\' 4" )   Wt 135.5 kg (298 lb 11.6 oz)   SpO2 93%   BMI 36.36 kg/m  Last BM Date: 05/18/18

## 2018-05-28 NOTE — Progress Notes (Signed)
Medicine Progress Note - [facility]  [employer]   Patient Name: [name], [name] LOS: 2 days   Attending Physician: Alease Medina, MD PCP: Md, Out Of Network      Assessment and Plan:                                                                # Femur fracture, right periprosthetic  No evidence of acetabular abnormality on CT scan   Dr Emeline Darling will take him to OR for repair-no rooming in OR available today  Anticoagulation has been held since 48 hrs     # Osteoarthritis of right hip   s/p right hip replacement per Dr Emeline Darling    #  CAD (coronary artery disease)  No chest pain  Continue beta-blockers    #  AF (atrial fibrillation)  Rate ok with BB  off eliquis for possible surgery    #  DM (diabetes mellitus)  Last a1c WNL  Continue Glucotrol - hold a.m. dose tomorrow if going to surgery  Per sliding scale     # HTN (hypertension)  Resume home meds  Add prn hydralazine    Disposition: SNF  DVT PPX:  SCDs  Code:  Full Code   Hospital Course   [name] is a 62 y.o. male who presents to the hospital with  Fall and leg pain, found to have fracture  He has h/o AF/flutter on eliquis, CAD s/p CABG, DM, HTN, had scheduled right hip arthroplasty on 6-17 for chronic arthritis, hen d/c , he was at home and today he stood up and his right knee gave up and he fell down, landed on his right side, then had significant right leg pain. Came to ER, here XR showed femur fracture below prosthesis level       EMR Generated Hospital Problem List      Subjective     Doing well overall  Pain over fracture area stable  Surgery canceled due to emergencies today      Objective   Physical Exam:     Vitals: T:98.2 F (36.8 C) (Oral), BP:130/73, HR:82, RR:19, SaO2:93%    General: Patient is awake. In no acute distress.  HEENT: PERRL, EOMI, no conjunctival drainage, vision is intact.  Neck: supple, no thyromegaly.  Chest: CTA bilaterally. No rhonchi, no wheezing. No use of accessory muscles.  CVS: normal  rate and regular rhythm no murmurs, without JVD.  Abdomen: soft, non-tender, no guarding or rigidity, with normal bowel sounds.  Extremities: +1 pitting edema rt lower extremity, pulses palpable, no calf swelling and gross no deformity.  Skin: Warm, dry, no rash and no worrisome lesions.  NEURO: no motor or sensory deficits.  Psychiatric: alert, interactive, appropriate, normal affect.  Weight Monitoring 02/18/2018 05/06/2018 05/19/2018 05/26/2018 05/26/2018   Height 193 cm 193 cm 193 cm - 193 cm   Height Method Stated Stated Stated - -   Weight 141.522 kg 140.615 kg 136.1 kg 135 kg 135.5 kg   Weight Method Actual Stated - Actual Bed Scale   BMI (calculated) 38.1 kg/m2 37.8 kg/m2 36.6 kg/m2 - 36.4 kg/m2         Intake/Output Summary (Last 24 hours) at 05/28/18 0759  Last data filed at 05/28/18 [phone]  Gross per 24 hour   Intake          1001.67 ml   Output              600 ml   Net           401.67 ml     Body mass index is 36.36 kg/m.   Meds:   Current Facility-Administered Medications   Medication Dose Route Frequency   . amLODIPine  10 mg Oral Daily   . carvedilol  6.25 mg Oral BID Meals   . glipiZIDE  10 mg Oral BID AC   . insulin lispro  2-16 Units Subcutaneous TID AC    And   . insulin lispro  1-9 Units Subcutaneous QHS   . losartan  50 mg Oral Daily   . sodium chloride (PF)  3 mL Intravenous Q8H   . tiZANidine  4 mg Oral BID   . traMADol  50 mg Oral 4 times per [name]     . sodium chloride 100 mL/hr at 05/28/18 0048     PRN Meds: dextrose, glucagon (rDNA), hydrALAZINE, HYDROmorphone, NSG Communication: Glucose POCT order (AC, HS) **AND** insulin lispro **AND** insulin lispro **AND** insulin lispro, morphine, naloxone, ondansetron, oxyCODONE, oxyCODONE.   LABS:   Estimated Creatinine Clearance: 97.2 mL/min (based on SCr of 1.2 mg/dL).    Recent Labs  Lab 05/27/18  0231 05/26/18  2128   WBC 9.2 12.4*   RBC 2.87* 3.04*   Hemoglobin 10.1* 10.7*   Hematocrit 30.4* 32.1*   MCV 106* 106*   PLT CT 393 416             Lab  Results   Component Value Date    HGBA1CPERCNT 5.5 05/19/2018       Recent Labs  Lab 05/27/18  0231 05/26/18  2128   Glucose 224* 179*   Sodium 137 135*   Potassium 3.9 4.1   Chloride 99 98   CO2 25 26.7   BUN 29* 30*   Creatinine 1.20 1.22   EGFR 65 64   Calcium 9.3 9.7               Invalid input(s):  AMORPHOUSUA   Patient Lines/Drains/Airways Status    Active PICC Line / CVC Line / PIV Line / Drain / Airway / Intraosseous Line / Epidural Line / ART Line / Line / Wound / Pressure Ulcer / NG/OG Tube     Name:   Placement date:   Placement time:   Site:   Days:    Peripheral IV 05/26/18 Right Antecubital  05/26/18    2130    Antecubital    less than 1    Incision Site 05/26/18 Hip Right  05/26/18    2300      less than 1               Xr Hip Right 2-3 Vw Without Pelvis    Result Date: 05/26/2018  Acute fracture involving the mid femur adjacent to the distal tip of the femoral prosthesis. ReadingStation:[id number]    Xr Knee Right 4+ Views    Result Date: 05/26/2018  1.  No acute fracture is identified.  ReadingStation:[id number]    Ct Hip Right Wo Contrast    Result Date: 05/27/2018  Postoperative change of right total hip replacement with right femur fracture involving the greater trochanteric region extending into the proximal and mid diaphyseal region of the right femur with lateral displacement of the  fracture fragment distal to the shaft of the femoral prosthesis. Acetabular component appears in place with no acetabular fracture identified. ReadingStation:[id number]    US Venous Leg Right    Result Date: 05/26/2018  No acute deep vein thrombosis is identified. ReadingStation:[id number]     Time spent:    Nutrition assessment done in collaboration with Registered Dietitians:        Alease Medina, MD     05/28/18,7:59 AM   MRN: [id number]                                      CSN: [id number] DOB: 07-15-1956

## 2018-05-28 NOTE — Progress Notes (Signed)
NURSE NOTE SUMMARY  [facility] - STEPDOWN UNIT   Patient Name: [name]   Attending Physician: Alease Medina, MD   Today's date:   05/28/2018 LOS: 2 days   Shift Summary:                                                              Patient A&O x4. VS stable. RLE swollen. Pulses palpable. Ice packs provided and PRN pain medications given. Zofran given prior to IV dilaudid per patient's request. Even with Zofran he felt nauseous and had emesis x1.  NPO at midnight for surgery. IVF started per order.    Provider Notifications:      Rapid Response Notifications:  Mobility:      PMP Activity: Step 3 - Bed Mobility (05/27/2018  8:42 PM)   Weight tracking:  Family Dynamic:   Last 3 Weights for the past 72 hrs (Last 3 readings):   Weight   05/26/18 2238 135.5 kg (298 lb 11.6 oz)   05/26/18 1902 135 kg (297 lb 9.9 oz)        Healthcare Agent's Name: [facility]  Healthcare Agent's Phone Number: [phone]   Recent Vitals Last Bowel Movement   BP 118/66   Pulse 67   Temp 98.1 F (36.7 C) (Oral)   Resp 17   Ht 1.93 m (6\' 4" )   Wt 135.5 kg (298 lb 11.6 oz)   SpO2 93%   BMI 36.36 kg/m  Last BM Date: 05/18/18

## 2018-05-29 ENCOUNTER — Encounter: Payer: Self-pay | Admitting: Anesthesiology

## 2018-05-29 ENCOUNTER — Inpatient Hospital Stay: Payer: BC Managed Care – PPO

## 2018-05-29 ENCOUNTER — Inpatient Hospital Stay: Payer: BC Managed Care – PPO | Admitting: Registered Nurse

## 2018-05-29 ENCOUNTER — Encounter
Admission: EM | Disposition: A | Discharge status: Rehabilitation Facility | Payer: Self-pay | Source: Home / Self Care | Attending: Hospice and Palliative Medicine

## 2018-05-29 LAB — VH I-STAT CG8 PLUS
BE, ISTAT: -9 mMol/L — ABNORMAL LOW (ref <=2)
BE, ISTAT: 0 mMol/L (ref <=2)
Calcium Ionized I-Stat: 4.4 mg/dL (ref 4.35–5.10)
Calcium Ionized I-Stat: 4.5 mg/dL (ref 4.35–5.10)
Glucose I-Stat: 129 mg/dL — ABNORMAL HIGH (ref 71–99)
Glucose I-Stat: 199 mg/dL — ABNORMAL HIGH (ref 71–99)
HCO3, ISTAT: 16.1 mMol/L — ABNORMAL LOW (ref 20.0–29.0)
HCO3, ISTAT: 25.6 mMol/L (ref 20.0–29.0)
Hematocrit I-Stat: 22 % — ABNORMAL LOW (ref 39.0–52.5)
Hematocrit I-Stat: <15 % — CL (ref 39.0–52.5)
Hemoglobin I-Stat: 7.5 gm/dL — ABNORMAL LOW (ref 13.0–17.5)
O2 Sat, %, ISTAT: 75 % — ABNORMAL LOW (ref 96–100)
O2 Sat, %, ISTAT: 80 % — ABNORMAL LOW (ref 96–100)
PCO2, ISTAT: 30.5 mm Hg — ABNORMAL LOW (ref 35.0–45.0)
PCO2, ISTAT: 44.3 mm Hg (ref 35.0–45.0)
PO2, ISTAT: 42 mm Hg — CL (ref 75–100)
PO2, ISTAT: 46 mm Hg — CL (ref 75–100)
Potassium I-Stat: 4.8 mMol/L (ref 3.5–5.3)
Potassium I-Stat: 5.4 mMol/L — ABNORMAL HIGH (ref 3.5–5.3)
Sodium I-Stat: 132 mMol/L — ABNORMAL LOW (ref 136–147)
Sodium I-Stat: 132 mMol/L — ABNORMAL LOW (ref 136–147)
TCO2 I-Stat: 17 mMol/L — ABNORMAL LOW (ref 24–29)
TCO2 I-Stat: 27 mMol/L (ref 24–29)
i-STAT FIO2: 0.1 %
i-STAT FIO2: 100 %
pH, ISTAT: 7.33 — ABNORMAL LOW (ref 7.35–7.45)
pH, ISTAT: 7.37 (ref 7.35–7.45)

## 2018-05-29 LAB — VH DEXTROSE STICK GLUCOSE
Glucose POCT: 189 mg/dL — ABNORMAL HIGH (ref 71–99)
Glucose POCT: 250 mg/dL — ABNORMAL HIGH (ref 71–99)
Glucose POCT: 258 mg/dL — ABNORMAL HIGH (ref 71–99)
Glucose POCT: 291 mg/dL — ABNORMAL HIGH (ref 71–99)
Glucose POCT: 285 mg/dL — ABNORMAL HIGH (ref 71–99)

## 2018-05-29 SURGERY — ARTHROPLASTY, HIP, TOTAL, REVISION, ANTERIOR APPROACH, C ARM
Anesthesia: Anesthesia General | Site: Hip | Laterality: Right | Wound class: Clean

## 2018-05-29 MED ORDER — CEFAZOLIN SODIUM 1 G IJ SOLR
2.0000 g | Freq: Three times a day (TID) | INTRAMUSCULAR | Status: AC
Start: 2018-05-29 — End: 2018-05-30
  Administered 2018-05-29 – 2018-05-30 (×2): 2 g via INTRAVENOUS
  Filled 2018-05-29: qty 20
  Filled 2018-05-29: qty 2000
  Filled 2018-05-29: qty 20
  Filled 2018-05-29: qty 2000

## 2018-05-29 MED ORDER — MORPHINE SULFATE (PF) 0.5 MG/ML IJ SOLN
INTRAMUSCULAR | Status: AC
Start: 2018-05-29 — End: ?
  Filled 2018-05-29: qty 10

## 2018-05-29 MED ORDER — LIDOCAINE HCL (PF) 2 % IJ SOLN
INTRAMUSCULAR | Status: DC | PRN
Start: 2018-05-29 — End: 2018-05-29
  Administered 2018-05-29: 100 mg via INTRAVENOUS

## 2018-05-29 MED ORDER — VH HYDROMORPHONE HCL PF 1 MG/ML CARPUJECT
0.5000 mg | INTRAMUSCULAR | Status: DC | PRN
Start: 2018-05-29 — End: 2018-05-29

## 2018-05-29 MED ORDER — VH PHENYLEPHRINE 30 MG IN NS 250 ML INFUSION (SIMPLE)
INTRAVENOUS | Status: AC
Start: 2018-05-29 — End: ?
  Filled 2018-05-29: qty 250

## 2018-05-29 MED ORDER — PROPOFOL 200 MG/20ML IV EMUL
INTRAVENOUS | Status: AC
Start: 2018-05-29 — End: ?
  Filled 2018-05-29: qty 20

## 2018-05-29 MED ORDER — ONDANSETRON HCL 4 MG/2ML IJ SOLN
INTRAMUSCULAR | Status: AC
Start: 2018-05-29 — End: ?
  Filled 2018-05-29: qty 2

## 2018-05-29 MED ORDER — METOPROLOL TARTRATE 5 MG/5ML IV SOLN
INTRAVENOUS | Status: AC
Start: 2018-05-29 — End: ?
  Filled 2018-05-29: qty 5

## 2018-05-29 MED ORDER — ONDANSETRON HCL 4 MG/2ML IJ SOLN
INTRAMUSCULAR | Status: DC | PRN
Start: 2018-05-29 — End: 2018-05-29
  Administered 2018-05-29: 4 mg via INTRAVENOUS

## 2018-05-29 MED ORDER — VH CEFAZOLIN 3 G IN NS 100 ML IVPB (SIMPLE)
3.00 g | INTRAVENOUS | Status: AC
Start: 2018-05-29 — End: 2018-05-29
  Administered 2018-05-29: 08:00:00 3 g via INTRAVENOUS

## 2018-05-29 MED ORDER — MORPHINE SULFATE (PF) 0.5 MG/ML IJ SOLN
INTRAMUSCULAR | Status: DC | PRN
Start: 2018-05-29 — End: 2018-05-29
  Administered 2018-05-29: 5 mg via INTRAMUSCULAR

## 2018-05-29 MED ORDER — FENTANYL CITRATE (PF) 50 MCG/ML IJ SOLN (WRAP)
INTRAMUSCULAR | Status: AC
Start: 2018-05-29 — End: ?
  Filled 2018-05-29: qty 5

## 2018-05-29 MED ORDER — PROPOFOL 200 MG/20ML IV EMUL
INTRAVENOUS | Status: DC | PRN
Start: 2018-05-29 — End: 2018-05-29
  Administered 2018-05-29: 200 mg via INTRAVENOUS

## 2018-05-29 MED ORDER — ACETAMINOPHEN 325 MG PO TABS
650.0000 mg | ORAL_TABLET | ORAL | Status: DC
Start: 2018-05-29 — End: 2018-06-03
  Administered 2018-05-29 – 2018-06-03 (×28): 650 mg via ORAL
  Filled 2018-05-29 (×37): qty 2

## 2018-05-29 MED ORDER — ALBUMIN HUMAN 5 % IV SOLN
INTRAVENOUS | Status: DC | PRN
Start: 2018-05-29 — End: 2018-05-29

## 2018-05-29 MED ORDER — LIDOCAINE HCL (PF) 2 % IJ SOLN
INTRAMUSCULAR | Status: AC
Start: 2018-05-29 — End: ?
  Filled 2018-05-29: qty 5

## 2018-05-29 MED ORDER — KETOROLAC TROMETHAMINE 30 MG/ML IJ SOLN
Freq: Once | INTRAMUSCULAR | Status: DC
Start: 2018-05-29 — End: 2018-05-29
  Filled 2018-05-29: qty 0.25

## 2018-05-29 MED ORDER — PROMETHAZINE HCL 12.5 MG RE SUPP
12.5000 mg | Freq: Four times a day (QID) | RECTAL | Status: DC | PRN
Start: 2018-05-29 — End: 2018-06-03

## 2018-05-29 MED ORDER — OXYCODONE HCL 5 MG PO TABS
5.0000 mg | ORAL_TABLET | Freq: Once | ORAL | Status: DC | PRN
Start: 2018-05-29 — End: 2018-05-29
  Filled 2018-05-29: qty 1

## 2018-05-29 MED ORDER — DIPHENHYDRAMINE HCL 25 MG PO CAPS
25.0000 mg | ORAL_CAPSULE | Freq: Every evening | ORAL | Status: DC | PRN
Start: 2018-05-29 — End: 2018-06-03

## 2018-05-29 MED ORDER — LACTATED RINGERS IV SOLN
INTRAVENOUS | Status: DC
Start: 2018-05-29 — End: 2018-05-30

## 2018-05-29 MED ORDER — FENTANYL CITRATE (PF) 50 MCG/ML IJ SOLN (WRAP)
50.0000 ug | INTRAMUSCULAR | Status: DC | PRN
Start: 2018-05-29 — End: 2018-05-29

## 2018-05-29 MED ORDER — NALOXONE HCL 0.4 MG/ML IJ SOLN (WRAP)
0.4000 mg | INTRAMUSCULAR | Status: DC | PRN
Start: 2018-05-29 — End: 2018-06-03

## 2018-05-29 MED ORDER — METFORMIN HCL 500 MG PO TABS
500.00 mg | ORAL_TABLET | Freq: Two times a day (BID) | ORAL | Status: DC
Start: 2018-05-29 — End: 2018-06-03
  Administered 2018-05-29 – 2018-06-03 (×11): 500 mg via ORAL
  Filled 2018-05-29 (×11): qty 1

## 2018-05-29 MED ORDER — HALOPERIDOL LACTATE 5 MG/ML IJ SOLN
1.0000 mg | Freq: Once | INTRAMUSCULAR | Status: DC | PRN
Start: 2018-05-29 — End: 2018-05-29
  Filled 2018-05-29: qty 1

## 2018-05-29 MED ORDER — PROMETHAZINE HCL 25 MG PO TABS
25.0000 mg | ORAL_TABLET | Freq: Four times a day (QID) | ORAL | Status: DC | PRN
Start: 2018-05-29 — End: 2018-06-03

## 2018-05-29 MED ORDER — VH EPINEPHRINE HCL 0.1MG/ML (DILUTION FOR CAMERON COCKTAIL)
INTRAMUSCULAR | Status: DC | PRN
Start: 2018-05-29 — End: 2018-05-29
  Administered 2018-05-29: 09:00:00 81.25 mL via INTRA_ARTICULAR

## 2018-05-29 MED ORDER — DIPHENHYDRAMINE HCL 50 MG/ML IJ SOLN
25.0000 mg | Freq: Four times a day (QID) | INTRAMUSCULAR | Status: DC | PRN
Start: 2018-05-29 — End: 2018-05-29

## 2018-05-29 MED ORDER — SITAGLIPTIN PHOSPHATE 50 MG PO TABS
50.00 mg | ORAL_TABLET | Freq: Two times a day (BID) | ORAL | Status: DC
Start: 2018-05-29 — End: 2018-06-03
  Administered 2018-05-29 – 2018-06-03 (×11): 50 mg via ORAL
  Filled 2018-05-29 (×11): qty 1

## 2018-05-29 MED ORDER — MIDAZOLAM HCL 2 MG/2ML IJ SOLN
INTRAMUSCULAR | Status: AC
Start: 2018-05-29 — End: ?
  Filled 2018-05-29: qty 2

## 2018-05-29 MED ORDER — FENTANYL CITRATE (PF) 50 MCG/ML IJ SOLN (WRAP)
INTRAMUSCULAR | Status: DC | PRN
Start: 2018-05-29 — End: 2018-05-29
  Administered 2018-05-29 (×2): 100 ug via INTRAVENOUS
  Administered 2018-05-29: 50 ug via INTRAVENOUS
  Administered 2018-05-29: 100 ug via INTRAVENOUS

## 2018-05-29 MED ORDER — POLYETHYLENE GLYCOL 3350 17 G PO PACK
17.00 g | PACK | Freq: Two times a day (BID) | ORAL | Status: DC
Start: 2018-05-29 — End: 2018-06-03
  Administered 2018-05-29 – 2018-06-03 (×7): 17 g via ORAL
  Filled 2018-05-29 (×11): qty 1

## 2018-05-29 MED ORDER — ONDANSETRON HCL 4 MG/2ML IJ SOLN
4.0000 mg | Freq: Once | INTRAMUSCULAR | Status: AC | PRN
Start: 2018-05-29 — End: 2018-05-29
  Administered 2018-05-29: 13:00:00 4 mg via INTRAVENOUS
  Filled 2018-05-29: qty 2

## 2018-05-29 MED ORDER — METOPROLOL TARTRATE 5 MG/5ML IV SOLN
INTRAVENOUS | Status: DC | PRN
Start: 2018-05-29 — End: 2018-05-29
  Administered 2018-05-29: 2.5 mg via INTRAVENOUS

## 2018-05-29 MED ORDER — OXYCODONE HCL 5 MG PO TABS
5.0000 mg | ORAL_TABLET | Freq: Once | ORAL | Status: DC | PRN
Start: 2018-05-29 — End: 2018-05-29

## 2018-05-29 MED ORDER — FENTANYL CITRATE (PF) 50 MCG/ML IJ SOLN (WRAP)
25.0000 ug | INTRAMUSCULAR | Status: DC | PRN
Start: 2018-05-29 — End: 2018-05-29
  Filled 2018-05-29: qty 2

## 2018-05-29 MED ORDER — LACTATED RINGERS IV SOLN
INTRAVENOUS | Status: DC | PRN
Start: 2018-05-29 — End: 2018-05-29

## 2018-05-29 MED ORDER — APIXABAN 5 MG PO TABS
5.00 mg | ORAL_TABLET | Freq: Two times a day (BID) | ORAL | Status: DC
Start: 2018-05-30 — End: 2018-06-03
  Administered 2018-05-30 – 2018-06-03 (×9): 5 mg via ORAL
  Filled 2018-05-29 (×10): qty 1

## 2018-05-29 MED ORDER — VH PHENYLEPHRINE 120 MCG/ML IV BOLUS (ANESTHESIA)
PREFILLED_SYRINGE | INTRAVENOUS | Status: AC
Start: 2018-05-29 — End: ?
  Filled 2018-05-29: qty 40

## 2018-05-29 MED ORDER — BISACODYL 5 MG PO TBEC
5.0000 mg | DELAYED_RELEASE_TABLET | Freq: Every day | ORAL | Status: DC
Start: 2018-05-31 — End: 2018-06-03
  Administered 2018-05-31 – 2018-06-01 (×2): 5 mg via ORAL
  Filled 2018-05-29 (×4): qty 1

## 2018-05-29 MED ORDER — VH PHENYLEPHRINE 30 MG IN NS 250 ML INFUSION (SIMPLE)
INTRAVENOUS | Status: DC | PRN
Start: 2018-05-29 — End: 2018-05-29
  Administered 2018-05-29: 30 ug/min via INTRAVENOUS

## 2018-05-29 MED ORDER — ACETAMINOPHEN 10 MG/ML IV SOLN
INTRAVENOUS | Status: AC
Start: 2018-05-29 — End: ?
  Filled 2018-05-29: qty 100

## 2018-05-29 MED ORDER — ACETAMINOPHEN 10 MG/ML IV SOLN
INTRAVENOUS | Status: DC | PRN
Start: 2018-05-29 — End: 2018-05-29
  Administered 2018-05-29: 1000 mg via INTRAVENOUS

## 2018-05-29 MED ORDER — SUGAMMADEX SODIUM 200 MG/2ML IV SOLN
INTRAVENOUS | Status: DC | PRN
Start: 2018-05-29 — End: 2018-05-29
  Administered 2018-05-29: 300 mg via INTRAVENOUS

## 2018-05-29 MED ORDER — DEXAMETHASONE SODIUM PHOSPHATE 4 MG/ML IJ SOLN
INTRAMUSCULAR | Status: AC
Start: 2018-05-29 — End: ?
  Filled 2018-05-29: qty 1

## 2018-05-29 MED ORDER — VALLEY PROMETHAZINE 50 MG/0.4 ML TOPICAL GEL UD (RPKG)
12.5000 mg | Freq: Once | TOPICAL | Status: AC | PRN
Start: 2018-05-29 — End: 2018-05-29
  Administered 2018-05-29: 13:00:00 12.5 mg via TOPICAL
  Filled 2018-05-29: qty 0.4

## 2018-05-29 MED ORDER — DEXAMETHASONE SODIUM PHOSPHATE 4 MG/ML IJ SOLN
INTRAMUSCULAR | Status: DC | PRN
Start: 2018-05-29 — End: 2018-05-29
  Administered 2018-05-29: 4 mg via INTRAVENOUS

## 2018-05-29 MED ORDER — ALBUMIN HUMAN 5 % IV SOLN
INTRAVENOUS | Status: AC
Start: 2018-05-29 — End: ?
  Filled 2018-05-29: qty 250

## 2018-05-29 MED ORDER — ONDANSETRON 4 MG PO TBDP
4.0000 mg | ORAL_TABLET | Freq: Three times a day (TID) | ORAL | Status: DC | PRN
Start: 2018-05-29 — End: 2018-06-03

## 2018-05-29 MED ORDER — ONDANSETRON HCL 4 MG/2ML IJ SOLN
4.0000 mg | Freq: Three times a day (TID) | INTRAMUSCULAR | Status: DC | PRN
Start: 2018-05-29 — End: 2018-06-03

## 2018-05-29 MED ORDER — DIPHENHYDRAMINE HCL 25 MG PO CAPS
25.0000 mg | ORAL_CAPSULE | Freq: Two times a day (BID) | ORAL | Status: DC | PRN
Start: 2018-05-29 — End: 2018-06-03

## 2018-05-29 MED ORDER — ROCURONIUM BROMIDE 50 MG/5ML IV SOLN
INTRAVENOUS | Status: AC
Start: 2018-05-29 — End: ?
  Filled 2018-05-29: qty 5

## 2018-05-29 MED ORDER — EPHEDRINE SULFATE 50 MG/ML IJ/IV SOLN (WRAP)
10.0000 mg | Status: DC | PRN
Start: 2018-05-29 — End: 2018-05-29

## 2018-05-29 MED ORDER — VH PROMETHAZINE HCL 25 MG/ML IM SOLN
6.2500 mg | Freq: Four times a day (QID) | INTRAMUSCULAR | Status: DC | PRN
Start: 2018-05-29 — End: 2018-06-03

## 2018-05-29 MED ORDER — ACETAMINOPHEN 325 MG PO TABS
650.0000 mg | ORAL_TABLET | ORAL | Status: DC | PRN
Start: 2018-05-29 — End: 2018-06-03

## 2018-05-29 MED ORDER — SUGAMMADEX SODIUM 200 MG/2ML IV SOLN
INTRAVENOUS | Status: AC
Start: 2018-05-29 — End: ?
  Filled 2018-05-29: qty 2

## 2018-05-29 MED ORDER — VH PHENYLEPHRINE 120 MCG/ML IV BOLUS (ANESTHESIA)
PREFILLED_SYRINGE | INTRAVENOUS | Status: DC | PRN
Start: 2018-05-29 — End: 2018-05-29
  Administered 2018-05-29: 240 ug via INTRAVENOUS
  Administered 2018-05-29 (×5): 120 ug via INTRAVENOUS

## 2018-05-29 MED ORDER — ROCURONIUM BROMIDE 50 MG/5ML IV SOLN
INTRAVENOUS | Status: DC | PRN
Start: 2018-05-29 — End: 2018-05-29
  Administered 2018-05-29: 20 mg via INTRAVENOUS
  Administered 2018-05-29: 10 mg via INTRAVENOUS
  Administered 2018-05-29: 50 mg via INTRAVENOUS
  Administered 2018-05-29: 10 mg via INTRAVENOUS

## 2018-05-29 SURGICAL SUPPLY — 18 items
ADHESIVE DERMABOND HIVIC PEN (Dressings) IMPLANT
BLADE CLIPPER (Supply) IMPLANT
CABLE 2.0 MM ×6 IMPLANT
CABLE SLEEVE MEDIUM 2.0 MM ×6 IMPLANT
CER BIOLOX OPTION HD 36MM ×2 IMPLANT
CONE FEM ARCOS SZ B STD 60MM ×2 IMPLANT
DRAPE BACK TABLE TIERED (Supply) ×2 IMPLANT
DRAPE TOP 102IN X 53IN (Supply) IMPLANT
KIT CLOSED WOUND SUCTION (Supply) IMPLANT
PACK ANTERIOR HIP (Supply) ×2 IMPLANT
PILLOW HIP ABDUCT SM 12X18X6 (Supply) ×2 IMPLANT
ROD REAMING 2.5MM (Supply) ×2 IMPLANT
SEALER BIPOLAR 6.0 (Supply) ×2 IMPLANT
SLEEVE CER OPTION TYPE1 TPR 3 ×2 IMPLANT
SOL SALINE IRRIG 3000ML (Supply) ×4 IMPLANT
STEM FEM ARCOS TPR DIST 16X200 ×2 IMPLANT
SUT MAXBRAID PE #2 TAPER HC-5 (Supply) ×2 IMPLANT
TOWEL BLUE STERILE 6 PER PK (Supply) ×8 IMPLANT

## 2018-05-29 NOTE — Plan of Care (Signed)
Problem: Compromised Tissue integrity  Goal: Damaged tissue is healing and protected  Outcome: Progressing   05/29/18 0213   Goal/Interventions addressed this shift   Damaged tissue is healing and protected  Monitor/assess Braden scale every shift;Increase activity as tolerated/progressive mobility;Keep intact skin clean and dry;Monitor patient's hygiene practices       Problem: Moderate/High Fall Risk Score >5  Goal: Patient will remain free of falls  Outcome: Progressing   05/28/18 2000   High Risk Falls Interventions (Greater than 13)   VH High Risk (Greater than 13) ALL REQUIRED LOW INTERVENTIONS;ALL REQUIRED MODERATE INTERVENTIONS;RED "HIGH FALL RISK" SIGNAGE;BED ALARM WILL BE ACTIVATED WHEN THE PATEINT IS IN BED WITH SIGNAGE "RESET BED ALARM";PATIENT IS TO BE SUPERVISED FOR ALL TOILETING ACTIVITIES       Problem: Pain interferes with ability to perform ADL  Goal: Pain at adequate level as identified by patient  Outcome: Not Progressing   05/29/18 0213   Goal/Interventions addressed this shift   Pain at adequate level as identified by patient Identify patient comfort function goal;Assess for risk of opioid induced respiratory depression, including snoring/sleep apnea. Alert healthcare team of risk factors identified.;Assess pain on admission, during daily assessment and/or before any "as needed" intervention(s);Evaluate if patient comfort function goal is met;Evaluate patient's satisfaction with pain management progress;Offer non-pharmacological pain management interventions       Problem: Side Effects from Pain Analgesia  Goal: Patient will experience minimal side effects of analgesic therapy  Outcome: Progressing   05/29/18 0213   Goal/Interventions addressed this shift   Patient will experience minimal side effects of analgesic therapy Monitor/assess patient's respiratory status (RR depth, effort, breath sounds);Evaluate for opioid-induced sedation with appropriate assessment tool (i.e. POSS)       Problem:  Impaired Mobility  Goal: Mobility/Activity is maintained at optimal level for patient  Outcome: Progressing   05/29/18 0213   Goal/Interventions addressed this shift   Mobility/activity is maintained at optimal level for patient Maintain proper body alignment;Plan activities to conserve energy, plan rest periods       Problem: Peripheral Neurovascular Impairment  Goal: Extremity color, movement, sensation are maintained or improved  Outcome: Progressing   05/29/18 0213   Goal/Interventions addressed this shift   Extremity color, movement, sensation are maintained or improved  Increase mobility as tolerated/progressive mobility;Assess extremity for proper alignment       Problem: Compromised skin integrity  Goal: Skin integrity is maintained or improved   05/29/18 0213   Goal/Interventions addressed this shift   Skin integrity is maintained or improved Assess Braden Scale every shift;Keep skin clean and dry

## 2018-05-29 NOTE — Plan of Care (Signed)
Problem: Moderate/High Fall Risk Score >5  Goal: Patient will remain free of falls  Outcome: Progressing   05/29/18 1400   High Risk Falls Interventions (Greater than 13)   VH High Risk (Greater than 13) ALL REQUIRED MODERATE INTERVENTIONS;ALL REQUIRED LOW INTERVENTIONS;RED "HIGH FALL RISK" SIGNAGE;BED ALARM WILL BE ACTIVATED WHEN THE PATEINT IS IN BED WITH SIGNAGE "RESET BED ALARM";PATIENT IS TO BE SUPERVISED FOR ALL TOILETING ACTIVITIES;A CHAIR PAD ALARM WILL BE USED WHEN PATIENT IS UP SITTING IN A CHAIR       Problem: Pain interferes with ability to perform ADL  Goal: Pain at adequate level as identified by patient  Outcome: Progressing   05/29/18 0213   Goal/Interventions addressed this shift   Pain at adequate level as identified by patient Identify patient comfort function goal;Assess for risk of opioid induced respiratory depression, including snoring/sleep apnea. Alert healthcare team of risk factors identified.;Assess pain on admission, during daily assessment and/or before any "as needed" intervention(s);Evaluate if patient comfort function goal is met;Evaluate patient's satisfaction with pain management progress;Offer non-pharmacological pain management interventions       Problem: Peripheral Neurovascular Impairment  Goal: Extremity color, movement, sensation are maintained or improved  Outcome: Progressing   05/29/18 0213   Goal/Interventions addressed this shift   Extremity color, movement, sensation are maintained or improved  Increase mobility as tolerated/progressive mobility;Assess extremity for proper alignment

## 2018-05-29 NOTE — Anesthesia Postprocedure Evaluation (Signed)
Anesthesia Post Operative Evaluation      Patient Name:     [name]    Procedures Performed:     Procedure(s) with comments:  ARTHROPLASTY, HIP, TOTAL, REVISION, ANTERIOR APPROACH, C ARM - RT REVISION PROSTETIC TOTAL HIP     Anesthesia Type:      General    Patient Location:     402/402-A    Last vitals:     Temp: Temp: 36.5 C (97.7 F)   HR: Heart Rate: 97   BP: BP: (!) 166/92   RR: Resp Rate: 14   SpO2 SpO2: 99 %     Post Op Pain:      Patient not complaining of pain, continue current therapy     Mental Status:      Awake    Respiratory Function:      Tolerating nasal cannula    Cardiovascular:      Stable    Nausea/Vomiting:      Patient not complaining of nausea or vomiting    Hydration Status:      Adequate    Post Assessment:      no apparent anesthetic complications    Kristen Loader, MD  05/29/2018 3:22 PM

## 2018-05-29 NOTE — Progress Notes (Addendum)
NURSE NOTE SUMMARY  [facility] - ORTHOPAEDICS   Patient Name: [name]   Attending Physician: Alease Medina, MD   Today's date:   05/29/2018 LOS: 3 days   Shift Summary:                                                              1400 Pt arrived from PACU at 1400  Pt reports pain level of   0/10   Orders released and reviewed, pt education and unit orientation complete.   Assessment is complete and will monitor pt. with rounds.    1900 Pt worked with mobility today and walked in room   IV intact  Pt is voiding WNL in the urinal   Dressings are clean dry and intact   Neuro-vascular assessment is intact.  Vital  signs reviewed:    Temp: 97.7 F (36.5 C), Heart Rate: (!) 117, Resp Rate: 14, BP: 128/71    Report given to accepting RN     End of shift.    Provider Notifications:      Rapid Response Notifications:  Mobility:      PMP Activity: Step 4 - Dangle at Bedside (05/28/2018  8:00 PM)   Weight tracking:  Family Dynamic:   Last 3 Weights for the past 72 hrs (Last 3 readings):   Weight   05/26/18 2238 135.5 kg (298 lb 11.6 oz)   05/26/18 1902 135 kg (297 lb 9.9 oz)        Healthcare Agent's Name: [facility] [place]  Healthcare Agent's Phone Number: [phone]   Recent Vitals Last Bowel Movement   BP (!) 166/92   Pulse 97   Temp 97.7 F (36.5 C) (Oral)   Resp 14   Ht 1.93 m (6\' 4" )   Wt 135.5 kg (298 lb 11.6 oz)   SpO2 99%   BMI 36.36 kg/m  Last BM Date: 05/18/18

## 2018-05-29 NOTE — Plan of Care (Signed)
Problem: Moderate/High Fall Risk Score >5  Goal: Patient will remain free of falls  Outcome: Progressing   05/29/18 1925   High Risk Falls Interventions (Greater than 13)   VH High Risk (Greater than 13) ALL REQUIRED LOW INTERVENTIONS;ALL REQUIRED MODERATE INTERVENTIONS;RED "HIGH FALL RISK" SIGNAGE;BED ALARM WILL BE ACTIVATED WHEN THE PATEINT IS IN BED WITH SIGNAGE "RESET BED ALARM";A CHAIR PAD ALARM WILL BE USED WHEN PATIENT IS UP SITTING IN A CHAIR;PATIENT IS TO BE SUPERVISED FOR ALL TOILETING ACTIVITIES;Keep door open for better visibility;Include family/significant other in multidisciplinary discussion regarding plan of care as appropriate;Use assistive devices;Use chair-pad alarm device;Use of "STOP ask for help" sign       Problem: Pain interferes with ability to perform ADL  Goal: Pain at adequate level as identified by patient  Outcome: Progressing   05/29/18 1925   Goal/Interventions addressed this shift   Pain at adequate level as identified by patient Identify patient comfort function goal;Assess for risk of opioid induced respiratory depression, including snoring/sleep apnea. Alert healthcare team of risk factors identified.;Assess pain on admission, during daily assessment and/or before any "as needed" intervention(s);Reassess pain within 30-60 minutes of any procedure/intervention, per Pain Assessment, Intervention, Reassessment (AIR) Cycle;Evaluate if patient comfort function goal is met;Evaluate patient's satisfaction with pain management progress;Offer non-pharmacological pain management interventions;Include patient/patient care companion in decisions related to pain management as needed       Problem: Side Effects from Pain Analgesia  Goal: Patient will experience minimal side effects of analgesic therapy  Outcome: Progressing   05/29/18 1925   Goal/Interventions addressed this shift   Patient will experience minimal side effects of analgesic therapy Monitor/assess patient's respiratory status (RR  depth, effort, breath sounds);Assess for changes in cognitive function;Prevent/manage side effects per LIP orders (i.e. nausea, vomiting, pruritus, constipation, urinary retention, etc.);Evaluate for opioid-induced sedation with appropriate assessment tool (i.e. POSS)       Problem: Impaired Mobility  Goal: Mobility/Activity is maintained at optimal level for patient  Outcome: Progressing   05/29/18 1925   Goal/Interventions addressed this shift   Mobility/activity is maintained at optimal level for patient Increase mobility as tolerated/progressive mobility;Encourage independent activity per ability;Maintain proper body alignment;Plan activities to conserve energy, plan rest periods;Perform active/passive ROM;Reposition patient every 2 hours and as needed unless able to reposition self;Assess for changes in respiratory status, level of consciousness and/or development of fatigue       Problem: Peripheral Neurovascular Impairment  Goal: Extremity color, movement, sensation are maintained or improved  Outcome: Progressing   05/29/18 1925   Goal/Interventions addressed this shift   Extremity color, movement, sensation are maintained or improved  Increase mobility as tolerated/progressive mobility;Assess extremity for proper alignment;Teach/review/reinforce ankle pump exercises;VTE Prevention: Administer anticoagulant(s) and/or apply anti-embolism stockings/devices as ordered

## 2018-05-29 NOTE — OR Nursing (Signed)
Prior to bringing patient back to room, assessed patients legs, pedal pulses and edema. Patients legs were swollen, right leg had 2+ pitting edema and bruising along entire right leg and hip area from prior surgery. Pedal pulses were hard to feel but faintly felt. After surgery and positioning boots removed feet were assessed again. Right foot was still very swollen, pitting edema seen. Pedal pulses were felt faintly on both feet.

## 2018-05-29 NOTE — Brief Op Note (Signed)
BRIEF OP NOTE    Date Time: 05/29/18 11:38 AM    Patient Name:   [name]    Date of Operation:   05/29/2018    Providers Performing:   Surgeon(s):  Evamaria Detore, Darrel Reach, MD    Assistant (s):   Circulator: Ferne Reus, RN  Radiology Technologist: Aniceto Boss  Scrub Person: Rae Lips, CSA  First Assistant: Silva Bandy  Second Circulator: Bess Kinds, RN  Second Scrub Person: Kenna Gilbert, RN    Operative Procedure:   Procedure(s):  ARTHROPLASTY, HIP, TOTAL, REVISION, ANTERIOR APPROACH, C ARM - Wound Class: Clean     Preoperative Diagnosis:   Pre-Op Diagnosis Codes:     * Fracture of prosthetic hip, initial encounter [T84.019A, Z96.649]    Postoperative Diagnosis:   Post-Op Diagnosis Codes:     * Fracture of prosthetic hip, initial encounter [T84.019A, Z96.649]    Anesthesia:   General    Estimated Blood Loss:       Implants:     Implant Name Type Inv. Item Serial No. Manufacturer Lot No. LRB No. Used Action   CABLE 2.0 MM - [id number]  CABLE 2.0 MM  VHBIOMET [id number] Right 2 Implanted   CABLE SLEEVE MEDIUM 2.0 MM - [id number]  CABLE SLEEVE MEDIUM 2.0 MM  VHBIOMET [id number] Right 2 Implanted   Arcos Modular Revision System Bowed Distal Stem with slot    BIOMET [id number] Right 1 Implanted   CONE FEM ARCOS SZ B STD - [id number]  CONE FEM ARCOS SZ B STD  VHBIOMET [id number] Right 1 Implanted   CER BIOLOX OPTION HD - [id number]  CER BIOLOX OPTION HD  VHBIOMET [id number] Right 1 Implanted   SLEEVE CER OPTION TYPE1 TPR 3 - [id number]  SLEEVE CER OPTION TYPE1 TPR 3  VHBIOMET [id number] Right 1 Implanted       Drains:   Drains: no    Specimens:    none    OR Specimens:   * No specimens in log *    Findings:        Complications:   none      Signed by: Len Childs, MD                                                                           [facility]

## 2018-05-29 NOTE — Op Note (Addendum)
Date: 05/29/2018  PREOPERATIVE DIAGNOSIS: Periprosthetic fracture about the right total  hip arthroplasty stem with no loosening of the acetabular  component, displaced.    POSTOPERATIVE DIAGNOSIS: Periprosthetic fracture about the right total  hip arthroplasty stem with no loosening of the acetabular  component, displaced.    PROCEDURE: Open reduction and internal fixation with cables and  placement of a long-stem implant, right total hip revision.    SURGEON: Len Childs, MD    ASSISTANT: Silva Bandy    ANESTHESIA: General.    COMPLICATIONS: None.    ESTIMATED BLOOD LOSS: 1000 cc.    SPECIMENS: None.    DISPOSITION: Stable to PACU.    DESCRIPTION OF PROCEDURE: The patient was correctly identified  in the preoperative area and appropriate extremity was signed by  the attending surgeon.  All risks and benefits were discussed.  All questions were answered.  A signed consent was placed in the  chart.  The patient was taken to the operating room.  The  patient underwent general anesthesia.  A time-out was completed  with all members in the room.  IV antibiotics were administered,  and all bony prominences were padded.  Venodyne were placed on  both lower extremities, and the right lower extremity was  prepped and draped in usual sterile fashion.  The original  incision site was made, but extended 2 cm in each direction.  Dissection was taken down through the fascia layer and the  sutures were removed from the fascia layer and down into the hip  joint.  The hip joint was encountered and hematoma was  evacuated.  I was clear that the femoral stem was loose and  subsided after identifying the femur.  The femur was dislocated  with traction and external rotation and then after extension and  adduction, the femoral stem and head were removed without  incident.  Next, the acetabulum was checked.  There was no  loosening of the acetabulum and no problems with the implant.  To obtain better exposure to the  femur, extensive releases were  made especially at the IT band, which was then repaired at the  end using a Vicryl suture.  Next, an accessory incision was made  distal at the lateral aspect of the femur and blunt dissection  was taken down to the bone at the level of the distal tip of the  fracture.  Two cables were passed circumferentially and did have  good location.  Third cable was attempted to be passed  superiorly up by the lesser trochanter, but this was unable to  be passed despite several attempts in several different  positions of the leg.  Next, reaming was attempted at the femur,  but the reamer was falling into the fracture site, so the two  distal cables were tightened sequentially using two cable grips  until I was satisfied with the reduction.  These were then  crimped and clamped off and then the remaining wire was removed.  After this, straight reamers were attempted but could not find  the femoral canal, so a guidewire was used to pass down the  femoral canal and then flexible reamers were used to ream the  canal.  Size 16 had a good chatter, size 17 was too tight, so 16  was selected.  A trial of the Biomet Arcos curved stem was  placed with a size B taper.  This did pass well and did afford a  very good reduction of the  fracture and good stability at the  bone.  So, I size B by 16 Arcos modular revision stem was used  that was 200 mm long and size B 60 mm top was placed.  This was  impacted under x-ray guidance, passed the fracture site.  This  did afford a very good reduction of the fracture and very good  solid fixation of the stem.  Next, 36 head with 0 neck was  trialed, but it was unstable anteriorly, so +3 was selected for  final impaction.  One more attempt at passing a proximal cable  was performed, but again this was not able to get all the way  around the proximal aspect of the bone, most likely due to the  size of the bone and the fracture was very well reduced, so the  wounds were  then copiously irrigated.  A local injection was  performed.  X-rays showed good fracture reduction with proper  placement of the hardware and good length of the leg.  The  inferior aspect of the incision with the lateral incision was  closed with Vicryl suture for the IT band, 2-0 Monocryls, and  staples.  The anterior hip incision was closed with a Vicryl  suture for the IT band, #2 Quill, and then a running 2-0 Quill  and staples at the end.  The patient awoke without any issues  and was taken to PACU in stable condition.  Typically, patient  like this would take about an hour and a half to do a cable  fixation as well as placement of a long-stem implant.  Because  of this patient's body habitus, the large size of the muscle,  and the complexity of the fracture, the positioning took 45  minutes, the case took 2.5 hours, and the closure took 30  minutes.        [id number]  DD: 05/29/2018 11:38:21  DT: 05/29/2018 12:30:17  JOB: [id number]

## 2018-05-29 NOTE — Progress Notes (Signed)
Medicine Progress Note - [facility]  [employer]   Patient Name: [name], [name] LOS: 3 days   Attending Physician: Alease Medina, MD PCP: Md, Out Of Network      Assessment and Plan:                                                                # Acute femur fracture, right periprosthetic  No evidence of acetabular abnormality on CT scan  S/p ORIF by Dr Emeline Darling today   Pain control     # Osteoarthritis of right hip   s/p right hip replacement per Dr Emeline Darling     #  Coronary artery disease  Continue beta-blockers    #  Atrial fibrillation/flutter- rate controlled  Continue Beta blocker  Resume Eliquis    #  Diabetes mellitus  Last HBA1c 5.5  Continue Glucotrol ; resume metformin + Januvia  Continue sliding scale     # Hypertension  Resume home meds-Coreg, Losartan and Norvasc  Add prn hydralazine    Disposition: Acute rehab weekly  DVT PPX:  SCDs  Code:  Full Code   Hospital Course   [name] is a 62 y.o. male who presents to the hospital with  Fall and leg pain, found to have fracture  He has h/o AF/flutter on eliquis, CAD s/p CABG, DM, HTN, had scheduled right hip arthroplasty on 6-17 for chronic arthritis, hen d/c , he was at home and today he stood up and his right knee gave up and he fell down, landed on his right side, then had significant right leg pain. Came to ER, here XR showed femur fracture below prosthesis level       EMR Generated Hospital Problem List      Subjective    had ORIF today by Dr. Emeline Darling - seems to be doing very well overall with minimal discomfort      Objective   Physical Exam:     Vitals: T:99 F (37.2 C) (Oral), BP:131/65, HR:83, RR:20, SaO2:95%    General: Patient is awake. In no acute distress.  HEENT: PERRL, EOMI, no conjunctival drainage, vision is intact.  Neck: supple, no thyromegaly.  Chest: CTA bilaterally. No rhonchi, no wheezing. No use of accessory muscles.  CVS: normal rate and regular rhythm no murmurs, without JVD.  Abdomen: soft,  non-tender, no guarding or rigidity, with normal bowel sounds.  Extremities: +1 pitting edema rt lower extremity, pulses palpable, no calf swelling and gross no deformity.  Skin: Warm, dry, no rash and no worrisome lesions.  NEURO: no motor or sensory deficits.  Psychiatric: alert, interactive, appropriate, normal affect.  Weight Monitoring 02/18/2018 05/06/2018 05/19/2018 05/26/2018 05/26/2018   Height 193 cm 193 cm 193 cm - 193 cm   Height Method Stated Stated Stated - -   Weight 141.522 kg 140.615 kg 136.1 kg 135 kg 135.5 kg   Weight Method Actual Stated - Actual Bed Scale   BMI (calculated) 38.1 kg/m2 37.8 kg/m2 36.6 kg/m2 - 36.4 kg/m2       Intake/Output Summary (Last 24 hours) at 05/29/18 4132  Last data filed at 05/29/18 0600   Gross per 24 hour   Intake             2005 ml  Output             1300 ml   Net              705 ml     Body mass index is 36.36 kg/m.   Meds:   Current Facility-Administered Medications   Medication Dose Route Frequency   . [MAR Hold] amLODIPine  10 mg Oral Daily   . bacitracin-polymixin-gentamicin   Intra-articular Once   . [MAR Hold] carvedilol  6.25 mg Oral BID Meals   . ceFAZolin  3 g Intravenous Pre-Op   . [MAR Hold] glipiZIDE  10 mg Oral BID AC   . [MAR Hold] insulin lispro  2-16 Units Subcutaneous TID AC    And   . [MAR Hold] insulin lispro  1-9 Units Subcutaneous QHS   . [MAR Hold] losartan  50 mg Oral Daily   . [MAR Hold] sodium chloride (PF)  3 mL Intravenous Q8H   . [MAR Hold] tiZANidine  4 mg Oral BID   . [MAR Hold] traMADol  50 mg Oral 4 times per day     . sodium chloride 100 mL/hr at 05/28/18 1237   . sodium chloride 100 mL/hr at 05/28/18 2222     PRN Meds: [MAR Hold] dextrose, fentaNYL (PF), [MAR Hold] glucagon (rDNA), [MAR Hold] hydrALAZINE, NSG Communication: Glucose POCT order (AC, HS) **AND** [MAR Hold] insulin lispro **AND** [MAR Hold] insulin lispro **AND** [MAR Hold] insulin lispro, [MAR Hold] morphine, [MAR Hold] naloxone, [MAR Hold] ondansetron, [MAR Hold]  oxyCODONE, [MAR Hold] oxyCODONE, oxyCODONE.   LABS:   Estimated Creatinine Clearance: 131.1 mL/min (based on SCr of 0.89 mg/dL).    Recent Labs  Lab 05/28/18  0956 05/27/18  0231   WBC 8.4 9.2   RBC 2.47* 2.87*   Hemoglobin 8.9* 10.1*   Hematocrit 26.7* 30.4*   MCV 108* 106*   PLT CT 355 393       Recent Labs  Lab 05/28/18  0956   PT 11.1   PT INR 1.1         Lab Results   Component Value Date    HGBA1CPERCNT 5.5 05/19/2018       Recent Labs  Lab 05/28/18  0956 05/27/18  0231 05/26/18  2128   Glucose 199* 224* 179*   Sodium 133* 137 135*   Potassium 4.7 3.9 4.1   Chloride 99 99 98   CO2 26.7 25 26.7   BUN 25* 29* 30*   Creatinine 0.89 1.20 1.22   EGFR 92 65 64   Calcium 8.8 9.3 9.7               Invalid input(s):  AMORPHOUSUA   Patient Lines/Drains/Airways Status    Active PICC Line / CVC Line / PIV Line / Drain / Airway / Intraosseous Line / Epidural Line / ART Line / Line / Wound / Pressure Ulcer / NG/OG Tube     Name:   Placement date:   Placement time:   Site:   Days:    Peripheral IV 05/26/18 Right Antecubital  05/26/18    2130    Antecubital    less than 1    Incision Site 05/26/18 Hip Right  05/26/18    2300      less than 1               Xr Hip Right 2-3 Vw Without Pelvis    Result Date: 05/26/2018  Acute fracture involving the mid femur adjacent  to the distal tip of the femoral prosthesis. ReadingStation:[id number]    Xr Knee Right 4+ Views    Result Date: 05/26/2018  1.  No acute fracture is identified.  ReadingStation:[id number]    Ct Hip Right Wo Contrast    Result Date: 05/27/2018  Postoperative change of right total hip replacement with right femur fracture involving the greater trochanteric region extending into the proximal and mid diaphyseal region of the right femur with lateral displacement of the fracture fragment distal to the shaft of the femoral prosthesis. Acetabular component appears in place with no acetabular fracture identified. ReadingStation:[id number]    US Venous Leg Right    Result  Date: 05/26/2018  No acute deep vein thrombosis is identified. ReadingStation:[id number]     Time spent:    Nutrition assessment done in collaboration with Registered Dietitians:        Alease Medina, MD     05/29/18,8:12 AM   MRN: [id number]                                      CSN: [id number] DOB: 1956/04/14

## 2018-05-29 NOTE — Transfer of Care (Signed)
Anesthesia Transfer of Care Note    Patient: [name]    Last vitals:   Vitals:    05/29/18 1200   BP: 149/72   Pulse: 82   Resp: 16   Temp: 36.8 C (98.2 F)   SpO2: 98%       Oxygen: Non-rebreather Mask     Mental Status:awake    Airway: Natural    Cardiovascular Status:  stable

## 2018-05-29 NOTE — Progress Notes (Signed)
[facility] PROGRESS NOTE    Date Time: 05/29/18 7:04 AM  Patient Name: Mendia,Connor MARK      Assessment:   Periprosthetic right hip fracture, no acetabular involvement    Plan:    Weight bearing status: NWB   Precautions: fall   PT/OT: hold   Anticoagulation: hold    Follow up: preop labs  OR today      Subjective:   Pain controlled with po pain meds    Medications:     Current Facility-Administered Medications   Medication Dose Route Frequency   . [MAR Hold] amLODIPine  10 mg Oral Daily   . bacitracin-polymixin-gentamicin   Intra-articular Once   . [MAR Hold] carvedilol  6.25 mg Oral BID Meals   . ceFAZolin  3 g Intravenous Q8H   . [MAR Hold] glipiZIDE  10 mg Oral BID AC   . [MAR Hold] insulin lispro  2-16 Units Subcutaneous TID AC    And   . [MAR Hold] insulin lispro  1-9 Units Subcutaneous QHS   . [MAR Hold] losartan  50 mg Oral Daily   . [MAR Hold] sodium chloride (PF)  3 mL Intravenous Q8H   . [MAR Hold] tiZANidine  4 mg Oral BID   . [MAR Hold] traMADol  50 mg Oral 4 times per day         Physical Exam:     Vitals:    05/28/18 2313   BP: 131/65   Pulse: 83   Resp: 20   Temp: 99 F (37.2 C)   SpO2: 95%       Intake and Output Summary (Last 24 hours) at Date Time    Intake/Output Summary (Last 24 hours) at 05/29/18 0704  Last data filed at 05/29/18 0600   Gross per 24 hour   Intake             2005 ml   Output             1300 ml   Net              705 ml         Neurological -  motor and sensory grossly normal  Musculoskeletal - SILT s/s/ta/dp/sp, toes wawp, +df/pf toes and ankle  + ecchymosis and edema    Labs:     Results     Procedure Component Value Units Date/Time    Dextrose Stick Glucose [[id number]]  (Abnormal) Collected:  05/29/18 0345    Specimen:  Blood Updated:  05/29/18 0401     Glucose, POCT 189 (H) mg/dL     Dextrose Stick Glucose [[id number]]  (Abnormal) Collected:  05/28/18 2147    Specimen:  Blood Updated:  05/28/18 2204     Glucose, POCT 206 (H) mg/dL      Dextrose Stick Glucose [[id number]]  (Abnormal) Collected:  05/28/18 1707    Specimen:  Blood Updated:  05/28/18 1725     Glucose, POCT 209 (H) mg/dL     Dextrose Stick Glucose [[id number]]  (Abnormal) Collected:  05/28/18 1116    Specimen:  Blood Updated:  05/28/18 1137     Glucose, POCT 218 (H) mg/dL     Type and Screen [[id number]] Collected:  05/28/18 0956    Specimen:  Blood Updated:  05/28/18 1119     ABO Rh A Positive     AB Screen NEGATIVE    Basic Metabolic Panel [[id number]]  (Abnormal) Collected:  05/28/18 0956    Specimen:  Plasma  Updated:  05/28/18 1042     Sodium 133 (L) mMol/L      Potassium 4.7 mMol/L      Chloride 99 mMol/L      CO2 26.7 mMol/L      Calcium 8.8 mg/dL      Glucose 960 (H) mg/dL      Creatinine 4.54 mg/dL      BUN 25 (H) mg/dL      Anion Gap 09.8 mMol/L      BUN/Creatinine Ratio 28.1 Ratio      EGFR 92 mL/min/1.73m2      Osmolality Calc 276 mOsm/kg     Prothrombin time/INR [[id number]] Collected:  05/28/18 0956    Specimen:  Blood Updated:  05/28/18 1042     PT 11.1 sec      PT INR 1.1    CBC with Automated Differential [[id number]]  (Abnormal) Collected:  05/28/18 0956    Specimen:  Blood from Blood Updated:  05/28/18 1034     WBC 8.4 K/cmm      RBC 2.47 (L) M/cmm      Hemoglobin 8.9 (L) gm/dL      Hematocrit 78.4 (L) %      MCV 108 (H) fL      MCH 36 (H) pg      MCHC 33 gm/dL      RDW 69.6 %      PLT CT 355 K/cmm      MPV 7.5 fL      NEUTROPHIL % 76.0 %      Lymphocytes 14.1 (L) %      Monocytes 9.3 %      Eosinophils % 0.3 %      Basophils % 0.4 %      Neutrophils Absolute 6.4 K/cmm      Lymphocytes Absolute 1.2 K/cmm      Monocytes Absolute 0.8 K/cmm      Eosinophils Absolute 0.0 K/cmm      BASO Absolute 0.0 K/cmm     Dextrose Stick Glucose [[id number]]  (Abnormal) Collected:  05/28/18 0726    Specimen:  Blood Updated:  05/28/18 0746     Glucose, POCT 171 (H) mg/dL           Rads:   No results found.    Signed by: Len Childs, MD

## 2018-05-29 NOTE — Anesthesia Preprocedure Evaluation (Signed)
Anesthesia Evaluation    AIRWAY    Mallampati: III    TM distance: >3 FB  Neck ROM: full  Mouth Opening:full  Planned to use difficult airway equipment: No CARDIOVASCULAR    irregular and normal       DENTAL    no notable dental hx     PULMONARY    clear to auscultation     OTHER FINDINGS                  Relevant Problems   (+) CAD (coronary artery disease)   (+) DM (diabetes mellitus)   (+) HTN (hypertension)       PSS Anesthesia Comments: 62 yo M with hx of CAD s/p CABG in 2011, HTN, DM2 and atrial flutter (on eliquis, last dose on 6/24). Patient is able to achieve >4 METS activity without chest pain or shortness of breath. NPO since MN last night. Plan for GETA given need for muscle relaxation.    TTE 07/2017:  Impressions:    - The patient appears to be in atrial fibrillation. Normal LV size  with moderate LV hypertrophy. EF 60-65%. Mildly dilated RV with  normal systolic function. Biatrial enlargement. Mild pulmonary  hypertension. Dilated IVC suggestive of elevated RV filling  pressure.        Anesthesia Plan    ASA 3     general                     intravenous induction   Detailed anesthesia plan: general endotracheal        Post op pain management: per surgeon    informed consent obtained    Plan discussed with CRNA.    ECG reviewed  pertinent labs reviewed             Signed by: Conley Canal 05/29/18 6:59 AM

## 2018-05-30 ENCOUNTER — Encounter: Payer: Self-pay | Admitting: Orthopaedic Surgery

## 2018-05-30 LAB — BASIC METABOLIC PANEL
Anion Gap: 9.4 mMol/L (ref 7.0–18.0)
BUN / Creatinine Ratio: 25 Ratio (ref 10.0–30.0)
BUN: 22 mg/dL (ref 7–22)
CO2: 26.5 mMol/L (ref 20.0–30.0)
Calcium: 7.9 mg/dL — ABNORMAL LOW (ref 8.5–10.5)
Chloride: 101 mMol/L (ref 98–110)
Creatinine: 0.88 mg/dL (ref 0.80–1.30)
EGFR: 93 mL/min/{1.73_m2} (ref 60–150)
Glucose: 126 mg/dL — ABNORMAL HIGH (ref 71–99)
Osmolality Calc: 269 mOsm/kg — ABNORMAL LOW (ref 275–300)
Potassium: 4.9 mMol/L (ref 3.5–5.3)
Sodium: 132 mMol/L — ABNORMAL LOW (ref 136–147)

## 2018-05-30 LAB — CROSSMATCH PRBCS, 2 UNITS
01 -   Blood Type: A POS
01 -   Issue Date/Time: 201906271115
01 -   Status Info: TRANSFUSED
02 -   Blood Type: A POS
02 -   Issue Date/Time: 201906271115
02 -   Status Info: TRANSFUSED

## 2018-05-30 LAB — CBC
Hematocrit: 22 % — ABNORMAL LOW (ref 39.0–52.5)
Hemoglobin: 7.4 gm/dL — ABNORMAL LOW (ref 13.0–17.5)
MCH: 34 pg (ref 28–35)
MCHC: 34 gm/dL (ref 32–36)
MCV: 103 fL — ABNORMAL HIGH (ref 80–100)
MPV: 7.2 fL (ref 6.0–10.0)
PLT CT: 307 10*3/uL (ref 130–440)
RBC: 2.14 10*6/uL — ABNORMAL LOW (ref 4.00–5.70)
RDW: 15.3 % — ABNORMAL HIGH (ref 11.0–14.0)
WBC: 10.3 10*3/uL (ref 4.0–11.0)

## 2018-05-30 LAB — CROSSMATCH PRBC, 1 UNIT
01 -   Blood Type: A NEG
01 -   Issue Date/Time: 201906280854
01 -   Status Info: TRANSFUSED

## 2018-05-30 LAB — VH DEXTROSE STICK GLUCOSE
Glucose POCT: 129 mg/dL — ABNORMAL HIGH (ref 71–99)
Glucose POCT: 174 mg/dL — ABNORMAL HIGH (ref 71–99)
Glucose POCT: 241 mg/dL — ABNORMAL HIGH (ref 71–99)
Glucose POCT: 196 mg/dL — ABNORMAL HIGH (ref 71–99)
Glucose POCT: 166 mg/dL — ABNORMAL HIGH (ref 71–99)

## 2018-05-30 NOTE — PT Eval Note (Signed)
[facility]  Patient: [name]     CSN: [id number]    Bed: 402/402-A  Physical Therapy EVALUATION  Visit#: 1   Treatment Frequency: (P) 7x/wk  Last seen by a physical therapist vs. Physical therapist assistant: 05/30/2018    DISCHARGE RECOMMENDATIONS   Discharge Recommendations:   Other(Comment) (inpatient rehab)       *Discharge recommendations are subject to change based on patient's progress and/or home support changes - please refer to most recent PT note for current recommendation    DME recommended for Discharge:   Has needed equipment    PMP (Progressive Mobility Program) Recommendations:   Recommend patient  transfer to chair/BSC 2-3 times/day with Wheeled walker and physical assist and/or supervision of 1 staff as tolerated.     Precautions and Contraindications:   Weight Bearing: Right LE Non weight bearing  Falls  Mobility protocol     PT Assessment and Plan of Care (Treatment frequency noted above):     HPI (per physician charting) and Pertinent Medical Details:  Admitted 05/26/2018 with/for R femoral ORIF due to femoral fracture sustained with fall. Patient is s/p R THA (05/19/18).     Goals:    In 2-5 visits  1. Patient will perform supine to/from sit transfer with minimal assistance demonstrating improved functional mobility. NEW  2. Patient will perform sit to/from stand transfer with walker and minimal assistance demonstrating improved functional mobility for out of bed activities. NEW  3. Patient will ambulate 5 feet with walker and minimal assistance in preparation for return to household ambulation distances. NEW  4. Patient will demonstrate independent compliance with weight bearing restrictions through out PT session during functional mobility. NEW    PT Assessment:  Prior to admission, patient was modified independent and was ambulating primarily with a SPC (occasionally with a FWW) at the household level and was requiring assistance for certain household duties.  Today, patient required moderate assistance x2 for supine to/from sit transfers and minimal assistance x2 for sit to/from stand transfers with a walker. Patient scooted sideways with walker but was unable to hop forwards while maintaining NWB restrictions. PT recommends discharge to inpatient rehab with continued therapy.     Patient presenting with the following PT Impairments:decreased ROM, decreased strength, decreased safety/judgement during functional mobility, decreased activity tolerance, decreased functional mobility, decreased balance, gait deficits, pain, decreased sensation    Patient will benefit from skilled PT services in order to address listed impairments and maximize functional mobility     Treatment/interventions: Exercise, Gait training, Stair training, Neuromuscular re-education, Functional transfer training, LE strengthening/ROM, Cognitive reorientation, Patient/caregiver training, Equipment eval/education, Bed mobility, Continued evaluation    Due to the presence of several treatment options and 1-2 comorbidities or personal factors that affect performance, as well as patient's stable and/or uncomplicated characteristics, minimal to moderate modifications of mobility and/or assistance were necessary to complete evaluation when examining total of 4 or more elements (includes body structures and functions, activity limitations and/or participation restrictions) determines the degree of complexity for this patient is LOW    Rehabilitation Potential:Good with ongoing PT upon discharge from acute care.  24 hour supervision recommended.    Discussed risk, benefits and Plan of Care with: Patient    *note: Clinical Presentation and Decision Making includes the following sections: Goals, PT assessment, treatment frequency and treatment/interventions):    History Based on physician charted EPIC/EMR information:     Medical Diagnosis: Contusion of right knee, initial encounter [S80.01XA]  Type 2  diabetes  mellitus treated without insulin [E11.9]  Closed fracture of shaft of right femur, unspecified fracture morphology, initial encounter [S72.301A]  Atrial flutter, unspecified type [I48.92]  Hypertension, unspecified type [I10]    Xr Hip Right 2-3 Vw Without Pelvis    Result Date: 05/26/2018  Acute fracture involving the mid femur adjacent to the distal tip of the femoral prosthesis. ReadingStation:[id number]    Xr Knee Right 4+ Views    Result Date: 05/26/2018  1.  No acute fracture is identified.  ReadingStation:[id number]    Ct Hip Right Wo Contrast    Result Date: 05/27/2018  Postoperative change of right total hip replacement with right femur fracture involving the greater trochanteric region extending into the proximal and mid diaphyseal region of the right femur with lateral displacement of the fracture fragment distal to the shaft of the femoral prosthesis. Acetabular component appears in place with no acetabular fracture identified. ReadingStation:[id number]    US Venous Leg Right    Result Date: 05/26/2018  No acute deep vein thrombosis is identified. ReadingStation:[id number]    Xr Femur Right 1 View (see Comments)    Result Date: 05/29/2018  1. Appropriate positioning of the new right femoral prosthesis. 2. Right femoral diaphyseal fracture immobilized with a longer stem and reinforced with 2 wire loops. ReadingStation:[id number]    Problem list:  Patient Active Problem List   Diagnosis   . Osteoarthritis of right hip   . Femur fracture, right   . CAD (coronary artery disease)   . AF (atrial fibrillation)   . DM (diabetes mellitus)   . HTN (hypertension)      Past Medical/Surgical History:  Past Medical History:   Diagnosis Date   . Atrial flutter    . CAD (coronary artery disease)    . Carpal tunnel syndrome     H/O   . DM2 (diabetes mellitus, type 2)    . ED (erectile dysfunction)    . Encounter for blood transfusion    . Hypertension    . MI (myocardial infarction) '08, '11   . OA (osteoarthritis)    .  Psoriasis       Past Surgical History:   Procedure Laterality Date   . ARTHROPLASTY, HIP, TOTAL, ANTERIOR APPROACH, C ARM Right 05/19/2018    Procedure: ARTHROPLASTY, HIP, TOTAL, ANTERIOR APPROACH, C ARM;  Surgeon: Len Childs, MD;  Location: [facility];  Service: Orthopedics;  Laterality: Right;  RIGHT TOTAL HIP REPLACEMENT, ASI    . ARTHROPLASTY, HIP, TOTAL, REVISION, ANTERIOR APPROACH, C ARM Right 05/29/2018    Procedure: ARTHROPLASTY, HIP, TOTAL, REVISION, ANTERIOR APPROACH, C ARM;  Surgeon: Len Childs, MD;  Location: [facility];  Service: Orthopedics;  Laterality: Right;  RT REVISION PROSTETIC TOTAL HIP    . BICEPS TENDON REPAIR Right    . CARDIOVERSION     . CORONARY ARTERY BYPASS GRAFT  2011   . CYST REMOVAL      RIGHT CHEEK   . INGUINAL HERNIA REPAIR Right     AS CHILD   . KNEE ARTHROTOMY Left 1975   . ORIF, RADIUS, DISTAL (WRIST) Right    . SINUS SURGERY         Social History    Information per Patient:    Home Living Arrangements:  Living Arrangements: Friends  Assistance Available: friend works during the day, so patient would be alone at this time.   Type of Home: House  Home Layout: One level, 4 steps with 1  HR and then 3 steps with no HR in order to enter home    Prior Level of Function:  Prior to admission, patient was modified independent and was ambulating primarily with a SPC (occasionally with a FWW) at the household level and was requiring assistance for certain household duties  Fall history: only fall reported is that which is associated with recent admission.     DME available at home:  Front wheeled walker   Single point cane    Subjective   Patient stated "I just can't get my foot up far enough off the ground" regarding his inability to take a step forward.   Patient/family/caregiver consent to therapy session is noted by the participation in the therapy session.    Patient/caregiver goal for PT: return to previous levels of activity and  independence.     Pain:  At Rest: 3, 4/10  With Activity: 10/10  Location: Hip:  right  Interventions: Medication (see eMAR), Repositioned, RN/LPN aware    Examination of Body Systems (Structures, Function, Activity and Participation)   Patient's medical condition is appropriate for Physical therapy intervention at this time    Observation of patient:  Patient is in bed with Bed/chair alarm on, peripheral IV     Cognition:  Oriented to: Oriented x4  Command following: Follows ALL commands and directions without difficulty  Attention Span:Appears intact  Memory: Appears intact  Insights: Fully aware of deficits    Vital Signs (Cardiovascular):  BP Supine:  117/66 mmHg  HR Supine: 95 bpm  SpO2 at rest: 97%    Sensation: intact , to light touch, bilateral lower extremities except for absent sensation on R lateral hip around greater trochanter.    Balance:  patient sat unsupported at edge of bed, required bilateral UE support on walker and minimal assistance x2 for transfers and standing, all with no loss of balance.            Musculoskeletal Examination:     Range of motion:  Right LE: Grossly WFL except for patient with limited active hip flexion and knee extension in sitting due to reports of pain.   Left LE: Grossly WFL       Strength:  Right Hip Flexion:  unable to actively flex beyond seated position due to pain  Right Knee Flexion:  3+/5  Right Knee Extension: unable to actively extend beyond seated position due to pain  Right Ankle Dorsiflexion:  5/5  Left Hip Flexion:  4+/5  Left Knee Flexion:  4+/5  Left Knee Extension:  5/5  Left Ankle Dorsiflexion:  5/5    Tone:  Not tested    Functional Mobility:    Bed Mobility:  Supine to Sit:   moderate assistance x2.   Cues for Log rolling., Cues for hooklying position. , Cues for Hand placement., HOB elevated, Bed rail used, assist at trunk, assist at LE(s)  Sit to Supine:   to the left moderate assistance x2.   Cues for Sequencing., HOB elevated, Bed rail used,  assist at trunk, assist at LE(s)    Transfers:  Sit to Stand:  minimal assistance x2 with Front wheeled walker.         Stand to Sit:  minimal assistance x2.           Locomotion:  Patient took 3 scoots to the left with walker and minimal assistance x2 but was unable to hop forwards or clear foot off the ground.       Treatment  Interventions this session:   Evaluation  Therapeutic activity  Patient/family/caregiver education  Equipment evaluation/education  Architectural technologist    Education Provided:   TOPICS: role of physical therapy, plan of care, goals of therapy and HEP, safety with mobility and ADLs, benefits of activity, weight bearing precautions, use of adaptive equipment, bed mobility with use of adaptive equipment or strategy, activity with nursing     Learner educated: Patient  Method: Explanation  Response to education: Verbalized understanding    Patient Position at End of Treatment:   Supine, in bed, in the room, Staff present: nursing staff initiating new blood to be delivered. , Needs in reach, Bed/chair alarm set and No distress    Team Communication:     Spoke to: OT -  Nettie Elm  Regarding: Pre-session re: patient status, Patient position at end of session, Discharge needs, Patient participation with Therapy, Vital signs, Further recommendations  Whiteboard updated: No  PT/PTA communication: via written note and verbal communication as needed.    Time of treatment:  Time Calculation  PT Received On: 05/30/18  Start Time: 1252  Stop Time: 1312  Time Calculation (min): 20 min    Pascal Lux, SPT    ______________________________________________________________________    I have reviewed and agree with the documentation authored by my student of the evaluation/ treatment/education/care plan delivered during my direct supervision.     Co-Signed by:  Maryann Alar, PT  ______________________________________________________________________

## 2018-05-30 NOTE — Plan of Care (Signed)
Problem: Moderate/High Fall Risk Score >5  Goal: Patient will remain free of falls  Outcome: Progressing   05/30/18 2000   High Risk Falls Interventions (Greater than 13)   VH High Risk (Greater than 13) ALL REQUIRED LOW INTERVENTIONS;ALL REQUIRED MODERATE INTERVENTIONS;RED "HIGH FALL RISK" SIGNAGE;BED ALARM WILL BE ACTIVATED WHEN THE PATEINT IS IN BED WITH SIGNAGE "RESET BED ALARM";A CHAIR PAD ALARM WILL BE USED WHEN PATIENT IS UP SITTING IN A CHAIR;PATIENT IS TO BE SUPERVISED FOR ALL TOILETING ACTIVITIES;Keep door open for better visibility;Include family/significant other in multidisciplinary discussion regarding plan of care as appropriate;Use assistive devices;Use of "STOP ask for help" sign;Use chair-pad alarm device       Problem: Impaired Mobility  Goal: Mobility/Activity is maintained at optimal level for patient  Outcome: Progressing   05/30/18 0200   Goal/Interventions addressed this shift   Mobility/activity is maintained at optimal level for patient Increase mobility as tolerated/progressive mobility;Encourage independent activity per ability;Maintain proper body alignment;Perform active/passive ROM;Plan activities to conserve energy, plan rest periods;Reposition patient every 2 hours and as needed unless able to reposition self;Assess for changes in respiratory status, level of consciousness and/or development of fatigue       Problem: Peripheral Neurovascular Impairment  Goal: Extremity color, movement, sensation are maintained or improved  Outcome: Progressing   05/30/18 2000   Goal/Interventions addressed this shift   Extremity color, movement, sensation are maintained or improved  Increase mobility as tolerated/progressive mobility;Assess extremity for proper alignment;Teach/review/reinforce ankle pump exercises;VTE Prevention: Administer anticoagulant(s) and/or apply anti-embolism stockings/devices as ordered

## 2018-05-30 NOTE — Progress Notes (Signed)
[facility] PROGRESS NOTE    Date Time: 05/30/18 7:43 AM  Patient Name: [name],[name]      Assessment:   Postoperative day 1 status post open reduction internal fixation right periprosthetic femur fracture with longstem femur implant    Plan:    Weight bearing status: Nonweightbearing right lower extremity   Precautions: Anterior hip, fall   PT/OT: Out of bed   Anticoagulation: Apixaban starting today   Follow up: Transfusing 2 units for acute blood loss anemia, follow-up CBC in the morning   Will work with physical therapy today, plan for rehab placement once medically stable      Subjective:   Pain controlled overnight    Medications:     Current Facility-Administered Medications   Medication Dose Route Frequency   . acetaminophen  650 mg Oral Q4H   . amLODIPine  10 mg Oral Daily   . apixaban  5 mg Oral Q12H   . [START ON 05/31/2018] bisacodyl  5 mg Oral Daily   . carvedilol  6.25 mg Oral BID Meals   . glipiZIDE  10 mg Oral BID AC   . insulin lispro  2-16 Units Subcutaneous TID AC    And   . insulin lispro  1-9 Units Subcutaneous QHS   . losartan  50 mg Oral Daily   . metFORMIN  500 mg Oral BID Meals   . polyethylene glycol  17 g Oral BID   . SITagliptin  50 mg Oral BID   . sodium chloride (PF)  3 mL Intravenous Q8H   . tiZANidine  4 mg Oral BID   . traMADol  50 mg Oral 4 times per day         Physical Exam:     Vitals:    05/30/18 0711   BP: 125/59   Pulse: 82   Resp: 16   Temp: 99 F (37.2 C)   SpO2: 95%       Intake and Output Summary (Last 24 hours) at Date Time    Intake/Output Summary (Last 24 hours) at 05/30/18 0743  Last data filed at 05/30/18 0557   Gross per 24 hour   Intake             4310 ml   Output             2975 ml   Net             1335 ml         Neurological -  motor and sensory grossly normal  Musculoskeletal - SILT s/s/ta/dp/sp, toes wawp, +df/pf toes and ankle    Incision- dressing clean dry and intact    Labs:     Results     Procedure Component Value  Units Date/Time    Prepare/Crossmatch Red Blood Cells:  One Unit [[id number]] Collected:  05/30/18 0743    Specimen:  Blood Updated:  05/30/18 0743    Prepare/Crossmatch Red Blood Cells:  One Unit [[id number]] Collected:  05/30/18 0742    Specimen:  Blood Updated:  05/30/18 0742    Basic metabolic panel [[id number]]  (Abnormal) Collected:  05/30/18 0448    Specimen:  Plasma Updated:  05/30/18 0610     Sodium 132 (L) mMol/L      Potassium 4.9 mMol/L      Chloride 101 mMol/L      CO2 26.5 mMol/L      Calcium 7.9 (L) mg/dL      Glucose 010 (H)  mg/dL      Creatinine 1.61 mg/dL      BUN 22 mg/dL      Anion Gap 9.4 mMol/L      BUN/Creatinine Ratio 25.0 Ratio      EGFR 93 mL/min/1.83m2      Osmolality Calc 269 (L) mOsm/kg     CBC (qam x3) [[id number]]  (Abnormal) Collected:  05/30/18 0448    Specimen:  Blood from Blood Updated:  05/30/18 0544     WBC 10.3 K/cmm      RBC 2.14 (L) M/cmm      Hemoglobin 7.4 (L) gm/dL      Hematocrit 81.1 (L) %      MCV 103 (H) fL      MCH 34 pg      MCHC 34 gm/dL      RDW 91.4 (H) %      PLT CT 307 K/cmm      MPV 7.2 fL     Dextrose Stick Glucose [[id number]]  (Abnormal) Collected:  05/30/18 0311    Specimen:  Blood Updated:  05/30/18 0328     Glucose, POCT 129 (H) mg/dL     Prepare/Crossmatch Red Blood Cells:  Two Units [[id number]] Collected:  05/29/18 1055    Specimen:  Blood Updated:  05/29/18 2359     01 - Product ID Red Blood Cells     01 - Unit Number [id number]     01 -   Cross Match Compatible     01 -   Status Info Transfused     01 - Product Code E0336V00     01 -   Blood Type A Pos     01 -   Issue Date/Time [id number]     02 - Product ID Red Blood Cells     02 - Unit Number [id number]     02 -   Cross Match Compatible     02 -   Status Info Transfused     02 - Product Code E0336V00     02 -   Blood Type A Pos     02 -   Issue Date/Time [id number]    Dextrose Stick Glucose [[id number]]  (Abnormal) Collected:  05/29/18 2101    Specimen:  Blood Updated:  05/29/18 2117      Glucose, POCT 285 (H) mg/dL     Dextrose Stick Glucose [[id number]]  (Abnormal) Collected:  05/29/18 1841    Specimen:  Blood Updated:  05/29/18 1901     Glucose, POCT 291 (H) mg/dL     Dextrose Stick Glucose [[id number]]  (Abnormal) Collected:  05/29/18 1602    Specimen:  Blood Updated:  05/29/18 1618     Glucose, POCT 258 (H) mg/dL     Dextrose Stick Glucose [[id number]]  (Abnormal) Collected:  05/29/18 1203    Specimen:  Blood Updated:  05/29/18 1220     Glucose, POCT 250 (H) mg/dL     I-Stat CG8 Plus [[id number]]  (Abnormal) Collected:  05/29/18 1037    Specimen:  Arterial Updated:  05/29/18 1202     pH, ISTAT 7.33 (L)     PO2, ISTAT 42 (LL) mm Hg      BE, ISTAT -9 (L) mMol/L      HCO3, ISTAT 16.1 (L) mMol/L      PCO2, ISTAT 30.5 (L) mm Hg      O2 Sat, %, ISTAT 75 (L) %      i-STAT Allen's Test Pass  i-STAT FIO2 100.00 %      Sample, ISTAT Venous     Site, ISTAT OTHER     i-STAT Sodium 132 (L) mMol/L      i-STAT Potassium 4.8 mMol/L      TCO2, ISTAT 17 (L) mMol/L      Ionized Ca, ISTAT 4.40 mg/dL      i-STAT Glucose 657 (H) mg/dL      i-STAT Hematocrit <15.0 (LL) %      i-STAT Hemoglobin       Unable to calculate result due to analyte out of the analytical measurement range     gm/dL     Operator, ISTAT Operator: [id number] [facility]    I-Stat CG8 Plus [[id number]]  (Abnormal) Collected:  05/29/18 1046    Specimen:  Arterial Updated:  05/29/18 1202     pH, ISTAT 7.37     PO2, ISTAT 46 (LL) mm Hg      BE, ISTAT 0 mMol/L      HCO3, ISTAT 25.6 mMol/L      PCO2, ISTAT 44.3 mm Hg      O2 Sat, %, ISTAT 80 (L) %      i-STAT Allen's Test Pass     i-STAT FIO2 0.10 %      Sample, ISTAT Venous     Site, ISTAT OTHER     i-STAT Sodium 132 (L) mMol/L      i-STAT Potassium 5.4 (H) mMol/L      TCO2, ISTAT 27 mMol/L      Ionized Ca, ISTAT 4.50 mg/dL      i-STAT Glucose 841 (H) mg/dL      i-STAT Hematocrit 22.0 (L) %      i-STAT Hemoglobin 7.5 (L) gm/dL      Operator, ISTAT Operator: [id number] KRISTIN          Rads:   Xr Femur  Right 1 View (see Comments)    Result Date: 05/29/2018  1. Appropriate positioning of the new right femoral prosthesis. 2. Right femoral diaphyseal fracture immobilized with a longer stem and reinforced with 2 wire loops. ReadingStation:[id number]      Signed by: Len Childs, MD

## 2018-05-30 NOTE — Progress Notes (Signed)
Quick Doc  [facility] - ORTHOPAEDICS   Patient Name: [name]   Attending Physician: Alease Medina, MD   Today's date:   05/30/2018 LOS: 4 days   Expected Discharge Date      Quick  Assessment:                                                              CM Comments: 05/30/18 DCP: Received referral from [facility] for inpatient rehab. Patient s/p ORIF right periprosthetic femur fracture 6/27. Patient resides in [place], but was staying locally with friend after original right hip ORIF 6/17. Patient fell at the home which resulted in need of additional surgery. Completed initial assessment with patient, at bedside. Discussed levels of care, referral process, insurance information and need for insurance authorization. Patient agreeable to short term rehab. Preference is [facility] for acute rehab. Referral sent. If [facility] unable to offer bed patient's SNF preferences are [facility] or [facility]. Referrals sent to [facility] region SNF's, per policy. Awaiting bed offers. DCP will continue to follow.           Facility List and Fact Sheet Provided: Y/N  Yes   Facility Preferences and Order of Preference   [facility]  [facility] status         Does the patient need a UAI   Yes, if SNF, requested   Insurance coverage    BCBS out of state   Med Assist Referral    No   FIS completed No                                                                                  Provider Notifications:      Judd Gaudier, [facility]  Discharge Planner Social Worker  [phone]

## 2018-05-30 NOTE — Progress Notes (Signed)
NURSE NOTE SUMMARY  [facility] - ORTHOPAEDICS   Patient Name: [name]   Attending Physician: Alease Medina, MD   Today's date:   05/30/2018 LOS: 4 days   Shift Summary:                                                              1610 Assumed care of pt, assessment complete, pt denies any needs at this time.    Dressings intact, IV intact.    Reviewed plan of care for the day:   Ambulate with PT   Pain control, pt will use white board to keep track of medication times.   Temp: 98.2 F (36.8 C), Heart Rate: 80, Resp Rate: 14, BP: 104/55     Will continue to monitor on rounds.    9604 Blood orders released and vitals taken. Blood dual verified and started at 159ml/hr. Patient tolerating well. Will continue to monitor.     1310 Second unit of blood started, vitals taken. Patient tolerating well     1415 Blood pressure medications were given at scheduled time. Patient rang call bell stating he felt dizzy and light headed. BP was taken- 103/55. Patient has not other complaints and is asymptomatic. Patient was laid flat. Vitals were retaken in 15 minutes and were 104/55.             Provider Notifications:      Rapid Response Notifications:  Mobility:      PMP Activity: Step 5 - Chair (05/30/2018  8:10 AM)   Weight tracking:  Family Dynamic:   No data found.       Healthcare Agent's Name: [facility]  Healthcare Agent's Phone Number: [phone]   Recent Vitals Last Bowel Movement   BP 104/55   Pulse 80   Temp 98.2 F (36.8 C) (Oral)   Resp 14   Ht 1.93 m (6\' 4" )   Wt 135.5 kg (298 lb 11.6 oz)   SpO2 94%   BMI 36.36 kg/m  Last BM Date: 05/30/18

## 2018-05-30 NOTE — Progress Notes (Signed)
Medicine Progress Note - [facility]  [employer]   Patient Name: [name], [name] LOS: 4 days   Attending Physician: Alease Medina, MD PCP: Md, Out Of Network      Assessment and Plan:                                                                # post hemorrhagic anemia  - transfuse 2 units pRBC  - hold BP meds for today    # Acute femur fracture, right periprosthetic  No evidence of acetabular abnormality on CT scan  S/p ORIF by Dr Emeline Darling POD1  Pain control     # Osteoarthritis of right hip   s/p right hip replacement per Dr Emeline Darling     #  Coronary artery disease  Continue beta-blockers    #  Atrial fibrillation/flutter- rate controlled  Continue Beta blocker  Resume Eliquis    #  Diabetes mellitus  Last HBA1c 5.5  Continue Glucotrol ; resume metformin + Januvia  Continue sliding scale     # Hypertension  hold home meds-Coreg, Losartan and Norvasc  Add prn hydralazine    Disposition: Acute rehab weekly  DVT PPX:  SCDs  Code:  Full Code   Hospital Course   [name] is a 62 y.o. male who presents to the hospital with  Fall and leg pain, found to have fracture  He has h/o AF/flutter on eliquis, CAD s/p CABG, DM, HTN, had scheduled right hip arthroplasty on 6-17 for chronic arthritis, hen d/c , he was at home and today he stood up and his right knee gave up and he fell down, landed on his right side, then had significant right leg pain. Came to ER, here XR showed femur fracture below prosthesis level     EMR Generated Hospital Problem List      Subjective     Pain controlled; H/H was low so receiving transfusion  Tolerated PT      Objective   Physical Exam:     Vitals: T:99 F (37.2 C) (Oral), BP:125/59, HR:82, RR:16, SaO2:95%    General: Patient is awake. In no acute distress.  HEENT: PERRL, EOMI, no conjunctival drainage, vision is intact.  Neck: supple, no thyromegaly.  Chest: CTA bilaterally. No rhonchi, no wheezing. No use of accessory muscles.  CVS: normal rate and regular  rhythm no murmurs, without JVD.  Abdomen: soft, non-tender, no guarding or rigidity, with normal bowel sounds.  Extremities: +1 pitting edema rt lower extremity, pulses palpable, no calf swelling and gross no deformity.  Skin: Warm, dry, no rash and no worrisome lesions.  NEURO: no motor or sensory deficits.  Psychiatric: alert, interactive, appropriate, normal affect.  Weight Monitoring 02/18/2018 05/06/2018 05/19/2018 05/26/2018 05/26/2018   Height 193 cm 193 cm 193 cm - 193 cm   Height Method Stated Stated Stated - -   Weight 141.522 kg 140.615 kg 136.1 kg 135 kg 135.5 kg   Weight Method Actual Stated - Actual Bed Scale   BMI (calculated) 38.1 kg/m2 37.8 kg/m2 36.6 kg/m2 - 36.4 kg/m2       Intake/Output Summary (Last 24 hours) at 05/30/18 0827  Last data filed at 05/30/18 0557   Gross per 24 hour   Intake  4310 ml   Output             2975 ml   Net             1335 ml     Body mass index is 36.36 kg/m.   Meds:   Current Facility-Administered Medications   Medication Dose Route Frequency   . acetaminophen  650 mg Oral Q4H   . amLODIPine  10 mg Oral Daily   . apixaban  5 mg Oral Q12H   . [START ON 05/31/2018] bisacodyl  5 mg Oral Daily   . carvedilol  6.25 mg Oral BID Meals   . glipiZIDE  10 mg Oral BID AC   . insulin lispro  2-16 Units Subcutaneous TID AC    And   . insulin lispro  1-9 Units Subcutaneous QHS   . losartan  50 mg Oral Daily   . metFORMIN  500 mg Oral BID Meals   . polyethylene glycol  17 g Oral BID   . SITagliptin  50 mg Oral BID   . sodium chloride (PF)  3 mL Intravenous Q8H   . tiZANidine  4 mg Oral BID   . traMADol  50 mg Oral 4 times per day     . sodium chloride 100 mL/hr at 05/28/18 1237   . sodium chloride 100 mL/hr at 05/28/18 2222   . lactated ringers 100 mL/hr at 05/30/18 0004     PRN Meds: acetaminophen, dextrose, diphenhydrAMINE, diphenhydrAMINE, glucagon (rDNA), hydrALAZINE, NSG Communication: Glucose POCT order (AC, HS) **AND** insulin lispro **AND** insulin lispro **AND** insulin  lispro, morphine, naloxone, naloxone, ondansetron, ondansetron **OR** ondansetron, oxyCODONE, oxyCODONE, promethazine **OR** promethazine **OR** promethazine.   LABS:   Estimated Creatinine Clearance: 132.5 mL/min (based on SCr of 0.88 mg/dL).    Recent Labs  Lab 05/30/18  0448 05/28/18  0956   WBC 10.3 8.4   RBC 2.14* 2.47*   Hemoglobin 7.4* 8.9*   Hematocrit 22.0* 26.7*   MCV 103* 108*   PLT CT 307 355       Recent Labs  Lab 05/28/18  0956   PT 11.1   PT INR 1.1         Lab Results   Component Value Date    HGBA1CPERCNT 5.5 05/19/2018       Recent Labs  Lab 05/30/18  0448 05/28/18  0956 05/27/18  0231   Glucose 126* 199* 224*   Sodium 132* 133* 137   Potassium 4.9 4.7 3.9   Chloride 101 99 99   CO2 26.5 26.7 25   BUN 22 25* 29*   Creatinine 0.88 0.89 1.20   EGFR 93 92 65   Calcium 7.9* 8.8 9.3               Invalid input(s):  AMORPHOUSUA   Patient Lines/Drains/Airways Status    Active PICC Line / CVC Line / PIV Line / Drain / Airway / Intraosseous Line / Epidural Line / ART Line / Line / Wound / Pressure Ulcer / NG/OG Tube     Name:   Placement date:   Placement time:   Site:   Days:    Peripheral IV 05/26/18 Right Antecubital  05/26/18    2130    Antecubital    less than 1    Incision Site 05/26/18 Hip Right  05/26/18    2300      less than 1  Xr Hip Right 2-3 Vw Without Pelvis    Result Date: 05/26/2018  Acute fracture involving the mid femur adjacent to the distal tip of the femoral prosthesis. ReadingStation:[id number]    Xr Knee Right 4+ Views    Result Date: 05/26/2018  1.  No acute fracture is identified.  ReadingStation:[id number]    Ct Hip Right Wo Contrast    Result Date: 05/27/2018  Postoperative change of right total hip replacement with right femur fracture involving the greater trochanteric region extending into the proximal and mid diaphyseal region of the right femur with lateral displacement of the fracture fragment distal to the shaft of the femoral prosthesis. Acetabular component  appears in place with no acetabular fracture identified. ReadingStation:[id number]    US Venous Leg Right    Result Date: 05/26/2018  No acute deep vein thrombosis is identified. ReadingStation:[id number]    Xr Femur Right 1 View (see Comments)    Result Date: 05/29/2018  1. Appropriate positioning of the new right femoral prosthesis. 2. Right femoral diaphyseal fracture immobilized with a longer stem and reinforced with 2 wire loops. ReadingStation:[email or web address]     Time spent:    Nutrition assessment done in collaboration with Registered Dietitians:        Alease Medina, MD     05/30/18,8:27 AM   MRN: [id number]                                      CSN: [id number] DOB: August 08, 1956

## 2018-05-30 NOTE — Plan of Care (Signed)
Problem: Moderate/High Fall Risk Score >5  Goal: Patient will remain free of falls  Outcome: Progressing   05/30/18 0810   High Risk Falls Interventions (Greater than 13)   VH High Risk (Greater than 13) ALL REQUIRED LOW INTERVENTIONS;ALL REQUIRED MODERATE INTERVENTIONS;BED ALARM WILL BE ACTIVATED WHEN THE PATEINT IS IN BED WITH SIGNAGE "RESET BED ALARM";RED "HIGH FALL RISK" SIGNAGE;A CHAIR PAD ALARM WILL BE USED WHEN PATIENT IS UP SITTING IN A CHAIR;PATIENT IS TO BE SUPERVISED FOR ALL TOILETING ACTIVITIES       Problem: Pain interferes with ability to perform ADL  Goal: Pain at adequate level as identified by patient  Outcome: Progressing   05/29/18 1925   Goal/Interventions addressed this shift   Pain at adequate level as identified by patient Identify patient comfort function goal;Assess for risk of opioid induced respiratory depression, including snoring/sleep apnea. Alert healthcare team of risk factors identified.;Assess pain on admission, during daily assessment and/or before any "as needed" intervention(s);Reassess pain within 30-60 minutes of any procedure/intervention, per Pain Assessment, Intervention, Reassessment (AIR) Cycle;Evaluate if patient comfort function goal is met;Evaluate patient's satisfaction with pain management progress;Offer non-pharmacological pain management interventions;Include patient/patient care companion in decisions related to pain management as needed       Problem: Side Effects from Pain Analgesia  Goal: Patient will experience minimal side effects of analgesic therapy  Outcome: Progressing   05/29/18 1925   Goal/Interventions addressed this shift   Patient will experience minimal side effects of analgesic therapy Monitor/assess patient's respiratory status (RR depth, effort, breath sounds);Assess for changes in cognitive function;Prevent/manage side effects per LIP orders (i.e. nausea, vomiting, pruritus, constipation, urinary retention, etc.);Evaluate for opioid-induced  sedation with appropriate assessment tool (i.e. POSS)

## 2018-05-30 NOTE — OT Eval Note (Signed)
[facility]: [facility]  Department of Rehabilitation Services: [phone]    Hebert Dooling    CSN#: [id number]  ORTHOPAEDICS 402/402-A    Occupational Therapy Evaluation    Consult received for Ned Card for OT Evaluation and Treatment.  Patient's medical condition is appropriate for Occupational therapy intervention at this time.    Time of treatment:   Time Calculation  OT Received On: 05/30/18  Start Time: 1255  Stop Time: 1313  Time Calculation (min): 18 min    Visit#: 1    Precautions and Contraindications:   Falls  Hip Precautions/ ROM Restrictions: AVOID combination of hip hyperextension and external rotation (Order [id number])   Weight bearing with restrictions- Lower Extremities (Order [id number]) LLE FWB; RLE NWB    OT Assessment/Clinical Decision Making:      Guilherme Schwenke is a 62 y.o. male admitted 05/26/2018 presenting with displaced right periprosthetic fracture about the total hip arthroplasty stem with no loosening of the acetabular component s/p open reduction and internal fixation with cables and placement of a long-stem implant, OA, a-fib, DM, HTN resulting in impairments with decreased ROM, decreased strength, balance deficits, edema, decreased activity tolerance and fall risk. These impairments are affecting the patient's ability to perform in needed/desired occupations resulting in performance deficits in bathing/showering, toileting & toilet hygiene, dressing, functional mobility/transfers and personal hygiene & grooming. Patient unable to maintain his NWB status throughout session. The evaluation was also limited as he was due for his second unit of blood. Would benefit from continued occupational therapy services for above noted impairments.     Rehabilitation Potential: Good     Risks/benefits/POC discussed: Patient    Plan:   Treatment/interventions: ADL retraining, functional transfer training    Treatment Frequency: OT Frequency Recommended:  4-5x/wk    Goals:   STGs: (3 sessions)  1. Pt will don pants/undergarment with min A using AE PRN in prep for regaining skills to maximize functional independence. NEW GOAL  2. Pt will don/doff socks with min A using AE PRN in prep for regaining skills to maximize functional independence. NEW GOAL  3. Pt will be min A for BSC transfers in prep for regaining skills to maximize functional independence. NEW GOAL    LTG: (By time of discharge)  Pt will complete self-care tasks & BR transfers with setup/supervision in prep for regaining skills to maximize functional independence. NEW GOAL    DISCHARGE RECOMMENDATIONS   DME recommended for Discharge:   Wheelchair - manual    Discharge Recommendations:    (inpatient rehab) 24 hour supervision needed, 24 hour physical assistance needed    Medical & Therapy History:   Medical Diagnosis: Contusion of right knee, initial encounter [S80.01XA]  Type 2 diabetes mellitus treated without insulin [E11.9]  Closed fracture of shaft of right femur, unspecified fracture morphology, initial encounter [S72.301A]  Atrial flutter, unspecified type [I48.92]  Hypertension, unspecified type [I10]    Oden Lindaman is a 62 y.o. male admitted on 05/26/2018 with a fall and leg pain. He was "admitted 05/19/2018 presenting with righthip osteoarthritis; s/p rightanterior total hip arthroplasty." "He was at home and... stood up and his right knee gave up and he fell down, landed on his right side, then had significant right leg pain. Came to ER, here XR showed femur fracture below prosthesis level." Dx: displaced right periprosthetic fracture about the total hip arthroplasty stem with no loosening of the acetabular component, OA, a-fib, DM, HTN. 6/27 s/p open reduction and internal  fixation with cables and placement of a long-stem implant. Now with OT orders.    X-Rays/Tests/Labs:  XR Hip Right: 05/26/2018 Acute fracture involving the mid femur adjacent to the distal tip of the femoral  prosthesis.    XR Knee Right: 05/26/2018 1.  No acute fracture is identified.     CT Hip Right: 05/27/2018 Postoperative change of right total hip replacement with right femur fracture involving the greater trochanteric region extending into the proximal and mid diaphyseal region of the right femur with lateral displacement of the fracture fragment distal to the shaft of the femoral prosthesis. Acetabular component appears in place with no acetabular fracture identified.     US Venous Leg Right: 05/26/2018 No acute deep vein thrombosis is identified.    XR Femur Right: 05/29/2018 1. Appropriate positioning of the new right femoral prosthesis. 2. Right femoral diaphyseal fracture immobilized with a longer stem and reinforced with 2 wire loops.    Discussed with patient/family/caregiver the patient's physical, cognitive and/or psychosocial history related to current functional performance: yes    Previous therapy services: yes from prior hospitalization    Patient Active Problem List   Diagnosis   . Osteoarthritis of right hip   . Femur fracture, right   . CAD (coronary artery disease)   . AF (atrial fibrillation)   . DM (diabetes mellitus)   . HTN (hypertension)      Past Medical/Surgical History:  Past Medical History:   Diagnosis Date   . Atrial flutter    . CAD (coronary artery disease)    . Carpal tunnel syndrome     H/O   . DM2 (diabetes mellitus, type 2)    . ED (erectile dysfunction)    . Encounter for blood transfusion    . Hypertension    . MI (myocardial infarction) '08, '11   . OA (osteoarthritis)    . Psoriasis       Past Surgical History:   Procedure Laterality Date   . ARTHROPLASTY, HIP, TOTAL, ANTERIOR APPROACH, C ARM Right 05/19/2018    Procedure: ARTHROPLASTY, HIP, TOTAL, ANTERIOR APPROACH, C ARM;  Surgeon: Len Childs, MD;  Location: [facility];  Service: Orthopedics;  Laterality: Right;  RIGHT TOTAL HIP REPLACEMENT, ASI    . ARTHROPLASTY, HIP, TOTAL, REVISION, ANTERIOR APPROACH, C  ARM Right 05/29/2018    Procedure: ARTHROPLASTY, HIP, TOTAL, REVISION, ANTERIOR APPROACH, C ARM;  Surgeon: Len Childs, MD;  Location: [facility];  Service: Orthopedics;  Laterality: Right;  RT REVISION PROSTETIC TOTAL HIP    . BICEPS TENDON REPAIR Right    . CARDIOVERSION     . CORONARY ARTERY BYPASS GRAFT  2011   . CYST REMOVAL      RIGHT CHEEK   . INGUINAL HERNIA REPAIR Right     AS CHILD   . KNEE ARTHROTOMY Left 1975   . ORIF, RADIUS, DISTAL (WRIST) Right    . SINUS SURGERY         Occupational Profile:   Information obtained from: patient and chart review    Home Living Arrangements  Living Arrangements: Alone in [place] but is here in [place] for surgery and staying with ex girlfriend (Dr. Tressia Danas) and her daughter   Assistance Available: 24 hour assistance  Type of Home: House  Home Layout: Multi-level, with 7 total (4 STE with an HR then 3 STE without a HR) stair(s) to enter, Able to live on main level with bedroom and bathroom  Bathroom:  standard toilet;  walk-in-shower  DME Currently at Home: Energy manager  Single point cane  Front wheeled walker    Prior Level of Function  Mobility: Mobility:  Independent with  No assistive device  Fall History: denied                Activities of Daily Living  Patient was independent with all ADLs    Instrumental Activities of Daily Living  Patient was independent with all IADLs.    Work  [employer] movement psychotherapist goal for OT: none stated    Patient/caregiver concerns & priorities: none stated    Subjective:   Patient reported the plan is to go to rehab    Patient is agreeable to participation in the therapy session. Nursing clears patient for therapy.     Pain:  At Rest: 4 /10, With Activity: 4/10, Location: Hip:  right , Interventions: Medication (see eMAR)          ASSESSMENT OF OCCUPATIONAL PERFORMANCE:   Observation of Patient:    Patient is in bed with dressings in place. Pt seen with PT present.      Vital Signs:   BP: 151/58 mmHg     Oriented to: Oriented x4  Command following: Follows 1 step commands without difficulty  Alertness/Arousal: Appropriate responses to stimuli   Attention Span:Appears intact  Memory: Appears intact  Safety Awareness: Independent  Insights: Fully aware of deficits  Problem Solving: Assistance required to generate solutions, Assistance required to implement solutions  Behavior: Cooperative  Motor planning: Intact  Coordination: Within Normal Limits (WNL)  Hand dominance: Right handed    Musculoskeletal Examination:   Range of motion:  Bilateral UE: Grossly WFL       Strength:  Bilateral UE: Grossly WFL    Sensory/Oculomotor Examination:   Auditory:  WFL=intact  Tactile-Light Touch:  WFL=intact  Visual Acuity: no acute complaints/changes reported    Activities of Daily Living:   Eating:  Supervision/Set up, Simulated;   Grooming: Supervision/Set up, Simulated, seated  UB dressing:  Supervision/Set up, Simulated, seated  LB dressing: Total assist, don/doff R sock, don/doff L sock, in bed, seated at edge of bed  Bathing:  Moderate assist, Simulated;  Toileting:  Minimal assist, Simulated;    The Barthel Index  The patient scored a 50/100.     Higher scores reflect greater independence.  A patient who scores 100 is continent; independent in feeding, dressing, getting in and out of bed, andbathing; can walk at least one block; and can ascend and descend stairs without help.   A score of 0-20 suggests total dependence  21-60 severe dependence  61-90 moderate dependence  91-99 slight dependence.    SCORE PREDICTION   Less than 40 Unlikely to go home.   - Dependent in Mobility   - Dependent in Self Care   60 Pivotal score where patients move from dependency to assisted independence.   60-80 If living alone will probably need a number of community services to cope.   More than 85 Likely to be discharged to community living   - Independent in transfers and able to walk or usewheelchair  independently.     The Barthel Index was administered. Results are as follows:   Feeding 10 = independent   Bathing 0 = dependent   Grooming 5 = independent face/hair/teeth/shaving (implements provided)   Dressing 5 = needs help but can do about half unaided  Bowel management 10 = continent   Bladder management 10 = continent   Toilet use 5 = needs some help, but can do something alone   Transfers (Bed to chair and back) 5 = major help (one or two people, physical), can sit   Mobility (on level surfaces) 0 = immoble or less than 50 yards   Stairs 0 = unable     Functional Mobility:   Bed Mobility:  Supine to Sit:   Maximal assist (assist x2 for safety).   Cues for Sequencing., Cues for Hand placement., HOB elevated, Bed rail used  Sit to Supine:   max A for LE and min A for UB.   Cues for Sequencing., Cues for Hand placement., HOB flat, Bed rail used    Transfers:  Sit to Stand from bed:  Minimal assist (assist x2 for safety) with Front wheeled walker.   Cues for Sequencing, Cues for Hand Placement  Stand to Sit on bed:  Minimal assist.        Functional mobility/ambulation: Minimal assist with Front wheeled walker.   couple side steps to the left along [facility] but unable to maintain NWB throughout despite education/training    Balance:   [employer] Sitting Balance Scale  3+/5  Patient sits without upper extremity support for 30 seconds or greater.  [employer] Standing Balance Scale  1+/5  Patient supports self with upper extremities but requires therapist assistance. Patient performs >50% of effort. (Minimal assist).    Activity Tolerance:   Activity Tolerance: Tolerates 10-20 minutes of activity with multiple rests    Other Treatment Interventions:   Treatment Activities:   Not applicable          Education Provided:   Topics: Role of occupational therapy, plan of care, goals of therapy and safety with mobility and ADLs, benefits of activity, weight bearing precautions, discharge instructions, home  safety.    Individuals educated: Patient.  Method: Explanation.  Response to education: Verbalized understanding.    Team Communication:   OT communicated with: RN/LPN   OT communicated regarding: Pre-session re: patient status, Patient position at end of session, Patient participation with Therapy  OT/COTA communication: via written note and verbal communication as needed.    Patient left Supine in bed with Needs in reach, with Bed/chair alarm set and in No distress in place.    Recommend patient to be OOB for all meals & toileting needs outside of and in addition to OT session.    Allie Dimmer, MS, OTR/L

## 2018-05-30 NOTE — Progress Notes (Signed)
Pt rested in between care.  Pain was controlled with oral medications.  Dressings are dry and intact.  IV intact and IV fluid infusing well.  Pt is voiding WNL in the urinal.  Neuro-vascular assessment is intact.  Vital  signs reviewed:    Temp: 99.1 F (37.3 C), Heart Rate: 89, Resp Rate: 16, BP: 119/68  Call bell within reach.  Will continue to monitor pt on rounds.

## 2018-05-31 LAB — BASIC METABOLIC PANEL
Anion Gap: 10.9 mMol/L (ref 7.0–18.0)
BUN / Creatinine Ratio: 21.1 Ratio (ref 10.0–30.0)
BUN: 15 mg/dL (ref 7–22)
CO2: 22.4 mMol/L (ref 20.0–30.0)
Calcium: 8.3 mg/dL — ABNORMAL LOW (ref 8.5–10.5)
Chloride: 101 mMol/L (ref 98–110)
Creatinine: 0.71 mg/dL — ABNORMAL LOW (ref 0.80–1.30)
EGFR: 101 mL/min/{1.73_m2} (ref 60–150)
Glucose: 125 mg/dL — ABNORMAL HIGH (ref 71–99)
Osmolality Calc: 263 mOsm/kg — ABNORMAL LOW (ref 275–300)
Potassium: 4.3 mMol/L (ref 3.5–5.3)
Sodium: 130 mMol/L — ABNORMAL LOW (ref 136–147)

## 2018-05-31 LAB — CBC
Hematocrit: 25 % — ABNORMAL LOW (ref 39.0–52.5)
Hemoglobin: 8.3 gm/dL — ABNORMAL LOW (ref 13.0–17.5)
MCH: 34 pg (ref 28–35)
MCHC: 33 gm/dL (ref 32–36)
MCV: 101 fL — ABNORMAL HIGH (ref 80–100)
MPV: 7.3 fL (ref 6.0–10.0)
PLT CT: 301 10*3/uL (ref 130–440)
RBC: 2.48 10*6/uL — ABNORMAL LOW (ref 4.00–5.70)
RDW: 17.3 % — ABNORMAL HIGH (ref 11.0–14.0)
WBC: 10.1 10*3/uL (ref 4.0–11.0)

## 2018-05-31 LAB — CROSSMATCH PRBC, 1 UNIT
01 -   Blood Type: A POS
01 -   Issue Date/Time: 201906281252
01 -   Status Info: TRANSFUSED

## 2018-05-31 LAB — VH DEXTROSE STICK GLUCOSE
Glucose POCT: 138 mg/dL — ABNORMAL HIGH (ref 71–99)
Glucose POCT: 161 mg/dL — ABNORMAL HIGH (ref 71–99)
Glucose POCT: 221 mg/dL — ABNORMAL HIGH (ref 71–99)
Glucose POCT: 179 mg/dL — ABNORMAL HIGH (ref 71–99)
Glucose POCT: 153 mg/dL — ABNORMAL HIGH (ref 71–99)

## 2018-05-31 LAB — ECG 12-LEAD
Patient Age: 62 years
Q-T Interval(Corrected): 465 ms
Q-T Interval: 368 ms
QRS Axis: -22 deg
QRS Duration: 96 ms
T Axis: 100 years
Ventricular Rate: 96 //min

## 2018-05-31 LAB — CREATININE, URINE, RANDOM: Urine Creatinine Random: 131.82 mg/dL

## 2018-05-31 LAB — SODIUM, URINE, RANDOM: Urine Sodium Random: 44 mMol/L

## 2018-05-31 LAB — OSMOLALITY, URINE: Urine Osmolality: 771 mOsm/kg (ref 392–1090)

## 2018-05-31 MED ORDER — SODIUM CHLORIDE 1 G PO TABS
1.00 g | ORAL_TABLET | Freq: Three times a day (TID) | ORAL | Status: DC
Start: 2018-05-31 — End: 2018-06-02
  Administered 2018-05-31 – 2018-06-02 (×5): 1 g via ORAL
  Filled 2018-05-31 (×7): qty 1

## 2018-05-31 NOTE — Progress Notes (Signed)
Pt slept mostly during this shift.  Pain was controlled with oral medications.  Dressings are dry and intact.  IV intact.  Pt is voiding WNL in the urinal.  Neuro-vascular assessment is intact.  Vital  signs reviewed:    Temp: 99 F (37.2 C), Heart Rate: 88, Resp Rate: 18, BP: 115/90  Call bell within reach.  Will continue to monitor pt on rounds.

## 2018-05-31 NOTE — PT Eval Note (Signed)
[facility]  Patient: [name]     CSN: [id number]    Bed: 402/402-A  Physical Therapy EVALUATION  Visit#: 2   Treatment Frequency: 7x/wk  Last seen by a physical therapist vs. Physical therapist assistant: 05/31/2018     PT Assessment:  Prior to admission, patient was modified independent and was ambulating primarily with a SPC (occasionally with a FWW) at the household level and was requiring assistance for certain household duties.   MOD A FOR SUPINE TO SIT. MIN A FOR SPT WITH WHEELED WALKER. INDEPENDENT W/C MOBILITY X 220'. VERY WEAK RLE. MARKED SWELLING RLE.     DISCHARGE RECOMMENDATIONS   Discharge Recommendations:   Acute Rehab       *Discharge recommendations are subject to change based on patient's progress and/or home support changes - please refer to most recent PT note for current recommendation    DME recommended for Discharge:   Has needed equipment    PMP (Progressive Mobility Program) Recommendations:   Recommend patient  transfer to chair/BSC 2-3 times/day with Wheeled walker and physical assist and/or supervision of 1 staff as tolerated.     Precautions and Contraindications:   Weight Bearing: Right LE Non weight bearing  Falls  Mobility protocol     PT Assessment and Plan of Care (Treatment frequency noted above):     HPI (per physician charting) and Pertinent Medical Details:  Admitted 05/26/2018 with/for R femoral ORIF due to femoral fracture sustained with fall. Patient is s/p R THA (05/19/18).     Goals:    In 2-5 visits  1. Patient will perform supine to/from sit transfer with minimal assistance demonstrating improved functional mobility. ONGOING  2. Patient will perform sit to/from stand transfer with walker and minimal assistance demonstrating improved functional mobility for out of bed activities. MET  3. Patient will ambulate 5 feet with walker and minimal assistance in preparation for return to household ambulation distances. ONGOING  4. Patient will demonstrate  independent compliance with weight bearing restrictions through out PT session during functional mobility. MET  5. Patient will perform LE HEP with supervision to increase RLE AROM and strength in prep for increased mobility.  NEW  6. Patient will perform sit to/from stand transfer with walker and SUPERVISION demonstrating improved functional mobility for out of bed activities. NEW      Patient presenting with the following PT Impairments:decreased ROM, decreased strength, decreased safety/judgement during functional mobility, decreased activity tolerance, decreased functional mobility, decreased balance, gait deficits, pain, decreased sensation    Patient will benefit from skilled PT services in order to address listed impairments and maximize functional mobility     Treatment/interventions: Exercise, Gait training, Stair training, Neuromuscular re-education, Functional transfer training, LE strengthening/ROM, Cognitive reorientation, Patient/caregiver training, Equipment eval/education, Bed mobility, Continued evaluation    Rehabilitation Potential:Good with ongoing PT upon discharge from acute care.  24 hour supervision recommended.    Discussed risk, benefits and Plan of Care with: Patient    *note: Clinical Presentation and Decision Making includes the following sections: Goals, PT assessment, treatment frequency and treatment/interventions):    History Based on physician charted EPIC/EMR information:     Medical Diagnosis: Contusion of right knee, initial encounter [S80.01XA]  Type 2 diabetes mellitus treated without insulin [E11.9]  Closed fracture of shaft of right femur, unspecified fracture morphology, initial encounter [S72.301A]  Atrial flutter, unspecified type [I48.92]  Hypertension, unspecified type [I10]    Xr Hip Right 2-3 Vw Without Pelvis  Result Date: 05/26/2018  Acute fracture involving the mid femur adjacent to the distal tip of the femoral prosthesis. ReadingStation:[id number]    Xr Knee Right 4+  Views    Result Date: 05/26/2018  1.  No acute fracture is identified.  ReadingStation:[id number]    Ct Hip Right Wo Contrast    Result Date: 05/27/2018  Postoperative change of right total hip replacement with right femur fracture involving the greater trochanteric region extending into the proximal and mid diaphyseal region of the right femur with lateral displacement of the fracture fragment distal to the shaft of the femoral prosthesis. Acetabular component appears in place with no acetabular fracture identified. ReadingStation:[id number]    US Venous Leg Right    Result Date: 05/26/2018  No acute deep vein thrombosis is identified. ReadingStation:[id number]    Xr Femur Right 1 View (see Comments)    Result Date: 05/29/2018  1. Appropriate positioning of the new right femoral prosthesis. 2. Right femoral diaphyseal fracture immobilized with a longer stem and reinforced with 2 wire loops. ReadingStation:[id number]    Problem list:  Patient Active Problem List   Diagnosis   . Osteoarthritis of right hip   . Femur fracture, right   . CAD (coronary artery disease)   . AF (atrial fibrillation)   . DM (diabetes mellitus)   . HTN (hypertension)      Past Medical/Surgical History:  Past Medical History:   Diagnosis Date   . Atrial flutter    . CAD (coronary artery disease)    . Carpal tunnel syndrome     H/O   . DM2 (diabetes mellitus, type 2)    . ED (erectile dysfunction)    . Encounter for blood transfusion    . Hypertension    . MI (myocardial infarction) '08, '11   . OA (osteoarthritis)    . Psoriasis       Past Surgical History:   Procedure Laterality Date   . ARTHROPLASTY, HIP, TOTAL, ANTERIOR APPROACH, C ARM Right 05/19/2018    Procedure: ARTHROPLASTY, HIP, TOTAL, ANTERIOR APPROACH, C ARM;  Surgeon: Len Childs, MD;  Location: [facility];  Service: Orthopedics;  Laterality: Right;  RIGHT TOTAL HIP REPLACEMENT, ASI    . ARTHROPLASTY, HIP, TOTAL, REVISION, ANTERIOR APPROACH, C ARM Right  05/29/2018    Procedure: ARTHROPLASTY, HIP, TOTAL, REVISION, ANTERIOR APPROACH, C ARM;  Surgeon: Len Childs, MD;  Location: [facility];  Service: Orthopedics;  Laterality: Right;  RT REVISION PROSTETIC TOTAL HIP    . BICEPS TENDON REPAIR Right    . CARDIOVERSION     . CORONARY ARTERY BYPASS GRAFT  2011   . CYST REMOVAL      RIGHT CHEEK   . INGUINAL HERNIA REPAIR Right     AS CHILD   . KNEE ARTHROTOMY Left 1975   . ORIF, RADIUS, DISTAL (WRIST) Right    . SINUS SURGERY         Subjective   "I WAS TIRED YESTERDAY AND COULDN'T GIVE MY BEST EFFORT. I FEEL BETTER TODAY."  Patient/family/caregiver consent to therapy session is noted by the participation in the therapy session.    Patient/caregiver goal for PT: return to previous levels of activity and independence.     Pain:  At Rest: 3, 4/10  With Activity: 7/10  Location: Hip:  right  Interventions: Medication (see eMAR), Repositioned, RN/LPN aware    Examination of Body Systems (Structures, Function, Activity and Participation)   Patient's medical condition is appropriate  for Physical therapy intervention at this time    Observation of patient:  Patient is in bed with Bed/chair alarm on    Cognition:  Oriented to: Oriented x4  Command following: Follows ALL commands and directions without difficulty  Alertness/Arousal: Appropriate responses to stimuli   Attention Span:Appears intact  Memory: Appears intact  Safety Awareness: minimal verbal instruction  Insights: Fully aware of deficits  Problem Solving: Assistance required to identify errors made, Assistance required to generate solutions, Assistance required to implement solutions, minimal assistance    Vital Signs (Cardiovascular):  ACTIVITY:145/88, 98 HR, 98% SPO2            Balance:  Static Sitting:  Good  Static Standing:  Good WITH WHEELED WALKER USE  Dynamic Standing:  Fair+ WITH WHEELED WALKER USE.          Functional Mobility:    Bed Mobility:  Supine to Sit:   Moderate assist.   Cues for  Sequencing., Bed rail used, ASSIST FOR RLE. GOOD ASSIST WITH LLE AS INSTRUCTED.  Sit to Supine:   LEFT OOB IN A W/C..          Transfers:  Sit to Stand:  Minimal assist with Front wheeled walker.    Cues for Hand Placement, Cues for Walker Management  Stand to Sit:  Supervision GOOD CONTROL WITH UE ASSIST.Marland Kitchen    Cues for Sequencing, Cues for Hand Placement, Cues for Walker Management    Locomotion:  PATIENT WAS UNABLE TO AMB FORWARD WITH 3 POINT GAIT, BUT WAS ABLE TO PIVOT WITH A HOP STEP. SLOW MOVEMENT WITH VERY SHORT STEP LENGTH. LE'S MOVED FORWARD TOGETHER FOR VERY SMALL STEPS. NO LOB. MAINTAINED NWB RLE.    W/C MOBILITY: PATIENT PROPELLED W/C 220' INDEPENDENTLY. HE WAS ABLE TO MANUVER IN LIMITED SPACE AND AVOIDED OBSTACLES.      Treatment Interventions this session:   Therapeutic activity  Patient/family/caregiver education  THEREX: AP, QS, GS, ABD/ADD. MOD A FOR ABD/ADD. UNABLE TO PERFORM SLR, SAQ, AND LAQ WITH NO QUAD CONTRACTION NOTED. PATIENT WAS UNABLE TO USE A LEG LIFTER FOR RLE MOVEMENT.  W/C TRAINING    Education Provided:   TOPICS: role of physical therapy, plan of care, goals of therapy and HEP, safety with mobility and ADLs, benefits of activity, weight bearing precautions, activity with nursing     Learner educated: Patient  Method: Explanation and Demonstration  Response to education: Verbalized understanding    Patient Position at End of Treatment:   Sitting, in the room, Needs in reach and No distress    Team Communication:     Spoke to: RN  Regarding: Pre-session re: patient status, Patient position at end of session, Discharge needs, Patient participation with Therapy, Vital signs, Further recommendations  Whiteboard updated: No  PT/PTA communication: via written note and verbal communication as needed.    Time of treatment:  Time Calculation  PT Received On: 05/31/18  Start Time: 1610  Stop Time: 0904  Time Calculation (min): 25 min    Maryann Alar,  PT  ______________________________________________________________________

## 2018-05-31 NOTE — Progress Notes (Signed)
Medicine Progress Note - [facility]  [employer]   Patient Name: [name],[name] LOS: 5 days   Attending Physician: Alease Medina, MD PCP: Md, Out Of Network      Assessment and Plan:                                                                # post hemorrhagic anemia  - transfused 2 units pRBC- H/H stable  - resume BP meds     # Hyponatremia likely SIADH  - CXR in AM  - possibly from stress  - Trial of fluid restriction - 1200 ml a day + salt tablets    # Acute femur fracture, right periprosthetic  No evidence of acetabular abnormality on CT scan  S/p ORIF by Dr Emeline Darling POD1  Pain control     # Osteoarthritis of right hip   s/p right hip replacement per Dr Emeline Darling     #  Coronary artery disease  Continue beta-blockers    #  Atrial fibrillation/flutter- rate controlled  Continue Beta blocker  Resume Eliquis    #  Diabetes mellitus  Last HBA1c 5.5  Continue Glucotrol ; resume metformin + Januvia  Continue sliding scale     # Hypertension  hold home meds-Coreg, Losartan and Norvasc  Add prn hydralazine    Disposition: Acute rehab weekly  DVT PPX:  SCDs  Code:  Full Code   Hospital Course   [name] is a 62 y.o. male who presents to the hospital with  Fall and leg pain, found to have fracture  He has h/o AF/flutter on eliquis, CAD s/p CABG, DM, HTN, had scheduled right hip arthroplasty on 6-17 for chronic arthritis, hen d/c , he was at home and today he stood up and his right knee gave up and he fell down, landed on his right side, then had significant right leg pain. Came to ER, here XR showed femur fracture below prosthesis level     EMR Generated Hospital Problem List      Subjective     Pain controlled; no dizziness  Tolerated PT      Objective   Physical Exam:     Vitals: T:98.6 F (37 C) (Oral), BP:139/84, HR:82, RR:14, SaO2:96%    General: Patient is awake. In no acute distress.  HEENT: PERRL, EOMI, no conjunctival drainage, vision is intact.  Neck: supple, no  thyromegaly.  Chest: CTA bilaterally. No rhonchi, no wheezing. No use of accessory muscles.  CVS: normal rate and regular rhythm no murmurs, without JVD.  Abdomen: soft, non-tender, no guarding or rigidity, with normal bowel sounds.  Extremities: +1 pitting edema rt lower extremity, pulses palpable, no calf swelling and gross no deformity.  Skin: Warm, dry, no rash and no worrisome lesions.  NEURO: no motor or sensory deficits.  Psychiatric: alert, interactive, appropriate, normal affect.  Weight Monitoring 02/18/2018 05/06/2018 05/19/2018 05/26/2018 05/26/2018   Height 193 cm 193 cm 193 cm - 193 cm   Height Method Stated Stated Stated - -   Weight 141.522 kg 140.615 kg 136.1 kg 135 kg 135.5 kg   Weight Method Actual Stated - Actual Bed Scale   BMI (calculated) 38.1 kg/m2 37.8 kg/m2 36.6 kg/m2 - 36.4 kg/m2  Intake/Output Summary (Last 24 hours) at 05/31/18 0817  Last data filed at 05/30/18 1544   Gross per 24 hour   Intake           1150.4 ml   Output              725 ml   Net            425.4 ml     Body mass index is 36.36 kg/m.   Meds:   Current Facility-Administered Medications   Medication Dose Route Frequency   . acetaminophen  650 mg Oral Q4H   . amLODIPine  10 mg Oral Daily   . apixaban  5 mg Oral Q12H   . bisacodyl  5 mg Oral Daily   . carvedilol  6.25 mg Oral BID Meals   . glipiZIDE  10 mg Oral BID AC   . insulin lispro  2-16 Units Subcutaneous TID AC    And   . insulin lispro  1-9 Units Subcutaneous QHS   . losartan  50 mg Oral Daily   . metFORMIN  500 mg Oral BID Meals   . polyethylene glycol  17 g Oral BID   . SITagliptin  50 mg Oral BID   . sodium chloride (PF)  3 mL Intravenous Q8H   . tiZANidine  4 mg Oral BID   . traMADol  50 mg Oral 4 times per day       PRN Meds: acetaminophen, dextrose, diphenhydrAMINE, diphenhydrAMINE, glucagon (rDNA), hydrALAZINE, NSG Communication: Glucose POCT order (AC, HS) **AND** insulin lispro **AND** insulin lispro **AND** insulin lispro, morphine, naloxone, naloxone,  ondansetron, ondansetron **OR** ondansetron, oxyCODONE, oxyCODONE, promethazine **OR** promethazine **OR** promethazine.   LABS:   Estimated Creatinine Clearance: 132.5 mL/min (based on SCr of 0.88 mg/dL).    Recent Labs  Lab 05/31/18  0557 05/30/18  0448   WBC 10.1 10.3   RBC 2.48* 2.14*   Hemoglobin 8.3* 7.4*   Hematocrit 25.0* 22.0*   MCV 101* 103*   PLT CT 301 307       Recent Labs  Lab 05/28/18  0956   PT 11.1   PT INR 1.1         Lab Results   Component Value Date    HGBA1CPERCNT 5.5 05/19/2018       Recent Labs  Lab 05/30/18  0448 05/28/18  0956 05/27/18  0231   Glucose 126* 199* 224*   Sodium 132* 133* 137   Potassium 4.9 4.7 3.9   Chloride 101 99 99   CO2 26.5 26.7 25   BUN 22 25* 29*   Creatinine 0.88 0.89 1.20   EGFR 93 92 65   Calcium 7.9* 8.8 9.3               Invalid input(s):  AMORPHOUSUA   Patient Lines/Drains/Airways Status    Active PICC Line / CVC Line / PIV Line / Drain / Airway / Intraosseous Line / Epidural Line / ART Line / Line / Wound / Pressure Ulcer / NG/OG Tube     Name:   Placement date:   Placement time:   Site:   Days:    Peripheral IV 05/26/18 Right Antecubital  05/26/18    2130    Antecubital    less than 1    Incision Site 05/26/18 Hip Right  05/26/18    2300      less than 1  Xr Hip Right 2-3 Vw Without Pelvis    Result Date: 05/26/2018  Acute fracture involving the mid femur adjacent to the distal tip of the femoral prosthesis. ReadingStation:[email or web address]    Xr Knee Right 4+ Views    Result Date: 05/26/2018  1.  No acute fracture is identified.  ReadingStation:[id number]    Ct Hip Right Wo Contrast    Result Date: 05/27/2018  Postoperative change of right total hip replacement with right femur fracture involving the greater trochanteric region extending into the proximal and mid diaphyseal region of the right femur with lateral displacement of the fracture fragment distal to the shaft of the femoral prosthesis. Acetabular component appears in place with no acetabular  fracture identified. ReadingStation:[email or web address]    US Venous Leg Right    Result Date: 05/26/2018  No acute deep vein thrombosis is identified. ReadingStation:[id number]    Xr Femur Right 1 View (see Comments)    Result Date: 05/29/2018  1. Appropriate positioning of the new right femoral prosthesis. 2. Right femoral diaphyseal fracture immobilized with a longer stem and reinforced with 2 wire loops. ReadingStation:[email or web address]     Time spent:    Nutrition assessment done in collaboration with Registered Dietitians:        Alease Medina, MD     05/31/18,8:17 AM   MRN: [id number]                                      CSN: [id number] DOB: Jan 08, 1956

## 2018-05-31 NOTE — Plan of Care (Signed)
Problem: Pain interferes with ability to perform ADL  Goal: Pain at adequate level as identified by patient  Outcome: Progressing   05/31/18 0818   Goal/Interventions addressed this shift   Pain at adequate level as identified by patient Identify patient comfort function goal;Assess for risk of opioid induced respiratory depression, including snoring/sleep apnea. Alert healthcare team of risk factors identified.;Assess pain on admission, during daily assessment and/or before any "as needed" intervention(s);Reassess pain within 30-60 minutes of any procedure/intervention, per Pain Assessment, Intervention, Reassessment (AIR) Cycle;Evaluate if patient comfort function goal is met;Evaluate patient's satisfaction with pain management progress;Offer non-pharmacological pain management interventions;Consult/collaborate with Physical Therapy, Occupational Therapy, and/or Speech Therapy;Include patient/patient care companion in decisions related to pain management as needed

## 2018-06-01 ENCOUNTER — Inpatient Hospital Stay: Payer: BC Managed Care – PPO

## 2018-06-01 LAB — BASIC METABOLIC PANEL
Anion Gap: 12.7 mMol/L (ref 7.0–18.0)
BUN / Creatinine Ratio: 26.5 Ratio (ref 10.0–30.0)
BUN: 18 mg/dL (ref 7–22)
CO2: 24 mMol/L (ref 20–30)
Calcium: 8.8 mg/dL (ref 8.5–10.5)
Chloride: 101 mMol/L (ref 98–110)
Creatinine: 0.68 mg/dL — ABNORMAL LOW (ref 0.80–1.30)
EGFR: 103 mL/min/{1.73_m2} (ref 60–150)
Glucose: 168 mg/dL — ABNORMAL HIGH (ref 71–99)
Osmolality Calc: 272 mOsm/kg — ABNORMAL LOW (ref 275–300)
Potassium: 4.7 mMol/L (ref 3.5–5.3)
Sodium: 133 mMol/L — ABNORMAL LOW (ref 136–147)

## 2018-06-01 LAB — VH DEXTROSE STICK GLUCOSE
Glucose POCT: 159 mg/dL — ABNORMAL HIGH (ref 71–99)
Glucose POCT: 174 mg/dL — ABNORMAL HIGH (ref 71–99)
Glucose POCT: 169 mg/dL — ABNORMAL HIGH (ref 71–99)
Glucose POCT: 140 mg/dL — ABNORMAL HIGH (ref 71–99)
Glucose POCT: 137 mg/dL — ABNORMAL HIGH (ref 71–99)

## 2018-06-01 LAB — CBC
Hematocrit: 26 % — ABNORMAL LOW (ref 39.0–52.5)
Hemoglobin: 8.8 gm/dL — ABNORMAL LOW (ref 13.0–17.5)
MCH: 34 pg (ref 28–35)
MCHC: 34 gm/dL (ref 32–36)
MCV: 101 fL — ABNORMAL HIGH (ref 80–100)
MPV: 7.2 fL (ref 6.0–10.0)
PLT CT: 351 10*3/uL (ref 130–440)
RBC: 2.57 10*6/uL — ABNORMAL LOW (ref 4.00–5.70)
RDW: 17 % — ABNORMAL HIGH (ref 11.0–14.0)
WBC: 9.6 10*3/uL (ref 4.0–11.0)

## 2018-06-01 MED ORDER — VH BIO-K PLUS 50 BIL LIQUID
50.00 | Freq: Every day | ORAL | Status: DC
Start: 2018-06-01 — End: 2018-06-01
  Filled 2018-06-01: qty 104

## 2018-06-01 MED ORDER — VH BIO-K PLUS PROBIOTIC 50 BIL CFU CAPSULE
50.00 | DELAYED_RELEASE_CAPSULE | Freq: Every day | ORAL | Status: DC
Start: 2018-06-01 — End: 2018-06-03
  Administered 2018-06-01 – 2018-06-03 (×3): 50 via ORAL
  Filled 2018-06-01 (×3): qty 1

## 2018-06-01 NOTE — Progress Notes (Signed)
Medicine Progress Note - [facility]  [employer]   Patient Name: [name], [name] LOS: 6 days   Attending Physician: Alease Medina, MD PCP: Md, Out Of Network      Assessment and Plan:                                                                # post hemorrhagic anemia  - transfused 2 units pRBC- H/H stable  - resume BP meds     # Hyponatremia likely SIADH- improving  - CXR negative  - possibly from stress/surgery  - Trial of fluid restriction - 1200 ml a day + salt tablets    # Acute femur fracture, right periprosthetic  No evidence of acetabular abnormality on CT scan  S/p ORIF by Dr Emeline Darling POD1  Pain control     # Osteoarthritis of right hip   s/p right hip replacement per Dr Emeline Darling     #  Coronary artery disease  Continue beta-blockers    #  Atrial fibrillation/flutter- rate controlled  Continue Beta blocker  Resume Eliquis    #  Diabetes mellitus  Last HBA1c 5.5  Continue Glucotrol ; resume metformin + Januvia  Continue sliding scale     # Hypertension  hold home meds-Coreg, Losartan and Norvasc  Add prn hydralazine    Disposition: Acute rehab   DVT PPX:  SCDs  Code:  Full Code   Hospital Course   [name] is a 62 y.o. male who presents to the hospital with  Fall and leg pain, found to have fracture  He has h/o AF/flutter on eliquis, CAD s/p CABG, DM, HTN, had scheduled right hip arthroplasty on 6-17 for chronic arthritis, hen d/c , he was at home and today he stood up and his right knee gave up and he fell down, landed on his right side, then had significant right leg pain. Came to ER, here XR showed femur fracture below prosthesis level     EMR Generated Hospital Problem List      Subjective     Worked with PT  Pain controlled; no dizziness; feels ready for rehab  Tolerated PT      Objective   Physical Exam:     Vitals: T:98.6 F (37 C) (Axillary), BP:150/82, HR:85, RR:16, SaO2:96%    General: Patient is awake. In no acute distress.  HEENT: PERRL, EOMI, no  conjunctival drainage, vision is intact.  Neck: supple, no thyromegaly.  Chest: CTA bilaterally. No rhonchi, no wheezing. No use of accessory muscles.  CVS: normal rate and regular rhythm no murmurs, without JVD.  Abdomen: soft, non-tender, no guarding or rigidity, with normal bowel sounds.  Extremities: +1 pitting edema rt lower extremity, pulses palpable, no calf swelling and gross no deformity.  Skin: Warm, dry, no rash and no worrisome lesions.  NEURO: no motor or sensory deficits.  Psychiatric: alert, interactive, appropriate, normal affect.  Weight Monitoring 02/18/2018 05/06/2018 05/19/2018 05/26/2018 05/26/2018   Height 193 cm 193 cm 193 cm - 193 cm   Height Method Stated Stated Stated - -   Weight 141.522 kg 140.615 kg 136.1 kg 135 kg 135.5 kg   Weight Method Actual Stated - Actual Bed Scale   BMI (calculated) 38.1 kg/m2 37.8 kg/m2 36.6  kg/m2 - 36.4 kg/m2       Intake/Output Summary (Last 24 hours) at 06/01/18 0802  Last data filed at 05/31/18 2154   Gross per 24 hour   Intake             1525 ml   Output             1450 ml   Net               75 ml     Body mass index is 36.36 kg/m.   Meds:   Current Facility-Administered Medications   Medication Dose Route Frequency   . acetaminophen  650 mg Oral Q4H   . amLODIPine  10 mg Oral Daily   . apixaban  5 mg Oral Q12H   . bisacodyl  5 mg Oral Daily   . carvedilol  6.25 mg Oral BID Meals   . glipiZIDE  10 mg Oral BID AC   . insulin lispro  2-16 Units Subcutaneous TID AC    And   . insulin lispro  1-9 Units Subcutaneous QHS   . losartan  50 mg Oral Daily   . metFORMIN  500 mg Oral BID Meals   . polyethylene glycol  17 g Oral BID   . SITagliptin  50 mg Oral BID   . sodium chloride (PF)  3 mL Intravenous Q8H   . sodium chloride  1 g Oral TID MEALS   . tiZANidine  4 mg Oral BID   . traMADol  50 mg Oral 4 times per day       PRN Meds: acetaminophen, dextrose, diphenhydrAMINE, diphenhydrAMINE, glucagon (rDNA), hydrALAZINE, NSG Communication: Glucose POCT order (AC, HS)  **AND** insulin lispro **AND** insulin lispro **AND** insulin lispro, morphine, naloxone, naloxone, ondansetron, ondansetron **OR** ondansetron, oxyCODONE, oxyCODONE, promethazine **OR** promethazine **OR** promethazine.   LABS:   Estimated Creatinine Clearance: 171.5 mL/min (A) (based on SCr of 0.68 mg/dL (L)).    Recent Labs  Lab 06/01/18  0656 05/31/18  0557   WBC 9.6 10.1   RBC 2.57* 2.48*   Hemoglobin 8.8* 8.3*   Hematocrit 26.0* 25.0*   MCV 101* 101*   PLT CT 351 301       Recent Labs  Lab 05/28/18  0956   PT 11.1   PT INR 1.1         Lab Results   Component Value Date    HGBA1CPERCNT 5.5 05/19/2018       Recent Labs  Lab 06/01/18  0656 05/31/18  0557 05/30/18  0448   Glucose 168* 125* 126*   Sodium 133* 130* 132*   Potassium 4.7 4.3 4.9   Chloride 101 101 101   CO2 24 22.4 26.5   BUN 18 15 22    Creatinine 0.68* 0.71* 0.88   EGFR 103 101 93   Calcium 8.8 8.3* 7.9*               Invalid input(s):  AMORPHOUSUA   Patient Lines/Drains/Airways Status    Active PICC Line / CVC Line / PIV Line / Drain / Airway / Intraosseous Line / Epidural Line / ART Line / Line / Wound / Pressure Ulcer / NG/OG Tube     Name:   Placement date:   Placement time:   Site:   Days:    Peripheral IV 05/26/18 Right Antecubital  05/26/18    2130    Antecubital    less than 1    Incision Site  05/26/18 Hip Right  05/26/18    2300      less than 1               Xr Hip Right 2-3 Vw Without Pelvis    Result Date: 05/26/2018  Acute fracture involving the mid femur adjacent to the distal tip of the femoral prosthesis. ReadingStation:[id number]    Xr Knee Right 4+ Views    Result Date: 05/26/2018  1.  No acute fracture is identified.  ReadingStation:[id number]    Ct Hip Right Wo Contrast    Result Date: 05/27/2018  Postoperative change of right total hip replacement with right femur fracture involving the greater trochanteric region extending into the proximal and mid diaphyseal region of the right femur with lateral displacement of the fracture  fragment distal to the shaft of the femoral prosthesis. Acetabular component appears in place with no acetabular fracture identified. ReadingStation:[email or web address]    US Venous Leg Right    Result Date: 05/26/2018  No acute deep vein thrombosis is identified. ReadingStation:[id number]    Xr Femur Right 1 View (see Comments)    Result Date: 05/29/2018  1. Appropriate positioning of the new right femoral prosthesis. 2. Right femoral diaphyseal fracture immobilized with a longer stem and reinforced with 2 wire loops. ReadingStation:[email or web address]     Time spent:    Nutrition assessment done in collaboration with Registered Dietitians:        Alease Medina, MD     06/01/18,8:02 AM   MRN: [id number]                                      CSN: [id number] DOB: 1956-02-03

## 2018-06-01 NOTE — Plan of Care (Signed)
Problem: Compromised Tissue integrity  Goal: Damaged tissue is healing and protected  Outcome: Progressing   05/29/18 0213   Goal/Interventions addressed this shift   Damaged tissue is healing and protected  Monitor/assess Braden scale every shift;Increase activity as tolerated/progressive mobility;Keep intact skin clean and dry;Monitor patient's hygiene practices     Goal: Nutritional status is improving  Outcome: Progressing   06/01/18 0007   Goal/Interventions addressed this shift   Nutritional status is improving Allow adequate time for meals       Problem: Moderate/High Fall Risk Score >5  Goal: Patient will remain free of falls  Outcome: Progressing   05/30/18 2000 05/31/18 2000   Moderate Risk Falls Interventions (6-13)   VH Moderate Risk (6-13) --  ALL REQUIRED LOW INTERVENTIONS;INITIATE YELLOW "FALL RISK" SIGNAGE;YELLOW NON-SKID SLIPPERS;YELLOW "FALL RISK" ARM BAND;USE OF BED EXIT ALARM IF PATIENT IS CONFUSED OR IMPULSIVE. PLACE RESET BED ALARM SIGN ABOVE BED;PLACE FALL RISK LEVEL ON WHITE BOARD FOR COMMUNICATION PURPOSES IN PATIENT'S ROOM;Request PT/OT therapy consult order from Physician for patients with gait/mobility impairment;Use assistive devices;Use chair-pad alarm device;Use of "STOP ask for help" sign   High Risk Falls Interventions (Greater than 13)   VH High Risk (Greater than 13) ALL REQUIRED LOW INTERVENTIONS;ALL REQUIRED MODERATE INTERVENTIONS;RED "HIGH FALL RISK" SIGNAGE;BED ALARM WILL BE ACTIVATED WHEN THE PATEINT IS IN BED WITH SIGNAGE "RESET BED ALARM";A CHAIR PAD ALARM WILL BE USED WHEN PATIENT IS UP SITTING IN A CHAIR;PATIENT IS TO BE SUPERVISED FOR ALL TOILETING ACTIVITIES;Keep door open for better visibility;Include family/significant other in multidisciplinary discussion regarding plan of care as appropriate;Use assistive devices;Use of "STOP ask for help" sign;Use chair-pad alarm device --        Problem: Side Effects from Pain Analgesia  Goal: Patient will experience minimal side  effects of analgesic therapy  Outcome: Progressing   05/29/18 1925   Goal/Interventions addressed this shift   Patient will experience minimal side effects of analgesic therapy Monitor/assess patient's respiratory status (RR depth, effort, breath sounds);Assess for changes in cognitive function;Prevent/manage side effects per LIP orders (i.e. nausea, vomiting, pruritus, constipation, urinary retention, etc.);Evaluate for opioid-induced sedation with appropriate assessment tool (i.e. POSS)       Problem: Impaired Mobility  Goal: Mobility/Activity is maintained at optimal level for patient  Outcome: Progressing   05/30/18 0200   Goal/Interventions addressed this shift   Mobility/activity is maintained at optimal level for patient Increase mobility as tolerated/progressive mobility;Encourage independent activity per ability;Maintain proper body alignment;Perform active/passive ROM;Plan activities to conserve energy, plan rest periods;Reposition patient every 2 hours and as needed unless able to reposition self;Assess for changes in respiratory status, level of consciousness and/or development of fatigue       Problem: Compromised skin integrity  Goal: Skin integrity is maintained or improved  Outcome: Progressing   05/29/18 0213   Goal/Interventions addressed this shift   Skin integrity is maintained or improved Assess Braden Scale every shift;Keep skin clean and dry

## 2018-06-01 NOTE — Plan of Care (Signed)
Problem: Compromised Tissue integrity  Goal: Damaged tissue is healing and protected  Outcome: Progressing      Problem: Moderate/High Fall Risk Score >5  Goal: Patient will remain free of falls  Outcome: Progressing      Problem: Pain interferes with ability to perform ADL  Goal: Pain at adequate level as identified by patient  Outcome: Progressing   06/01/18 2045   Goal/Interventions addressed this shift   Pain at adequate level as identified by patient Identify patient comfort function goal;Assess for risk of opioid induced respiratory depression, including snoring/sleep apnea. Alert healthcare team of risk factors identified.;Assess pain on admission, during daily assessment and/or before any "as needed" intervention(s);Reassess pain within 30-60 minutes of any procedure/intervention, per Pain Assessment, Intervention, Reassessment (AIR) Cycle;Evaluate if patient comfort function goal is met;Evaluate patient's satisfaction with pain management progress;Offer non-pharmacological pain management interventions;Include patient/patient care companion in decisions related to pain management as needed       Problem: Impaired Mobility  Goal: Mobility/Activity is maintained at optimal level for patient  Outcome: Progressing   06/01/18 2045   Goal/Interventions addressed this shift   Mobility/activity is maintained at optimal level for patient Increase mobility as tolerated/progressive mobility;Encourage independent activity per ability;Maintain proper body alignment;Perform active/passive ROM;Plan activities to conserve energy, plan rest periods;Reposition patient every 2 hours and as needed unless able to reposition self;Assess for changes in respiratory status, level of consciousness and/or development of fatigue       Problem: Peripheral Neurovascular Impairment  Goal: Extremity color, movement, sensation are maintained or improved  Outcome: Progressing   06/01/18 2045   Goal/Interventions addressed this shift    Extremity color, movement, sensation are maintained or improved  Increase mobility as tolerated/progressive mobility;Assess extremity for proper alignment;Teach/review/reinforce ankle pump exercises;VTE Prevention: Administer anticoagulant(s) and/or apply anti-embolism stockings/devices as ordered

## 2018-06-01 NOTE — Progress Notes (Signed)
Pt rested well this shift. RLE dressing dry and intact, +CMS and +NV intact to RLE, pt has neuropathy as baseline. Pain controlled with Tylenol and UItram and Oxy IR. Pt on 1200 cc fluid restriction since 05/31/18 pm when ordered. Denies needs. Last BG 159, covered with 1 unit Humalog per ss. Call button within reach.

## 2018-06-01 NOTE — Plan of Care (Signed)
Problem: Pain interferes with ability to perform ADL  Goal: Pain at adequate level as identified by patient   06/01/18 1500   Goal/Interventions addressed this shift   Pain at adequate level as identified by patient Identify patient comfort function goal;Assess for risk of opioid induced respiratory depression, including snoring/sleep apnea. Alert healthcare team of risk factors identified.;Assess pain on admission, during daily assessment and/or before any "as needed" intervention(s);Reassess pain within 30-60 minutes of any procedure/intervention, per Pain Assessment, Intervention, Reassessment (AIR) Cycle;Evaluate if patient comfort function goal is met;Evaluate patient's satisfaction with pain management progress;Offer non-pharmacological pain management interventions;Consult/collaborate with Physical Therapy, Occupational Therapy, and/or Speech Therapy;Include patient/patient care companion in decisions related to pain management as needed

## 2018-06-01 NOTE — PT Eval Note (Addendum)
[facility]  Patient: [name]     CSN: [id number]    Bed: 402/402-A  Physical Therapy treatment  Visit#: 3   Treatment Frequency: 7x/wk  Last seen by a physical therapist vs. Physical therapist assistant: 06/01/2018     PT Assessment:  Prior to admission, patient was modified independent and was ambulating primarily with a SPC (occasionally with a FWW) at the household level and was requiring assistance for certain household duties.   Patient with right knee and hip pain with edema and effusion.  Hip externally rotated in supine, not able to reach neutral in session.  Patient independent bed mobility, primarily using upper extremities and left lower.  Able to stand with nonweight bearing right lower and scoot 1/2 foot to right and left with walker with mod A x 2.  Positioned with towel to support more neutral hip rotation at end of session with patient rotation to reach neutral activity.  Recommend acute care.    DISCHARGE RECOMMENDATIONS   Discharge Recommendations:   Acute Rehab       *Discharge recommendations are subject to change based on patient's progress and/or home support changes - please refer to most recent PT note for current recommendation    DME recommended for Discharge:   Has needed equipment    PMP (Progressive Mobility Program) Recommendations:   Recommend patient  transfer to chair/BSC 2-3 times/day with Wheeled walker and physical assist and/or supervision of 1 staff as tolerated.     Precautions and Contraindications:   Weight Bearing: Right LE Non weight bearing  Falls  Mobility protocol     PT Assessment and Plan of Care (Treatment frequency noted above):     HPI (per physician charting) and Pertinent Medical Details:  Admitted 05/26/2018 with/for R femoral ORIF due to femoral fracture sustained with fall. Patient is s/p R THA (05/19/18).     Goals:    In 2-5 visits  1. Patient will perform supine to/from sit transfer with minimal assistance demonstrating improved  functional mobility. ONGOING  2. Patient will perform sit to/from stand transfer with walker and minimal assistance demonstrating improved functional mobility for out of bed activities. MET  3. Patient will ambulate 5 feet with walker and minimal assistance in preparation for return to household ambulation distances. ONGOING  4. Patient will demonstrate independent compliance with weight bearing restrictions through out PT session during functional mobility. MET  5. Patient will perform LE HEP with supervision to increase RLE AROM and strength in prep for increased mobility.  ONGOING  6. Patient will perform sit to/from stand transfer with walker and SUPERVISION demonstrating improved functional mobility for out of bed activities. ONGOING      Patient presenting with the following PT Impairments:decreased ROM, decreased strength, decreased safety/judgement during functional mobility, decreased activity tolerance, decreased functional mobility, decreased balance, gait deficits, pain, decreased sensation    Patient will benefit from skilled PT services in order to address listed impairments and maximize functional mobility     Treatment/interventions: Exercise, Gait training, Stair training, Neuromuscular re-education, Functional transfer training, LE strengthening/ROM, Cognitive reorientation, Patient/caregiver training, Equipment eval/education, Bed mobility, Continued evaluation    Rehabilitation Potential:Good with ongoing PT upon discharge from acute care.  24 hour supervision recommended.    Discussed risk, benefits and Plan of Care with: Patient    *note: Clinical Presentation and Decision Making includes the following sections: Goals, PT assessment, treatment frequency and treatment/interventions):    History Based on physician charted EPIC/EMR information:  Medical Diagnosis: Contusion of right knee, initial encounter [S80.01XA]  Type 2 diabetes mellitus treated without insulin [E11.9]  Closed fracture of  shaft of right femur, unspecified fracture morphology, initial encounter [S72.301A]  Atrial flutter, unspecified type [I48.92]  Hypertension, unspecified type [I10]    Xr Chest 2 Views    Result Date: 06/01/2018  IMPRESSION: Minimal vascular prominence without congestive failure or focal infiltrate. ReadingStation:[id number]    Xr Hip Right 2-3 Vw Without Pelvis    Result Date: 05/26/2018  Acute fracture involving the mid femur adjacent to the distal tip of the femoral prosthesis. ReadingStation:[id number]    Xr Knee Right 4+ Views    Result Date: 05/26/2018  1.  No acute fracture is identified.  ReadingStation:[id number]    Ct Hip Right Wo Contrast    Result Date: 05/27/2018  Postoperative change of right total hip replacement with right femur fracture involving the greater trochanteric region extending into the proximal and mid diaphyseal region of the right femur with lateral displacement of the fracture fragment distal to the shaft of the femoral prosthesis. Acetabular component appears in place with no acetabular fracture identified. ReadingStation:[id number]    US Venous Leg Right    Result Date: 05/26/2018  No acute deep vein thrombosis is identified. ReadingStation:[id number]    Xr Femur Right 1 View (see Comments)    Result Date: 05/29/2018  1. Appropriate positioning of the new right femoral prosthesis. 2. Right femoral diaphyseal fracture immobilized with a longer stem and reinforced with 2 wire loops. ReadingStation:[id number]    Problem list:  Patient Active Problem List   Diagnosis   . Osteoarthritis of right hip   . Femur fracture, right   . CAD (coronary artery disease)   . AF (atrial fibrillation)   . DM (diabetes mellitus)   . HTN (hypertension)      Past Medical/Surgical History:  Past Medical History:   Diagnosis Date   . Atrial flutter    . CAD (coronary artery disease)    . Carpal tunnel syndrome     H/O   . DM2 (diabetes mellitus, type 2)    . ED (erectile dysfunction)    . Encounter for blood  transfusion    . Hypertension    . MI (myocardial infarction) '08, '11   . OA (osteoarthritis)    . Psoriasis       Past Surgical History:   Procedure Laterality Date   . ARTHROPLASTY, HIP, TOTAL, ANTERIOR APPROACH, C ARM Right 05/19/2018    Procedure: ARTHROPLASTY, HIP, TOTAL, ANTERIOR APPROACH, C ARM;  Surgeon: Len Childs, MD;  Location: [facility];  Service: Orthopedics;  Laterality: Right;  RIGHT TOTAL HIP REPLACEMENT, ASI    . ARTHROPLASTY, HIP, TOTAL, REVISION, ANTERIOR APPROACH, C ARM Right 05/29/2018    Procedure: ARTHROPLASTY, HIP, TOTAL, REVISION, ANTERIOR APPROACH, C ARM;  Surgeon: Len Childs, MD;  Location: [facility];  Service: Orthopedics;  Laterality: Right;  RT REVISION PROSTETIC TOTAL HIP    . BICEPS TENDON REPAIR Right    . CARDIOVERSION     . CORONARY ARTERY BYPASS GRAFT  2011   . CYST REMOVAL      RIGHT CHEEK   . INGUINAL HERNIA REPAIR Right     AS CHILD   . KNEE ARTHROTOMY Left 1975   . ORIF, RADIUS, DISTAL (WRIST) Right    . SINUS SURGERY         Subjective   I want to get better.  Patient/family/caregiver consent to  therapy session is noted by the participation in the therapy session.    Patient/caregiver goal for PT: return to previous levels of activity and independence.     Pain:  At Rest: 5/10  With Activity:10/10  Location: Hip:  right  Interventions: Medication (see eMAR), Repositioned, RN/LPN aware    Examination of Body Systems (Structures, Function, Activity and Participation)   Patient's medical condition is appropriate for Physical therapy intervention at this time    Observation of patient:  Patient is in bed with Bed/chair alarm on    Cognition:  Oriented to: Oriented x4  Command following: Follows ALL commands and directions without difficulty  Alertness/Arousal: Appropriate responses to stimuli   Attention Span:Appears intact  Memory: Appears intact    Vital Signs (Cardiovascular):  Vitals stable throughout            Balance:  Static  Sitting:  Good  Static Standing:  Good WITH WHEELED WALKER USE  Dynamic Standing:  Fair+ WITH WHEELED WALKER USE.          Functional Mobility:    Bed Mobility:  Supine to Sit:   Modified independent    Bed rail used,   Sit to Supine:   Modified independent      Bed rail used    Transfers:  Sit to Stand:  Minimal assist x2 with Front wheeled walker.    Cues for Hand Placement, Cues for Walker Management  Stand to Sit:  Supervision GOOD CONTROL.    Cues for Sequencing, Cues for Hand Placement, Cues for Walker Management    Locomotion:  Patient scooted foot to right and left for total 1 foot with walker and NWB right.  Slow movement with sitting rest between stance for exercise and stance for this gait.        Treatment Interventions this session:   Therapeutic activity  Patient/family/caregiver education  THEREX: emphasized quad sets x 10 and hip internal/external rotation "wiggles" with goal of reaching hip rotation to neutral position.  Limited heel slide and hip abduction in bed due to his rotated position, also limited in sitting.  Standing exercise with hip most neutral for hip abduction.  Seated knee extension for left.  For right, extended knee with heel touching floor and contract towards hip flex.  X 5 with assist.        Education Provided:   TOPICS: role of physical therapy, plan of care, goals of therapy and HEP, safety with mobility and ADLs, benefits of activity, weight bearing precautions, activity with nursing     Learner educated: Patient  Method: Explanation and Demonstration  Response to education: Verbalized understanding    Patient Position at End of Treatment:   Sitting, in the room, Needs in reach and No distress    Team Communication:     Spoke to: RN  Regarding: Pre-session re: patient status, Patient position at end of session, Discharge needs, Patient participation with Therapy, Vital signs, Further recommendations  Whiteboard updated: yes.  exercises  PT/PTA communication: via written note  and verbal communication as needed.    Time of treatment:  Time Calculation  PT Received On: 06/01/18  Start Time: 0910  Stop Time: 0955  Time Calculation (min): 45 min    Reynold Bowen, PT  ______________________________________________________________________

## 2018-06-02 ENCOUNTER — Inpatient Hospital Stay: Payer: BC Managed Care – PPO

## 2018-06-02 LAB — VH DEXTROSE STICK GLUCOSE
Glucose POCT: 187 mg/dL — ABNORMAL HIGH (ref 71–99)
Glucose POCT: 153 mg/dL — ABNORMAL HIGH (ref 71–99)
Glucose POCT: 171 mg/dL — ABNORMAL HIGH (ref 71–99)
Glucose POCT: 151 mg/dL — ABNORMAL HIGH (ref 71–99)

## 2018-06-02 LAB — CBC AND DIFFERENTIAL
Basophils %: 0 % (ref 0.0–3.0)
Basophils Absolute: 0 10*3/uL (ref 0.0–0.3)
Eosinophils %: 2 % (ref 0.0–7.0)
Eosinophils Absolute: 0.2 10*3/uL (ref 0.0–0.8)
Hematocrit: 28.7 % — ABNORMAL LOW (ref 39.0–52.5)
Hemoglobin: 9.8 gm/dL — ABNORMAL LOW (ref 13.0–17.5)
Lymphocytes Absolute: 1.7 10*3/uL (ref 0.6–5.1)
Lymphocytes: 14 % — ABNORMAL LOW (ref 15.0–46.0)
MCH: 35 pg (ref 28–35)
MCHC: 34 gm/dL (ref 32–36)
MCV: 102 fL — ABNORMAL HIGH (ref 80–100)
MPV: 7.3 fL (ref 6.0–10.0)
Monocytes Absolute: 0.8 10*3/uL (ref 0.1–1.7)
Monocytes: 7 % (ref 3.0–15.0)
Neutrophils %: 77 % (ref 42.0–78.0)
Neutrophils Absolute: 9.2 10*3/uL — ABNORMAL HIGH (ref 1.7–8.6)
PLT CT: 469 10*3/uL — ABNORMAL HIGH (ref 130–440)
RBC: 2.82 10*6/uL — ABNORMAL LOW (ref 4.00–5.70)
RDW: 17.4 % — ABNORMAL HIGH (ref 11.0–14.0)
WBC: 11.9 10*3/uL — ABNORMAL HIGH (ref 4.0–11.0)

## 2018-06-02 LAB — B-TYPE NATRIURETIC PEPTIDE: B-Natriuretic Peptide: 231 pg/mL — ABNORMAL HIGH (ref 0.0–100.0)

## 2018-06-02 LAB — BASIC METABOLIC PANEL
Anion Gap: 13.6 mMol/L (ref 7.0–18.0)
BUN / Creatinine Ratio: 20.7 Ratio (ref 10.0–30.0)
BUN: 17 mg/dL (ref 7–22)
CO2: 24.1 mMol/L (ref 20.0–30.0)
Calcium: 9.2 mg/dL (ref 8.5–10.5)
Chloride: 102 mMol/L (ref 98–110)
Creatinine: 0.82 mg/dL (ref 0.80–1.30)
EGFR: 95 mL/min/{1.73_m2} (ref 60–150)
Glucose: 178 mg/dL — ABNORMAL HIGH (ref 71–99)
Osmolality Calc: 276 mOsm/kg (ref 275–300)
Potassium: 4.7 mMol/L (ref 3.5–5.3)
Sodium: 135 mMol/L — ABNORMAL LOW (ref 136–147)

## 2018-06-02 MED ORDER — OXYCODONE HCL 5 MG PO TABS
5.00 mg | ORAL_TABLET | ORAL | Status: DC | PRN
Start: 2018-06-02 — End: 2018-06-03

## 2018-06-02 MED ORDER — TRIAMCINOLONE ACETONIDE 40 MG/ML IJ SUSP
40.00 mg | Freq: Once | INTRAMUSCULAR | Status: AC
Start: 2018-06-02 — End: 2018-06-02
  Administered 2018-06-02: 14:00:00 40 mg via INTRA_ARTICULAR
  Filled 2018-06-02: qty 1

## 2018-06-02 MED ORDER — LIDOCAINE HCL (PF) 1 % IJ SOLN
50.00 mg | Freq: Once | INTRAMUSCULAR | Status: AC
Start: 2018-06-02 — End: 2018-06-02
  Administered 2018-06-02: 14:00:00 50 mg via INTRA_ARTICULAR
  Filled 2018-06-02 (×2): qty 5

## 2018-06-02 MED ORDER — LIDOCAINE HCL (PF) 1 % IJ SOLN
50.00 mg | Freq: Once | INTRAMUSCULAR | Status: DC
Start: 2018-06-02 — End: 2018-06-02
  Filled 2018-06-02 (×2): qty 5

## 2018-06-02 MED ORDER — OXYCODONE HCL 5 MG PO TABS
10.00 mg | ORAL_TABLET | ORAL | Status: DC | PRN
Start: 2018-06-02 — End: 2018-06-03
  Administered 2018-06-02 – 2018-06-03 (×8): 10 mg via ORAL
  Filled 2018-06-02 (×8): qty 2

## 2018-06-02 NOTE — Plan of Care (Signed)
Problem: Hip Surgery  Goal: Free from Infection  Outcome: Progressing   06/02/18 1533   Goal/Interventions addressed this shift   Free from infection Monitor/assess vital signs;Maintain temperature within desired parameters;Assess for signs and symptoms of infection;Assess surgical dressing, reinforce or change as needed per order;Teach/reinforce use of incentive spirometer 10 times per hour while awake, cough and deep breath as needed;Administer and discontinue antibiotics per SCIP measures     Goal: Neurovascular Status is Stable  Outcome: Progressing   06/02/18 1533   Goal/Interventions addressed this shift   Neurovascular status is stable  Assess and document plantar/dorsiflexion;VTE prevention: administer anticoagulant(s) and/or apply anti-embolism stockings/devices as ordered;Monitor/assess neurovascular status (pulses, capillary refill, pain, paresthesia, presence of edema)

## 2018-06-02 NOTE — Progress Notes (Signed)
[facility] PROGRESS NOTE    Date Time: 06/02/18 9:20 AM  Patient Name: [name],[name]      Assessment:   status post open reduction internal fixation right periprosthetic femur fracture with longstem femur implant    Plan:    Weight bearing status: Nonweightbearing right lower extremity   Precautions: Anterior hip, fall   PT/OT: Out of bed   Anticoagulation: Apixaban starting today   Follow up: CBC stable   rexray knee, if no fx, will inject with kenalog for knee pain interfering with PT progress   Rehab when medically stable      Subjective:   Painful overnight     Medications:     Current Facility-Administered Medications   Medication Dose Route Frequency   . acetaminophen  650 mg Oral Q4H   . amLODIPine  10 mg Oral Daily   . apixaban  5 mg Oral Q12H   . bisacodyl  5 mg Oral Daily   . carvedilol  6.25 mg Oral BID Meals   . glipiZIDE  10 mg Oral BID AC   . insulin lispro  2-16 Units Subcutaneous TID AC    And   . insulin lispro  1-9 Units Subcutaneous QHS   . lactobacillus species  50 Billion CFU Oral Daily   . losartan  50 mg Oral Daily   . metFORMIN  500 mg Oral BID Meals   . polyethylene glycol  17 g Oral BID   . SITagliptin  50 mg Oral BID   . sodium chloride (PF)  3 mL Intravenous Q8H   . sodium chloride  1 g Oral TID MEALS   . tiZANidine  4 mg Oral BID   . traMADol  50 mg Oral 4 times per day   . triamcinolone acetonide  40 mg Intra-articular Once         Physical Exam:     Vitals:    06/02/18 0726   BP: 161/90   Pulse: (!) 107   Resp: 19   Temp: 97.7 F (36.5 C)   SpO2: 97%       Intake and Output Summary (Last 24 hours) at Date Time    Intake/Output Summary (Last 24 hours) at 06/02/18 0920  Last data filed at 06/02/18 0500   Gross per 24 hour   Intake              720 ml   Output             2050 ml   Net            -1330 ml         Neurological -  motor and sensory grossly normal  Musculoskeletal - SILT s/s/ta/dp/sp, toes wawp, +df/pf toes and ankle    Incision-  dressing with ss drainage    Labs:     Results     Procedure Component Value Units Date/Time    Dextrose Stick Glucose [[id number]]  (Abnormal) Collected:  06/02/18 0722    Specimen:  Blood Updated:  06/02/18 0741     Glucose, POCT 187 (H) mg/dL     B-type Natriuretic Peptide [[id number]]  (Abnormal) Collected:  06/02/18 0539    Specimen:  Blood Updated:  06/02/18 0740     B-Natriuretic Peptide 231.0 (H) pg/mL     Dextrose Stick Glucose [[id number]]  (Abnormal) Collected:  06/01/18 2059    Specimen:  Blood Updated:  06/01/18 2115     Glucose, POCT 137 (H) mg/dL  Dextrose Stick Glucose [[id number]]  (Abnormal) Collected:  06/01/18 1636    Specimen:  Blood Updated:  06/01/18 1653     Glucose, POCT 140 (H) mg/dL     Dextrose Stick Glucose [[id number]]  (Abnormal) Collected:  06/01/18 1131    Specimen:  Blood Updated:  06/01/18 1151     Glucose, POCT 169 (H) mg/dL           Rads:   No results found.    Signed by: Len Childs, MD

## 2018-06-02 NOTE — OT Progress Note (Signed)
[facility]: [facility]  Department of Rehabilitation Services: [phone]    Georg Ang    CSN#: [id number]  ORTHOPAEDICS 402/402-A    Occupational Therapy Progress Note    Patient's medical condition is appropriate for Occupational therapy intervention at this time.    Time of treatment:   Time Calculation  OT Received On: 06/02/18  Start Time: 1314  Stop Time: 1329  Time Calculation (min): 15 min    Visit#: 2    Medical Diagnosis/Pertinent medical/surgical details: displaced right periprosthetic fracture about the total hip arthroplasty stem with no loosening of the acetabular component s/p open reduction and internal fixation with cables and placement of a long-stem implant, OA, a-fib, DM, HTN    Precautions and Contraindications:   Falls  Hip Precautions/ ROM Restrictions: AVOID combination of hip hyperextension and external rotation (Order [id number])   Weight bearing with restrictions- Lower Extremities (Order [id number]) LLE FWB; RLE NWB    Assessment:   [name] is progressing well with LB dressing and functional transfers. He was better able to maintain his NWB status today with occasional toe touch for balance upon initial standing. Would benefit from continued skilled occupational therapy services for below noted impairments.    Patient continues to have the following impairments: decreased independence with ADLs, decreased independence with IADLs     Goals:   STGs: (3 sessions)  1. Pt will don pants/undergarment with min A using AE PRN in prep for regaining skills to maximize functional independence. DISCONTINUED to reflect current need  2. Pt will don/doff socks with min A using AE PRN in prep for regaining skills to maximize functional independence. MET   3. Pt will be min A for BSC transfers in prep for regaining skills to maximize functional independence. ONGOING  4. Pt will don pants/undergarment from knees to hips with min A using AE PRN in prep for regaining skills to  maximize functional independence NEW GOAL  5. Pt will don shoes from home with min A using AE PRN sitting EOB. NEW GOAL    LTG: (By time of discharge)  Pt will complete self-care tasks & BR transfers with setup/supervision in prep for regaining skills to maximize functional independence. NEW GOAL    Plan:   Treatment/interventions: ADL retraining, functional transfer training    Treatment Frequency: OT Frequency Recommended: 4-5x/wk     DISCHARGE RECOMMENDATIONS   DME recommended for Discharge:   Long handled shoe horn  Long handled sponge  Wheelchair - manual    Discharge Recommendations:   Inpatient rehab    Subjective:   Patient stated he has his reacher in the room.    Patient is agreeable to participation in the therapy session. Nursing clears patient for therapy.     Pain:  At Rest: 0 /10, With Activity: 5/10, Location: Leg:  right , Interventions: Medication (see eMAR)          Objective:   Observation of Patient:    Patient is in bed with dressings in place. Patient seen with PT present.      Vital Signs:   See flowsheet between start time and stop times for vitals taken during session      Oriented to: Oriented x4  Command following: Follows 1 step commands without difficulty  Alertness/Arousal: Appropriate responses to stimuli   Attention Span:Appears intact  Behavior: Cooperative    [employer] Balance Scale  3+/5  Patient sits without upper extremity support for 30 seconds or greater.  [employer] Standing Balance Scale  1+/5  Patient supports self with upper extremities but requires therapist assistance. Patient performs >50% of effort. (Minimal assist).    Activities of Daily Living:   LB Dressing: supervision to doff/don left sock seated EOB with supervision and the use of a sock aid and reacher. Supervision to don shorts (although backwards) sitting EOB from feet to knees. Dependent to pull shorts from knees to hips standing with FWW    Functional Mobility:   Bed  Mobility:  Supine to Sit:   Supervision.   Cues for Sequencing., Cues for Hand placement., HOB elevated, Bed rail used    Transfers:  Sit to Stand from bed:  Minimal assist with Front wheeled walker.        Stand to Sit on wheelchair:  Minimal assist.   Cues for Sequencing, Cues for Hand Placement  Functional mobility/ambulation: Minimal assist with Front wheeled walker.       Activity Tolerance:   Activity Tolerance: Tolerates 10-20 minutes of activity without rest breaks    Other Treatment Interventions:   Treatment Activities:   Not applicable          Education Provided:   Topics: Role of occupational therapy, plan of care, goals of therapy and safety with mobility and ADLs, benefits of activity, discharge instructions, home safety.    Individuals educated: Patient.  Method: Explanation.  Response to education: Verbalized understanding.    Team Communication:   OT communicated with: RN/LPN   OT communicated regarding: Pre-session re: patient status, Patient position at end of session, Patient participation with Therapy  OT/COTA communication: via written note and verbal communication as needed.    Patient left Sitting in a w/c with Staff present: PT and in No distress in place.    Recommend patient to be OOB for all meals & toileting needs outside of and in addition to OT session.    Allie Dimmer, MS, OTR/L

## 2018-06-02 NOTE — Plan of Care (Signed)
Problem: Pain interferes with ability to perform ADL  Goal: Pain at adequate level as identified by patient  Outcome: Progressing   06/01/18 2045   Goal/Interventions addressed this shift   Pain at adequate level as identified by patient Identify patient comfort function goal;Assess for risk of opioid induced respiratory depression, including snoring/sleep apnea. Alert healthcare team of risk factors identified.;Assess pain on admission, during daily assessment and/or before any "as needed" intervention(s);Reassess pain within 30-60 minutes of any procedure/intervention, per Pain Assessment, Intervention, Reassessment (AIR) Cycle;Evaluate if patient comfort function goal is met;Evaluate patient's satisfaction with pain management progress;Offer non-pharmacological pain management interventions;Include patient/patient care companion in decisions related to pain management as needed       Problem: Side Effects from Pain Analgesia  Goal: Patient will experience minimal side effects of analgesic therapy  Outcome: Progressing   05/29/18 1925   Goal/Interventions addressed this shift   Patient will experience minimal side effects of analgesic therapy Monitor/assess patient's respiratory status (RR depth, effort, breath sounds);Assess for changes in cognitive function;Prevent/manage side effects per LIP orders (i.e. nausea, vomiting, pruritus, constipation, urinary retention, etc.);Evaluate for opioid-induced sedation with appropriate assessment tool (i.e. POSS)

## 2018-06-02 NOTE — Procedures (Signed)
Under sterile conditions 40 mg of Kenalog and 2 mL's of 1% lidocaine without epinephrine were injected into the right knee for knee effusion and pain.  The patient tolerated the procedure well.

## 2018-06-02 NOTE — Progress Notes (Signed)
Quick Doc  [facility] - ORTHOPAEDICS   Patient Name: [name]   Attending Physician: Alease Medina, MD   Today's date:   06/02/2018 LOS: 7 days   Expected Discharge Date Expected Discharge Date: 06/02/18    Quick  Assessment:                                                              CM Comments: 06/02/18 DCP: [facility] is reviewing. Waiting for OT note before case can be opened with insurance. OT note now on chart. Relayed this to [facility]. Updated patient. DCP will continue to follow.                                                                                       Provider Notifications:      Judd Gaudier, [facility]  Discharge Planner Social Worker  [phone]

## 2018-06-02 NOTE — Progress Notes (Addendum)
Medicine Progress Note - 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  - CXR negative  - possibly from stress/surgery  - change fluid restriction - 1500 ml a day + wean down salt tablets to once a day    # Acute femur fracture, right periprosthetic  No evidence of acetabular abnormality on CT scan  S/p ORIF by Dr Emeline Darling POD 3  Pain control     # Osteoarthritis of right hip   s/p right hip replacement per Dr Emeline Darling     #  Coronary artery disease  Continue beta-blockers    #  Atrial fibrillation/flutter- rate controlled  Continue Beta blocker  Resumed Eliquis    #  Diabetes mellitus  Last HBA1c 5.5  Continue Glucotrol ; resumed metformin + Januvia  Continue sliding scale     # Hypertension  hold home meds-Coreg, Losartan and Norvasc  Add prn hydralazine    Disposition: Acute rehab; medically ready for discharge  DVT PPX:  SCDs  Code:  Full Code   Hospital Course   [name] is a 62 y.o. male who presents to the hospital with  Fall and leg pain, found to have fracture  He has h/o AF/flutter on eliquis, CAD s/p CABG, DM, HTN, had scheduled right hip arthroplasty on 6-17 for chronic arthritis, hen d/c , he was at home and today he stood up and his right knee gave up and he fell down, landed on his right side, then had significant right leg pain. Came to ER, here XR showed femur fracture below prosthesis level     EMR Generated Hospital Problem List      Subjective   Worked with PT  Pain controlled after steroid knee injection; no fever      Objective   Physical Exam:     Vitals: T:97.7 F (36.5 C) (Axillary), BP:115/59, HR:86, RR:19, SaO2:100%    General: Patient is awake. In no acute  distress.  HEENT: PERRL, EOMI, no conjunctival drainage, vision is intact.  Neck: supple, no thyromegaly.  Chest: CTA bilaterally. No rhonchi, no wheezing. No use of accessory muscles.  CVS: normal rate and regular rhythm no murmurs, without JVD.  Abdomen: soft, non-tender, no guarding or rigidity, with normal bowel sounds.  Extremities: +1 pitting edema rt lower extremity, pulses palpable, no calf swelling and gross no deformity.  Skin: Warm, dry, no rash and no worrisome lesions.  NEURO: no motor or sensory deficits.  Psychiatric: alert, interactive, appropriate, normal affect.  Weight Monitoring 02/18/2018 05/06/2018 05/19/2018 05/26/2018 05/26/2018   Height 193 cm 193 cm 193 cm - 193 cm   Height Method Stated Stated Stated - -   Weight 141.522 kg 140.615 kg 136.1 kg 135 kg 135.5 kg   Weight Method Actual Stated - Actual Bed Scale   BMI (calculated) 38.1 kg/m2  37.8 kg/m2 36.6 kg/m2 - 36.4 kg/m2       Intake/Output Summary (Last 24 hours) at 06/02/18 1407  Last data filed at 06/02/18 0500   Gross per 24 hour   Intake              240 ml   Output             1150 ml   Net             -910 ml     Body mass index is 36.36 kg/m.   Meds:   Current Facility-Administered Medications   Medication Dose Route Frequency   . acetaminophen  650 mg Oral Q4H   . amLODIPine  10 mg Oral Daily   . apixaban  5 mg Oral Q12H   . bisacodyl  5 mg Oral Daily   . carvedilol  6.25 mg Oral BID Meals   . glipiZIDE  10 mg Oral BID AC   . insulin lispro  2-16 Units Subcutaneous TID AC    And   . insulin lispro  1-9 Units Subcutaneous QHS   . lactobacillus species  50 Billion CFU Oral Daily   . lidocaine (PF)  50 mg Intra-articular Once   . losartan  50 mg Oral Daily   . metFORMIN  500 mg Oral BID Meals   . polyethylene glycol  17 g Oral BID   . SITagliptin  50 mg Oral BID   . sodium chloride (PF)  3 mL Intravenous Q8H   . tiZANidine  4 mg Oral BID   . traMADol  50 mg Oral 4 times per day   . triamcinolone acetonide  40 mg Intra-articular Once        PRN Meds: acetaminophen, dextrose, diphenhydrAMINE, diphenhydrAMINE, glucagon (rDNA), hydrALAZINE, NSG Communication: Glucose POCT order (AC, HS) **AND** insulin lispro **AND** insulin lispro **AND** insulin lispro, morphine, naloxone, ondansetron, ondansetron **OR** ondansetron, oxyCODONE, oxyCODONE, promethazine **OR** promethazine **OR** promethazine.   LABS:   Estimated Creatinine Clearance: 142.2 mL/min (based on SCr of 0.82 mg/dL).    Recent Labs  Lab 06/02/18  1032 06/01/18  0656   WBC 11.9* 9.6   RBC 2.82* 2.57*   Hemoglobin 9.8* 8.8*   Hematocrit 28.7* 26.0*   MCV 102* 101*   PLT CT 469* 351       Recent Labs  Lab 05/28/18  0956   PT 11.1   PT INR 1.1         Lab Results   Component Value Date    HGBA1CPERCNT 5.5 05/19/2018       Recent Labs  Lab 06/02/18  1032 06/01/18  0656 05/31/18  0557   Glucose 178* 168* 125*   Sodium 135* 133* 130*   Potassium 4.7 4.7 4.3   Chloride 102 101 101   CO2 24.1 24 22.4   BUN 17 18 15    Creatinine 0.82 0.68* 0.71*   EGFR 95 103 101   Calcium 9.2 8.8 8.3*               Invalid input(s):  AMORPHOUSUA   Patient Lines/Drains/Airways Status    Active PICC Line / CVC Line / PIV Line / Drain / Airway / Intraosseous Line / Epidural Line / ART Line / Line / Wound / Pressure Ulcer / NG/OG Tube     Name:   Placement date:   Placement time:   Site:   Days:    Peripheral IV 05/26/18 Right Antecubital  05/26/18  2130    Antecubital    less than 1    Incision Site 05/26/18 Hip Right  05/26/18    2300      less than 1               Xr Chest 2 Views    Result Date: 06/01/2018  IMPRESSION: Minimal vascular prominence without congestive failure or focal infiltrate. ReadingStation:[email or web address]    Xr Hip Right 2-3 Vw Without Pelvis    Result Date: 05/26/2018  Acute fracture involving the mid femur adjacent to the distal tip of the femoral prosthesis. ReadingStation:[email or web address]    Xr Knee 1 Or 2 Views Right    Result Date: 06/02/2018  1. Small suprapatellar joint effusion and diffuse soft tissue  swelling of the thigh without acute bony pathology. ReadingStation:[email or web address]    Xr Knee Right 4+ Views    Result Date: 05/26/2018  1.  No acute fracture is identified.  ReadingStation:[id number]    Ct Hip Right Wo Contrast    Result Date: 05/27/2018  Postoperative change of right total hip replacement with right femur fracture involving the greater trochanteric region extending into the proximal and mid diaphyseal region of the right femur with lateral displacement of the fracture fragment distal to the shaft of the femoral prosthesis. Acetabular component appears in place with no acetabular fracture identified. ReadingStation:[email or web address]    US Venous Leg Right    Result Date: 05/26/2018  No acute deep vein thrombosis is identified. ReadingStation:[id number]    Xr Femur Right 1 View (see Comments)    Result Date: 05/29/2018  1. Appropriate positioning of the new right femoral prosthesis. 2. Right femoral diaphyseal fracture immobilized with a longer stem and reinforced with 2 wire loops. ReadingStation:[email or web address]     Time spent:    Nutrition assessment done in collaboration with Registered Dietitians:        Alease Medina, MD     06/02/18,2:07 PM   MRN: [id number]                                      CSN: [id number] DOB: 1956-09-16

## 2018-06-02 NOTE — Progress Notes (Signed)
Pt rested in between care.  Pain was controlled with oral medications.  Dressings are dry and intact.  Pt is voiding WNL in the urinal.  Neuro-vascular assessment is intact.  Vital  signs reviewed:    Temp: 98.8 F (37.1 C), Heart Rate: 83, Resp Rate: 18, BP: 133/61  Call bell within reach.  Will continue to monitor pt on rounds.

## 2018-06-02 NOTE — Discharge Instr - AVS First Page (Addendum)
   FULL CODE.    Admitted on 6/24 S\P fall with right peri prosthetic femur fracture. S\P RTH on 6/17.    6/26 ORIF with cables and RTH revision by Dr. Lucretia Kern.    Physical \ Occupational Therapy evaluate and treat.    Up with assist of 1, NWB.    Follow instructions for dressing changes as directed by Dr. Emeline Darling.    Pt is on Eliquis for DVT prophylaxis.    Alert and oriented x4, follows commands.    Continent of bowel and bladder.    Last BM on 06/02/2018.    Skin intact with bruising noted to RLE.    Call with any and all concerns or with symptoms not normal for the patient.

## 2018-06-02 NOTE — UM Notes (Signed)
[facility] Utilization Management Review Sheet    Facility :  [facility]    NAME: [name]  MR#: [id number]    CSN#: [id number]    ROOM: 402/402-A AGE: 62 y.o.    ADMIT DATE AND TIME: 05/26/2018  7:01 PM      PATIENT CLASS: Inpatient     ATTENDING PHYSICIAN: Luis Medina, MD  PAYOR:Payor: OUT OF STATE BLUE CROSS / Plan: BCBS OUT OF STATE PPO / Product Type: BCBS /       AUTH #: [id number]    DIAGNOSIS:     ICD-10-CM    1. Closed fracture of shaft of right femur, unspecified fracture morphology, initial encounter S72.301A    2. Contusion of right knee, initial encounter S80.01XA    3. Hypertension, unspecified type I10    4. Type 2 diabetes mellitus treated without insulin E11.9    5. Atrial flutter, unspecified type I48.92        HISTORY:   Past Medical History:   Diagnosis Date   . Atrial flutter    . CAD (coronary artery disease)    . Carpal tunnel syndrome     H/O   . DM2 (diabetes mellitus, type 2)    . ED (erectile dysfunction)    . Encounter for blood transfusion    . Hypertension    . MI (myocardial infarction) '08, '11   . OA (osteoarthritis)    . Psoriasis        DATE OF REVIEW: 06/02/2018    VITALS: BP 115/59   Pulse 86   Temp 97.7 F (36.5 C) (Axillary)   Resp 19   Ht 1.93 m (6\' 4" )   Wt 135.5 kg (298 lb 11.6 oz)   SpO2 100%   BMI 36.36 kg/m     Active Hospital Problems    Diagnosis   . Femur fracture, right   . CAD (coronary artery disease)   . AF (atrial fibrillation)   . DM (diabetes mellitus)   . HTN (hypertension)   . Osteoarthritis of right hip     Patient remains in house under inpatient status    Most recent ortho MD note: "  Assessment:   status post open reduction internal fixation right periprosthetic femur fracture with longstem femur implant    Plan:    Weight bearing status: Nonweightbearing right lower extremity   Precautions: Anterior hip, fall   PT/OT: Out of bed   Anticoagulation: Apixaban starting today   Follow up: CBC stable   rexray knee, if no fx,  will inject with kenalog for knee pain interfering with PT progress   Rehab when medically stable      Subjective:   Painful overnight "  -------------------------------------------------    07/01 short Ortho MD note: "Under sterile conditions 40 mg of Kenalog and 2 mL's of 1% lidocaine without epinephrine were injected into the right knee for knee effusion and pain.  The patient tolerated the procedure well."      Medical MD most recent note: "  Assessment and Plan:                                                                # post hemorrhagic anemia  - transfused 2 units pRBC- H/H stable  -  resume BP meds     # Hyponatremia likely SIADH- improving  - CXR negative  - possibly from stress/surgery  - Trial of fluid restriction - 1200 ml a day + salt tablets    # Acute femur fracture, right periprosthetic  No evidence of acetabular abnormality on CT scan  S/pORIF by Dr [name] POD1  Pain control    #Osteoarthritis of right hip  s/p right hip replacement per Dr [name]     # Coronary artery disease  Continue beta-blockers    # Atrial fibrillation/flutter- rate controlled  Continue Beta blocker  Resume Eliquis    #Diabetes mellitus  Last HBA1c 5.5  Continue Glucotrol ; resume metformin + Januvia  Continue sliding scale    #Hypertension  hold home meds-Coreg, Losartan and Norvasc  Add prn hydralazine    Disposition: Acute rehab   DVT PPX:  SCDs  Code:            Full Code   Hospital Course   [name] is a 62 y.o. male who presents to the hospital with Fall and leg pain, found to have fracture  He has h/o AF/flutter on eliquis, CAD s/p CABG, DM, HTN, had scheduled right hip arthroplasty on 6-17 for chronic arthritis, hen d/c , he was at home and today he stood up and his right knee gave up and he fell down, landed on his right side, then had significant right leg pain. Came to ER, here XR showed femur fracture below prosthesis level"    -------------------------------------------------------------    Labs: (most recent) glucose 187,  WBC 11.9, H&H 9.8/28.7, RBC 2.82, platelets 469    Right knee xray 07/01: "1. Small suprapatellar joint effusion and diffuse soft tissue swelling of the thigh without acute bony pathology."    meds (current) tylenol q4h, norvasc qd, eliquis q12h, dulcolax qd, coreg bid, glipizide bid, insulin pens, bio-k plus qd, losartan qd, miralax bid, januvia bid, zanaflex bid, ultram qid, insulin per ss, morphine iv x 1 today, roxicodone x 4 on 06/29 and x 3 on 06/30 and x 3 today,     Seeking bed at acute short term rehab facility    Luis Critchley RN  [facility]  Utilization Review  P [phone]  F [phone]

## 2018-06-02 NOTE — PT Progress Note (Signed)
[facility]  Patient: [name]     CSN: [id number]    Bed: 402/402-A  Physical Therapy PROGRESS NOTE  Visit#: 4   Treatment Frequency: 4-5x/wk  Last seen by a physical therapist vs. Physical therapist assistant: 06/02/2018     PT Assessment:  PROGRESSED TO AMB WITH WHEELED WALKER. MAINTAINED NWB RLE. SUPINE TO SIT WITH SUPERVISION. PATIENT USED LLE TO ASSIST RLE OOB. MIN A FOR SIT TO SUPINE. MIN A FOR TRANSFERS AND AMB USING WHEELED WALKER. STILL LIMITED IN RLE AROM WITH PERSISTENT PAIN AND EDEMA.    DISCHARGE RECOMMENDATIONS   Discharge Recommendations:   Acute Rehab       *Discharge recommendations are subject to change based on patient's progress and/or home support changes - please refer to most recent PT note for current recommendation    DME recommended for Discharge:   Has needed equipment    PMP (Progressive Mobility Program) Recommendations:   Recommend patient  transfer to chair/BSC 2-3 times/day with Wheeled walker and physical assist and/or supervision of 1 staff as tolerated.     Precautions and Contraindications:   Weight Bearing: Right LE Non weight bearing  Falls  Mobility protocol     PT Assessment and Plan of Care (Treatment frequency noted above):     HPI (per physician charting) and Pertinent Medical Details:  Admitted 05/26/2018 with/for R femoral ORIF due to femoral fracture sustained with fall. Patient is s/p R THA (05/19/18).     Goals:    In 2-5 visits  1. Patient will perform supine to/from sit transfer with minimal assistance demonstrating improved functional mobility. MET  2. Patient will perform sit to/from stand transfer with walker and minimal assistance demonstrating improved functional mobility for out of bed activities. MET  3. Patient will ambulate 5 feet with walker and minimal assistance in preparation for return to household ambulation distances. MET  4. Patient will demonstrate independent compliance with weight bearing restrictions through out PT  session during functional mobility. MET  5. Patient will perform LE HEP with supervision to increase RLE AROM and strength in prep for increased mobility.  ONGOING  6. Patient will perform sit to/from stand transfer with walker and SUPERVISION demonstrating improved functional mobility for out of bed activities. ONGOING  7. Patient will perform supine to/from sit transfer with SUPERVISION demonstrating improved functional mobility. NEW  8. Patient will ambulate 50 feet with walker and SUPERVISION in preparation for return to household ambulation distances. NEW      Patient presenting with the following PT Impairments:decreased ROM, decreased strength, decreased safety/judgement during functional mobility, decreased activity tolerance, decreased functional mobility, decreased balance, gait deficits, pain, decreased sensation    Patient will benefit from skilled PT services in order to address listed impairments and maximize functional mobility     Treatment/interventions: Exercise, Gait training, Stair training, Neuromuscular re-education, Functional transfer training, LE strengthening/ROM, Cognitive reorientation, Patient/caregiver training, Equipment eval/education, Bed mobility, Continued evaluation    Rehabilitation Potential:Good with ongoing PT upon discharge from acute care.  24 hour supervision recommended.    Discussed risk, benefits and Plan of Care with: Patient    *note: Clinical Presentation and Decision Making includes the following sections: Goals, PT assessment, treatment frequency and treatment/interventions):    History Based on physician charted EPIC/EMR information:     Medical Diagnosis: Contusion of right knee, initial encounter [S80.01XA]  Type 2 diabetes mellitus treated without insulin [E11.9]  Closed fracture of shaft of right femur, unspecified fracture morphology, initial  encounter [S72.301A]  Atrial flutter, unspecified type [I48.92]  Hypertension, unspecified type [I10]    Xr Chest 2  Views    Result Date: 06/01/2018  IMPRESSION: Minimal vascular prominence without congestive failure or focal infiltrate. ReadingStation:[email or web address]    Xr Hip Right 2-3 Vw Without Pelvis    Result Date: 05/26/2018  Acute fracture involving the mid femur adjacent to the distal tip of the femoral prosthesis. ReadingStation:[email or web address]    Xr Knee 1 Or 2 Views Right    Result Date: 06/02/2018  1. Small suprapatellar joint effusion and diffuse soft tissue swelling of the thigh without acute bony pathology. ReadingStation:[email or web address]    Xr Knee Right 4+ Views    Result Date: 05/26/2018  1.  No acute fracture is identified.  ReadingStation:[id number]    Ct Hip Right Wo Contrast    Result Date: 05/27/2018  Postoperative change of right total hip replacement with right femur fracture involving the greater trochanteric region extending into the proximal and mid diaphyseal region of the right femur with lateral displacement of the fracture fragment distal to the shaft of the femoral prosthesis. Acetabular component appears in place with no acetabular fracture identified. ReadingStation:[email or web address]    US Venous Leg Right    Result Date: 05/26/2018  No acute deep vein thrombosis is identified. ReadingStation:[id number]    Xr Femur Right 1 View (see Comments)    Result Date: 05/29/2018  1. Appropriate positioning of the new right femoral prosthesis. 2. Right femoral diaphyseal fracture immobilized with a longer stem and reinforced with 2 wire loops. ReadingStation:[email or web address]    Problem list:  Patient Active Problem List   Diagnosis   . Osteoarthritis of right hip   . Femur fracture, right   . CAD (coronary artery disease)   . AF (atrial fibrillation)   . DM (diabetes mellitus)   . HTN (hypertension)      Past Medical/Surgical History:  Past Medical History:   Diagnosis Date   . Atrial flutter    . CAD (coronary artery disease)    . Carpal tunnel syndrome     H/O   . DM2 (diabetes mellitus, type 2)    . ED (erectile dysfunction)    . Encounter  for blood transfusion    . Hypertension    . MI (myocardial infarction) '08, '11   . OA (osteoarthritis)    . Psoriasis       Past Surgical History:   Procedure Laterality Date   . ARTHROPLASTY, HIP, TOTAL, ANTERIOR APPROACH, C ARM Right 05/19/2018    Procedure: ARTHROPLASTY, HIP, TOTAL, ANTERIOR APPROACH, C ARM;  Surgeon: Len Childs, MD;  Location: [facility];  Service: Orthopedics;  Laterality: Right;  RIGHT TOTAL HIP REPLACEMENT, ASI    . ARTHROPLASTY, HIP, TOTAL, REVISION, ANTERIOR APPROACH, C ARM Right 05/29/2018    Procedure: ARTHROPLASTY, HIP, TOTAL, REVISION, ANTERIOR APPROACH, C ARM;  Surgeon: Len Childs, MD;  Location: [facility];  Service: Orthopedics;  Laterality: Right;  RT REVISION PROSTETIC TOTAL HIP    . BICEPS TENDON REPAIR Right    . CARDIOVERSION     . CORONARY ARTERY BYPASS GRAFT  2011   . CYST REMOVAL      RIGHT CHEEK   . INGUINAL HERNIA REPAIR Right     AS CHILD   . KNEE ARTHROTOMY Left 1975   . ORIF, RADIUS, DISTAL (WRIST) Right    . SINUS SURGERY         Subjective   "I'M GETTING MY STRENGTH  BACK."  Patient/family/caregiver consent to therapy session is noted by the participation in the therapy session.    Patient/caregiver goal for PT: return to previous levels of activity and independence.     Pain:  At Rest: 0/10  With Activity: 6/10  Location: Hip:  right  Interventions: Medication (see eMAR), Repositioned, RN/LPN aware    Examination of Body Systems (Structures, Function, Activity and Participation)   Patient's medical condition is appropriate for Physical therapy intervention at this time    Observation of patient:  Patient is in bed with Bed/chair alarm on    Cognition:  Oriented to: Oriented x4  Command following: Follows ALL commands and directions without difficulty  Alertness/Arousal: Appropriate responses to stimuli   Attention Span:Appears intact  Memory: Appears intact    Vital Signs (Cardiovascular):  Vitals stable throughout             Balance:  Static Sitting:  Good  Static Standing:  Good WITH WHEELED WALKER USE  Dynamic Standing:  Fair+ WITH WHEELED WALKER USE.          Functional Mobility:    Bed Mobility:  Supine to Sit:   Modified independent    Bed rail used, USED LLE TO ASSIST RLE MOVEMENT OOB. GOOD SITTING BALANCE.  Sit to Supine:   MIN A FOR LE MOVEMENT INTO BED.  Bed rail used. PATIENT USED LLE TO ASSIST RLE.    Transfers:  Sit to Stand:  MIN A with Front wheeled walker.    Cues for Hand Placement, Cues for Walker Management. MAINTAINED NWB RLE FROM BED AND W/C. STOOD FROM BED AND W/C. MORE DIFFICULTY STANDING FROM LOWER SEAT HEIGHT.  Stand to Sit:  MIN A.  GOOD UE ASSIST.    Cues for Sequencing, Cues for Hand Placement, Cues for Walker Management    Locomotion:  GAIT: PATIENT AMB 15' WITH WHEELED WALKER AND MIN A. EXHIBITED 3 POINT GAIT. NO LOB. MAINTAINED NWB CONSISTENTLY. GOOD COMPENSATION WITH BUE ASSIST/SUPPORT.        Treatment Interventions this session:   Therapeutic activity  GAIT TRAINING  Patient/family/caregiver education  THEREX: AP, QS, HS, LAQ, IR/ER HIP. ER PERSISTING WITH HS AND PARTIALLY CORRECTED WHEN CUED.       Education Provided:   TOPICS: role of physical therapy, plan of care, goals of therapy and HEP, safety with mobility and ADLs, benefits of activity, weight bearing precautions, activity with nursing     Learner educated: Patient  Method: Explanation and Demonstration  Response to education: Verbalized understanding    Patient Position at End of Treatment:   Supine, in bed, in the room, Needs in reach, No distress and Ice applied    Team Communication:     Spoke to: RN  Regarding: Pre-session re: patient status, Patient position at end of session, Discharge needs, Patient participation with Therapy  Whiteboard updated: yes.  exercises  PT/PTA communication: via written note and verbal communication as needed.    Time of treatment:  Time Calculation  PT Received On: 06/02/18  Start Time: 1324  Stop Time:  1352  Time Calculation (min): 28 min    Maryann Alar, PT  ______________________________________________________________________

## 2018-06-03 ENCOUNTER — Inpatient Hospital Stay
Admission: RE | Admit: 2018-06-03 | Discharge: 2018-06-20 | DRG: 560 | Disposition: A | Discharge status: Home Health | Payer: BC Managed Care – PPO | Source: Intra-hospital | Attending: Internal Medicine | Admitting: Internal Medicine

## 2018-06-03 DIAGNOSIS — W19XXXA Unspecified fall, initial encounter: Secondary | ICD-10-CM | POA: Diagnosis present

## 2018-06-03 DIAGNOSIS — M1990 Unspecified osteoarthritis, unspecified site: Secondary | ICD-10-CM | POA: Diagnosis present

## 2018-06-03 DIAGNOSIS — M9701XA Periprosthetic fracture around internal prosthetic right hip joint, initial encounter: Secondary | ICD-10-CM | POA: Diagnosis present

## 2018-06-03 DIAGNOSIS — R5381 Other malaise: Secondary | ICD-10-CM | POA: Diagnosis present

## 2018-06-03 DIAGNOSIS — R609 Edema, unspecified: Secondary | ICD-10-CM | POA: Diagnosis not present

## 2018-06-03 DIAGNOSIS — E8770 Fluid overload, unspecified: Secondary | ICD-10-CM | POA: Diagnosis not present

## 2018-06-03 DIAGNOSIS — N529 Male erectile dysfunction, unspecified: Secondary | ICD-10-CM | POA: Diagnosis present

## 2018-06-03 DIAGNOSIS — Z7901 Long term (current) use of anticoagulants: Secondary | ICD-10-CM

## 2018-06-03 DIAGNOSIS — Z794 Long term (current) use of insulin: Secondary | ICD-10-CM

## 2018-06-03 DIAGNOSIS — D500 Iron deficiency anemia secondary to blood loss (chronic): Secondary | ICD-10-CM | POA: Diagnosis present

## 2018-06-03 DIAGNOSIS — I4892 Unspecified atrial flutter: Secondary | ICD-10-CM | POA: Diagnosis present

## 2018-06-03 DIAGNOSIS — Z951 Presence of aortocoronary bypass graft: Secondary | ICD-10-CM

## 2018-06-03 DIAGNOSIS — I252 Old myocardial infarction: Secondary | ICD-10-CM

## 2018-06-03 DIAGNOSIS — Z79899 Other long term (current) drug therapy: Secondary | ICD-10-CM

## 2018-06-03 DIAGNOSIS — E785 Hyperlipidemia, unspecified: Secondary | ICD-10-CM | POA: Diagnosis present

## 2018-06-03 DIAGNOSIS — E11649 Type 2 diabetes mellitus with hypoglycemia without coma: Secondary | ICD-10-CM | POA: Diagnosis present

## 2018-06-03 DIAGNOSIS — I2510 Atherosclerotic heart disease of native coronary artery without angina pectoris: Secondary | ICD-10-CM | POA: Diagnosis present

## 2018-06-03 DIAGNOSIS — E222 Syndrome of inappropriate secretion of antidiuretic hormone: Secondary | ICD-10-CM | POA: Diagnosis present

## 2018-06-03 DIAGNOSIS — I10 Essential (primary) hypertension: Secondary | ICD-10-CM | POA: Diagnosis present

## 2018-06-03 DIAGNOSIS — R2689 Other abnormalities of gait and mobility: Secondary | ICD-10-CM | POA: Diagnosis present

## 2018-06-03 DIAGNOSIS — Z4789 Encounter for other orthopedic aftercare: Principal | ICD-10-CM

## 2018-06-03 DIAGNOSIS — Z9861 Coronary angioplasty status: Secondary | ICD-10-CM

## 2018-06-03 DIAGNOSIS — L03115 Cellulitis of right lower limb: Secondary | ICD-10-CM | POA: Diagnosis present

## 2018-06-03 LAB — CBC AND DIFFERENTIAL
Bands: 7 % (ref 0–10)
Basophils %: 0 % (ref 0.0–3.0)
Basophils Absolute: 0 10*3/uL (ref 0.0–0.3)
Eosinophils %: 0 % (ref 0.0–7.0)
Eosinophils Absolute: 0 10*3/uL (ref 0.0–0.8)
Hematocrit: 26.5 % — ABNORMAL LOW (ref 39.0–52.5)
Hemoglobin: 8.9 gm/dL — ABNORMAL LOW (ref 13.0–17.5)
Lymphocytes Absolute: 1.4 10*3/uL (ref 0.6–5.1)
Lymphocytes: 14 % — ABNORMAL LOW (ref 15.0–46.0)
MCH: 34 pg (ref 28–35)
MCHC: 33 gm/dL (ref 32–36)
MCV: 102 fL — ABNORMAL HIGH (ref 80–100)
MPV: 7.4 fL (ref 6.0–10.0)
Metamyelocytes: 1 % — ABNORMAL HIGH (ref 0–0)
Monocytes Absolute: 0.3 10*3/uL (ref 0.1–1.7)
Monocytes: 3 % (ref 3.0–15.0)
Myelocytes: 1 % — ABNORMAL HIGH (ref 0–0)
Neutrophils %: 74 % (ref 42.0–78.0)
Neutrophils Absolute: 8.1 10*3/uL (ref 1.7–8.6)
PLT CT: 419 10*3/uL (ref 130–440)
RBC: 2.61 10*6/uL — ABNORMAL LOW (ref 4.00–5.70)
RDW: 17.4 % — ABNORMAL HIGH (ref 11.0–14.0)
WBC: 9.8 10*3/uL (ref 4.0–11.0)

## 2018-06-03 LAB — VH DEXTROSE STICK GLUCOSE
Glucose POCT: 169 mg/dL — ABNORMAL HIGH (ref 71–99)
Glucose POCT: 207 mg/dL — ABNORMAL HIGH (ref 71–99)
Glucose POCT: 157 mg/dL — ABNORMAL HIGH (ref 71–99)
Glucose POCT: 224 mg/dL — ABNORMAL HIGH (ref 71–99)
Glucose POCT: 166 mg/dL — ABNORMAL HIGH (ref 71–99)

## 2018-06-03 MED ORDER — ACETAMINOPHEN 325 MG PO TABS
650.00 mg | ORAL_TABLET | ORAL | Status: DC
Start: 2018-06-03 — End: 2018-06-12
  Administered 2018-06-03 – 2018-06-12 (×50): 650 mg via ORAL
  Filled 2018-06-03 (×44): qty 2

## 2018-06-03 MED ORDER — LOSARTAN POTASSIUM 50 MG PO TABS
50.00 mg | ORAL_TABLET | Freq: Every day | ORAL | Status: DC
Start: 2018-06-04 — End: 2018-06-20
  Administered 2018-06-04 – 2018-06-20 (×17): 50 mg via ORAL
  Filled 2018-06-03 (×17): qty 1

## 2018-06-03 MED ORDER — METFORMIN HCL 500 MG PO TABS
500.00 mg | ORAL_TABLET | Freq: Two times a day (BID) | ORAL | Status: DC
Start: 2018-06-04 — End: 2018-06-08
  Administered 2018-06-04 – 2018-06-08 (×9): 500 mg via ORAL
  Filled 2018-06-03 (×9): qty 1

## 2018-06-03 MED ORDER — SITAGLIPTIN PHOSPHATE 50 MG PO TABS
50.00 mg | ORAL_TABLET | Freq: Two times a day (BID) | ORAL | Status: DC
Start: 2018-06-03 — End: 2018-06-20
  Administered 2018-06-04 – 2018-06-20 (×34): 50 mg via ORAL
  Filled 2018-06-03 (×35): qty 1

## 2018-06-03 MED ORDER — NALOXONE HCL 0.4 MG/ML IJ SOLN (WRAP)
0.40 mg | INTRAMUSCULAR | Status: DC | PRN
Start: 2018-06-03 — End: 2018-06-20

## 2018-06-03 MED ORDER — INSULIN LISPRO 100 UNIT/ML SC SOPN
1.00 [IU] | PEN_INJECTOR | Freq: Every evening | SUBCUTANEOUS | Status: DC
Start: 2018-06-03 — End: 2018-06-20
  Administered 2018-06-03 – 2018-06-05 (×3): 1 [IU] via SUBCUTANEOUS
  Administered 2018-06-06: 21:00:00 3 [IU] via SUBCUTANEOUS
  Administered 2018-06-07 – 2018-06-16 (×6): 1 [IU] via SUBCUTANEOUS

## 2018-06-03 MED ORDER — ACETAMINOPHEN 325 MG PO TABS
650.00 mg | ORAL_TABLET | ORAL | Status: DC | PRN
Start: 2018-06-03 — End: 2018-06-20
  Administered 2018-06-16 (×2): 650 mg via ORAL
  Filled 2018-06-03: qty 2

## 2018-06-03 MED ORDER — DIPHENHYDRAMINE HCL 25 MG PO CAPS
25.00 mg | ORAL_CAPSULE | Freq: Two times a day (BID) | ORAL | Status: DC | PRN
Start: 2018-06-03 — End: 2018-06-20

## 2018-06-03 MED ORDER — TIZANIDINE HCL 4 MG PO TABS
4.00 mg | ORAL_TABLET | Freq: Two times a day (BID) | ORAL | Status: DC
Start: 2018-06-03 — End: 2018-06-08
  Administered 2018-06-04 – 2018-06-07 (×7): 4 mg via ORAL
  Filled 2018-06-03 (×8): qty 1

## 2018-06-03 MED ORDER — VH PROMETHAZINE HCL 25 MG/ML IM SOLN
6.25 mg | Freq: Four times a day (QID) | INTRAMUSCULAR | Status: DC | PRN
Start: 2018-06-03 — End: 2018-06-20

## 2018-06-03 MED ORDER — OXYCODONE HCL 5 MG PO TABS
10.00 mg | ORAL_TABLET | ORAL | Status: DC | PRN
Start: 2018-06-03 — End: 2018-06-10
  Administered 2018-06-03 – 2018-06-10 (×33): 10 mg via ORAL
  Filled 2018-06-03 (×33): qty 2

## 2018-06-03 MED ORDER — SODIUM CHLORIDE 0.9 % IJ SOLN
3.00 mL | Freq: Three times a day (TID) | INTRAMUSCULAR | Status: DC
Start: 2018-06-03 — End: 2018-06-04

## 2018-06-03 MED ORDER — GABAPENTIN 100 MG PO CAPS
100.00 mg | ORAL_CAPSULE | Freq: Three times a day (TID) | ORAL | Status: DC
Start: 2018-06-03 — End: 2018-06-03
  Administered 2018-06-03: 13:00:00 100 mg via ORAL
  Filled 2018-06-03: qty 1

## 2018-06-03 MED ORDER — PROMETHAZINE HCL 25 MG PO TABS
25.00 mg | ORAL_TABLET | Freq: Four times a day (QID) | ORAL | Status: DC | PRN
Start: 2018-06-03 — End: 2018-06-20

## 2018-06-03 MED ORDER — GLIPIZIDE 5 MG PO TABS
10.00 mg | ORAL_TABLET | Freq: Two times a day (BID) | ORAL | Status: DC
Start: 2018-06-04 — End: 2018-06-20
  Administered 2018-06-04 – 2018-06-20 (×34): 10 mg via ORAL
  Filled 2018-06-03 (×34): qty 2

## 2018-06-03 MED ORDER — ONDANSETRON 4 MG PO TBDP
4.00 mg | ORAL_TABLET | Freq: Three times a day (TID) | ORAL | Status: DC | PRN
Start: 2018-06-03 — End: 2018-06-20

## 2018-06-03 MED ORDER — POLYETHYLENE GLYCOL 3350 17 G PO PACK
17.00 g | PACK | Freq: Two times a day (BID) | ORAL | Status: DC
Start: 2018-06-03 — End: 2018-06-14
  Administered 2018-06-04 – 2018-06-13 (×19): 17 g via ORAL
  Filled 2018-06-03 (×21): qty 1

## 2018-06-03 MED ORDER — CARVEDILOL 6.25 MG PO TABS
6.25 mg | ORAL_TABLET | Freq: Two times a day (BID) | ORAL | Status: DC
Start: 2018-06-04 — End: 2018-06-05
  Administered 2018-06-04 – 2018-06-05 (×3): 6.25 mg via ORAL
  Filled 2018-06-03 (×3): qty 1

## 2018-06-03 MED ORDER — TRAMADOL HCL 50 MG PO TABS
50.00 mg | ORAL_TABLET | Freq: Four times a day (QID) | ORAL | Status: DC
Start: 2018-06-04 — End: 2018-06-06
  Administered 2018-06-03 – 2018-06-06 (×10): 50 mg via ORAL
  Filled 2018-06-03 (×10): qty 1

## 2018-06-03 MED ORDER — OXYCODONE HCL 5 MG PO TABS
5.00 mg | ORAL_TABLET | ORAL | Status: DC | PRN
Start: 2018-06-03 — End: 2018-06-10
  Filled 2018-06-03 (×2): qty 1

## 2018-06-03 MED ORDER — BISACODYL 5 MG PO TBEC
5.00 mg | DELAYED_RELEASE_TABLET | Freq: Every day | ORAL | Status: DC
Start: 2018-06-04 — End: 2018-06-17
  Administered 2018-06-04 – 2018-06-14 (×9): 5 mg via ORAL
  Filled 2018-06-03 (×13): qty 1

## 2018-06-03 MED ORDER — DIPHENHYDRAMINE HCL 25 MG PO CAPS
25.00 mg | ORAL_CAPSULE | Freq: Every evening | ORAL | Status: DC | PRN
Start: 2018-06-03 — End: 2018-06-20

## 2018-06-03 MED ORDER — PROMETHAZINE HCL 12.5 MG RE SUPP
12.50 mg | Freq: Four times a day (QID) | RECTAL | Status: DC | PRN
Start: 2018-06-03 — End: 2018-06-20
  Filled 2018-06-03: qty 1

## 2018-06-03 MED ORDER — APIXABAN 5 MG PO TABS
5.00 mg | ORAL_TABLET | Freq: Two times a day (BID) | ORAL | Status: DC
Start: 2018-06-03 — End: 2018-06-20
  Administered 2018-06-03 – 2018-06-20 (×34): 5 mg via ORAL
  Filled 2018-06-03 (×34): qty 1

## 2018-06-03 MED ORDER — INSULIN LISPRO 100 UNIT/ML SC SOPN
1.00 [IU] | PEN_INJECTOR | Freq: Every day | SUBCUTANEOUS | Status: DC | PRN
Start: 2018-06-03 — End: 2018-06-20
  Administered 2018-06-04 – 2018-06-08 (×7): 1 [IU] via SUBCUTANEOUS
  Filled 2018-06-03: qty 3

## 2018-06-03 MED ORDER — ONDANSETRON HCL 4 MG/2ML IJ SOLN
4.00 mg | Freq: Three times a day (TID) | INTRAMUSCULAR | Status: DC | PRN
Start: 2018-06-03 — End: 2018-06-20

## 2018-06-03 MED ORDER — GABAPENTIN 100 MG PO CAPS
100.00 mg | ORAL_CAPSULE | Freq: Three times a day (TID) | ORAL | Status: DC
Start: 2018-06-03 — End: 2018-06-06
  Administered 2018-06-03 – 2018-06-06 (×8): 100 mg via ORAL
  Filled 2018-06-03 (×8): qty 1

## 2018-06-03 MED ORDER — AMLODIPINE BESYLATE 5 MG PO TABS
10.00 mg | ORAL_TABLET | Freq: Every day | ORAL | Status: DC
Start: 2018-06-04 — End: 2018-06-20
  Administered 2018-06-04 – 2018-06-20 (×17): 10 mg via ORAL
  Filled 2018-06-03 (×18): qty 2

## 2018-06-03 MED ORDER — INSULIN LISPRO 100 UNIT/ML SC SOPN
2.00 [IU] | PEN_INJECTOR | Freq: Three times a day (TID) | SUBCUTANEOUS | Status: DC
Start: 2018-06-04 — End: 2018-06-20
  Administered 2018-06-04: 12:00:00 3 [IU] via SUBCUTANEOUS
  Administered 2018-06-04 – 2018-06-07 (×7): 2 [IU] via SUBCUTANEOUS
  Administered 2018-06-08: 08:00:00 3 [IU] via SUBCUTANEOUS
  Administered 2018-06-09 – 2018-06-14 (×2): 2 [IU] via SUBCUTANEOUS

## 2018-06-03 MED ORDER — ONDANSETRON HCL 4 MG/2ML IJ SOLN
4.00 mg | Freq: Four times a day (QID) | INTRAMUSCULAR | Status: DC | PRN
Start: 2018-06-03 — End: 2018-06-20

## 2018-06-03 NOTE — Progress Notes (Signed)
[facility] PROGRESS NOTE    Date Time: 06/03/18 7:38 AM  Patient Name: [name],[name]      Assessment:   status post open reduction internal fixation right periprosthetic femur fracture with longstem femur implant    Plan:    Weight bearing status: Nonweightbearing right lower extremity   Precautions: Anterior hip, fall   PT/OT: Out of bed   Anticoagulation: Apixaban    Follow up: CBC stable   Knee injected yesterday   Rehab when medically stable   Follow up with me July 16      Subjective:   Pain controlled    Medications:     Current Facility-Administered Medications   Medication Dose Route Frequency   . acetaminophen  650 mg Oral Q4H   . amLODIPine  10 mg Oral Daily   . apixaban  5 mg Oral Q12H   . bisacodyl  5 mg Oral Daily   . carvedilol  6.25 mg Oral BID Meals   . glipiZIDE  10 mg Oral BID AC   . insulin lispro  2-16 Units Subcutaneous TID AC    And   . insulin lispro  1-9 Units Subcutaneous QHS   . lactobacillus species  50 Billion CFU Oral Daily   . losartan  50 mg Oral Daily   . metFORMIN  500 mg Oral BID Meals   . polyethylene glycol  17 g Oral BID   . SITagliptin  50 mg Oral BID   . sodium chloride (PF)  3 mL Intravenous Q8H   . tiZANidine  4 mg Oral BID   . traMADol  50 mg Oral 4 times per day         Physical Exam:     Vitals:    06/03/18 0713   BP: 149/82   Pulse: 87   Resp:    Temp: 98.4 F (36.9 C)   SpO2: 97%       Intake and Output Summary (Last 24 hours) at Date Time    Intake/Output Summary (Last 24 hours) at 06/03/18 0738  Last data filed at 06/03/18 5409   Gross per 24 hour   Intake              980 ml   Output             1800 ml   Net             -820 ml         Neurological -  motor and sensory grossly normal  Musculoskeletal - SILT s/s/ta/dp/sp, toes wawp, +df/pf toes and ankle    Incision- dressing with ss drainage    Labs:     Results     Procedure Component Value Units Date/Time    Dextrose Stick Glucose [[id number]]  (Abnormal) Collected:  06/03/18  0715    Specimen:  Blood Updated:  06/03/18 0733     Glucose, POCT 207 (H) mg/dL     CBC with Automated Differential [[id number]]  (Abnormal) Collected:  06/03/18 0538    Specimen:  Blood from Blood Updated:  06/03/18 0643     WBC 9.8 K/cmm      RBC 2.61 (L) M/cmm      Hemoglobin 8.9 (L) gm/dL      Hematocrit 57.8 (L) %      MCV 102 (H) fL      MCH 34 pg      MCHC 33 gm/dL      RDW 46.9 (H) %  PLT CT 419 K/cmm      MPV 7.4 fL     Narrative:       Manual differential performed    Dextrose Stick Glucose [[id number]]  (Abnormal) Collected:  06/03/18 0327    Specimen:  Blood Updated:  06/03/18 0344     Glucose, POCT 169 (H) mg/dL     Dextrose Stick Glucose [[id number]]  (Abnormal) Collected:  06/02/18 2102    Specimen:  Blood Updated:  06/02/18 2118     Glucose, POCT 151 (H) mg/dL     Dextrose Stick Glucose [[id number]]  (Abnormal) Collected:  06/02/18 1623    Specimen:  Blood Updated:  06/02/18 1639     Glucose, POCT 171 (H) mg/dL     CBC and differential [[id number]]  (Abnormal) Collected:  06/02/18 1032    Specimen:  Blood from Blood Updated:  06/02/18 1413     WBC 11.9 (H) K/cmm      RBC 2.82 (L) M/cmm      Hemoglobin 9.8 (L) gm/dL      Hematocrit 62.9 (L) %      MCV 102 (H) fL      MCH 35 pg      MCHC 34 gm/dL      RDW 52.8 (H) %      PLT CT 469 (H) K/cmm      MPV 7.3 fL      NEUTROPHIL % 77.0 %      Lymphocytes 14.0 (L) %      Monocytes 7.0 %      Eosinophils % 2.0 %      Basophils % 0.0 %      Neutrophils Absolute 9.2 (H) K/cmm      Lymphocytes Absolute 1.7 K/cmm      Monocytes Absolute 0.8 K/cmm      Eosinophils Absolute 0.2 K/cmm      BASO Absolute 0.0 K/cmm      RBC Morphology RBC Morphology Reviewed     Macrocytic 1+     Anisocytosis 1+     Polychromasia 1+    Narrative:       Manual differential performed    Dextrose Stick Glucose [[id number]]  (Abnormal) Collected:  06/02/18 1225    Specimen:  Blood Updated:  06/02/18 1243     Glucose, POCT 153 (H) mg/dL     Basic Metabolic Panel [[id number]]  (Abnormal)  Collected:  06/02/18 1032    Specimen:  Plasma Updated:  06/02/18 1126     Sodium 135 (L) mMol/L      Potassium 4.7 mMol/L      Chloride 102 mMol/L      CO2 24.1 mMol/L      Calcium 9.2 mg/dL      Glucose 034 (H) mg/dL      Creatinine 7.42 mg/dL      BUN 17 mg/dL      Anion Gap 59.5 mMol/L      BUN/Creatinine Ratio 20.7 Ratio      EGFR 95 mL/min/1.42m2      Osmolality Calc 276 mOsm/kg     Dextrose Stick Glucose [[id number]]  (Abnormal) Collected:  06/02/18 0722    Specimen:  Blood Updated:  06/02/18 0741     Glucose, POCT 187 (H) mg/dL     B-type Natriuretic Peptide [[id number]]  (Abnormal) Collected:  06/02/18 0539    Specimen:  Blood Updated:  06/02/18 0740     B-Natriuretic Peptide 231.0 (H) pg/mL  Rads:   Xr Knee 1 Or 2 Views Right    Result Date: 06/02/2018  1. Small suprapatellar joint effusion and diffuse soft tissue swelling of the thigh without acute bony pathology. ReadingStation:[email or web address]      Signed by: Len Childs, MD

## 2018-06-03 NOTE — Progress Notes (Signed)
NURSE NOTE SUMMARY  [facility] - ORTHOPAEDICS   Patient Name: [name]   Attending Physician: Alease Medina, MD   Today's date:   06/03/2018 LOS: 8 days   Shift Summary:                                                              Assumed care of pt, assessment complete, pt denies any needs at this time.    Dressings intact, IV intact.    Reviewed plan of care for the day:   Ambulate with PT   Pain control, pt will use white board to keep track of medication times.   Temp: 98.4 F (36.9 C), Heart Rate: 87, Resp Rate: 16, BP: 149/82     Will continue to monitor on rounds.    1555 report called to Faith RN at [facility].   Provider Notifications:      Rapid Response Notifications:  Mobility:      PMP Activity: Step 6 - Walks in Room (06/02/2018  8:00 PM)   Weight tracking:  Family Dynamic:   No data found.       Healthcare Agent's Name: [facility]  Healthcare Agent's Phone Number: [phone]   Recent Vitals Last Bowel Movement   BP 149/82   Pulse 87   Temp 98.4 F (36.9 C) (Oral)   Resp 16   Ht 1.93 m (6\' 4" )   Wt 135.5 kg (298 lb 11.6 oz)   SpO2 97%   BMI 36.36 kg/m  Last BM Date: 06/02/18

## 2018-06-03 NOTE — Plan of Care (Signed)
Problem: Pain interferes with ability to perform ADL  Goal: Pain at adequate level as identified by patient  Outcome: Progressing   06/02/18 2000   Goal/Interventions addressed this shift   Pain at adequate level as identified by patient Identify patient comfort function goal;Assess for risk of opioid induced respiratory depression, including snoring/sleep apnea. Alert healthcare team of risk factors identified.;Assess pain on admission, during daily assessment and/or before any "as needed" intervention(s);Reassess pain within 30-60 minutes of any procedure/intervention, per Pain Assessment, Intervention, Reassessment (AIR) Cycle;Evaluate if patient comfort function goal is met;Evaluate patient's satisfaction with pain management progress;Offer non-pharmacological pain management interventions;Include patient/patient care companion in decisions related to pain management as needed       Problem: Hip Surgery  Goal: Free from Infection  Outcome: Progressing   06/02/18 1533   Goal/Interventions addressed this shift   Free from infection Monitor/assess vital signs;Maintain temperature within desired parameters;Assess for signs and symptoms of infection;Assess surgical dressing, reinforce or change as needed per order;Teach/reinforce use of incentive spirometer 10 times per hour while awake, cough and deep breath as needed;Administer and discontinue antibiotics per SCIP measures     Goal: Neurovascular Status is Stable  Outcome: Progressing   06/02/18 1533   Goal/Interventions addressed this shift   Neurovascular status is stable  Assess and document plantar/dorsiflexion;VTE prevention: administer anticoagulant(s) and/or apply anti-embolism stockings/devices as ordered;Monitor/assess neurovascular status (pulses, capillary refill, pain, paresthesia, presence of edema)

## 2018-06-03 NOTE — Discharge Summary (Signed)
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 male  with h/o AF/flutter on eliquis, CAD s/p CABG, DM, HTN, who had scheduled right hip arthroplasty on 6-17 for chronic arthritis and discharged home and ha a fall causing injury to right knee with significant right leg pain.   XR showed periprosthetic femur fracture.    The patient was seen by Dr Emeline Darling and after no evidence of acetabular abnormality on CT scan was conformed, he underwent ORIF by Dr Emeline Darling.  Post op course complicated by post hemorrhagic anemia requiring 2 units pRBC transfusion. He also developed mild acute hyponatremia likely SIADH from pain/stress from surgery which improved   with fluid restriction for a day.  All his home meds were resumed post surgery and he began tolerating PT well. He will be discharged to rehab today.      Pending Results and other significant studies:  none   Discharge Instructions:        Disposition:  SNF  Diet: Cardiac Consistent Carbohydrates  Activity: As tolerated  Discharge Code Status: Full Code  Lucretia Kern Richrd Prime, MD  [address]  [place] [address]  [phone]    Follow up  July 16 at 2:00 pm     Md, Out Of Network           Discharge Medications:                                                                     Current Facility-Administered Medications   Medication Dose Route Frequency Last Rate Last Dose   . acetaminophen (TYLENOL) tablet  650 mg  650 mg Oral Q4H PRN       . acetaminophen (TYLENOL) tablet 650 mg  650 mg Oral Q4H   650 mg at 06/03/18 0754   . amLODIPine (NORVASC) tablet 10 mg  10 mg Oral Daily   10 mg at 06/03/18 0755   . apixaban (ELIQUIS) tablet 5 mg  5 mg Oral Q12H   5 mg at 06/03/18 0755   . bisacodyl (DULCOLAX) EC tablet 5 mg  5 mg Oral Daily   5 mg at 06/01/18 1043   . carvedilol (COREG) tablet 6.25 mg  6.25 mg Oral BID Meals   6.25 mg at 06/03/18 0755   . dextrose (D10W) 10% bolus 125 mL  125 mL Intravenous PRN       . diphenhydrAMINE (BENADRYL) capsule 25 mg  25 mg Oral Q12H PRN       . diphenhydrAMINE (BENADRYL) capsule 25 mg  25 mg Oral QHS PRN       . gabapentin (  NEURONTIN) capsule 100 mg  100 mg Oral Q8H SCH   100 mg at 06/03/18 1317   . glipiZIDE (GLUCOTROL) tablet 10 mg  10 mg Oral BID AC   10 mg at 06/03/18 0754   . glucagon (rDNA) (GLUCAGEN) injection 1 mg  1 mg Intramuscular PRN       . hydrALAZINE (APRESOLINE) injection 10 mg  10 mg Intravenous Q6H PRN       . insulin lispro (HumaLOG) injection pen 2-16 Units  2-16 Units Subcutaneous TID AC   2 Units at 06/03/18 1250    And   . insulin lispro (HumaLOG) injection pen 1-9 Units  1-9 Units Subcutaneous QHS   1 Units at 06/02/18 2125    And   . insulin lispro (HumaLOG) injection pen 1-9 Units  1-9 Units Subcutaneous Daily PRN   1 Units at 06/03/18 0329   . lactobacillus species (BIO-K PLUS) capsule 50 Billion CFU  50 Billion CFU Oral Daily   50 Billion CFU at 06/03/18 1018   . losartan (COZAAR) tablet 50 mg  50 mg Oral Daily   50 mg at 06/03/18 0756   . metFORMIN (GLUCOPHAGE) tablet 500 mg  500 mg Oral BID Meals   500 mg at 06/03/18 0754   . morphine injection 2 mg  2 mg Intravenous Q2H PRN   2 mg at 06/02/18 0848   . naloxone [facility]) injection 0.4 mg  0.4 mg Intravenous PRN       . ondansetron (ZOFRAN) injection 4 mg  4 mg Intravenous Q6H PRN   4 mg at 05/29/18 0350   . ondansetron (ZOFRAN-ODT) disintegrating tablet 4 mg  4 mg Oral Q8H PRN        Or   . ondansetron  (ZOFRAN) injection 4 mg  4 mg Intravenous Q8H PRN       . oxyCODONE (ROXICODONE) immediate release tablet 10 mg  10 mg Oral Q3H PRN   10 mg at 06/03/18 1516   . oxyCODONE (ROXICODONE) immediate release tablet 5 mg  5 mg Oral Q3H PRN       . polyethylene glycol (MIRALAX) packet 17 g  17 g Oral BID   17 g at 06/03/18 1018   . promethazine (PHENERGAN) tablet 25 mg  25 mg Oral Q6H PRN        Or   . promethazine (PHENERGAN) suppository 12.5 mg  12.5 mg Rectal Q6H PRN        Or   . promethazine (PHENERGAN) IM injection 6.25 mg  6.25 mg Intramuscular Q6H PRN       . SITagliptin (JANUVIA) tablet 50 mg  50 mg Oral BID   50 mg at 06/03/18 1018   . sodium chloride (PF) 0.9 % injection 3 mL  3 mL Intravenous Q8H   3 mL at 06/03/18 0617   . tiZANidine (ZANAFLEX) tablet 4 mg  4 mg Oral BID   4 mg at 06/03/18 1018   . traMADol (ULTRAM) tablet 50 mg  50 mg Oral 4 times per day   50 mg at 06/03/18 1251      Discharge Day Exam (06/03/2018):   Blood pressure 149/82, pulse 87, temperature 98.4 F (36.9 C), temperature source Oral, resp. rate 16, height 1.93 m (6\' 4" ), weight 135.5 kg (298 lb 11.6 oz), SpO2 97 %.    General: Patient is awake. In no acute distress.  HEENT: PERRL, EOMI, no conjunctival drainage, vision is intact.  Neck: supple, no thyromegaly.  Chest: CTA  bilaterally. No rhonchi, no wheezing. No use of accessory muscles.  CVS: normal rate and regular rhythm no murmurs, without JVD.  Abdomen: soft, non-tender, no guarding or rigidity, with normal bowel sounds.  Extremities: RLE pitting edema, pulses palpable, no calf swelling and gross no deformity.  Skin: Warm, dry, no rash and no worrisome lesions.  NEURO: no motor or sensory deficits.  Psychiatric: alert, interactive, appropriate, normal affect.   Recent Labs      Recent Labs  Lab 06/03/18  0538 06/02/18  1032 06/01/18  0656 05/31/18  0557 05/30/18  0448   WBC 9.8 11.9* 9.6 10.1 10.3   RBC 2.61* 2.82* 2.57* 2.48* 2.14*   Hemoglobin 8.9* 9.8* 8.8* 8.3* 7.4*   Hematocrit  26.5* 28.7* 26.0* 25.0* 22.0*   MCV 102* 102* 101* 101* 103*   PLT CT 419 469* 351 301 307       Recent Labs  Lab 05/28/18  0956   PT 11.1   PT INR 1.1         Lab Results   Component Value Date    HGBA1CPERCNT 5.5 05/19/2018       Recent Labs  Lab 06/02/18  1032 06/01/18  0656 05/31/18  0557 05/30/18  0448 05/28/18  0956   Glucose 178* 168* 125* 126* 199*   Sodium 135* 133* 130* 132* 133*   Potassium 4.7 4.7 4.3 4.9 4.7   Chloride 102 101 101 101 99   CO2 24.1 24 22.4 26.5 26.7   BUN 17 18 15 22  25*   Creatinine 0.82 0.68* 0.71* 0.88 0.89   EGFR 95 103 101 93 92   Calcium 9.2 8.8 8.3* 7.9* 8.8          Allergies:      Patient has no known allergies.   Time spent on discharging the patient:  40 minutes   Xr Chest 2 Views    Result Date: 06/01/2018  IMPRESSION: Minimal vascular prominence without congestive failure or focal infiltrate. ReadingStation:[id number]    Xr Knee 1 Or 2 Views Right    Result Date: 06/02/2018  1. Small suprapatellar joint effusion and diffuse soft tissue swelling of the thigh without acute bony pathology. ReadingStation:[email or web address]    Xr Femur Right 1 View (see Comments)    Result Date: 05/29/2018  1. Appropriate positioning of the new right femoral prosthesis. 2. Right femoral diaphyseal fracture immobilized with a longer stem and reinforced with 2 wire loops. ReadingStation:[email or web address]     Alease Medina, MD         06/03/18 3:39 PM   MRN: [id number]                                      CSN: [id number] DOB: May 27, 1956

## 2018-06-03 NOTE — H&P (Addendum)
HISTORY AND PHYSICAL  [facility]      Patient: [name]  Date: (Not on file)   DOB: Jul 01, 1956  Admission Date: (Not on file)   MRN: [id number]  Attending: Vernona Rieger, MD                              Assessment and Plan:     1. Physical debility after fall and periprosthetic right hip fracture s/p ORIF - admit for acute rehab.  Pt/ot/pmr consulted.  F/u with Dr Emeline Darling on July 16  2. DM2 - cont home regimen. Adjusted insulin as needed.  3. Post op blood loss anemia - s/p 4 units in hospital.  Recheck later this week.  4. 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  Will recheck later this week off salt and fluid restriction.   5.  Hx of afib/flutter, cad, htn - cont coreg, eliquis, norvasc  6.  Constipation proph - cont bowel regimen.        CODE Status: full Code    DVT prophylaxis: Eliquis      Chief complaint :  Weakness after leg injury    [name] is a 62 y.o.  male with CAD, s/p bypass, aflutter on Eliquis,  DM2 on oral meds, htn, OA    History of Present Illness:   Pt was admitted to [facility] 6/24-7/2.  Pt presented with right hip pain and right knee pain after falling. 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 Ivar Bury.  He was exercising/doing physical therapy and fell.  He landed on his right knee and then on his right hip.  Xray's revealed  periprosthetic femur fracture.  Pt was seen by Dr Emeline Darling and underwent ORIF.  Pt has post op anemia and required 2 units of prbc day after surgery and 2 units day of surgery.  Pt had mild hyponatremia as well felt likely to be SIADH, he was treated with salt and fluid restriction with improvement.  Pt tolerating oral intake,   had a bm yesterday.  Pt's pain is controlled on tylenol, ultram and oxycodone.  Pt is currently awake, alert, denies sob, no cp, no n/v. Pt has been doing well with PT. Sugars have been under fair control in hospital. Pt states sugars at home much better and recent hga1c was down in the 5 range.      Past  Medical History   Past Medical History:   Diagnosis Date   . Atrial flutter    . CAD (coronary artery disease)    . Carpal tunnel syndrome     H/O   . DM2 (diabetes mellitus, type 2)    . ED (erectile dysfunction)    . Encounter for blood transfusion    . Hypertension    . MI (myocardial infarction) '08, '11   . OA (osteoarthritis)    . Psoriasis        Past Surgical History  Past Surgical History:   Procedure Laterality Date   . ARTHROPLASTY, HIP, TOTAL, ANTERIOR APPROACH, C ARM Right 05/19/2018    Procedure: ARTHROPLASTY, HIP, TOTAL, ANTERIOR APPROACH, C ARM;  Surgeon: Len Childs, MD;  Location: [facility];  Service: Orthopedics;  Laterality: Right;  RIGHT TOTAL HIP REPLACEMENT, ASI    . ARTHROPLASTY, HIP, TOTAL, REVISION, ANTERIOR APPROACH, C ARM Right 05/29/2018    Procedure: ARTHROPLASTY, HIP, TOTAL, REVISION, ANTERIOR APPROACH,  C ARM;  Surgeon: Len Childs, MD;  Location: [facility];  Service: Orthopedics;  Laterality: Right;  RT REVISION PROSTETIC TOTAL HIP    . BICEPS TENDON REPAIR Right    . CARDIOVERSION     . CORONARY ARTERY BYPASS GRAFT  2011   . CYST REMOVAL      RIGHT CHEEK   . INGUINAL HERNIA REPAIR Right     AS CHILD   . KNEE ARTHROTOMY Left 1975   . ORIF, RADIUS, DISTAL (WRIST) Right    . SINUS SURGERY         Social History  Social History   Substance Use Topics   . Smoking status: Never Smoker   . Smokeless tobacco: Never Used   . Alcohol use Yes      Comment: occasional        Family History  Pt denies significant family hx.    Allergies  No Known Allergies    Prior to Admission Medications  No current facility-administered medications for this encounter.      No current outpatient prescriptions on file.     Facility-Administered Medications Ordered in Other Encounters   Medication Dose Route Frequency Provider Last Rate Last Dose   . acetaminophen (TYLENOL) tablet 650 mg  650 mg Oral Q4H PRN Len Childs, MD       . acetaminophen (TYLENOL)  tablet 650 mg  650 mg Oral Q4H Lucretia Kern Richrd Prime, MD   650 mg at 06/03/18 1652   . amLODIPine (NORVASC) tablet 10 mg  10 mg Oral Daily Roanna Epley, MD   10 mg at 06/03/18 0755   . apixaban (ELIQUIS) tablet 5 mg  5 mg Oral Q12H Gore, Darrel Reach, MD   5 mg at 06/03/18 0755   . bisacodyl (DULCOLAX) EC tablet 5 mg  5 mg Oral Daily Lucretia Kern Richrd Prime, MD   5 mg at 06/01/18 1043   . carvedilol (COREG) tablet 6.25 mg  6.25 mg Oral BID Meals Roanna Epley, MD   6.25 mg at 06/03/18 1651   . dextrose (D10W) 10% bolus 125 mL  125 mL Intravenous PRN Alease Medina, MD       . diphenhydrAMINE (BENADRYL) capsule 25 mg  25 mg Oral Q12H PRN Gore, Darrel Reach, MD       . diphenhydrAMINE (BENADRYL) capsule 25 mg  25 mg Oral QHS PRN Len Childs, MD       . gabapentin (NEURONTIN) capsule 100 mg  100 mg Oral [facility] Alease Medina, MD   100 mg at 06/03/18 1317   . glipiZIDE (GLUCOTROL) tablet 10 mg  10 mg Oral BID AC Roanna Epley, MD   10 mg at 06/03/18 1651   . glucagon (rDNA) (GLUCAGEN) injection 1 mg  1 mg Intramuscular PRN Alease Medina, MD       . hydrALAZINE (APRESOLINE) injection 10 mg  10 mg Intravenous Q6H PRN Gonzales-Alvarez, Epimenio Foot, MD       . insulin lispro (HumaLOG) injection pen 2-16 Units  2-16 Units Subcutaneous TID [facility] Alease Medina, MD   3 Units at 06/03/18 1651    And   . insulin lispro (HumaLOG) injection pen 1-9 Units  1-9 Units Subcutaneous QHS Alease Medina, MD   1 Units at 06/02/18 2125    And   . insulin lispro (HumaLOG) injection pen 1-9 Units  1-9 Units Subcutaneous Daily PRN Alease Medina, MD   1  Units at 06/03/18 0329   . lactobacillus species (BIO-K PLUS) capsule 50 Billion CFU  50 Billion CFU Oral Daily Alease Medina, MD   50 Billion CFU at 06/03/18 1018   . losartan (COZAAR) tablet 50 mg  50 mg Oral Daily Roanna Epley, MD   50 mg at 06/03/18 0756   .  metFORMIN (GLUCOPHAGE) tablet 500 mg  500 mg Oral BID Meals Alease Medina, MD   500 mg at 06/03/18 1651   . morphine injection 2 mg  2 mg Intravenous Q2H PRN Len Childs, MD   2 mg at 06/02/18 0848   . naloxone [facility]) injection 0.4 mg  0.4 mg Intravenous PRN Len Childs, MD       . ondansetron [facility]) injection 4 mg  4 mg Intravenous Q6H PRN Gweneth Dimitri Cary, [place]   4 mg at 05/29/18 0350   . ondansetron (ZOFRAN-ODT) disintegrating tablet 4 mg  4 mg Oral Q8H PRN Gore, Darrel Reach, MD        Or   . ondansetron [facility]) injection 4 mg  4 mg Intravenous Q8H PRN Len Childs, MD       . oxyCODONE (ROXICODONE) immediate release tablet 10 mg  10 mg Oral Q3H PRN Len Childs, MD   10 mg at 06/03/18 1516   . oxyCODONE (ROXICODONE) immediate release tablet 5 mg  5 mg Oral Q3H PRN Len Childs, MD       . polyethylene glycol [facility]) packet 17 g  17 g Oral BID Lucretia Kern Richrd Prime, MD   17 g at 06/03/18 1018   . promethazine (PHENERGAN) tablet 25 mg  25 mg Oral Q6H PRN Gore, Darrel Reach, MD        Or   . promethazine (PHENERGAN) suppository 12.5 mg  12.5 mg Rectal Q6H PRN Len Childs, MD        Or   . promethazine (PHENERGAN) IM injection 6.25 mg  6.25 mg Intramuscular Q6H PRN Len Childs, MD       . SITagliptin (JANUVIA) tablet 50 mg  50 mg Oral BID Alease Medina, MD   50 mg at 06/03/18 1704   . sodium chloride (PF) 0.9 % injection 3 mL  3 mL Intravenous Q8H Roanna Epley, MD   3 mL at 06/03/18 0617   . tiZANidine (ZANAFLEX) tablet 4 mg  4 mg Oral BID Roanna Epley, MD   4 mg at 06/03/18 1704   . traMADol (ULTRAM) tablet 50 mg  50 mg Oral 4 times per day Lucretia Kern Richrd Prime, MD   50 mg at 06/03/18 1704           Review of Systems  Except pertinent positives and negatives as mentioned in the history of present illness and past medical history, all other review of  systems including constitutional, eyes, ENT, cardiovascular, respiratory, gastrointestinal, genitourinary, psychiatric, neurologic, integumentary, musculoskeletal and allergic/hematology are reviewed and found unremarkable.       Physical Exam  There were no vitals taken for this visit.    Intake/Output Summary (Last 24 hours) at 06/03/18 1722  Last data filed at 06/03/18 1452   Gross per 24 hour   Intake             1220 ml   Output             2000 ml   Net             -  780 ml     Weight Monitoring 02/18/2018 05/06/2018 05/19/2018 05/26/2018 05/26/2018   Height 193 cm 193 cm 193 cm - 193 cm   Height Method Stated Stated Stated - -   Weight 141.522 kg 140.615 kg 136.1 kg 135 kg 135.5 kg   Weight Method Actual Stated - Actual Bed Scale   BMI (calculated) 38.1 kg/m2 37.8 kg/m2 36.6 kg/m2 - 36.4 kg/m2         1) General appearance:  Moderately built, in no apparent acute cardiorespiratory distress.     2) HEENT: Head is atraumatic and normocephalic. Pupils are equally reactive to light and accommodation. Extraocular muscles are intact. Patient has intact external auditory canal. No abnormal lesions or bleeding from nose. Oral mucosa moist with no pharyngeal congestion.     3) Neck: Supple. Trachea is central, no JVD or carotid bruit.     4) Chest: Clear to auscultation bilaterally, no wheezing, non labored breathing     6) CVS: The S1, S2 normal. Regular rate and rhythm. No murmur    7) Abdomen:  soft, non tender, no palpable mass. Bowel sounds audible. No hepatosplenomegaly.      8) Musculoskeletal: No pitting edema legs. No clubbing or cyanosis bilateral lower extremities. Expected range of motion in all four extremities.     9) Neurological: Cranial nerves II-XII intact. Grossly non focal    10) Psychiatric: Alert and oriented x 3. Mood is appropriate.     11) Integumentary: warm, moist with normal skin turgor, no rash.  Bruising noted right upper thigh.  Bandages c/d/i.     12) Lymphatics: No lymphadenopathy in  axillary, cervial and inguinal area.       Diagnostic work up:     Labs:     Recent Labs  Lab 06/03/18  0538 06/02/18  1032   WBC 9.8 11.9*   RBC 2.61* 2.82*   Hemoglobin 8.9* 9.8*   Hematocrit 26.5* 28.7*   MCV 102* 102*   PLT CT 419 469*         Recent Labs  Lab 06/02/18  1032 06/01/18  0656   Sodium 135* 133*   Potassium 4.7 4.7   Chloride 102 101   CO2 24.1 24   BUN 17 18   Creatinine 0.82 0.68*   Glucose 178* 168*   Calcium 9.2 8.8                     Recent Labs  Lab 05/28/18  0956   PT INR 1.1       Invalid input(s): HBA1CPERCNT  Imaging Studies:     Xr Chest 2 Views    Result Date: 06/01/2018  IMPRESSION: Minimal vascular prominence without congestive failure or focal infiltrate. ReadingStation:[id number]    Xr Hip Right 2-3 Vw Without Pelvis    Result Date: 05/26/2018  Acute fracture involving the mid femur adjacent to the distal tip of the femoral prosthesis. ReadingStation:[id number]    Xr Knee 1 Or 2 Views Right    Result Date: 06/02/2018  1. Small suprapatellar joint effusion and diffuse soft tissue swelling of the thigh without acute bony pathology. ReadingStation:[email or web address]    Xr Knee Right 4+ Views    Result Date: 05/26/2018  1.  No acute fracture is identified.  ReadingStation:[id number]    Xr Pelvis Limited 1 Or 2 Views    Result Date: 05/19/2018  No acute abnormality. ReadingStation:[email or web address]    Ct Hip Right Wo Contrast    Result Date: 05/27/2018  Postoperative change of right total hip replacement with right femur fracture involving the greater trochanteric region extending into the proximal and mid diaphyseal region of the right femur with lateral displacement of the fracture fragment distal to the shaft of the femoral prosthesis. Acetabular component appears in place with no acetabular fracture identified. ReadingStation:[email or web address]    US Venous Leg Right    Result Date: 05/26/2018  No acute deep vein thrombosis is identified. ReadingStation:[id number]    Xr Femur Right 1 View (see Comments)    Result  Date: 05/29/2018  1. Appropriate positioning of the new right femoral prosthesis. 2. Right femoral diaphyseal fracture immobilized with a longer stem and reinforced with 2 wire loops. ReadingStation:[id number]          NASSIM COSMA  06/03/2018    5:22 PM    Was a complete drug regimen review completed: YES/NO/NA  Were potential medication issues identified during review: NO/YES/NA  If there was an issue identified, was the physician (or physician-designee) contacted by midnight of the next day and completed prescribed/recommended actions in response to identified issue? YES/NO/NA

## 2018-06-03 NOTE — Plan of Care (Addendum)
Problem: Pain interferes with ability to perform ADL  Goal: Pain at adequate level as identified by patient  Outcome: Progressing   06/03/18 2323   Goal/Interventions addressed this shift   Pain at adequate level as identified by patient Assess pain on admission, during daily assessment and/or before any "as needed" intervention(s);Reassess pain within 30-60 minutes of any procedure/intervention, per Pain Assessment, Intervention, Reassessment (AIR) Cycle;Evaluate if patient comfort function goal is met;Include patient/patient care companion in decisions related to pain management as needed;Consult/collaborate with Physical Therapy, Occupational Therapy, and/or Speech Therapy;Offer non-pharmacological pain management interventions;Evaluate patient's satisfaction with pain management progress     NURSE NOTE SUMMARY     Patient Name: [name]   Attending Physician: Vernona Rieger, MD   Primary Care Physician: Md, Out Of Network   Date of Admission:   06/03/2018   Today's date:   06/03/2018 LOS: 0 days   Shift Summary:                                                              Patient complains of 9/10 pain to the right leg. Oxycodone 10mg  PRN given. Tramadol and tylenol scheduled. Ice pack provided. Patient seems to have difficulty with pain management. Urinal with in reach. Oriented to room. Call bell with in reach and bed alarm set.   FS at 0300 153, received 1 unit SSI. Pain now controlled at 4/10. Patient is comfortable.   Provider Notifications:      Rapid Response Notifications:  Mobility:        PMP Activity: Step 6 - Walks in Room (06/03/2018 12:00 PM)     Weight tracking:  Family Dynamic:     Last 3 Weights for the past 72 hrs (Last 3 readings):   Weight   06/03/18 1929 141 kg (310 lb 13.6 oz)       Recent Vitals:  Active Problems:     BP 130/66   Pulse 83   Temp 98.2 F (36.8 C) (Oral)   Resp 20   Ht 1.93 m (6\' 4" )   Wt 141 kg (310 lb 13.6 oz)   SpO2 96%   BMI 37.84 kg/m          Active  Problems:    Physical debility

## 2018-06-03 NOTE — Progress Notes (Signed)
Discharge Planner Discharge Note  [facility]   [address]   [place] [address]       06/03/18 1607   CM Review   CM Comments 06/03/18 DCP: Patient is ready for discharge, per attending physician. [facility] received insurance authorization. Patient agreeable to cost of wheelchair Zenaida Niece transport via VMT. Scheduled wheelchair Zenaida Niece transport via VMT for 5:30 PM pick up. Notified MD, nursing, facility and patient. See notes.        06/03/18 1610   Discharge Disposition   Patient preference/choice provided? Yes   Physical Discharge Disposition Acute Rehab   Receiving facility, unit and room number: [facility]   Nursing report phone number: [phone]   Mode of Transportation Wheelchair Tamala Bari up time 5:30 PM via VMT wheelchair Zenaida Niece transport   Patient/Family/POA notified of transfer plan Yes;Patient informed only   Patient agreeable to discharge plan/expected d/c date? Yes   Bedside nurse notified of transport plan? Yes   Hard copy of narcotic RX sent with patient? No   IV antibiotics post discharge? No   Wound care post discharge? Yes   Medicare Checklist   Is this a Medicare patient? No     Judd Gaudier, BSW  Discharge Planner Social Worker  [phone]

## 2018-06-03 NOTE — Progress Notes (Signed)
Pt rested in between care.  Pain was controlled with oral medications.  Dressings are dry and intact.  IV intact.  Pt is voiding WNL in the urinal.  Neuro-vascular assessment is intact.  Vital  signs reviewed:    Temp: 98.2 F (36.8 C), Heart Rate: 75, Resp Rate: 16, BP: 120/63  Call bell within reach.  Will continue to monitor pt on rounds.

## 2018-06-03 NOTE — Plan of Care (Signed)
Problem: Compromised Tissue integrity  Goal: Damaged tissue is healing and protected  Outcome: Progressing   06/02/18 2000   Goal/Interventions addressed this shift   Damaged tissue is healing and protected  Monitor/assess Braden scale every shift;Reposition patient every 2 hours and as needed unless able to reposition self;Relieve pressure to bony prominences for patients at moderate and high risk;Avoid shearing injuries;Keep intact skin clean and dry;Use bath wipes, not soap and water, for daily bathing;Monitor external devices/tubes for correct placement to prevent pressure, friction and shearing;Encourage use of lotion/moisturizer on skin;Monitor patient's hygiene practices       Problem: Moderate/High Fall Risk Score >5  Goal: Patient will remain free of falls  Outcome: Progressing   06/02/18 2000   High Risk Falls Interventions (Greater than 13)   VH High Risk (Greater than 13) ALL REQUIRED LOW INTERVENTIONS;ALL REQUIRED MODERATE INTERVENTIONS;RED "HIGH FALL RISK" SIGNAGE;BED ALARM WILL BE ACTIVATED WHEN THE PATEINT IS IN BED WITH SIGNAGE "RESET BED ALARM";A CHAIR PAD ALARM WILL BE USED WHEN PATIENT IS UP SITTING IN A CHAIR;PATIENT IS TO BE SUPERVISED FOR ALL TOILETING ACTIVITIES;Keep door open for better visibility;Include family/significant other in multidisciplinary discussion regarding plan of care as appropriate;Use assistive devices;Use of "STOP ask for help" sign;Use chair-pad alarm device       Problem: Pain interferes with ability to perform ADL  Goal: Pain at adequate level as identified by patient  Outcome: Progressing   06/02/18 2000   Goal/Interventions addressed this shift   Pain at adequate level as identified by patient Identify patient comfort function goal;Assess for risk of opioid induced respiratory depression, including snoring/sleep apnea. Alert healthcare team of risk factors identified.;Assess pain on admission, during daily assessment and/or before any "as needed"  intervention(s);Reassess pain within 30-60 minutes of any procedure/intervention, per Pain Assessment, Intervention, Reassessment (AIR) Cycle;Evaluate if patient comfort function goal is met;Evaluate patient's satisfaction with pain management progress;Offer non-pharmacological pain management interventions;Include patient/patient care companion in decisions related to pain management as needed       Problem: Impaired Mobility  Goal: Mobility/Activity is maintained at optimal level for patient  Outcome: Progressing   06/02/18 2000   Goal/Interventions addressed this shift   Mobility/activity is maintained at optimal level for patient Increase mobility as tolerated/progressive mobility;Encourage independent activity per ability;Maintain proper body alignment;Perform active/passive ROM;Plan activities to conserve energy, plan rest periods;Reposition patient every 2 hours and as needed unless able to reposition self;Assess for changes in respiratory status, level of consciousness and/or development of fatigue       Problem: Peripheral Neurovascular Impairment  Goal: Extremity color, movement, sensation are maintained or improved  Outcome: Progressing   06/02/18 2000   Goal/Interventions addressed this shift   Extremity color, movement, sensation are maintained or improved  Increase mobility as tolerated/progressive mobility;Assess extremity for proper alignment;Teach/review/reinforce ankle pump exercises;VTE Prevention: Administer anticoagulant(s) and/or apply anti-embolism stockings/devices as ordered

## 2018-06-04 DIAGNOSIS — S728X1D Other fracture of right femur, subsequent encounter for closed fracture with routine healing: Secondary | ICD-10-CM

## 2018-06-04 DIAGNOSIS — Z7409 Other reduced mobility: Secondary | ICD-10-CM

## 2018-06-04 DIAGNOSIS — R52 Pain, unspecified: Secondary | ICD-10-CM

## 2018-06-04 DIAGNOSIS — R5381 Other malaise: Secondary | ICD-10-CM

## 2018-06-04 LAB — VH DEXTROSE STICK GLUCOSE
Glucose POCT: 153 mg/dL — ABNORMAL HIGH (ref 71–99)
Glucose POCT: 159 mg/dL — ABNORMAL HIGH (ref 71–99)
Glucose POCT: 220 mg/dL — ABNORMAL HIGH (ref 71–99)
Glucose POCT: 150 mg/dL — ABNORMAL HIGH (ref 71–99)
Glucose POCT: 192 mg/dL — ABNORMAL HIGH (ref 71–99)

## 2018-06-04 NOTE — Progress Notes (Signed)
[facility]  Physical Therapy  Evaluation      Patient Name: [name], [name]         MRN:   [id number]    Department:  REHABILITATION UNIT        Room:  3307/3307-A  Date:   06/04/2018    Medical Diagnosis     Encounter for other specified aftercare [Z51.89]  S/P ORIF (open reduction internal fixation) fracture [Z96.7, Z87.81]    Patient Active Problem List   Diagnosis   . Osteoarthritis of right hip   . Femur fracture, right   . CAD (coronary artery disease)   . AF (atrial fibrillation)   . DM (diabetes mellitus)   . HTN (hypertension)   . Physical debility       Past Medical/Surgical History     Past Medical History:   Diagnosis Date   . Atrial flutter    . CAD (coronary artery disease)    . Carpal tunnel syndrome     H/O   . DM2 (diabetes mellitus, type 2)    . ED (erectile dysfunction)    . Encounter for blood transfusion    . Hypertension    . MI (myocardial infarction) '08, '11   . OA (osteoarthritis)    . Psoriasis       Past Surgical History:   Procedure Laterality Date   . ARTHROPLASTY, HIP, TOTAL, ANTERIOR APPROACH, C ARM Right 05/19/2018    Procedure: ARTHROPLASTY, HIP, TOTAL, ANTERIOR APPROACH, C ARM;  Surgeon: Len Childs, MD;  Location: [facility];  Service: Orthopedics;  Laterality: Right;  RIGHT TOTAL HIP REPLACEMENT, ASI    . ARTHROPLASTY, HIP, TOTAL, REVISION, ANTERIOR APPROACH, C ARM Right 05/29/2018    Procedure: ARTHROPLASTY, HIP, TOTAL, REVISION, ANTERIOR APPROACH, C ARM;  Surgeon: Len Childs, MD;  Location: [facility];  Service: Orthopedics;  Laterality: Right;  RT REVISION PROSTETIC TOTAL HIP    . BICEPS TENDON REPAIR Right    . CARDIOVERSION     . CORONARY ARTERY BYPASS GRAFT  2011   . CYST REMOVAL      RIGHT CHEEK   . INGUINAL HERNIA REPAIR Right     AS CHILD   . KNEE ARTHROTOMY Left 1975   . ORIF, RADIUS, DISTAL (WRIST) Right    . 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   Home Living Arrangements   Living Arrangements Alone  (sister and her boyfriend available in the evenings)   Type of Home Apartment  (1st floor)   Home Layout One level;Performs ADL's on one level   Bathroom Accessibility Accessible via wheelchair;Accessible via walker   Home Living  Comments handicap accessible    Subjective   Subjective Statement "I feel pretty good right now."   Patient Goal "I would like to work on bathroom, showering, shaving, and getting home."   Therapy Vital Signs   Blood Pressure 122/57   Heart Rate 86   O2 100  (RA)   Cognition   Behavior calm;cooperative   Following Commands Follows all commands and directions without difficulty   Neuro Status   Coordination intact   Hand Dominance right handed   Pain Assessment   Pain Assessment Numeric Scale (0-10)   Pain Score 4   Pain Location Leg;Back   Pain Orientation Right   Pain Descriptors Aching   Pain Intervention(s) Cold applied   Skin Integrity   Skin Integrity RLE edema and bruising around surgical site.    Sensory    Light touch inconsistent results below the ankles bilaterally   5.07 Monofilament  0/5 bilaterally    Proprioception 5/5 Great toe find bilaterally   Gross ROM   Right Upper Extremity ROM WFL   Left Upper Extremity ROM WFL   R Knee Flexion  0-140 44   Left Lower Extremity ROM WFL   Gross Strength   L Hip Flexion 4+/5   L Knee Flexion 5/5   L Knee Extension 5/5   L Ankle Dorsiflexion 5/5   Functional Mobility   Roll to Left UTA   Roll to Right UTA   Lying to Sitting Minimal Assist   Sitting to Lying Minimal Assist   Sit to Stand Moderate Assist   Stand to Sit Minimal Assist   Transfer Bed/Wheelchair/Chair - Wheelchair   Device/Technique Stand pivot;FWW   Bed Transfer Score  2a-Patient requires assistance to manage trunk and one leg in and/or out of bed   Bed Transfer Goal Score 6-Mod I   Other Transfers   Floor transfer  (not performed)   Car transfer  (not performed)   Balance   Sitting - Static Good   Sitting - Dynamic Fair   Standing - Static Fair   Standing - Dynamic Fair   Balance Comments Patient is able to stand for 30 seconds and no LUE support without LOB or lateral sway. He is able to stand without RUE for 30 seconds, but does demonstrate increased lateral sway when asked to reach outside of BOS with RUE.    Locomotion Ambulation   Walking Distance 1- equals less than 74ft   Goal Walking Distance  1- equals less than 7ft   Walking Score 1-Patient ambulates less than 50 feet   Walking Goal Score  6-Mod I   Ambulation Level of Assist/Device Minimal Assist;with front-wheeled walker   Ambulation Distance (Feet) 5 Feet   Gait Speed (M/Sec) (unable to assess at this time due to seated rest breaks)   Gait Pattern 3 point   Locomotion Wheelchair   Wheelchair Level of Assist Independent   Wheelchair Device/Method Manual   Wheelchair Distance 3 - greater than or equal to 150 ft   Goal Wheelchair Distance  3 - greater than or equal to 150 ft   Wheelchair Score 6-Patient propels wheelchair a minimum of 150 feet independently, safely   Wheelchair Goal Score 7-Independent   Wheelchair Parts Management Patient locks (R) brakes;Patient locks (L) brakes;Patient lifts (L) foot rest;Helper lifts (R) foot rest;Helper removes (R) arm rest;Helper removes (L) arm  rest   Locomotion Other   Curb Not performed   Ramp Not performed   Stair Management  Stairs score 0-Activity did not occur upon evaluation   Patient Education   Patient Education Patient was educated on rehab process and goals for physical therapy.    Assessment   Strengths independent PLOF;active participant;owns equipment;good UE control;good UE strength;vision intact   Limitations impaired activity tolerance;impaired sensation;impaired LB strength;impaired standing balance;pain limiting activity   Prognosis Good;With continued PT status post acute discharge   Barriers to discharge uncertain how often help will be available at home   Assessment Mr. Marinaro is a 62 y.o. Male with a PMH of A-fib, CAD s/p CABG, DM, and HTN. He recently underwent a right hip arthroplasty on 6/17 for chronic arthritis and was d/c home. On 6/24 patient suffered a fall at home and landed on his right side with significant right leg pain. Presented to the ED and an Xray of right hip shower femur fracture below prothesis level. CT of right hip with no acetabular involvement. Patient underwent ORIF of right periprosthetic femur fracture with cables and placement of a long-stem implant on 6/27. Patient's hospital stay was complicated by anemia with 2 units of PRBC's and is currently NWB to RLE.     Prior to admission patient was modified independent ans was ambulating primarily with a SPC and occasionally a FWW at the household level. Patient was requiring assistance for certain household duties.      Mr. Bunton is currently presenting with the above listed impairments. He is currently compliant with WB restrictions. He is able to perform a SPT with FWW and Min A. He is unable to roll to his left or right at this time, but was able to perform supine <> sit with Min A and assistance for positioning R LE. Patient is able to perform sit <> stand with Min A inconsistently, requiring Mod A with increased fatigue levels. Patient is able to  ambulate 5 feet with FWW and Min A. He is currently able to stand without support from one UE for 30 seconds, however patient demonstrates increased lateral sway when reaching outside of BOS with R UE. Patient will benefit from skilled physical therapy during admission to address the above listed impairments and allow for safe d/c home.      Goals   New Short Term Goals To be completed by 06/11/18; Patient will be able to complete supine <> sit with supervision. 2) Patient will be able to complete a SPT with consistent Min A and FWW. 3) Patient will be able to ambulate 25 feet with Min A and FWW. 4) Patient will be able to stand with alternating UE support for 30 seconds to demonstrate ability to perform dynamic standing ALDs   Long Term Goals To be completed by 06/04/09; Patient will be able to complete all aspects of bed mobility with Mod I. 2) Patient will be able to perform SPT with FWW and supervision. 3) Patient will be able to ambulate 50 feet with FWW and Mod I. 4) Patient will be able to complete a car transfer with FWW and supervision to allow for safe d/c home.    Plan   Frequency 5-6 days per week, twice a day, 1.5 hours a day   Treatment/Interventions Exercise;Gait training;Neuromuscular re-education;Functional transfer training;LE strengthening/ROM;Endurance training;Equipment eval/education;Bed mobility   Discharge Recommendation Home with home health PT   Discharge Recommendation Home with home health PT   Estimated Length of Stay (Days) 14   Discharge Date (TBS)     Philip Argentieri, SPT  ______________________________________________________________________    I have reviewed  and agree with the documentation authored by my student of the evaluation/ treatment/education/care plan delivered during my direct supervision.     Co-Signed by:  Macario Carls, PT  ______________________________________________________________________

## 2018-06-04 NOTE — Progress Notes (Signed)
MEDICAL PROGRESS NOTE  [facility]      Patient: [name]  Date: 06/04/2018   LOS: 1 Days  Admission Date: 06/03/2018   MRN: [id number]  Attending: Ronald Lobo MD     ASSESSMENT/PLAN     [name] is a 62 y.o. male with PMH significant for CAD, s/p bypass, aflutter on Eliquis,  DM2 on oral meds, htn, OA     Patient was hospitalized at [facility] 6/24-7/2.  Pt presented with right hip pain and right knee pain after falling. 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 Ivar Bury. He was exercising/doing physical therapy and fell. He landed on his right knee and then on his right hip. Xray's revealed  periprosthetic femur fracture.  Pt was seen by Dr Emeline Darling and underwent ORIF.  Pt has post op anemia and required 2 units of prbc.  Pt had mild hyponatremia as well felt likely to be SIADH, he was treated with salt and fluid restriction with improvement.  Pt tolerating oral intake,   had a bm yesterday.  Pt's pain is controlled on tylenol, ultram and oxycodone.  Pt is currently awake, alert, denies sob, no cp, no n/v. Pt has been doing well with PT. Sugars have been under fair control in hospital. Pt states sugars at home much better and recent hga1c was down in the 5 range.       1. Physical debility after fall and periprosthetic right hip fracture s/p ORIF - admit for acute rehab.  Pt/ot/pmr consulted.  F/u with Dr Emeline Darling on July 16  2. DM2 - cont home regimen. Adjusted insulin as needed.  Sugars currently acceptable.   3. Post op blood loss anemia - s/p 2 units after surgery and had 2 units day of surgery.  Recheck later this week.  4. 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  Will recheck later this week off salt and fluid restriction.   5.  Hx of afib/flutter, cad, htn - cont coreg, eliquis, norvasc.  Elevated bp/pulse this am in setting of pain, and hadn't received his coreg or norvasc yet.    6.  Constipation proph - cont bowel regimen.  Last bm 7/2.           DVT/VTE  Prophylaxis:  Eliquis    Follow up:  Dr Emeline Darling July 16    Care Plan discussed with nursing, consultants, case manager when possible    SUBJECTIVE     Pt had uneventful evening except bouts of pain.  Doing ok today.  No sob, no n/v, no cp, tolerating po's, moving bowels, pain fairly controlled.        Therapy Current Functional Levels:            Ambulation Distance: (P) 0 feet  Ambulation Level of Assist: (P) Not Attempted  Bed/Chair/Wheelchair Level of Assist: (P) Min Assist       MEDICATIONS     Current Facility-Administered Medications   Medication Dose Route Frequency   . acetaminophen  650 mg Oral Q4H   . amLODIPine  10 mg Oral Daily   . apixaban  5 mg Oral Q12H   . bisacodyl  5 mg Oral Daily   . carvedilol  6.25 mg Oral BID Meals   . gabapentin  100 mg Oral Q8H SCH   . glipiZIDE  10 mg Oral BID AC   . insulin lispro  2-16 Units Subcutaneous TID AC    And   .  insulin lispro  1-9 Units Subcutaneous QHS   . losartan  50 mg Oral Daily   . metFORMIN  500 mg Oral BID Meals   . polyethylene glycol  17 g Oral BID   . SITagliptin  50 mg Oral BID   . sodium chloride (PF)  3 mL Intravenous Q8H   . tiZANidine  4 mg Oral BID   . traMADol  50 mg Oral 4 times per day       ROS     Remainder of 10 point ROS as above or otherwise negative    PHYSICAL EXAM     Vitals:    06/04/18 0829   BP: (!) 167/95   Pulse: (!) 112   Resp:    Temp:    SpO2:        Temperature: Temp  Min: 98.2 F (36.8 C)  Max: 99.3 F (37.4 C)  Pulse: Pulse  Min: 83  Max: 112  Respiratory: Resp  Min: 20  Max: 22  Non-Invasive BP: BP  Min: 126/68  Max: 167/95  Pulse Oximetry SpO2  Min: 93 %  Max: 100 %    Intake and Output Summary (Last 24 hours) at Date Time    Intake/Output Summary (Last 24 hours) at 06/04/18 1233  Last data filed at 06/04/18 0900   Gross per 24 hour   Intake              360 ml   Output              957 ml   Net             -597 ml      Wt Readings from Last 3 Encounters:   06/03/18 141 kg (310 lb 13.6 oz)   05/26/18 135.5 kg (298 lb 11.6  oz)   05/19/18 136.1 kg (300 lb 0.7 oz)         GEN APPEARANCE: NAD;    HEENT: Conjunctiva Clear; Mucous membranes moist  CVS: RRR, S1, S2; No M/G/R  LUNGS: CTAB; No Wheezes; No Rhonchi: No rales   ABD: Soft; Non-tender, + Normoactive BS   EXT: right leg edema, bruising; Pulses 2+ and intact.  Staples are intact, wound healing.   SKIN: No rash or Lesions  NEURO: No Focal neurological deficits      LABS       Recent Labs  Lab 06/03/18  0538 06/02/18  1032 06/01/18  0656   WBC 9.8 11.9* 9.6   RBC 2.61* 2.82* 2.57*   Hemoglobin 8.9* 9.8* 8.8*   Hematocrit 26.5* 28.7* 26.0*   MCV 102* 102* 101*   PLT CT 419 469* 351         Recent Labs  Lab 06/02/18  1032 06/01/18  0656 05/31/18  0557 05/30/18  0448   Sodium 135* 133* 130* 132*   Potassium 4.7 4.7 4.3 4.9   Chloride 102 101 101 101   CO2 24.1 24 22.4 26.5   BUN 17 18 15 22    Creatinine 0.82 0.68* 0.71* 0.88   Glucose 178* 168* 125* 126*   Calcium 9.2 8.8 8.3* 7.9*         Recent Labs  Lab 05/31/18  1510   Osmolality, UR 771   Sodium, UR 44                         (!) 220 (The above 1 analytes were performed by [facility]  [facility] [id number],[place] [address] )    Microbiology, reviewed and significant for:  No results for input(s): MICRO in the last 24 hours.      RADIOLOGY     Radiological Procedure reviewed.    No orders to display       Signed,  Ronald Lobo, MD  12:33 PM 06/04/2018

## 2018-06-04 NOTE — Progress Notes (Signed)
[facility]  Occupational Therapy  Evaluation    Patient Name: [name], [name]         MRN:   [id number]    Department:  REHABILITATION UNIT        Room:  3307/3307-A  Date:   06/04/2018    Medical Diagnosis     Encounter for other specified aftercare [Z51.89]  S/P ORIF (open reduction internal fixation) fracture [Z96.7, Z87.81]    Patient Active Problem List   Diagnosis   . Osteoarthritis of right hip   . Femur fracture, right   . CAD (coronary artery disease)   . AF (atrial fibrillation)   . DM (diabetes mellitus)   . HTN (hypertension)   . Physical debility       Past Medical/Surgical History     Past Medical History:   Diagnosis Date   . Atrial flutter    . CAD (coronary artery disease)    . Carpal tunnel syndrome     H/O   . DM2 (diabetes mellitus, type 2)    . ED (erectile dysfunction)    . Encounter for blood transfusion    . Hypertension    . MI (myocardial infarction) '08, '11   . OA (osteoarthritis)    . Psoriasis       Past Surgical History:   Procedure Laterality Date   . ARTHROPLASTY, HIP, TOTAL, ANTERIOR APPROACH, C ARM Right 05/19/2018    Procedure: ARTHROPLASTY, HIP, TOTAL, ANTERIOR APPROACH, C ARM;  Surgeon: Len Childs, MD;  Location: [facility];  Service: Orthopedics;  Laterality: Right;  RIGHT TOTAL HIP REPLACEMENT, ASI    . ARTHROPLASTY, HIP, TOTAL, REVISION, ANTERIOR APPROACH, C ARM Right 05/29/2018    Procedure: ARTHROPLASTY, HIP, TOTAL, REVISION, ANTERIOR APPROACH, C ARM;  Surgeon: Len Childs, MD;  Location: [facility];  Service: Orthopedics;  Laterality: Right;  RT REVISION PROSTETIC TOTAL HIP    . BICEPS TENDON REPAIR Right    . CARDIOVERSION     . CORONARY ARTERY BYPASS GRAFT  2011   . CYST REMOVAL      RIGHT CHEEK   . INGUINAL HERNIA REPAIR Right     AS CHILD   . KNEE ARTHROTOMY Left 1975   . ORIF, RADIUS, DISTAL (WRIST) Right    . SINUS SURGERY         Flow Sheet Data          06/04/18 1000   Documentation type:    Type  Evaluation   Rehab Therapy Precautions   Weight Bearing Status RLE non weight bearing   Total Hip Replacement no external rotation;no extension   Other Precautions Falls   Subjective   Subjective Statement "Get well as fast as possible"   Patient Goal "Get well as fast as possible"   Prior Level of Function   Prior level of function Independent with ADLs;Ambulates independently   Baseline Activity Level Community ambulation   Work Visual merchandiser for self   Driving independent   Home Living Arrangements   Living Arrangements Alone   Type of Home Apartment   Home Layout One level   Type of  Shower/Tub Walk-in shower   Type of  Toilet Raised   Regulatory affairs officer   DME Currently at [employer] Transfer TBE   Home Management    Home Management Comments sister helps with house cleaning and laundry weekly  Cognition   Behavior attentive;calm cooperative   RLAS  Level X   Cognition Comments Pt reports he did not hit head during fall;alert, orientedx3, follows 2 step directions, engages in normal conversation   Comprehension   Comprehension Type  Both   Devices/Techniques Glasses   Comprehension Score  6-Patient able to understand level 7 ideas with use of device   Goal Score Comprehension 6-Mod I   Expression    Expression Type Both   Expression Score 7c-Patient discusses relationships with other people   Goal Score Expression 7-Independent   Problem solving    Problem Solving Score 7-Patient consistently recognizes all problems   Goal Score Problem Solving 7-Independent   Memory   Memory Score 7b-Patient executes requests without any cues   Goal Score Memory 7-Independent   Neuro Status   Motor Planning intact   Coordination intact   Hand Dominance right handed   Motor control   RUE WFL   RUE Finger to Nose Test  [facility]   RUE Finger Opposition  WFL   RUE Grip Strength  WFL   LUE WFL   LUE Finger to Nose Test  [facility]   LUE Finger Opposition  WFL   LUE  Grip Strength  WFL   Sensory   Proprioception WFL   Vision - Complex Assessment   Ocular Range of Motion WFL   Head Position WDL   Tracking Able to track stimulus in all quads without difficulty   Glasses yes   Acuity Able to read clock/calendar on wall without difficulty;Able to read employee name badge without difficulty;Able to read newspaper without difficulty;Able to read normal print without difficulty   Skin Integrity   Skin Integrity bruising RLE throughout, covered surgical wound RLE   Edema    Edema scale +2 Deeper pit less than 5mm   Edema Comments edema on RLE   Pain Assessment   Pain Assessment Numeric Scale (0-10)   Pain Score 4   Pain Location Hip;Leg  (R hip; L anterior calf)   Pain Orientation Right;Anterior   Pain Descriptors Aching   Gross ROM   Gross ROM WFL   Neck/Trunk ROM WFL   Right Upper Extremity ROM WFL   Left Upper Extremity ROM WFL   Gross Strength   Neck/Trunk Strength 5/5   Right Upper Extremity Strength 5/5   Left Upper Extremity Strength 5/5   Eating   Eating Device/Technique Regular untensil   Eating Score 7-Patient completes entire meal independently without any devices or modified diets   Goal Eating Score 7-Independent   Grooming    Hands Patient   Face Patient   Hair Patient   Oral Patient   Grooming Score 6-Patient requires more than a reasonable amount of time to complete desired grooming tasks   Goal Score Grooming  7   Bathing    Chest Patient   Right Arm Patient   Left Arm  Patient   Abdomen Patient   Front Perineal Area Helper   Back Perineal Areas (includes buttocks) Helper   Left Upper Leg Helper   Right Upper Leg Helper   Left Lower Leg (include foot) Helper   Right Lower Leg (include foot) Helper   Device/Techniques Standing at sink;Seated at sink   Bathing Score  2-Patient completes 25-49% of desired bathing tasks   Goal Score Bathing  6-Mod I   Dressing- Upper Body   Shirt 1 Right Arm  Patient   Shirt 1 Left Arm  Patient   Shirt  1 Overhead Patient   Shirt 1 Arrange  in Back Patient   Device/Techniques Seated at sink;Standing at sink   Dressing Score 6-Patient requires more than a reasonable amount of time to complete desired upper body dressing tasks   Goal Dressing Score  7-Independent   Dressing- Lower Body   Underwear Right Leg Helper   Underwear Left Leg Helper   Underwear Arrange Over Hips Helper   Pants Right Leg Helper   Pants Left Leg Helper   Pants Arrange Over Hips Helper   Right Sock Helper   Left Sock Helper   Device/Technique  Seated at sink;Standing at sink   Dressing Score  1a-Patient requires assistance of two or more people for completion of desired lower body dressing tasks   Goal Score Dressing  6-Mod1   Toileting    Pants Down Helper   Hygiene Helper   Pants Up  Helper   Device/Technique Walker (balance)   Toileting Score  1-Patient completes less than 25% of toileting tasks   Goal Score Toileting 6-Mod1   Toilet Transfers - Ambulatory   Toilet Transfer Score  0-Activity did not occur upon evaluation   Goal Score Toilet Transfer 6-Mod1   Toilet Transfers - Wheelchair   Approaches Bed or Chair Helper   Locks Brakes Helper   Lifts Foot Rests Helper   Device/Techniques-Wheelchair FWW   Toilet Transfer Score  2-Patient requires both lifting assistance to stand and lowering assistance to sit, but completes remainder of transfer themselves   Goal Score Toilet Transfer  6-Mod I   Tub/Shower Transfer- Ambulatory   Tub/Shower Transfer Score  0-Activity did not occur upon evaluation   Goal Score Tub/Shower Transfer 6-Mod I   Tub/Shower Transfers - Designer, jewellery Score  0 - Activity did not occur upon evaluation   Goal Score Tub/Shower Transfer 6-Mod I   Balance   Static Sitting Balance Independent   Dyanamic Sitting Balance Independent   Static Standing Balance Moderate Assist   Dynamic Standing Balance Moderate Assist   Balance Comments Pt requires cueing to keep weight off RLE   Leisure/Routine Participation   Leisure Interest target shooting;  visiting different restaurant   Leisure Participation personal hobby   Patient Education   Patient Education role of OT and expectations of rehab; NWB status; AT loan and education   Assessment   Strengths independent PLOF;active participant;cognition intact;owns equipment;good UE control;good UE strength;good sitting balance;vision intact;sensation intact;good family support   Limitations impaired standing balance;impaired LB strength   Prognosis Good for OT Goals   Assessment Comment Pt had a R total hip replacement on 05/19/18. During a PT session, pt fell and landed on R knee and then R hip. Pt was admitted to [facility] on 05/26/18 complaining of R knee and R hip pain. Xrays showed periprosthetic femur fracture and pt received an ORIF on R hip. Please see H&P for full hospital course and detailed PMH. Pt is alert and oriented x3. Pt is NWB on RLE and reports pain is a barrier to activity. Pt was I in ADLs and IADLs prior to admission. 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 Pt is currently limited in mobility aspects to perform self care and transfers due to [facility] status/precautions, edema, and pain. Pt is expected to benefit from OT services to facilitate improved functional performance with self care, transfers, light home mgmt with AE AT DME PRN.      OT Goals   New  Short Term Goals (1 week) 1) Pt to complete toilet transfer with Min A. 2) Pt to complete LB dressing with Mod A. 3) Pt to complete toileting activity with Min A   Long Term Goals By d/c pt to be at mod I level with self care, functional transfers, light home management with AE AT DME prn.   OT Plan   OT Frequency  2-3 X/day, 1.5 hours/day, 5-6 days/week   Treatment/Interventions ADL Training;Improving Balance/Tolerance;DME Training;Patient/Family Education;Techniques to Improve Skin/Joint Integrity;Techniques for Energy Conservation;Home Exercises;Transfer Training   Discharge Recommendation Home with no needs   DME  Recommended for Discharge (TBE)   Estimated Length of Stay (Days) 14       Rosendo Gros, OTR/L

## 2018-06-04 NOTE — Consults (Signed)
PHYSICAL MEDICINE & REHABILITATION   POST-ADMISSION ASSESSMENT AND CONSULTATION  PHYSICIAN ATTESTATION     [facility]      PATIENT INFORMATIONSAW, [name]  MEDICAL RECORD NUMBER  [id number]  ENCOUNTER NUMBER  [id number]     DATE OF ADMISSION: 06/03/2018   DATE OF CONSULTATION: 06/03/2018     Attending Physician: Vernona Rieger, MD  Primary Care Physician: Md, Out Of Network    Surgeon: Emeline Darling   Other specialist: Tressia Danas    CONSULTED BY:  Vernona Rieger, MD    REASON FOR CONSULTATION     POST-ADMISSION PHYSICIAN ASSESSMENT AND ATTESTATION  Assessment of functional and rehabilitative needs; general pain management; and medical complexities and co-morbidities as functional barriers to rehabilitation.       HISTORY OF PRESENT ILLNESS     The patient states that he came to [facility] for an elective right total hip replacement.  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  He was recovering well from the hip replacement when he had a fall landing on the right knee and suffered a periprosthetic hip fracture.  Patient underwent surgical repair and is currently nonweightbearing.  He does state that knee pain and generalized pain are somewhat limiting.  Last bowel movement was yesterday.  Post op required 4 Units PRBC and developed hyponatremia.        Patient was medically stabilized and transitioned to [facility] for comprehensive inpatient rehabilitative program.    ALLERGIES     No Known Allergies    MEDICATIONS       Current Facility-Administered Medications:   .  acetaminophen (TYLENOL) tablet 650 mg, 650 mg, Oral, Q4H, Carrick, Raphael Gibney, MD, 650 mg at 06/04/18 0523  .  acetaminophen (TYLENOL) tablet 650 mg, 650 mg, Oral, Q4H PRN, Carrick, Joziah S, MD  .  amLODIPine (NORVASC) tablet 10 mg, 10 mg, Oral, Daily, Carrick, Raphael Gibney, MD  .  apixaban (ELIQUIS) tablet 5 mg, 5 mg, Oral, Q12H, Carrick,  Raphael Gibney, MD, 5 mg at 06/03/18 2033  .  bisacodyl (DULCOLAX) EC tablet 5 mg, 5 mg, Oral, Daily, Carrick, Damek S, MD  .  carvedilol (COREG) tablet 6.25 mg, 6.25 mg, Oral, BID Meals, Carrick, Raphael Gibney, MD  .  diphenhydrAMINE (BENADRYL) capsule 25 mg, 25 mg, Oral, Q12H PRN, Carrick, Raphael Gibney, MD  .  diphenhydrAMINE (BENADRYL) capsule 25 mg, 25 mg, Oral, QHS PRN, Carrick, Raphael Gibney, MD  .  gabapentin (NEURONTIN) capsule 100 mg, 100 mg, Oral, Q8H SCH, Carrick, Raphael Gibney, MD, 100 mg at 06/04/18 0523  .  glipiZIDE (GLUCOTROL) tablet 10 mg, 10 mg, Oral, BID AC, Carrick, Raphael Gibney, MD  .  NSG Communication: Glucose POCT order (AC, HS), , , TID AC & HS **AND** insulin lispro (HumaLOG) injection pen 2-16 Units, 2-16 Units, Subcutaneous, TID AC **AND** insulin lispro (HumaLOG) injection pen 1-9 Units, 1-9 Units, Subcutaneous, QHS, 1 Units at 06/03/18 2038 **AND** insulin lispro (HumaLOG) injection pen 1-9 Units, 1-9 Units, Subcutaneous, Daily PRN, Vernona Rieger, MD, 1 Units at 06/04/18 0320  .  losartan (COZAAR) tablet 50 mg, 50 mg, Oral, Daily, Carrick, Yesenia S, MD  .  metFORMIN (GLUCOPHAGE) tablet 500 mg, 500 mg, Oral, BID Meals, Carrick, Raphael Gibney, MD  .  naloxone [facility]) injection 0.4 mg, 0.4 mg, Intravenous, PRN, Carrick, Raphael Gibney, MD  .  ondansetron (ZOFRAN-ODT) disintegrating tablet 4  mg, 4 mg, Oral, Q8H PRN **OR** ondansetron (ZOFRAN) injection 4 mg, 4 mg, Intravenous, Q8H PRN, Carrick, Earvin S, MD  .  ondansetron (ZOFRAN) injection 4 mg, 4 mg, Intravenous, Q6H PRN, Carrick, Raphael Gibney, MD  .  oxyCODONE (ROXICODONE) immediate release tablet 10 mg, 10 mg, Oral, Q3H PRN, Vernona Rieger, MD, 10 mg at 06/04/18 0141  .  oxyCODONE (ROXICODONE) immediate release tablet 5 mg, 5 mg, Oral, Q3H PRN, Carrick, Raphael Gibney, MD  .  polyethylene glycol (MIRALAX) packet 17 g, 17 g, Oral, BID, Carrick, Raphael Gibney, MD  .  promethazine (PHENERGAN) tablet 25 mg, 25 mg, Oral, Q6H PRN **OR** promethazine (PHENERGAN) suppository 12.5 mg, 12.5 mg, Rectal, Q6H PRN **OR**  promethazine (PHENERGAN) IM injection 6.25 mg, 6.25 mg, Intramuscular, Q6H PRN, Carrick, Samit S, MD  .  SITagliptin (JANUVIA) tablet 50 mg, 50 mg, Oral, BID, Carrick, Shankar S, MD  .  sodium chloride (PF) 0.9 % injection 3 mL, 3 mL, Intravenous, Q8H, Carrick, Donold S, MD  .  tiZANidine (ZANAFLEX) tablet 4 mg, 4 mg, Oral, BID, Carrick, Raphael Gibney, MD  .  traMADol (ULTRAM) tablet 50 mg, 50 mg, Oral, 4 times per day, Vernona Rieger, MD, 50 mg at 06/04/18 1478    PAST MEDICAL HISTORY     Past Medical History:   Diagnosis Date   . Atrial flutter    . CAD (coronary artery disease)    . Carpal tunnel syndrome     H/O   . DM2 (diabetes mellitus, type 2)    . ED (erectile dysfunction)    . Encounter for blood transfusion    . Hypertension    . MI (myocardial infarction) '08, '11   . OA (osteoarthritis)    . Psoriasis        PAST SURGICAL HISTORY       Past Surgical History:   Procedure Laterality Date   . ARTHROPLASTY, HIP, TOTAL, ANTERIOR APPROACH, C ARM Right 05/19/2018    Procedure: ARTHROPLASTY, HIP, TOTAL, ANTERIOR APPROACH, C ARM;  Surgeon: Len Childs, MD;  Location: [facility];  Service: Orthopedics;  Laterality: Right;  RIGHT TOTAL HIP REPLACEMENT, ASI    . ARTHROPLASTY, HIP, TOTAL, REVISION, ANTERIOR APPROACH, C ARM Right 05/29/2018    Procedure: ARTHROPLASTY, HIP, TOTAL, REVISION, ANTERIOR APPROACH, C ARM;  Surgeon: Len Childs, MD;  Location: [facility];  Service: Orthopedics;  Laterality: Right;  RT REVISION PROSTETIC TOTAL HIP    . BICEPS TENDON REPAIR Right    . CARDIOVERSION     . CORONARY ARTERY BYPASS GRAFT  2011   . CYST REMOVAL      RIGHT CHEEK   . INGUINAL HERNIA REPAIR Right     AS CHILD   . KNEE ARTHROTOMY Left 1975   . ORIF, RADIUS, DISTAL (WRIST) Right    . SINUS SURGERY         FAMILY HISTORY     History reviewed. No pertinent family history.    SOCIAL HISTORY / HABITS     Social History     Social History   . Marital status: Divorced     Spouse name: N/A   .  Number of children: N/A   . Years of education: N/A     Occupational History   . Not on file.     Social History Main Topics   . Smoking status: Never Smoker   . Smokeless tobacco: Never Used   . Alcohol use  Yes      Comment: occasional    . Drug use: No   . Sexual activity: Not on file     Other Topics Concern   . Not on file     Social History Narrative   . No narrative on file         FUNCTIONAL STATUS / HOME EQUIPMENTS     Premorbid functional status:      Current functional status:          REVIEW OF SYSTEMS     Constitutional:  Denies fever, chills, poor appetite, poor sleep.  Skin    Denies bruising, pruritus, moles, ulcers, hair or nail changes.   Heme    Denies bleeding, fatigue, lymphadenopathy, anemia.   HEENT  Denies tinnitus, dizziness, deafness, change in vision, epistaxis, sore throat, oral lesions   Respiratory   Denies cough, sputum, night sweats, wheezing.   Cardiovascular  Denies chest pain, dyspnea, palpitations, weakness, murmur   Gastrointestinal  Denies nausea, vomiting, diarrhea, constipation, dysphagia, dyspepsia, melena.  Urologic  Denies dysuria, frequency, urgency, incontinence, retention, nocturia.   Musculoskeletal  Denies neck, back, positive right knee joint pain.   Neurologic  Denies dizziness, tremor, ataxia, paresthesia, seizures, syncope, memory impairment.   Endocrine   Denies heat or cold intolerance, polyuria, polydipsia, polyphagia.   Mood    Denies nervousness or depression          PHYSICAL EXAMINATION     Vitals:    06/04/18 0449   BP: 140/89   Pulse: 89   Resp: 22   Temp: 99.3 F (37.4 C)   SpO2: 93%       GENERAL:   Alert and oriented X 3. NAD  HEENT:  Normocephalic. Atraumatic. Pupil round reactive to light. Extraocular movement intact. Anicteric. Oropharynx w/o exudates or erythema  NECK:   Supple without adenopathy  LUNGS:  Clear to auscultation, no wheezing, no rhonchi  HEART:   Irregular  Rhythm without murmur, gallops, or rubs  ABDOMEN:  Soft, nontender, bowel  sounds present. No organomegaly. No rebound tenderness  EXTREMITIES:  No pain, cyanosis, or edema. Neg Homan's on left.  Significant RLE edema, hip to toes.  Right anterior groin dressing with serous drainage proximally, erythema surrounding staples.  Lower lateral incision c/d/i  NEUROLOGIC:  Alert, oriented, non-focal findings  SKIN:    Intact  MOTOR:   Functional strength throughout left.  RLE limited by pain.  Dorsiflexion/plantarflexion 5/5  SENS:   Intact bilat to PP/LT, proprioception        OBJECTIVE DATE     LABORATORY  Recent Results (from the past 168 hour(s))   CBC with Automated Differential    Collection Time: 05/28/18  9:56 AM   Result Value Ref Range    WBC 8.4 4.0 - 11.0 K/cmm    RBC 2.47 (L) 4.00 - 5.70 M/cmm    Hemoglobin 8.9 (L) 13.0 - 17.5 gm/dL    Hematocrit 24.4 (L) 39.0 - 52.5 %    MCV 108 (H) 80 - 100 fL    MCH 36 (H) 28 - 35 pg    MCHC 33 32 - 36 gm/dL    RDW 01.0 27.2 - 53.6 %    PLT CT 355 130 - 440 K/cmm    MPV 7.5 6.0 - 10.0 fL    NEUTROPHIL % 76.0 42.0 - 78.0 %    Lymphocytes 14.1 (L) 15.0 - 46.0 %    Monocytes 9.3 3.0 - 15.0 %  Eosinophils % 0.3 0.0 - 7.0 %    Basophils % 0.4 0.0 - 3.0 %    Neutrophils Absolute 6.4 1.7 - 8.6 K/cmm    Lymphocytes Absolute 1.2 0.6 - 5.1 K/cmm    Monocytes Absolute 0.8 0.1 - 1.7 K/cmm    Eosinophils Absolute 0.0 0.0 - 0.8 K/cmm    BASO Absolute 0.0 0.0 - 0.3 K/cmm   Basic Metabolic Panel    Collection Time: 05/28/18  9:56 AM   Result Value Ref Range    Sodium 133 (L) 136 - 147 mMol/L    Potassium 4.7 3.5 - 5.3 mMol/L    Chloride 99 98 - 110 mMol/L    CO2 26.7 20.0 - 30.0 mMol/L    Calcium 8.8 8.5 - 10.5 mg/dL    Glucose 644 (H) 71 - 99 mg/dL    Creatinine 0.34 7.42 - 1.30 mg/dL    BUN 25 (H) 7 - 22 mg/dL    Anion Gap 59.5 7.0 - 18.0 mMol/L    BUN/Creatinine Ratio 28.1 10.0 - 30.0 Ratio    EGFR 92 60 - 150 mL/min/1.33m2    Osmolality Calc 276 275 - 300 mOsm/kg   Type and Screen    Collection Time: 05/28/18  9:56 AM   Result Value Ref Range    ABO Rh A  Positive     AB Screen NEGATIVE    Prothrombin time/INR    Collection Time: 05/28/18  9:56 AM   Result Value Ref Range    PT 11.1 9.5 - 11.5 sec    PT INR 1.1 0.5 - 1.3   Dextrose Stick Glucose    Collection Time: 05/28/18 11:16 AM   Result Value Ref Range    Glucose, POCT 218 (H) 71 - 99 mg/dL   Dextrose Stick Glucose    Collection Time: 05/28/18  5:07 PM   Result Value Ref Range    Glucose, POCT 209 (H) 71 - 99 mg/dL   Dextrose Stick Glucose    Collection Time: 05/28/18  9:47 PM   Result Value Ref Range    Glucose, POCT 206 (H) 71 - 99 mg/dL   Dextrose Stick Glucose    Collection Time: 05/29/18  3:45 AM   Result Value Ref Range    Glucose, POCT 189 (H) 71 - 99 mg/dL   I-Stat CG8 Plus    Collection Time: 05/29/18 10:37 AM   Result Value Ref Range    pH, ISTAT 7.33 (L) 7.35 - 7.45    PO2, ISTAT 42 (LL) 75 - 100 mm Hg    BE, ISTAT -9 (L) -2 - 2 mMol/L    HCO3, ISTAT 16.1 (L) 20.0 - 29.0 mMol/L    PCO2, ISTAT 30.5 (L) 35.0 - 45.0 mm Hg    O2 Sat, %, ISTAT 75 (L) 96 - 100 %    i-STAT Allen's Test Pass     i-STAT FIO2 100.00 %    Sample, ISTAT Venous     Site, ISTAT OTHER     i-STAT Sodium 132 (L) 136 - 147 mMol/L    i-STAT Potassium 4.8 3.5 - 5.3 mMol/L    TCO2, ISTAT 17 (L) 24 - 29 mMol/L    Ionized Ca, ISTAT 4.40 4.35 - 5.10 mg/dL    i-STAT Glucose 638 (H) 71 - 99 mg/dL    i-STAT Hematocrit <75.6 (LL) 39.0 - 52.5 %    i-STAT Hemoglobin  13.0 - 17.5 gm/dL     Unable to calculate result due to analyte  out of the analytical measurement range    Operator, ISTAT Operator: [id number] FURCHO AMY    I-Stat CG8 Plus    Collection Time: 05/29/18 10:46 AM   Result Value Ref Range    pH, ISTAT 7.37 7.35 - 7.45    PO2, ISTAT 46 (LL) 75 - 100 mm Hg    BE, ISTAT 0 -2 - 2 mMol/L    HCO3, ISTAT 25.6 20.0 - 29.0 mMol/L    PCO2, ISTAT 44.3 35.0 - 45.0 mm Hg    O2 Sat, %, ISTAT 80 (L) 96 - 100 %    i-STAT Allen's Test Pass     i-STAT FIO2 0.10 %    Sample, ISTAT Venous     Site, ISTAT OTHER     i-STAT Sodium 132 (L) 136 - 147 mMol/L    i-STAT  Potassium 5.4 (H) 3.5 - 5.3 mMol/L    TCO2, ISTAT 27 24 - 29 mMol/L    Ionized Ca, ISTAT 4.50 4.35 - 5.10 mg/dL    i-STAT Glucose 161 (H) 71 - 99 mg/dL    i-STAT Hematocrit 09.6 (L) 39.0 - 52.5 %    i-STAT Hemoglobin 7.5 (L) 13.0 - 17.5 gm/dL    Operator, ISTAT Operator: [id number] PIFER-DODSON KRISTIN    Prepare/Crossmatch Red Blood Cells:  Two Units    Collection Time: 05/29/18 10:55 AM   Result Value Ref Range    01 - Product ID Red Blood Cells     01 - Unit Number [id number]     01 -   Cross Match Compatible     01 -   Status Info Transfused     01 - Product Code E0336V00     01 -   Blood Type A Pos     01 -   Issue Date/Time [id number]     02 - Product ID Red Blood Cells     02 - Unit Number [id number]     02 -   Cross Match Compatible     02 -   Status Info Transfused     02 - Product Code E0336V00     02 -   Blood Type A Pos     02 -   Issue Date/Time [id number]    Dextrose Stick Glucose    Collection Time: 05/29/18 12:03 PM   Result Value Ref Range    Glucose, POCT 250 (H) 71 - 99 mg/dL   Dextrose Stick Glucose    Collection Time: 05/29/18  4:02 PM   Result Value Ref Range    Glucose, POCT 258 (H) 71 - 99 mg/dL   Dextrose Stick Glucose    Collection Time: 05/29/18  6:41 PM   Result Value Ref Range    Glucose, POCT 291 (H) 71 - 99 mg/dL   Dextrose Stick Glucose    Collection Time: 05/29/18  9:01 PM   Result Value Ref Range    Glucose, POCT 285 (H) 71 - 99 mg/dL   Dextrose Stick Glucose    Collection Time: 05/30/18  3:11 AM   Result Value Ref Range    Glucose, POCT 129 (H) 71 - 99 mg/dL   CBC (qam x3)    Collection Time: 05/30/18  4:48 AM   Result Value Ref Range    WBC 10.3 4.0 - 11.0 K/cmm    RBC 2.14 (L) 4.00 - 5.70 M/cmm    Hemoglobin 7.4 (L) 13.0 - 17.5 gm/dL    Hematocrit 34.7 (L) 39.0 - 52.5 %  MCV 103 (H) 80 - 100 fL    MCH 34 28 - 35 pg    MCHC 34 32 - 36 gm/dL    RDW 16.1 (H) 09.6 - 14.0 %    PLT CT 307 130 - 440 K/cmm    MPV 7.2 6.0 - 10.0 fL   Basic metabolic panel    Collection Time:  05/30/18  4:48 AM   Result Value Ref Range    Sodium 132 (L) 136 - 147 mMol/L    Potassium 4.9 3.5 - 5.3 mMol/L    Chloride 101 98 - 110 mMol/L    CO2 26.5 20.0 - 30.0 mMol/L    Calcium 7.9 (L) 8.5 - 10.5 mg/dL    Glucose 045 (H) 71 - 99 mg/dL    Creatinine 4.09 8.11 - 1.30 mg/dL    BUN 22 7 - 22 mg/dL    Anion Gap 9.4 7.0 - 18.0 mMol/L    BUN/Creatinine Ratio 25.0 10.0 - 30.0 Ratio    EGFR 93 60 - 150 mL/min/1.62m2    Osmolality Calc 269 (L) 275 - 300 mOsm/kg   Prepare/Crossmatch Red Blood Cells:  One Unit    Collection Time: 05/30/18  7:42 AM   Result Value Ref Range    01 - Product ID Red Blood Cells     01 - Unit Number [id number]     01 -   Cross Match Compatible     01 -   Status Info Transfused     01 - Product Code E0336V00     01 -   Blood Type A Pos     01 -   Issue Date/Time [id number]    Prepare/Crossmatch Red Blood Cells:  One Unit    Collection Time: 05/30/18  7:43 AM   Result Value Ref Range    01 - Product ID Red Blood Cells     01 - Unit Number [id number]     01 -   Cross Match Compatible     01 -   Status Info Transfused     01 - Product Code E0685V00     01 -   Blood Type A Neg     01 -   Issue Date/Time [id number]    Dextrose Stick Glucose    Collection Time: 05/30/18  8:24 AM   Result Value Ref Range    Glucose, POCT 174 (H) 71 - 99 mg/dL   Dextrose Stick Glucose    Collection Time: 05/30/18 12:16 PM   Result Value Ref Range    Glucose, POCT 241 (H) 71 - 99 mg/dL   Dextrose Stick Glucose    Collection Time: 05/30/18  4:42 PM   Result Value Ref Range    Glucose, POCT 196 (H) 71 - 99 mg/dL   Dextrose Stick Glucose    Collection Time: 05/30/18  9:04 PM   Result Value Ref Range    Glucose, POCT 166 (H) 71 - 99 mg/dL   Dextrose Stick Glucose    Collection Time: 05/31/18  3:22 AM   Result Value Ref Range    Glucose, POCT 138 (H) 71 - 99 mg/dL   CBC (qam x3)    Collection Time: 05/31/18  5:57 AM   Result Value Ref Range    WBC 10.1 4.0 - 11.0 K/cmm    RBC 2.48 (L) 4.00 - 5.70 M/cmm     Hemoglobin 8.3 (L) 13.0 - 17.5 gm/dL    Hematocrit 25.9 (L) 39.0 - 52.5 %  MCV 101 (H) 80 - 100 fL    MCH 34 28 - 35 pg    MCHC 33 32 - 36 gm/dL    RDW 66.4 (H) 40.3 - 14.0 %    PLT CT 301 130 - 440 K/cmm    MPV 7.3 6.0 - 10.0 fL   Basic metabolic panel    Collection Time: 05/31/18  5:57 AM   Result Value Ref Range    Sodium 130 (L) 136 - 147 mMol/L    Potassium 4.3 3.5 - 5.3 mMol/L    Chloride 101 98 - 110 mMol/L    CO2 22.4 20.0 - 30.0 mMol/L    Calcium 8.3 (L) 8.5 - 10.5 mg/dL    Glucose 474 (H) 71 - 99 mg/dL    Creatinine 2.59 (L) 0.80 - 1.30 mg/dL    BUN 15 7 - 22 mg/dL    Anion Gap 56.3 7.0 - 18.0 mMol/L    BUN/Creatinine Ratio 21.1 10.0 - 30.0 Ratio    EGFR 101 60 - 150 mL/min/1.80m2    Osmolality Calc 263 (L) 275 - 300 mOsm/kg   Dextrose Stick Glucose    Collection Time: 05/31/18  7:46 AM   Result Value Ref Range    Glucose, POCT 161 (H) 71 - 99 mg/dL   Dextrose Stick Glucose    Collection Time: 05/31/18 11:27 AM   Result Value Ref Range    Glucose, POCT 221 (H) 71 - 99 mg/dL   Osmolality, Urine    Collection Time: 05/31/18  3:10 PM   Result Value Ref Range    Osmolality, UR 771 392 - 1,090 mOsm/kg   Sodium, Random Urine    Collection Time: 05/31/18  3:10 PM   Result Value Ref Range    Sodium, UR 44 mMol/L   Creatinine, Random Urine    Collection Time: 05/31/18  3:10 PM   Result Value Ref Range    Creatinine, UR 131.82 mg/dL   Dextrose Stick Glucose    Collection Time: 05/31/18  4:31 PM   Result Value Ref Range    Glucose, POCT 179 (H) 71 - 99 mg/dL   Dextrose Stick Glucose    Collection Time: 05/31/18  8:49 PM   Result Value Ref Range    Glucose, POCT 153 (H) 71 - 99 mg/dL   Dextrose Stick Glucose    Collection Time: 06/01/18  3:02 AM   Result Value Ref Range    Glucose, POCT 159 (H) 71 - 99 mg/dL   CBC (qam x3)    Collection Time: 06/01/18  6:56 AM   Result Value Ref Range    WBC 9.6 4.0 - 11.0 K/cmm    RBC 2.57 (L) 4.00 - 5.70 M/cmm    Hemoglobin 8.8 (L) 13.0 - 17.5 gm/dL    Hematocrit 87.5 (L) 39.0 -  52.5 %    MCV 101 (H) 80 - 100 fL    MCH 34 28 - 35 pg    MCHC 34 32 - 36 gm/dL    RDW 64.3 (H) 32.9 - 14.0 %    PLT CT 351 130 - 440 K/cmm    MPV 7.2 6.0 - 10.0 fL   Basic metabolic panel    Collection Time: 06/01/18  6:56 AM   Result Value Ref Range    Sodium 133 (L) 136 - 147 mMol/L    Potassium 4.7 3.5 - 5.3 mMol/L    Chloride 101 98 - 110 mMol/L    CO2 24 20 - 30  mMol/L    Calcium 8.8 8.5 - 10.5 mg/dL    Glucose 846 (H) 71 - 99 mg/dL    Creatinine 9.62 (L) 0.80 - 1.30 mg/dL    BUN 18 7 - 22 mg/dL    Anion Gap 95.2 7.0 - 18.0 mMol/L    BUN/Creatinine Ratio 26.5 10.0 - 30.0 Ratio    EGFR 103 60 - 150 mL/min/1.33m2    Osmolality Calc 272 (L) 275 - 300 mOsm/kg   Dextrose Stick Glucose    Collection Time: 06/01/18  7:13 AM   Result Value Ref Range    Glucose, POCT 174 (H) 71 - 99 mg/dL   Dextrose Stick Glucose    Collection Time: 06/01/18 11:31 AM   Result Value Ref Range    Glucose, POCT 169 (H) 71 - 99 mg/dL   Dextrose Stick Glucose    Collection Time: 06/01/18  4:36 PM   Result Value Ref Range    Glucose, POCT 140 (H) 71 - 99 mg/dL   Dextrose Stick Glucose    Collection Time: 06/01/18  8:59 PM   Result Value Ref Range    Glucose, POCT 137 (H) 71 - 99 mg/dL   B-type Natriuretic Peptide    Collection Time: 06/02/18  5:39 AM   Result Value Ref Range    B-Natriuretic Peptide 231.0 (H) 0.0 - 100.0 pg/mL   Dextrose Stick Glucose    Collection Time: 06/02/18  7:22 AM   Result Value Ref Range    Glucose, POCT 187 (H) 71 - 99 mg/dL   Basic Metabolic Panel    Collection Time: 06/02/18 10:32 AM   Result Value Ref Range    Sodium 135 (L) 136 - 147 mMol/L    Potassium 4.7 3.5 - 5.3 mMol/L    Chloride 102 98 - 110 mMol/L    CO2 24.1 20.0 - 30.0 mMol/L    Calcium 9.2 8.5 - 10.5 mg/dL    Glucose 841 (H) 71 - 99 mg/dL    Creatinine 3.24 4.01 - 1.30 mg/dL    BUN 17 7 - 22 mg/dL    Anion Gap 02.7 7.0 - 18.0 mMol/L    BUN/Creatinine Ratio 20.7 10.0 - 30.0 Ratio    EGFR 95 60 - 150 mL/min/1.52m2    Osmolality Calc 276 275 - 300  mOsm/kg   CBC and differential    Collection Time: 06/02/18 10:32 AM   Result Value Ref Range    WBC 11.9 (H) 4.0 - 11.0 K/cmm    RBC 2.82 (L) 4.00 - 5.70 M/cmm    Hemoglobin 9.8 (L) 13.0 - 17.5 gm/dL    Hematocrit 25.3 (L) 39.0 - 52.5 %    MCV 102 (H) 80 - 100 fL    MCH 35 28 - 35 pg    MCHC 34 32 - 36 gm/dL    RDW 66.4 (H) 40.3 - 14.0 %    PLT CT 469 (H) 130 - 440 K/cmm    MPV 7.3 6.0 - 10.0 fL    NEUTROPHIL % 77.0 42.0 - 78.0 %    Lymphocytes 14.0 (L) 15.0 - 46.0 %    Monocytes 7.0 3.0 - 15.0 %    Eosinophils % 2.0 0.0 - 7.0 %    Basophils % 0.0 0.0 - 3.0 %    Neutrophils Absolute 9.2 (H) 1.7 - 8.6 K/cmm    Lymphocytes Absolute 1.7 0.6 - 5.1 K/cmm    Monocytes Absolute 0.8 0.1 - 1.7 K/cmm    Eosinophils Absolute 0.2  0.0 - 0.8 K/cmm    BASO Absolute 0.0 0.0 - 0.3 K/cmm    RBC Morphology RBC Morphology Reviewed     Macrocytic 1+     Anisocytosis 1+     Polychromasia 1+    Dextrose Stick Glucose    Collection Time: 06/02/18 12:25 PM   Result Value Ref Range    Glucose, POCT 153 (H) 71 - 99 mg/dL   Dextrose Stick Glucose    Collection Time: 06/02/18  4:23 PM   Result Value Ref Range    Glucose, POCT 171 (H) 71 - 99 mg/dL   Dextrose Stick Glucose    Collection Time: 06/02/18  9:02 PM   Result Value Ref Range    Glucose, POCT 151 (H) 71 - 99 mg/dL   Dextrose Stick Glucose    Collection Time: 06/03/18  3:27 AM   Result Value Ref Range    Glucose, POCT 169 (H) 71 - 99 mg/dL   CBC with Automated Differential    Collection Time: 06/03/18  5:38 AM   Result Value Ref Range    WBC 9.8 4.0 - 11.0 K/cmm    RBC 2.61 (L) 4.00 - 5.70 M/cmm    Hemoglobin 8.9 (L) 13.0 - 17.5 gm/dL    Hematocrit 16.1 (L) 39.0 - 52.5 %    MCV 102 (H) 80 - 100 fL    MCH 34 28 - 35 pg    MCHC 33 32 - 36 gm/dL    RDW 09.6 (H) 04.5 - 14.0 %    PLT CT 419 130 - 440 K/cmm    MPV 7.4 6.0 - 10.0 fL    NEUTROPHIL % 74.0 42.0 - 78.0 %    Lymphocytes 14.0 (L) 15.0 - 46.0 %    Monocytes 3.0 3.0 - 15.0 %    Eosinophils % 0.0 0.0 - 7.0 %    Basophils % 0.0 0.0 -  3.0 %    Bands 7 0 - 10 %    Metamyelocytes 1 (H) 0 - 0 %    Myelocytes 1 (H) 0 - 0 %    Neutrophils Absolute 8.1 1.7 - 8.6 K/cmm    Lymphocytes Absolute 1.4 0.6 - 5.1 K/cmm    Monocytes Absolute 0.3 0.1 - 1.7 K/cmm    Eosinophils Absolute 0.0 0.0 - 0.8 K/cmm    BASO Absolute 0.0 0.0 - 0.3 K/cmm    RBC Morphology Morphology Consistent with Hemogram     Macrocytic 1+     Anisocytosis 1+     Polychromasia 1+     Hypochromia 1+     Elliptocytes 1+     Poikilocytosis 1+     Target Cells 1+    Dextrose Stick Glucose    Collection Time: 06/03/18  7:15 AM   Result Value Ref Range    Glucose, POCT 207 (H) 71 - 99 mg/dL   Dextrose Stick Glucose    Collection Time: 06/03/18 12:28 PM   Result Value Ref Range    Glucose, POCT 157 (H) 71 - 99 mg/dL   Dextrose Stick Glucose    Collection Time: 06/03/18  4:29 PM   Result Value Ref Range    Glucose, POCT 224 (H) 71 - 99 mg/dL   Dextrose Stick Glucose    Collection Time: 06/03/18  8:37 PM   Result Value Ref Range    Glucose, POCT 166 (H) 71 - 99 mg/dL   Dextrose Stick Glucose    Collection Time: 06/04/18  3:16 AM  Result Value Ref Range    Glucose, POCT 153 (H) 71 - 99 mg/dL       RADIOLOGY  No orders to display         MEDICAL ASSESSMENT AND PLAN     SUMMARY STATEMENT:  The patient states that he came to [facility] for an elective right total hip replacement.  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  He was recovering well from the hip replacement when he had a fall landing on the right knee and suffered a periprosthetic hip fracture.  Patient underwent surgical repair and is currently nonweightbearing.  He does state that knee pain and generalized pain are somewhat limiting.  Last bowel movement was yesterday.  Post op required 4 Units PRBC and developed hyponatremia.        COMORBIDITIES:  Past Medical History:   Diagnosis Date   . Atrial flutter    . CAD (coronary  artery disease)    . Carpal tunnel syndrome     H/O   . DM2 (diabetes mellitus, type 2)    . ED (erectile dysfunction)    . Encounter for blood transfusion    . Hypertension    . MI (myocardial infarction) '08, '11   . OA (osteoarthritis)    . Psoriasis    1.  S/p right periprosthetic hip fracture after THR.  NWB.   -f/u Dr. Emeline Darling 7/16 at 2pm     gait / Impaired mobility, ADLs / Fall risks / Debility - OT /PT, Fall precautions      2.  DVT risks - eliquis  3.  Pain - oxydocone  4.   PVRs, bowel program  5.  DM-per IM  6.  Afib/flutter - on eliquis  7.  HTN/CAD-coreg, norvasc  8.  SIADH - improving, off fluid restrictions  9.  Post op anemia - monitor  10.  Wound care      REHABILITATION PLAN     Interdisciplinary goals: Ambulation, Transfers, WC mobility, ADLS, Cognitive, Bowel, Bladder, Pain, Safety, Nutrition/diet.    Physical Therapy: Generalized strengthening, Gait, Mobility, Balance, Positioning, AP Range of Motion, Functional mobility, Precautions    Occupational Therapy: Generalized strengthening, Coordination, Fine motor control, AP Range of motion, Balance, Positioning, Precautions, ADL retraining, Cog screen     Rehabilitative Nursing: Self-care, Bowel and Bladder management, Medication teaching,     Social Service: [employer] education, discharge planning.    Medical management: per Internal Medicine / Hospitalist group    Plan of Care: will be formulated with therapy team       Weekly team conference: will be attended.      Patient and family training: will be initiated.     Family Conference: will be coordinated with patient, family, and team as per indication    ANTICIPATED FUNCTIONAL OUTCOMES     Short-term goals:   3 Moderate Assistance  with bathing, dressing, toilet transfer, Supine<->Sit, Sit<->Stand, Bed<->Chair; Ambulation distance 10-69ft with RW / FWW, and Wheelchair Mobility    Intermediate goals:  4 Minimal Assistance  with bathing, dressing, toilet transfer, Supine<->Sit,  Sit<->Stand, Bed<->Chair; Ambulation distance 25-47ft with RW / FWW, and Wheelchair Mobility    Long-term goals:  6 Modified Independent  with bathing, dressing, toilet transfer, Supine<->Sit, Sit<->Stand, Bed<->Chair; Ambulation distance 50-62ft with RW / FWW, and Wheelchair Mobility      BARRIERS TO REHABILITATION:  BARRIERS TO REHABILITATIVE GOALS  No significant barriers to rehabilitation   BARRIERS TO DISCHARGE  No significant barrier to discharge    EXPECTED LENGTH OF STAY:  The patient will be able to participate in 3 hours of skilled therapy about 5 to 7 days per week for a total of 15 hours over a 7-day period starting from the day of admission for an estimated length of stay of 7-10 days .    DISCHARGE DESTINATION: From preadmission evaluation, it is anticipated that the patient will be discharged to home.    POST ADMISSION PHYSICIAN ASSESSMENT AND ATTESTATION       There has been no significant change in patient's functional or medical condition within the 48 hours of the pre- admission screening.    Based on clinical data obtained, this patient appears to require, is willing to participate in, and demonstrate reasonable potentials for functional improvement from intensive and comprehensive inpatient rehabilitation under the direction of a  Physiatrist, as well as from medical management under the supervision of the Internal Medicine Hospitalist team.     Patient will be assessed by Physical Therapy, Occupational Therapy, (and/or) Speech language pathologist, as well as Rehabilitative Nursing.     A less intensive setting would not adequately meet this patient's medical or rehabilitation requirements due to the lack of specialized clinical input.    Rehabilitative prognosis:  good         Signed by:   Lambert Mody, MD  Physical Medicine and Rehabilitation

## 2018-06-04 NOTE — Plan of Care (Signed)
Problem: Occupational Therapy  Goal: By discharge, patient will perform self-care at the patient's highest functional potential.  See OT evaluation/note for goals.  Outcome: Progressing

## 2018-06-04 NOTE — Progress Notes (Signed)
Reviewed the following with pt/caregiver  ROI policies  Role of case manager  Caregiver Plan  Team conference    Mr. Atallah presents as alert and oriented with a fair understanding of his rehab course of tx.  Pt lives alone in an apt in [place] but was staying in [place] with a friend (Dr. Silvestre Moment) when he fell. Pt was independent in all aspects of daily living, socially active, driving, and working full time.  Mr. Hynes is undecided as to where he will be staying post rehab.  Pt would like to go back to [place] to his home with support from his brother and sister.  SW explained that if at all possible he would need to stay near his surgeon in [place] secondary to NWB status.  Pt feels he may be able to stay with Dr. Silvestre Moment in an apartment in basement of home with no steps to enter.  Pt is worried about losing his job if he misses too much work.  Pt is planning on speaking with his boss to see if he can work from home. Pt is planning on speaking to Dr. Silvestre Moment about [place] planning.  Pt does not have a w/c. Pt is aware of insurance authorization until 06/10/18 in which we will submit for an extension if necessary.  Pt has a pleasant and cooperative manner and is motivated to participate in therapies to improve functional level.  Pt's PCP is in [place] so will check with [facility] to follow for home health services while in the local area.  Pt is in agreement.  Pt has no other issues or concerns at this time.

## 2018-06-04 NOTE — Patient Care Conference (Signed)
PHYSICAL MEDICINE AND REHABILITATION   INTERDISCIPLINARY   TEAM CONFERENCE NOTE    Patient Name:  [name], [name]        MRN:   [id number]    Room:   3307/3307-A  Department:    REHABILITATION UNIT     Date of Team Conference: 06/05/2018    Time of Team Conference: 09:50      TEAM MEMBERS PRESENT     Case Manager / Social Work: Marlowe Aschoff, SW  Nursing: Francie Massing, RN   Nursing: Edwin Dada, RN   Physiatrist: Yevette Edwards, MD  Physical Therapy: Luellen Pucker, PT  Occupational Therapy: Rosendo Gros, OT    MEDICAL     Extensive discussion re: patient's ongoing medical management issues, as well as medical and rehabilitative functional progress, plan of care, and discharge planning.    Active Hospital Problems    Diagnosis   . Physical debility     None    Current Facility-Administered Medications   Medication Dose Route Frequency   . acetaminophen  650 mg Oral Q4H   . amLODIPine  10 mg Oral Daily   . apixaban  5 mg Oral Q12H   . bisacodyl  5 mg Oral Daily   . carvedilol  6.25 mg Oral BID Meals   . gabapentin  100 mg Oral Q8H SCH   . glipiZIDE  10 mg Oral BID AC   . insulin lispro  2-16 Units Subcutaneous TID AC    And   . insulin lispro  1-9 Units Subcutaneous QHS   . losartan  50 mg Oral Daily   . metFORMIN  500 mg Oral BID Meals   . polyethylene glycol  17 g Oral BID   . SITagliptin  50 mg Oral BID   . tiZANidine  4 mg Oral BID   . traMADol  50 mg Oral 4 times per day     acetaminophen, diphenhydrAMINE, diphenhydrAMINE, NSG Communication: Glucose POCT order (AC, HS) **AND** insulin lispro **AND** insulin lispro **AND** insulin lispro, naloxone, ondansetron **OR** ondansetron, ondansetron, oxyCODONE, oxyCODONE, promethazine **OR** promethazine **OR** promethazine    NURSING     Pain: Pain controlled with medication  DVT prophylaxis: Eliquis   BLADDER FUNCTION  Bladder management:5 Supervision   BOWEL FUNCTION  Bowel management:6 Modified Independent   SKIN: Staples in place to hip revision and staples removed from  groin incision   DIABETES MELLITUS Patient has established history of diabetes Diabetic management ongoing - refer to medical records for details    PHYSICAL THERAPY     Bed/Chair/Wheelchair Transfer: 3 Moderate Assistance     Wheelchair Mobility: 6 Modified Independent   Distance: 150 feet at Modified Independent  level    Ambulation: 1 Total Assistance / Dependence   Assistive Device: FWW  Distance: 10 feet at Total Assistance level    Stairs: Not assessed / Inability to participate  Number of Steps: 0 at Total Assistance level  Assistive Device: n/a    OCCUPATIONAL THERAPY     Eating: 7 Independent   Grooming: 7 Independent   Bathing: 2 Maximal Assist  Dressing Upper Body: 5 Supervision   Dressing Lower Body: 1 Total Assist  Toileting: 1 Total Assist  Toilet Transfer: 2 Maximal Assist  Tub/Shower Transfer: Not assessed / Inability to participate    Comprehension: 6 Modified Independent   Expression: 7 Independent   Problem Solving: 7 Independent   Memory: 7 Independent     OT Recommendations: None currently    SPEECH LANGUAGE PATHOLOGY  Consultation not clinically indicated    NEUROPSYCHOLOGY     NEUROPSYCHOLOGY  Consult not clinically indicated    RESPIRATORY THERAPY     RESPIRATORY CONSULTATION  Not initiated / not clinically indicated    REHABILITATION GOALS     Occupational Therapy Goal: Long Term Goals: By d/c pt to be at mod I level with self care, functional transfers, light home management with AE AT DME prn. [ Progressing towards goal, goal still valid ]    Physical Therapy Goal:   [ Progressing towards goal, goal still valid ]    Nursing Goal: Patient will manage home medications and wound care  [ Progressing towards goal, goal still valid ]    PROGRESS TOWARDS GOALS     Is the patient making progress towards goals? Yes    VALIDITY OF TREATMENT PLAN     Treatment plan still valid? Yes    BARRIERS TO REHABILITATION     BARRIERS TO REHABILITATIVE GOALS  Fall risks with high potentials for  recurrences  Weight bearing status restrictions  Cardiovascular risks / Cardiopulmonary risks / Vascular risks  Complex medical management   Deficits such as visual / hearing impairments / limb or joint deficiencies affecting rehabilitaiton  BARRIERS TO DISCHARGE  Unavailability of home and family support  Weight bearing status restrictions  Home accessibility / Home safety  Complex medical management     CASE MANAGEMENT / SOCIAL SERVICE / DISCHARGE PLAN     Anticipated date of discharge:  To be set     DISCHARGE DISPOSITION     Home with Home Health Nursing, with Home Health Occupational Therapy and with Home Health Physical Therapy    EQUIPMENT RECOMMENDED     Occupational Therapy: To be determined pending patient progress    Physical Therapy: To be determined pending patient progress    REHABILITATION FOLLOW-UP INSTRUCTIONS     Physical Medicine & Rehabilitation service outpatient follow-up as per clinical indication, to be determined at time of discharge  Primary Care Physician  Orthopedist   Prescriptions for medications and follow-up testing as per Hospitalist Team    RATIONAL FOR NEED OF FURTHER INTENSIVE REHAB     Patient continues to express a willingness to participate and demonstrates reasonable potential for functional improvement from intensive and comprehensive inpatient Rehabilitation under the direction of Physical Medicine and Rehab as well as medical management under the supervision of Internal Medicine hospitalist team. Continue acute rehabilitation.     Physician's electronic signature attests the presence of above team members during medical and functional discussions during this weekly team conference meeting as stated above.    10 min spent in the coordination of care for this patient with the medical and rehab team

## 2018-06-04 NOTE — Plan of Care (Addendum)
NURSE NOTE SUMMARY     Patient Name: [name]   Attending Physician: Vernona Rieger, MD   Primary Care Physician: Md, Out Of Network   Date of Admission:   06/03/2018   Today's date:   06/04/2018 LOS: 1 days   Shift Summary:                                                              Assumed care of patient at 0700.  Patient observed to be resting in bed, patient complaint of mild pain, requesting pain medication to be administered when available.   0830 Patient voiced complaint of pain to RLE 10/10, prn oxy administered at this time.  Patient received therapy evaluation.  Patient assisted back to bed after therapy.  0930 Upon reassessment patient continues to complain of pain 8/10.  Patient requested additional medication for pain.  RN explained to patient the way in which pain medication was ordered.  Patient verbalized understanding of physician orders.  Patient offered other comfort measures, patient declined at this time.  Will continue to monitor.  1200 Patient continues to complain of pain 8/10 RLE.  Patient medicated at this time with scheduled tylenol and tramadol.  1250 Patient continues with complaint of pain 8/10 to RLE after scheduled medications. PRN pain medication administered at this time per patient request.  1350 Patient voiced pain under control at this time 5/10 RLE.  1700 Patient states pain still under control at this time.  Voiced 5/10 pain to RLE.  Patient medicated with scheduled pain medication at this time.    Patient with uneventful shift after pain controlled.  Patient participated in all scheduled therapy.  Ate all meals while up in w/c in dining room.     Provider Notifications:      Rapid Response Notifications:  Mobility:        PMP Activity: Step 6 - Walks in Room (06/03/2018 12:00 PM)     Weight tracking:  Family Dynamic:     Last 3 Weights for the past 72 hrs (Last 3 readings):   Weight   06/03/18 1929 141 kg (310 lb 13.6 oz)       Recent Vitals:  Active Problems:     BP  (!) 167/95   Pulse (!) 112   Temp 99.3 F (37.4 C) (Oral)   Resp 22   Ht 1.93 m (6\' 4" )   Wt 141 kg (310 lb 13.6 oz)   SpO2 93%   BMI 37.84 kg/m          Active Problems:    Physical debility                   Problem: Pain interferes with ability to perform ADL  Goal: Pain at adequate level as identified by patient  Outcome: Progressing   06/03/18 2323   Goal/Interventions addressed this shift   Pain at adequate level as identified by patient Assess pain on admission, during daily assessment and/or before any "as needed" intervention(s);Reassess pain within 30-60 minutes of any procedure/intervention, per Pain Assessment, Intervention, Reassessment (AIR) Cycle;Evaluate if patient comfort function goal is met;Include patient/patient care companion in decisions related to pain management as needed;Consult/collaborate with Physical Therapy, Occupational Therapy, and/or Speech Therapy;Offer non-pharmacological pain management interventions;Evaluate patient's satisfaction  with pain management progress

## 2018-06-04 NOTE — Plan of Care (Addendum)
Problem: Pain interferes with ability to perform ADL  Goal: Pain at adequate level as identified by patient  Outcome: Progressing   06/03/18 2323   Goal/Interventions addressed this shift   Pain at adequate level as identified by patient Assess pain on admission, during daily assessment and/or before any "as needed" intervention(s);Reassess pain within 30-60 minutes of any procedure/intervention, per Pain Assessment, Intervention, Reassessment (AIR) Cycle;Evaluate if patient comfort function goal is met;Include patient/patient care companion in decisions related to pain management as needed;Consult/collaborate with Physical Therapy, Occupational Therapy, and/or Speech Therapy;Offer non-pharmacological pain management interventions;Evaluate patient's satisfaction with pain management progress     NURSE NOTE SUMMARY     Patient Name: [name]   Attending Physician: Vernona Rieger, MD   Primary Care Physician: Md, Out Of Network   Date of Admission:   06/03/2018   Today's date:   06/04/2018 LOS: 1 days   Shift Summary:                                                              Patient complains of pain to his right leg and occasionally his left leg. Scheduled medication and PRN given. Top dressing soiled, cleansed and changed. Anterior incision appears red and moist with serosanguenous drainage. FS at HS 192. 1 unit of SSI given. Call bell with in reach and bed alarm set.   Provider Notifications:      Rapid Response Notifications:  Mobility:        PMP Activity: Step 6 - Walks in Room (06/03/2018 12:00 PM)     Weight tracking:  Family Dynamic:     Last 3 Weights for the past 72 hrs (Last 3 readings):   Weight   06/03/18 1929 141 kg (310 lb 13.6 oz)       Recent Vitals:  Active Problems:     BP 143/71   Pulse 88   Temp 100 F (37.8 C) (Oral)   Resp 20   Ht 1.93 m (6\' 4" )   Wt 141 kg (310 lb 13.6 oz)   SpO2 96%   BMI 37.84 kg/m          Active Problems:    Physical debility

## 2018-06-05 DIAGNOSIS — S728X1S Other fracture of right femur, sequela: Secondary | ICD-10-CM

## 2018-06-05 LAB — VH DEXTROSE STICK GLUCOSE
Glucose POCT: 137 mg/dL — ABNORMAL HIGH (ref 71–99)
Glucose POCT: 160 mg/dL — ABNORMAL HIGH (ref 71–99)
Glucose POCT: 113 mg/dL — ABNORMAL HIGH (ref 71–99)
Glucose POCT: 154 mg/dL — ABNORMAL HIGH (ref 71–99)
Glucose POCT: 169 mg/dL — ABNORMAL HIGH (ref 71–99)

## 2018-06-05 MED ORDER — CARVEDILOL 6.25 MG PO TABS
12.50 mg | ORAL_TABLET | Freq: Two times a day (BID) | ORAL | Status: DC
Start: 2018-06-05 — End: 2018-06-05

## 2018-06-05 MED ORDER — CARVEDILOL 6.25 MG PO TABS
6.25 mg | ORAL_TABLET | Freq: Two times a day (BID) | ORAL | Status: DC
Start: 2018-06-05 — End: 2018-06-09
  Administered 2018-06-05 – 2018-06-09 (×8): 6.25 mg via ORAL
  Filled 2018-06-05 (×8): qty 1

## 2018-06-05 NOTE — Plan of Care (Addendum)
Problem: Moderate/High Fall Risk Score >5  Goal: Patient will remain free of falls   06/05/18 1012   Moderate Risk Falls Interventions (6-13)   VH Moderate Risk (6-13) ALL REQUIRED LOW INTERVENTIONS;INITIATE YELLOW "FALL RISK" SIGNAGE;YELLOW NON-SKID SLIPPERS   OTHER   Moderate Risk (6-13) LOW-Fall Interventions Appropriate for Low Fall Risk;MOD-Initiate Yellow "Fall Risk" magnet communication tool     NURSE NOTE SUMMARY     Patient Name: [name],[name] MARK   Attending Physician: Vernona Rieger, MD   Primary Care Physician: Md, Out Of Network   Date of Admission:   06/03/2018   Today's date:   06/05/2018 LOS: 2 days   Shift Summary:                                                              Assumed care of pt at 0700  Morning meds given at 0749. Medicated with prn oxycodone  Pt ate breakfast  Pt participated with morning therapy  Pt medicated with prn oxycodone 1050  Physical assessment completed and dressings changed to right hip 1156  Pt ate lunch  Scheduled tylenol and tramadol given 1304  Pt participated with afternoon therapy  Pt ate dinner. Prn oxycodone given 1517     Provider Notifications:      Rapid Response Notifications:  Mobility:              Weight tracking:  Family Dynamic:     Last 3 Weights for the past 72 hrs (Last 3 readings):   Weight   06/03/18 1929 141 kg (310 lb 13.6 oz)       Recent Vitals:  Active Problems:     BP 144/74   Pulse (!) 101   Temp 97 F (36.1 C) (Oral)   Resp 18   Ht 1.93 m (6\' 4" )   Wt 141 kg (310 lb 13.6 oz)   SpO2 95%   BMI 37.84 kg/m          Active Problems:    Physical debility

## 2018-06-05 NOTE — Progress Notes (Signed)
Requested Ice bags changed out. Pain level at scale 8. Describes as sharp. ( patient states pain level during PT is 12.)

## 2018-06-05 NOTE — Progress Notes (Signed)
PHYSICAL MEDICINE AND REHABILITATION PROGRESS NOTE    Date: 06/05/18 Time: 9:00 AM  Patient Name: [name], [name]    CC:  On consult for functional and rehabilitative needs; general pain management; and assessment of medical complexities and co-morbidities as barriers to rehabilitation.      SUBJECTIVE     The patient reports that he is doing unusually well, better than he anticipated.  Still with significant pain in the right leg.  He is requiring dressing changes every shift due to serous drainage.    Therapy Current Functional Levels:   Overall ADL Level of Assist: Max Assist  Toilet Transfer Level of Assist: Max Assist  Tub/Shower Level of Assist: Not Attempted  Ambulation Distance: 5    Ambulation Level of Assist: Min Assist  Bed/Chair/Wheelchair Level of Assist: Min Assist          TEAM CONFERENCE:73min spent re: patient coordination of care with medical and rehab teams.   Present: Physiatrist, Case Managers/Social Service, Nursing, OT, PT, SLP, Neuropsychology. Extensive discussion re: medical and rehabilitative progress, plan of care, and discharge planning.   Refer to signed Team Conference notes as well as therapy notes in EHR for details.    TEAM CONFERENCEnote -if pending at the time of this progress note - please refer to EMR.    35 minutes were spent at the bedside involved in medical evaluation/interview/counselling and on the unit with patient care including Team conference 10 minutes with SLP/PT/OT/NSG/SW    Nursing-bladder 5, bowel 6  PT-mod to max assist with transfers, ambulates 10 feet, pain is limiting  OT-max assist bathing, total assist lower body, total assist for toileting due to pain and nonweightbearing status    REVIEW OF SYSTEM     Constitutional:  Denies fever/chills/unexplained weight changes  HEENT:  Denies headache, vision changes  CV:        Denies chest pain/palpitations  Pulm:    Denies shortness of breath  GI:      Denies nausea/vomitting/abd pain; Regular bowel  patterns  GU:    Denies bloody/burning urination/trouble controlling bladder  Neuro:   Denies weakness;  Denies paresthesia  Musculoskeletal:   Denies new falls/injuries  Psych: mood euthymic    MEDICATIONS   SCHEDULED MEDICATIONS  Current Facility-Administered Medications   Medication Dose Route Frequency   . acetaminophen  650 mg Oral Q4H   . amLODIPine  10 mg Oral Daily   . apixaban  5 mg Oral Q12H   . bisacodyl  5 mg Oral Daily   . carvedilol  12.5 mg Oral BID Meals   . gabapentin  100 mg Oral Q8H SCH   . glipiZIDE  10 mg Oral BID AC   . insulin lispro  2-16 Units Subcutaneous TID AC    And   . insulin lispro  1-9 Units Subcutaneous QHS   . losartan  50 mg Oral Daily   . metFORMIN  500 mg Oral BID Meals   . polyethylene glycol  17 g Oral BID   . SITagliptin  50 mg Oral BID   . tiZANidine  4 mg Oral BID   . traMADol  50 mg Oral 4 times per day       AS NEEDED MEDICATIONS  acetaminophen, diphenhydrAMINE, diphenhydrAMINE, NSG Communication: Glucose POCT order (AC, HS) **AND** insulin lispro **AND** insulin lispro **AND** insulin lispro, naloxone, ondansetron **OR** ondansetron, ondansetron, oxyCODONE, oxyCODONE, promethazine **OR** promethazine **OR** promethazine    PHYSICAL EXAMINATION   VITAL SIGNS  Vitals:  06/05/18 0750   BP: 144/74   Pulse: (!) 101   Resp:    Temp:    SpO2:          EXAMINATION  GENERAL:                  Alert and oriented X 3. NAD  LUNGS:                       Clear to auscultation, no wheezing, no rhonchi  HEART:                       Irregular  Rhythm without murmur, gallops, or rubs  ABDOMEN:      Soft, nontender, bowel sounds present. No organomegaly. No rebound tenderness  EXTREMITIES:           No pain, cyanosis, or edema. Neg Homan's on left.  Significant RLE edema, hip to toes.  Right anterior groin dressing with serous drainage proximally (about same amount as yesterday, erythema surrounding staples.  Lower lateral incision c/d/i  NEUROLOGIC:           Alert, oriented, non-focal  findings  SKIN:                           Intact  MOTOR:                      Functional strength throughout left.  RLE limited by pain.  Dorsiflexion/plantarflexion 5/5  SENS:                         Intact bilat to PP/LT, proprioception    OBJECTIVE DATA   Laboratory  Results     Procedure Component Value Units Date/Time    Dextrose Stick Glucose [[id number]]  (Abnormal) Collected:  06/05/18 0705    Specimen:  Blood Updated:  06/05/18 0721     Glucose, POCT 160 (H) mg/dL     Dextrose Stick Glucose [[id number]]  (Abnormal) Collected:  06/05/18 0308    Specimen:  Blood Updated:  06/05/18 0324     Glucose, POCT 137 (H) mg/dL     Dextrose Stick Glucose [[id number]]  (Abnormal) Collected:  06/04/18 2105    Specimen:  Blood Updated:  06/04/18 2121     Glucose, POCT 192 (H) mg/dL     Dextrose Stick Glucose [[id number]]  (Abnormal) Collected:  06/04/18 1633    Specimen:  Blood Updated:  06/04/18 1649     Glucose, POCT 150 (H) mg/dL     Dextrose Stick Glucose [[id number]]  (Abnormal) Collected:  06/04/18 1203    Specimen:  Blood Updated:  06/04/18 1219     Glucose, POCT 220 (H) mg/dL           Micro  Microbiology Results     None            Radiology  No orders to display         ASSESSMENT   SUMMARY STATEMENT:  The patient states that he came to [facility] for an elective right total hip replacement.  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  He was recovering well from the hip replacement when he had a fall landing on the right knee and suffered a periprosthetic hip fracture.  Patient underwent  surgical repair and is currently nonweightbearing.  He does state that knee pain and generalized pain are somewhat limiting.  Last bowel movement was yesterday.  Post op required 4 Units PRBC and developed hyponatremia.      Diagnosis:     Active Hospital Problems    Diagnosis   . Physical debility   1.  S/p right  periprosthetic hip fracture after THR.  NWB.              -f/u Dr. Emeline Darling 7/16 at 2pm                gait / Impaired mobility, ADLs / Fall risks / Debility - OT /PT, Fall precautions      Continue with current rehabilitative and medical management plan as discussed above.   Patient is participating in therapy, working towards goals. Refer to therapy notes in EHR for details of current progress.    Coordination of care:   Rehabilitation goal setting and medical management plan discussed with therapy, nursing, and hospitalist team in AM meetings  Medication list and pertinent lab work reviewed.   Medical management as per hospitalist  Patient to continue acute rehabilitation.     [place] Goal: 7/16 prior to ortho f/u, HH PT/OT/RN    2.  DVT risks - eliquis  3.  Pain - oxydocone  4.   PVRs, bowel program 7/1  5.  DM-per IM  6.  Afib/flutter - on eliquis  7.  HTN/CAD-coreg, norvasc  8.  SIADH - improving, off fluid restrictions  9.  Post op anemia - monitor  10.  Wound care, dressing changes q shift    PLAN   Patient is participating in therapy, working towards goals.   Refer to therapy notes in EHR for details of current progress.  Patient to continue acute rehabilitation  Continue with current medical management plan as per Internal Medicine hospitalist attending team.       Signed by:   Lambert Mody, MD  Physical Medicine and Rehabilitation

## 2018-06-05 NOTE — Progress Notes (Signed)
MEDICAL PROGRESS NOTE  [facility]      Patient: [name]  Date: 06/05/2018   LOS: 2 Days  Admission Date: 06/03/2018   MRN: [id number]  Attending: Ronald Lobo MD     ASSESSMENT/PLAN     Jermar Colter is a 62 y.o. male with PMH significant for CAD, s/p bypass, aflutter on Eliquis,  DM2 on oral meds, htn, OA     Patient was hospitalized at [facility] 6/24-7/2.  Pt presented with right hip pain and right knee pain after falling. 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 Ivar Bury. He was exercising/doing physical therapy and fell. He landed on his right knee and then on his right hip. Xray's revealed  periprosthetic femur fracture.  Pt was seen by Dr Emeline Darling and underwent ORIF.  Pt has post op anemia and required 2 units of prbc.  Pt had mild hyponatremia as well felt likely to be SIADH, he was treated with salt and fluid restriction with improvement.  Pt tolerating oral intake,   had a bm yesterday.  Pt's pain is controlled on tylenol, ultram and oxycodone.  Pt is currently awake, alert, denies sob, no cp, no n/v. Pt has been doing well with PT. Sugars have been under fair control in hospital. Pt states sugars at home much better and recent hga1c was down in the 5 range.       1. Physical debility after fall and periprosthetic right hip fracture s/p ORIF - admit for acute rehab.  Pt/ot/pmr following.  F/u with Dr Emeline Darling on July 16  2. DM2 - cont home regimen. Adjusted insulin as needed.  Sugars currently acceptable.   3. Post op blood loss anemia - s/p 2 units after surgery and had 2 units day of surgery.  Recheck in am.  4. 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  Will recheck in am  off salt and fluid restriction.   5.  Hx of afib/flutter, cad, htn - cont coreg, eliquis, norvasc.  Elevated bp/pulse again this am in setting of pain, and hadn't received his coreg or norvasc yet.  Pt's overall trend last few days have been elevated, d/w pt increasing coreg, he is currently  reluctant to do this as he had low blood pressures in the past at this dose, pt requests waiting a few more days and see how it trends which is very reasonable.   6.  Constipation proph - cont bowel regimen.  Last bm 7/2.           DVT/VTE Prophylaxis:  Eliquis    Follow up:  Dr Emeline Darling July 16    Care Plan discussed with nursing, consultants, case manager when possible    SUBJECTIVE     Pt had uneventful evening except bouts of pain.  Doing well currently.  No sob, no n/v, no cp, tolerating po's,  pain fairly controlled.        Therapy Current Functional Levels:   Overall ADL Level of Assist: Max Assist  Toilet Transfer Level of Assist: Max Assist  Tub/Shower Level of Assist: Not Attempted  Ambulation Distance: 5  feet  Ambulation Level of Assist: Min Assist  Bed/Chair/Wheelchair Level of Assist: Min Assist       MEDICATIONS     Current Facility-Administered Medications   Medication Dose Route Frequency   . acetaminophen  650 mg Oral Q4H   . amLODIPine  10 mg Oral Daily   . apixaban  5 mg Oral Q12H   . bisacodyl  5 mg Oral Daily   . carvedilol  6.25 mg Oral BID Meals   . gabapentin  100 mg Oral Q8H SCH   . glipiZIDE  10 mg Oral BID AC   . insulin lispro  2-16 Units Subcutaneous TID AC    And   . insulin lispro  1-9 Units Subcutaneous QHS   . losartan  50 mg Oral Daily   . metFORMIN  500 mg Oral BID Meals   . polyethylene glycol  17 g Oral BID   . SITagliptin  50 mg Oral BID   . tiZANidine  4 mg Oral BID   . traMADol  50 mg Oral 4 times per day       ROS     Remainder of 10 point ROS as above or otherwise negative    PHYSICAL EXAM     Vitals:    06/05/18 0750   BP: 144/74   Pulse: (!) 101   Resp:    Temp:    SpO2:        Temperature: Temp  Min: 97 F (36.1 C)  Max: 100 F (37.8 C)  Pulse: Pulse  Min: 82  Max: 101  Respiratory: Resp  Min: 18  Max: 20  Non-Invasive BP: BP  Min: 129/68  Max: 144/74  Pulse Oximetry SpO2  Min: 95 %  Max: 98 %    Intake and Output Summary (Last 24 hours) at Date Time    Intake/Output  Summary (Last 24 hours) at 06/05/18 1033  Last data filed at 06/05/18 0800   Gross per 24 hour   Intake              720 ml   Output             2622 ml   Net            -1902 ml      Wt Readings from Last 3 Encounters:   06/03/18 141 kg (310 lb 13.6 oz)   05/26/18 135.5 kg (298 lb 11.6 oz)   05/19/18 136.1 kg (300 lb 0.7 oz)         GEN APPEARANCE: NAD;    HEENT: Conjunctiva Clear; Mucous membranes moist  CVS: RRR, S1, S2; No M/G/R  LUNGS: CTAB; No Wheezes; No Rhonchi: No rales   ABD: Soft; Non-tender, + Normoactive BS   EXT: right leg edema, bruising; Pulses 2+ and intact.  Staples are intact, wound healing.   SKIN: No rash or Lesions  NEURO: No Focal neurological deficits      LABS       Recent Labs  Lab 06/03/18  0538 06/02/18  1032 06/01/18  0656   WBC 9.8 11.9* 9.6   RBC 2.61* 2.82* 2.57*   Hemoglobin 8.9* 9.8* 8.8*   Hematocrit 26.5* 28.7* 26.0*   MCV 102* 102* 101*   PLT CT 419 469* 351         Recent Labs  Lab 06/02/18  1032 06/01/18  0656 05/31/18  0557 05/30/18  0448   Sodium 135* 133* 130* 132*   Potassium 4.7 4.7 4.3 4.9   Chloride 102 101 101 101   CO2 24.1 24 22.4 26.5   BUN 17 18 15 22    Creatinine 0.82 0.68* 0.71* 0.88   Glucose 178* 168* 125* 126*   Calcium 9.2 8.8 8.3* 7.9*         Recent Labs  Lab 05/31/18  1510   Osmolality, UR 771   Sodium, UR 44                         (!) 160 (The above 1 analytes were performed by [facility] [phone]) [address] [address] )    Microbiology, reviewed and significant for:  No results for input(s): MICRO in the last 24 hours.      RADIOLOGY     Radiological Procedure reviewed.    No orders to display       Signed,  Ronald Lobo, MD  10:33 AM 06/05/2018

## 2018-06-05 NOTE — Progress Notes (Signed)
Assisted to BR with FWW. Pt. With some small amt. Of rectal bleeding from hemorrhoids. Peri - pad changed out. Denies pain. Lidocaine patch to posterior neck.

## 2018-06-06 LAB — CBC AND DIFFERENTIAL
Basophils %: 0.4 % (ref 0.0–3.0)
Basophils Absolute: 0 10*3/uL (ref 0.0–0.3)
Eosinophils %: 0.4 % (ref 0.0–7.0)
Eosinophils Absolute: 0 10*3/uL (ref 0.0–0.8)
Hematocrit: 25.7 % — ABNORMAL LOW (ref 39.0–52.5)
Hemoglobin: 8.6 gm/dL — ABNORMAL LOW (ref 13.0–17.5)
Lymphocytes Absolute: 1.1 10*3/uL (ref 0.6–5.1)
Lymphocytes: 10.1 % — ABNORMAL LOW (ref 15.0–46.0)
MCH: 34 pg (ref 28–35)
MCHC: 33 gm/dL (ref 32–36)
MCV: 102 fL — ABNORMAL HIGH (ref 80–100)
MPV: 7.2 fL (ref 6.0–10.0)
Monocytes Absolute: 1 10*3/uL (ref 0.1–1.7)
Monocytes: 9.3 % (ref 3.0–15.0)
Neutrophils %: 79.9 % — ABNORMAL HIGH (ref 42.0–78.0)
Neutrophils Absolute: 8.6 10*3/uL (ref 1.7–8.6)
PLT CT: 436 10*3/uL (ref 130–440)
RBC: 2.52 10*6/uL — ABNORMAL LOW (ref 4.00–5.70)
RDW: 17.3 % — ABNORMAL HIGH (ref 11.0–14.0)
WBC: 10.8 10*3/uL (ref 4.0–11.0)

## 2018-06-06 LAB — BASIC METABOLIC PANEL
Anion Gap: 11.3 mMol/L (ref 7.0–18.0)
BUN / Creatinine Ratio: 21.9 Ratio (ref 10.0–30.0)
BUN: 16 mg/dL (ref 7–22)
CO2: 26 mMol/L (ref 20–30)
Calcium: 8.7 mg/dL (ref 8.5–10.5)
Chloride: 98 mMol/L (ref 98–110)
Creatinine: 0.73 mg/dL — ABNORMAL LOW (ref 0.80–1.30)
EGFR: 100 mL/min/{1.73_m2} (ref 60–150)
Glucose: 146 mg/dL — ABNORMAL HIGH (ref 71–99)
Osmolality Calc: 266 mOsm/kg — ABNORMAL LOW (ref 275–300)
Potassium: 4.3 mMol/L (ref 3.5–5.3)
Sodium: 131 mMol/L — ABNORMAL LOW (ref 136–147)

## 2018-06-06 LAB — VH URINALYSIS WITH MICROSCOPIC AND CULTURE IF INDICATED
Bilirubin, UA: NEGATIVE
Blood, UA: NEGATIVE
Glucose, UA: NEGATIVE mg/dL
Ketones UA: NEGATIVE mg/dL
Leukocyte Esterase, UA: NEGATIVE Leu/uL
Nitrite, UA: NEGATIVE
Protein, UR: NEGATIVE mg/dL
Urine Specific Gravity: 1.009 (ref 1.001–1.040)
Urobilinogen, UA: NORMAL mg/dL
pH, Urine: 5 pH (ref 5.0–8.0)

## 2018-06-06 LAB — VH DEXTROSE STICK GLUCOSE
Glucose POCT: 146 mg/dL — ABNORMAL HIGH (ref 71–99)
Glucose POCT: 180 mg/dL — ABNORMAL HIGH (ref 71–99)
Glucose POCT: 122 mg/dL — ABNORMAL HIGH (ref 71–99)
Glucose POCT: 189 mg/dL — ABNORMAL HIGH (ref 71–99)
Glucose POCT: 259 mg/dL — ABNORMAL HIGH (ref 71–99)

## 2018-06-06 MED ORDER — TRAMADOL HCL 50 MG PO TABS
50.00 mg | ORAL_TABLET | Freq: Four times a day (QID) | ORAL | Status: DC | PRN
Start: 2018-06-06 — End: 2018-06-20
  Administered 2018-06-07 – 2018-06-20 (×12): 50 mg via ORAL
  Filled 2018-06-06 (×12): qty 1

## 2018-06-06 MED ORDER — GABAPENTIN 100 MG PO CAPS
100.00 mg | ORAL_CAPSULE | Freq: Three times a day (TID) | ORAL | Status: DC | PRN
Start: 2018-06-06 — End: 2018-06-20
  Administered 2018-06-06 – 2018-06-20 (×9): 100 mg via ORAL
  Filled 2018-06-06 (×9): qty 1

## 2018-06-06 MED ORDER — CEPHALEXIN 500 MG PO CAPS
500.00 mg | ORAL_CAPSULE | Freq: Four times a day (QID) | ORAL | Status: DC
Start: 2018-06-06 — End: 2018-06-13
  Administered 2018-06-06 – 2018-06-13 (×27): 500 mg via ORAL
  Filled 2018-06-06 (×21): qty 1

## 2018-06-06 MED ORDER — FUROSEMIDE 40 MG PO TABS
40.00 mg | ORAL_TABLET | Freq: Once | ORAL | Status: AC
Start: 2018-06-06 — End: 2018-06-06
  Administered 2018-06-06: 12:00:00 40 mg via ORAL
  Filled 2018-06-06: qty 1

## 2018-06-06 MED ORDER — VH BIO-K PLUS PROBIOTIC 50 BIL CFU CAPSULE
50.00 | DELAYED_RELEASE_CAPSULE | Freq: Every day | ORAL | Status: DC
Start: 2018-06-06 — End: 2018-06-20
  Administered 2018-06-06 – 2018-06-20 (×15): 50 via ORAL
  Filled 2018-06-06 (×15): qty 1

## 2018-06-06 MED ORDER — DIGESTIVE ENZYMES PO CAPS
1.00 | ORAL_CAPSULE | Freq: Three times a day (TID) | ORAL | Status: DC
Start: 2018-06-07 — End: 2018-06-08
  Administered 2018-06-07 – 2018-06-08 (×4): 1 via ORAL

## 2018-06-06 NOTE — Plan of Care (Addendum)
NURSE NOTE SUMMARY     Patient Name: [name]   Attending Physician: Vernona Rieger, MD   Primary Care Physician: Md, Out Of Network   Date of Admission:   06/03/2018   Today's date:   06/06/2018 LOS: 3 days   Shift Summary:                                                              Maintained ice packs to rt leg most of shift. OOB to BR during night. Pt. Needs reminders not to wt. Bear  On rt leg. Medicated through out shift for pain to rt hip / leg.    Provider Notifications:      Rapid Response Notifications:  Mobility:              Weight tracking:  Family Dynamic:     Last 3 Weights for the past 72 hrs (Last 3 readings):   Weight   06/03/18 1929 141 kg (310 lb 13.6 oz)       Recent Vitals:  Active Problems:     BP 149/73   Pulse 83   Temp 98.1 F (36.7 C) (Oral)   Resp 20   Ht 1.93 m (6\' 4" )   Wt 141 kg (310 lb 13.6 oz)   SpO2 93%   BMI 37.84 kg/m          Active Problems:    Physical debility                 Problem: Compromised Tissue integrity  Goal: Damaged tissue is healing and protected  Outcome: Progressing  Assess rt hip surgical incision every shift. Change dressings prn.

## 2018-06-06 NOTE — Plan of Care (Signed)
Problem: Moderate/High Fall Risk Score >5  Goal: Patient will remain free of falls  Outcome: Progressing   06/06/18 1837   Moderate Risk Falls Interventions (6-13)   VH Moderate Risk (6-13) YELLOW NON-SKID SLIPPERS;INITIATE YELLOW "FALL RISK" SIGNAGE;ALL REQUIRED LOW INTERVENTIONS     NURSE NOTE SUMMARY     Patient Name: [name],[name]   Attending Physician: Vernona Rieger, MD   Primary Care Physician: Md, Out Of Network   Date of Admission:   06/03/2018   Today's date:   06/06/2018 LOS: 3 days   Shift Summary:                                                              See [facility] for medication administration throughout day. Pt participated in morning and afternoon therapies. Dressing changed to right anterior groin. Dressing intact to right lateral hip   Provider Notifications:      Rapid Response Notifications:  Mobility:              Weight tracking:  Family Dynamic:     Last 3 Weights for the past 72 hrs (Last 3 readings):   Weight   06/03/18 1929 141 kg (310 lb 13.6 oz)       Recent Vitals:  Active Problems:     BP 139/65   Pulse (!) 103   Temp 98.1 F (36.7 C) (Oral)   Resp 20   Ht 1.93 m (6\' 4" )   Wt 141 kg (310 lb 13.6 oz)   SpO2 93%   BMI 37.84 kg/m          Active Problems:    Physical debility

## 2018-06-06 NOTE — Progress Notes (Signed)
Patient has temp 101.5. Dr.Carrick notified. Awaiting new orders.

## 2018-06-06 NOTE — Progress Notes (Signed)
Voided additional 200 ml then requested to go to BR. Assisted to BR to VOid. Pain scale at 10. Pt needed to be reminded Non - weight bearing. Stated [name] the therapist wanted him to stand in BR with walker. Vided additional 260 mls.

## 2018-06-06 NOTE — Progress Notes (Signed)
Date of team conference: 06/05/2018  See patient care conference with the above date.  Reviewed team report with pt. [place] date has been set for 06/17/18. Pt will need HH PT/OT/Nsg at [place].Pt is in agreement with [place] date and team report. Pt is hoping to reach a functional level so that he does not require physical assist at [place].  Pt states that he has not talked with Dr. Silvestre Moment re: staying in apartment in her basement because she is on vacation. He states he will do that once she returns next week.  Pt will need a w/c at [facility]. Pt is up for recert on 06/10/18 and we will be asking for more time. Pt has no other issues or concerns at this time.

## 2018-06-06 NOTE — Progress Notes (Signed)
PHYSICAL MEDICINE AND REHABILITATION PROGRESS NOTE    Date: 06/06/18 Time: 10:34 AM  Patient Name: [name], [name]    CC:  On consult for functional and rehabilitative needs; general pain management; and assessment of medical complexities and co-morbidities as barriers to rehabilitation.      SUBJECTIVE     Patient continues to have 10 out of 10 pain with transitional movements.  Discussed pain management with internal medicine.  Concern is for developing hyponatremia and whether the tramadol and Neurontin are contributing.  May need to consider addition of long-acting pain medication.    Therapy Current Functional Levels:   Overall ADL Level of Assist: Max Assist  Toilet Transfer Level of Assist: Max Assist  Tub/Shower Level of Assist: Not Attempted  Ambulation Distance: 5   Ambulation Level of Assist: Min Assist  Bed/Chair/Wheelchair Level of Assist: Mod Assist       REVIEW OF SYSTEM     Constitutional:  Denies fever/chills/unexplained weight changes  HEENT:  Denies headache, vision changes  CV:        Denies chest pain/palpitations  Pulm:    Denies shortness of breath  GI:      Denies nausea/vomitting/abd pain; Regular bowel patterns  GU:    Denies bloody/burning urination/trouble controlling bladder  Neuro:   Denies weakness;  Denies paresthesia  Musculoskeletal:   Denies new falls/injuries  Psych: mood euthymic    MEDICATIONS   SCHEDULED MEDICATIONS  Current Facility-Administered Medications   Medication Dose Route Frequency   . acetaminophen  650 mg Oral Q4H   . amLODIPine  10 mg Oral Daily   . apixaban  5 mg Oral Q12H   . bisacodyl  5 mg Oral Daily   . carvedilol  6.25 mg Oral BID Meals   . furosemide  40 mg Oral Once   . glipiZIDE  10 mg Oral BID AC   . insulin lispro  2-16 Units Subcutaneous TID AC    And   . insulin lispro  1-9 Units Subcutaneous QHS   . losartan  50 mg Oral Daily   . metFORMIN  500 mg Oral BID Meals   . polyethylene glycol  17 g Oral BID   . SITagliptin  50 mg Oral BID   . tiZANidine   4 mg Oral BID       AS NEEDED MEDICATIONS  acetaminophen, diphenhydrAMINE, diphenhydrAMINE, gabapentin, NSG Communication: Glucose POCT order (AC, HS) **AND** insulin lispro **AND** insulin lispro **AND** insulin lispro, naloxone, ondansetron **OR** ondansetron, ondansetron, oxyCODONE, oxyCODONE, promethazine **OR** promethazine **OR** promethazine, traMADol    PHYSICAL EXAMINATION   VITAL SIGNS  Vitals:    06/06/18 0741   BP: 145/79   Pulse: 91   Resp:    Temp:    SpO2:          EXAMINATION  GENERAL:                  Alert and oriented X 3. NAD, Pleasant and cooperative.  Transfers with min assist to contact-guard but significant discomfort.  LUNGS:                       Clear to auscultation, no wheezing, no rhonchi  HEART:                       Irregular  Rhythm without murmur, gallops, or rubs  ABDOMEN:      Soft, nontender, bowel sounds present. No organomegaly. No rebound tenderness  EXTREMITIES:           No pain, cyanosis, or edema. Neg Homan's on left.  Significant RLE edema, hip to toes.  Right anterior groin dressing with serous drainage proximally (clear yellow serous, less erythema surrounding staples).  Lower lateral incision c/d/i  NEUROLOGIC:           Alert, oriented, non-focal findings  SKIN:                           Intact  MOTOR:                      Functional strength throughout left.  RLE limited by pain.  Dorsiflexion/plantarflexion 5/5  SENS:                         Intact bilat to PP/LT, proprioception    OBJECTIVE DATA   Laboratory  Results     Procedure Component Value Units Date/Time    Basic Metabolic Panel [[id number]]  (Abnormal) Collected:  06/06/18 0537    Specimen:  Plasma Updated:  06/06/18 0843     Sodium 131 (L) mMol/L      Potassium 4.3 mMol/L      Chloride 98 mMol/L      CO2 26 mMol/L      Calcium 8.7 mg/dL      Glucose 191 (H) mg/dL      Creatinine 4.78 (L) mg/dL      BUN 16 mg/dL      Anion Gap 29.5 mMol/L      BUN/Creatinine Ratio 21.9 Ratio      EGFR 100 mL/min/1.88m2       Osmolality Calc 266 (L) mOsm/kg     CBC with Automated Differential [[id number]]  (Abnormal) Collected:  06/06/18 0537    Specimen:  Blood from Blood Updated:  06/06/18 0812     WBC 10.8 K/cmm      RBC 2.52 (L) M/cmm      Hemoglobin 8.6 (L) gm/dL      Hematocrit 84.6 (L) %      MCV 102 (H) fL      MCH 34 pg      MCHC 33 gm/dL      RDW 96.2 (H) %      PLT CT 436 K/cmm      MPV 7.2 fL      NEUTROPHIL % 79.9 (H) %      Lymphocytes 10.1 (L) %      Monocytes 9.3 %      Eosinophils % 0.4 %      Basophils % 0.4 %      Neutrophils Absolute 8.6 K/cmm      Lymphocytes Absolute 1.1 K/cmm      Monocytes Absolute 1.0 K/cmm      Eosinophils Absolute 0.0 K/cmm      BASO Absolute 0.0 K/cmm     Dextrose Stick Glucose [[id number]]  (Abnormal) Collected:  06/06/18 0737    Specimen:  Blood Updated:  06/06/18 0754     Glucose, POCT 180 (H) mg/dL     Dextrose Stick Glucose [[id number]]  (Abnormal) Collected:  06/06/18 0303    Specimen:  Blood Updated:  06/06/18 0320     Glucose, POCT 146 (H) mg/dL     Dextrose Stick Glucose [[id number]]  (Abnormal) Collected:  06/05/18 2050    Specimen:  Blood Updated:  06/05/18 2107  Glucose, POCT 169 (H) mg/dL     Dextrose Stick Glucose [[id number]]  (Abnormal) Collected:  06/05/18 1709    Specimen:  Blood Updated:  06/05/18 1726     Glucose, POCT 154 (H) mg/dL     Dextrose Stick Glucose [[id number]]  (Abnormal) Collected:  06/05/18 1225    Specimen:  Blood Updated:  06/05/18 1242     Glucose, POCT 113 (H) mg/dL           Micro  Microbiology Results     None            Radiology  No orders to display         ASSESSMENT   SUMMARY STATEMENT:  The patient states that he came to [facility] for an elective right total hip replacement.  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  He was recovering well from the hip replacement when he had a fall landing on the right knee and suffered a periprosthetic  hip fracture.  Patient underwent surgical repair and is currently nonweightbearing.  He does state that knee pain and generalized pain are somewhat limiting.  Last bowel movement was yesterday.  Post op required 4 Units PRBC and developed hyponatremia.      Diagnosis:     Active Hospital Problems    Diagnosis   . Physical debility   1.  S/p right periprosthetic hip fracture after THR.  NWB.              -f/u Dr. Emeline Darling 7/16 at 2pm                gait / Impaired mobility, ADLs / Fall risks / Debility - OT /PT, Fall precautions      Continue with current rehabilitative and medical management plan as discussed above.   Patient is participating in therapy, working towards goals. Refer to therapy notes in EHR for details of current progress.    Coordination of care:   Rehabilitation goal setting and medical management plan discussed with therapy, nursing, and hospitalist team in AM meetings  Medication list and pertinent lab work reviewed.   Medical management as per hospitalist  Patient to continue acute rehabilitation.     [place] Goal: 7/16 prior to ortho f/u, HH PT/OT/RN    2.  DVT risks - eliquis  3.  Pain - oxydocone x 5, tylenol 650 x 6.  IM d/c'd neurontin.  Patient not taking tramadol.    4.   PVRs, bowel program 7/5  5.  DM-per IM  6.  Afib/flutter - on eliquis  7.  HTN/CAD-coreg, norvasc  8.  SIADH - improving, off fluid restrictions, resume fluid restrictions and lasix per IM  9.  Post op anemia - monitor  10.  Wound care, dressing changes q shift, improving    PLAN   Patient is participating in therapy, working towards goals.   Refer to therapy notes in EHR for details of current progress.  Patient to continue acute rehabilitation  Continue with current medical management plan as per Internal Medicine hospitalist attending team.       Signed by:   Lambert Mody, MD  Physical Medicine and Rehabilitation

## 2018-06-06 NOTE — UM Notes (Signed)
Discharge summary for [name]  02-Dec-1956  Auth# [id number]      "  Medicine Discharge Summary - [facility]  [employer]   Patient Name: [name], [name]   Attending Physician: Alease Medina, MD PCP: Md, Out Of Network   Date of Admission: 05/26/2018 D/C Date: 06/03/2018   Discharge Diagnoses:     # post hemorrhagic anemia    # Hyponatremia likely SIADH- possibly from stress/surgery    # Acute femur fracture, right periprosthetic    #Osteoarthritis of right hip s/p right hip replacement    # Coronary artery disease    # Atrial fibrillation/flutter    #Diabetes mellitus    #Hypertension     Hospital Course     [name] is a 62 y.o. male with h/o AF/flutter on eliquis, CAD s/p CABG, DM, HTN, who had scheduled right hip arthroplasty on 6-17 for chronic arthritis and discharged home and ha a fall causing injury to right knee with significant right leg pain.   XR showed periprosthetic femur fracture.    The patient was seen by Dr Emeline Darling and after no evidence of acetabular abnormality on CT scan was conformed, he underwent ORIF by Dr Emeline Darling.  Post op course complicated by post hemorrhagic anemia requiring 2 units pRBC transfusion. He also developed mild acute hyponatremia likely SIADH from pain/stress from surgery which improved   with fluid restriction for a day.  All his home meds were resumed post surgery and he began tolerating PT well. He will be discharged to rehab today.      Pending Results and other significant studies:  none   Discharge Instructions:        Disposition:  SNF  Diet: Cardiac Consistent Carbohydrates  Activity: As tolerated  Discharge Code Status: Full Code  Lucretia Kern Richrd Prime, MD  [address]  [place] [address]  [phone]    Follow up  July 16 at 2:00 pm     Md, Out Of Network           Discharge Medications:                                                                     Current Inpatient Medications with Last Dose  Taken            Current Facility-Administered Medications   Medication Dose Route Frequency Last Rate Last Dose   . acetaminophen (TYLENOL) tablet 650 mg  650 mg Oral Q4H PRN       . acetaminophen (TYLENOL) tablet 650 mg  650 mg Oral Q4H   650 mg at 06/03/18 0754   . amLODIPine (NORVASC) tablet 10 mg  10 mg Oral Daily   10 mg at 06/03/18 0755   . apixaban (ELIQUIS) tablet 5 mg  5 mg Oral Q12H   5 mg at 06/03/18 0755   . bisacodyl (DULCOLAX) EC tablet 5 mg  5 mg Oral Daily   5 mg at 06/01/18 1043   . carvedilol (COREG) tablet 6.25 mg  6.25 mg Oral BID Meals   6.25 mg at 06/03/18 0755   . dextrose (D10W) 10% bolus 125 mL  125 mL Intravenous PRN       . diphenhydrAMINE (BENADRYL) capsule  25 mg  25 mg Oral Q12H PRN       . diphenhydrAMINE (BENADRYL) capsule 25 mg  25 mg Oral QHS PRN       . gabapentin (NEURONTIN) capsule 100 mg  100 mg Oral Q8H SCH   100 mg at 06/03/18 1317   . glipiZIDE (GLUCOTROL) tablet 10 mg  10 mg Oral BID AC   10 mg at 06/03/18 0754   . glucagon (rDNA) (GLUCAGEN) injection 1 mg  1 mg Intramuscular PRN       . hydrALAZINE (APRESOLINE) injection 10 mg  10 mg Intravenous Q6H PRN       . insulin lispro (HumaLOG) injection pen 2-16 Units  2-16 Units Subcutaneous TID AC   2 Units at 06/03/18 1250    And   . insulin lispro (HumaLOG) injection pen 1-9 Units  1-9 Units Subcutaneous QHS   1 Units at 06/02/18 2125    And   . insulin lispro (HumaLOG) injection pen 1-9 Units  1-9 Units Subcutaneous Daily PRN   1 Units at 06/03/18 0329   . lactobacillus species (BIO-K PLUS) capsule 50 Billion CFU  50 Billion CFU Oral Daily   50 Billion CFU at 06/03/18 1018   . losartan (COZAAR) tablet 50 mg  50 mg Oral Daily   50 mg at 06/03/18 0756   . metFORMIN (GLUCOPHAGE) tablet 500 mg  500 mg Oral BID Meals   500 mg at 06/03/18 0754   . morphine injection 2 mg  2 mg Intravenous Q2H PRN   2 mg at 06/02/18 0848   . naloxone [facility]) injection 0.4 mg  0.4 mg Intravenous PRN       . ondansetron (ZOFRAN) injection 4 mg  4 mg  Intravenous Q6H PRN   4 mg at 05/29/18 0350   . ondansetron (ZOFRAN-ODT) disintegrating tablet 4 mg  4 mg Oral Q8H PRN        Or   . ondansetron (ZOFRAN) injection 4 mg  4 mg Intravenous Q8H PRN       . oxyCODONE (ROXICODONE) immediate release tablet 10 mg  10 mg Oral Q3H PRN   10 mg at 06/03/18 1516   . oxyCODONE (ROXICODONE) immediate release tablet 5 mg  5 mg Oral Q3H PRN       . polyethylene glycol (MIRALAX) packet 17 g  17 g Oral BID   17 g at 06/03/18 1018   . promethazine (PHENERGAN) tablet 25 mg  25 mg Oral Q6H PRN        Or   . promethazine (PHENERGAN) suppository 12.5 mg  12.5 mg Rectal Q6H PRN        Or   . promethazine (PHENERGAN) IM injection 6.25 mg  6.25 mg Intramuscular Q6H PRN       . SITagliptin (JANUVIA) tablet 50 mg  50 mg Oral BID   50 mg at 06/03/18 1018   . sodium chloride (PF) 0.9 % injection 3 mL  3 mL Intravenous Q8H   3 mL at 06/03/18 0617   . tiZANidine (ZANAFLEX) tablet 4 mg  4 mg Oral BID   4 mg at 06/03/18 1018   . traMADol (ULTRAM) tablet 50 mg  50 mg Oral 4 times per day   50 mg at 06/03/18 1251         Discharge Day Exam (06/03/2018):   Blood pressure 149/82, pulse 87, temperature 98.4 F (36.9 C), temperature source Oral, resp. rate 16, height 1.93 m (6\' 4" ), weight 135.5  kg (298 lb 11.6 oz), SpO2 97 %.    General: Patient is awake. In no acute distress.  HEENT: PERRL, EOMI, no conjunctival drainage, vision is intact.  Neck: supple, no thyromegaly.  Chest: CTA bilaterally. No rhonchi, no wheezing. No use of accessory muscles.  CVS: normal rate and regular rhythm no murmurs, without JVD.  Abdomen: soft, non-tender, no guarding or rigidity, with normal bowel sounds.  Extremities: RLE pitting edema, pulses palpable, no calf swelling and gross no deformity.  Skin: Warm, dry, no rash and no worrisome lesions.  NEURO: no motor or sensory deficits.  Psychiatric: alert, interactive, appropriate, normal affect.   Recent Labs      Recent Labs  Lab 06/03/18  0538 06/02/18  1032  06/01/18  0656 05/31/18  0557 05/30/18  0448   WBC 9.8 11.9* 9.6 10.1 10.3   RBC 2.61* 2.82* 2.57* 2.48* 2.14*   Hemoglobin 8.9* 9.8* 8.8* 8.3* 7.4*   Hematocrit 26.5* 28.7* 26.0* 25.0* 22.0*   MCV 102* 102* 101* 101* 103*   PLT CT 419 469* 351 301 307       Recent Labs  Lab 05/28/18  0956   PT 11.1   PT INR 1.1               Lab Results   Component Value Date    HGBA1CPERCNT 5.5 05/19/2018       Recent Labs  Lab 06/02/18  1032 06/01/18  0656 05/31/18  0557 05/30/18  0448 05/28/18  0956   Glucose 178* 168* 125* 126* 199*   Sodium 135* 133* 130* 132* 133*   Potassium 4.7 4.7 4.3 4.9 4.7   Chloride 102 101 101 101 99   CO2 24.1 24 22.4 26.5 26.7   BUN 17 18 15 22  25*   Creatinine 0.82 0.68* 0.71* 0.88 0.89   EGFR 95 103 101 93 92   Calcium 9.2 8.8 8.3* 7.9* 8.8          Allergies:      Patient has no known allergies.   Time spent on discharging the patient:  40 minutes   Xr Chest 2 Views    Result Date: 06/01/2018  IMPRESSION: Minimal vascular prominence without congestive failure or focal infiltrate. ReadingStation:[id number]    Xr Knee 1 Or 2 Views Right    Result Date: 06/02/2018  1. Small suprapatellar joint effusion and diffuse soft tissue swelling of the thigh without acute bony pathology. ReadingStation:[id number]    Xr Femur Right 1 View (see Comments)    Result Date: 05/29/2018  1. Appropriate positioning of the new right femoral prosthesis. 2. Right femoral diaphyseal fracture immobilized with a longer stem and reinforced with 2 wire loops. ReadingStation:[email or web address]     Alease Medina, MD         06/03/18 3:39 PM   MRN: [id number]                                      CSN: [id number] DOB: [birth year]           Alvin Critchley RN  [facility]  Utilization Review  P [phone]  F [phone]

## 2018-06-06 NOTE — Progress Notes (Signed)
MEDICAL PROGRESS NOTE  [facility]      Patient: [name]  Date: 06/06/2018   LOS: 3 Days  Admission Date: 06/03/2018   MRN: [id number]  Attending: Ronald Lobo MD     ASSESSMENT/PLAN     [name] is a 62 y.o. male with PMH significant for CAD, s/p bypass, aflutter on Eliquis,  DM2 on oral meds, htn, OA     Patient was hospitalized at [facility] 6/24-7/2.  Pt presented with right hip pain and right knee pain after falling. 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 Ivar Bury. He was exercising/doing physical therapy and fell. He landed on his right knee and then on his right hip. Xray's revealed  periprosthetic femur fracture.  Pt was seen by Dr Emeline Darling and underwent ORIF.  Pt has post op anemia and required 2 units of prbc.  Pt had mild hyponatremia as well felt likely to be SIADH, he was treated with salt and fluid restriction with improvement.  Pt tolerating oral intake,   had a bm yesterday.  Pt's pain is controlled on tylenol, ultram and oxycodone.  Pt is currently awake, alert, denies sob, no cp, no n/v. Pt has been doing well with PT. Sugars have been under fair control in hospital. Pt states sugars at home much better and recent hga1c was down in the 5 range.       1. Physical debility after fall and periprosthetic right hip fracture s/p ORIF - cont acute rehab.  Pt/ot/pmr following.  F/u with Dr Emeline Darling on July 16  2. DM2 - cont oral regimen. Adjusted insulin as needed.  Sugars currently acceptable.   3. Post op blood loss anemia - s/p 2 units after surgery and had 2 units day of surgery.  Recheck today is stable.  4. Post op SIADH - was getting better, now worse today at 131.  Possible medication effect at this point with scheduled ultram and neurontin.  Will change thses meds to prn as he needs for control of his symtoms per pmr. Will resume a fluid restriction.  Give a dose of lasix today which he takes prn at home.  Check daily wts.  Repeat bmp in am.    5.  Hx of  afib/flutter, cad, htn - cont coreg, eliquis, norvasc.  Elevated bp again this am in setting of pain, and hadn't received his coreg or norvasc yet.  Pt's overall trend last few days have been elevated, d/w pt increasing coreg, he is currently reluctant to do this as he had low blood pressures in the past at this dose, pt requests waiting a few more days and see how it trends which is very reasonable.  Lasix today may improve bp as well.   6.  Constipation proph - cont bowel regimen.  Last bm 7/3.           DVT/VTE Prophylaxis:  Eliquis    Follow up:  Dr Emeline Darling July 16    Care Plan discussed with nursing, consultants, case manager when possible    SUBJECTIVE     Pt had uneventful evening.  Doing well currently.  No sob, no n/v, no cp, tolerating po's,  pain fairly controlled.        Therapy Current Functional Levels:   Overall ADL Level of Assist: Max Assist  Toilet Transfer Level of Assist: Max Assist  Tub/Shower Level of Assist: Not Attempted  Ambulation Distance: 5 feet  Ambulation Level of Assist:  Min Assist  Bed/Chair/Wheelchair Level of Assist: Mod Assist       MEDICATIONS     Current Facility-Administered Medications   Medication Dose Route Frequency   . acetaminophen  650 mg Oral Q4H   . amLODIPine  10 mg Oral Daily   . apixaban  5 mg Oral Q12H   . bisacodyl  5 mg Oral Daily   . carvedilol  6.25 mg Oral BID Meals   . furosemide  40 mg Oral Once   . glipiZIDE  10 mg Oral BID AC   . insulin lispro  2-16 Units Subcutaneous TID AC    And   . insulin lispro  1-9 Units Subcutaneous QHS   . losartan  50 mg Oral Daily   . metFORMIN  500 mg Oral BID Meals   . polyethylene glycol  17 g Oral BID   . SITagliptin  50 mg Oral BID   . tiZANidine  4 mg Oral BID       ROS     Remainder of 10 point ROS as above or otherwise negative    PHYSICAL EXAM     Vitals:    06/06/18 0741   BP: 145/79   Pulse: 91   Resp:    Temp:    SpO2:        Temperature: Temp  Min: 98.1 F (36.7 C)  Max: 99.9 F (37.7 C)  Pulse: Pulse  Min: 83   Max: 91  Respiratory: Resp  Min: 18  Max: 20  Non-Invasive BP: BP  Min: 143/74  Max: 149/73  Pulse Oximetry SpO2  Min: 93 %  Max: 97 %    Intake and Output Summary (Last 24 hours) at Date Time    Intake/Output Summary (Last 24 hours) at 06/06/18 1208  Last data filed at 06/06/18 0900   Gross per 24 hour   Intake             1170 ml   Output             3774 ml   Net            -2604 ml      Wt Readings from Last 3 Encounters:   06/03/18 141 kg (310 lb 13.6 oz)   05/26/18 135.5 kg (298 lb 11.6 oz)   05/19/18 136.1 kg (300 lb 0.7 oz)         GEN APPEARANCE: NAD;    HEENT: Conjunctiva Clear; Mucous membranes moist  CVS: RRR, S1, S2; No M/G/R  LUNGS: CTAB; No Wheezes; No Rhonchi: No rales   ABD: Soft; Non-tender, + Normoactive BS   EXT: right leg edema, bruising; Pulses 2+ and intact.  Staples are intact, wound healing.   SKIN: No rash or Lesions  NEURO: No Focal neurological deficits      LABS       Recent Labs  Lab 06/06/18  0537 06/03/18  0538 06/02/18  1032   WBC 10.8 9.8 11.9*   RBC 2.52* 2.61* 2.82*   Hemoglobin 8.6* 8.9* 9.8*   Hematocrit 25.7* 26.5* 28.7*   MCV 102* 102* 102*   PLT CT 436 419 469*         Recent Labs  Lab 06/06/18  0537 06/02/18  1032 06/01/18  0656 05/31/18  0557   Sodium 131* 135* 133* 130*   Potassium 4.3 4.7 4.7 4.3   Chloride 98 102 101 101   CO2 26 24.1 24 22.4   BUN 16  17 18 15    Creatinine 0.73* 0.82 0.68* 0.71*   Glucose 146* 178* 168* 125*   Calcium 8.7 9.2 8.8 8.3*         Recent Labs  Lab 05/31/18  1510   Osmolality, UR 771   Sodium, UR 44                         (!) 180 (The above 1 analytes were performed by [facility] [phone]) [address],[place] [address] )    Microbiology, reviewed and significant for:  No results for input(s): MICRO in the last 24 hours.      RADIOLOGY     Radiological Procedure reviewed.    No orders to display       Signed,  Ronald Lobo, MD  12:08 PM 06/06/2018

## 2018-06-06 NOTE — Progress Notes (Signed)
Voided 900 mls. PVR - 601. Encouraged to attempt to void again as bladder still full.

## 2018-06-07 ENCOUNTER — Inpatient Hospital Stay: Payer: BC Managed Care – PPO

## 2018-06-07 LAB — VH DEXTROSE STICK GLUCOSE
Glucose POCT: 162 mg/dL — ABNORMAL HIGH (ref 71–99)
Glucose POCT: 163 mg/dL — ABNORMAL HIGH (ref 71–99)
Glucose POCT: 138 mg/dL — ABNORMAL HIGH (ref 71–99)
Glucose POCT: 135 mg/dL — ABNORMAL HIGH (ref 71–99)
Glucose POCT: 184 mg/dL — ABNORMAL HIGH (ref 71–99)

## 2018-06-07 LAB — BASIC METABOLIC PANEL
Anion Gap: 13.1 mMol/L (ref 7.0–18.0)
BUN / Creatinine Ratio: 23.4 Ratio (ref 10.0–30.0)
BUN: 18 mg/dL (ref 7–22)
CO2: 25.2 mMol/L (ref 20.0–30.0)
Calcium: 9 mg/dL (ref 8.5–10.5)
Chloride: 98 mMol/L (ref 98–110)
Creatinine: 0.77 mg/dL — ABNORMAL LOW (ref 0.80–1.30)
EGFR: 98 mL/min/{1.73_m2} (ref 60–150)
Glucose: 122 mg/dL — ABNORMAL HIGH (ref 71–99)
Osmolality Calc: 268 mOsm/kg — ABNORMAL LOW (ref 275–300)
Potassium: 4.3 mMol/L (ref 3.5–5.3)
Sodium: 132 mMol/L — ABNORMAL LOW (ref 136–147)

## 2018-06-07 MED ORDER — LANTUS SOLOSTAR 100 UNIT/ML SC SOPN
5.00 [IU] | PEN_INJECTOR | Freq: Every evening | SUBCUTANEOUS | Status: DC | PRN
Start: 2018-06-07 — End: 2018-06-08
  Administered 2018-06-07: 21:00:00 5 [IU] via SUBCUTANEOUS
  Filled 2018-06-07: qty 3

## 2018-06-07 MED ORDER — METFORMIN HCL 500 MG PO TABS
500.00 mg | ORAL_TABLET | Freq: Once | ORAL | Status: AC
Start: 2018-06-07 — End: 2018-06-07
  Administered 2018-06-07: 13:00:00 500 mg via ORAL
  Filled 2018-06-07: qty 1

## 2018-06-07 NOTE — Progress Notes (Signed)
Blood cultures and UA collected and sent.

## 2018-06-07 NOTE — Plan of Care (Signed)
Problem: Pain interferes with ability to perform ADL  Goal: Pain at adequate level as identified by patient  Outcome: Progressing   06/03/18 2323   Goal/Interventions addressed this shift   Pain at adequate level as identified by patient Assess pain on admission, during daily assessment and/or before any "as needed" intervention(s);Reassess pain within 30-60 minutes of any procedure/intervention, per Pain Assessment, Intervention, Reassessment (AIR) Cycle;Evaluate if patient comfort function goal is met;Include patient/patient care companion in decisions related to pain management as needed;Consult/collaborate with Physical Therapy, Occupational Therapy, and/or Speech Therapy;Offer non-pharmacological pain management interventions;Evaluate patient's satisfaction with pain management progress     NURSE NOTE SUMMARY     Patient Name: Ned Card   Attending Physician: Vernona Rieger, MD   Primary Care Physician: Md, Out Of Network   Date of Admission:   06/03/2018   Today's date:   06/07/2018 LOS: 4 days   Shift Summary:                                                              Medicated with oxycodone 10 mg, scheduled tylenol and PRN neurontin per MAR. Dressing changed to right groin. Red and moist with serosanguinous drainage. Temp of 101.60F, blood cultures obtained and UA sent. New orders for keflex started tonight. FS at HS 259 given 2 units SSI. Call bell with in reach and bed alarm set.   Provider Notifications:      Rapid Response Notifications:  Mobility:              Weight tracking:  Family Dynamic:     No data found.      Recent Vitals:  Active Problems:     BP 144/56   Pulse 95   Temp (!) 101.5 F (38.6 C) (Oral)   Resp 17   Ht 1.93 m (6\' 4" )   Wt 141 kg (310 lb 13.6 oz)   SpO2 95%   BMI 37.84 kg/m          Active Problems:    Physical debility

## 2018-06-07 NOTE — Progress Notes (Addendum)
MEDICAL PROGRESS NOTE  [facility]      Patient: [name]  Date: 06/07/2018   LOS: 4 Days  Admission Date: 06/03/2018   MRN: [id number]  Attending: Ronald Lobo MD     ASSESSMENT/PLAN     [name] is a 62 y.o. male with PMH significant for CAD, s/p bypass, aflutter on Eliquis,  DM2 on oral meds, htn, OA     Patient was hospitalized at [facility] 6/24-7/2.  Pt presented with right hip pain and right knee pain after falling. 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 Ivar Barr. He was exercising/doing physical therapy and fell. He landed on his right knee and then on his right hip. Xray's revealed  periprosthetic femur fracture.  Pt was seen by Dr [name] and underwent ORIF.  Pt has post op anemia and required 2 units of prbc.  Pt had mild hyponatremia as well felt likely to be SIADH, he was treated with salt and fluid restriction with improvement.  Pt tolerating oral intake,   had a bm yesterday.  Pt's pain is controlled on tylenol, ultram and oxycodone.  Pt is currently awake, alert, denies sob, no cp, no n/v. Pt has been doing well with PT. Sugars have been under fair control in hospital. Pt states sugars at home much better and recent hga1c was down in the 5 range.       1. Physical debility after fall and periprosthetic right hip fracture s/p ORIF - cont acute rehab.  Pt/ot/pmr following.  F/u with Dr [name] on July 16  2. New Fever spike overnight, wound cellulitis - no obvious source other than his surgical wound which has had mild erythema and drainage since admission. Cbc wnl 7/5.   Blood cx's sent, u/a sent, cxr today for completeness. Keflex started last evening.  Recheck cbc on Monday.  Addemdum: cxr negative.   2. DM2 - cont oral regimen. Adjusted insulin as needed.  Will increase metformin for better control while here.  Add lantus at bedtime prn for better control while here.    3. Post op blood loss anemia - s/p 2 units after surgery and had 2 units day of surgery.   Recheck 7/5 was stable.  4. Post op SIADH - was getting better, worse 7/5  at 131.  Possible medication effect at this point with scheduled ultram and neurontin.  changed thses meds to prn as he needs for control of his symtoms per pmr. Resumed  fluid restriction 7/5. Pt given a dose of lasix 7/5  which he takes prn at home.  Checking  daily wts (d/w nurse wasn't done yesterday.  Today's wt is unchaged from 7/2.    Today's sodium improved to 132.  tighten fluid restriction,  Recheck in few days.      5.  Hx of afib/flutter, cad, htn - cont coreg, eliquis, norvasc.  Overall pressures are stable.   6.  Constipation proph - cont bowel regimen.  Last bm 7/3.           DVT/VTE Prophylaxis:  Eliquis    Follow up:  Dr [name] July 16    Care Plan discussed with nursing, consultants, case manager when possible    SUBJECTIVE     Pt had eventful evening with fever spike to 101.5.  Doing well currently.  No sob, no cough, has been using IS,  no n/v, no cp, tolerating po's,  pain controlled.  Therapy Current Functional Levels:   Overall ADL Level of Assist: Max Assist  Toilet Transfer Level of Assist: Max Assist  Tub/Shower Level of Assist: Not Attempted  Ambulation Distance: 0 feet  Ambulation Level of Assist: Not Attempted  Bed/Chair/Wheelchair Level of Assist: Mod Assist       MEDICATIONS     Current Facility-Administered Medications   Medication Dose Route Frequency   . acetaminophen  650 mg Oral Q4H   . amLODIPine  10 mg Oral Daily   . apixaban  5 mg Oral Q12H   . bisacodyl  5 mg Oral Daily   . carvedilol  6.25 mg Oral BID Meals   . cephALEXin  500 mg Oral QID   . Digestive Enzymes  1 capsule Oral TID MEALS   . glipiZIDE  10 mg Oral BID AC   . insulin lispro  2-16 Units Subcutaneous TID AC    And   . insulin lispro  1-9 Units Subcutaneous QHS   . lactobacillus species  50 Billion CFU Oral Daily   . losartan  50 mg Oral Daily   . metFORMIN  500 mg Oral BID Meals   . polyethylene glycol  17 g Oral BID   . SITagliptin   50 mg Oral BID   . tiZANidine  4 mg Oral BID       ROS     Remainder of 10 point ROS as above or otherwise negative    PHYSICAL EXAM     Vitals:    06/07/18 0839   BP: 134/70   Pulse: 98   Resp:    Temp: 98.2 F (36.8 C)   SpO2: 100%       Temperature: Temp  Min: 98.2 F (36.8 C)  Max: 101.5 F (38.6 C)  Pulse: Pulse  Min: 83  Max: 103  Respiratory: Resp  Min: 17  Max: 19  Non-Invasive BP: BP  Min: 134/70  Max: 144/56  Pulse Oximetry SpO2  Min: 95 %  Max: 100 %    Intake and Output Summary (Last 24 hours) at Date Time    Intake/Output Summary (Last 24 hours) at 06/07/18 1040  Last data filed at 06/07/18 0900   Gross per 24 hour   Intake              720 ml   Output             3450 ml   Net            -2730 ml      Wt Readings from Last 3 Encounters:   06/07/18 141 kg (310 lb 12.8 oz)   05/26/18 135.5 kg (298 lb 11.6 oz)   05/19/18 136.1 kg (300 lb 0.7 oz)         GEN APPEARANCE: NAD;    HEENT: Conjunctiva Clear; Mucous membranes moist  CVS: RRR, S1, S2; No M/G/R  LUNGS: CTAB; No Wheezes; No Rhonchi: No rales   ABD: Soft; Non-tender, + Normoactive BS   EXT: right leg edema, bruising; Pulses 2+ and intact.  Thigh incision - staples are intact, wound healing. Groin incision has surrounding erythema and serous drainage.  See pics in EPIC.   SKIN: No rash or Lesions  NEURO: No Focal neurological deficits      LABS       Recent Labs  Lab 06/06/18  0537 06/03/18  0538 06/02/18  1032   WBC 10.8 9.8 11.9*   RBC 2.52* 2.61* 2.82*  Hemoglobin 8.6* 8.9* 9.8*   Hematocrit 25.7* 26.5* 28.7*   MCV 102* 102* 102*   PLT CT 436 419 469*         Recent Labs  Lab 06/07/18  0400 06/06/18  0537 06/02/18  1032 06/01/18  0656   Sodium 132* 131* 135* 133*   Potassium 4.3 4.3 4.7 4.7   Chloride 98 98 102 101   CO2 25.2 26 24.1 24   BUN 18 16 17 18    Creatinine 0.77* 0.73* 0.82 0.68*   Glucose 122* 146* 178* 168*   Calcium 9.0 8.7 9.2 8.8         Recent Labs  Lab 05/31/18  1510   Osmolality, UR 771   Sodium, UR 44                          (!) 163 (The above 1 analytes were performed by [facility] [phone]) [address],[place], [address] )    Microbiology, reviewed and significant for:  No results for input(s): MICRO in the last 24 hours.      RADIOLOGY     Radiological Procedure reviewed.    XR Chest AP Portable    (Results Pending)       Signed,  Luis Lobo, MD  10:40 AM 06/07/2018

## 2018-06-07 NOTE — Plan of Care (Signed)
Problem: Moderate/High Fall Risk Score >5  Goal: Patient will remain free of falls  Outcome: Progressing   06/07/18 1100   High Risk Falls Interventions (Greater than 13)   VH High Risk (Greater than 13) ALL REQUIRED LOW INTERVENTIONS;RED "HIGH FALL RISK" SIGNAGE;ALL REQUIRED MODERATE INTERVENTIONS;BED ALARM WILL BE ACTIVATED WHEN THE PATEINT IS IN BED WITH SIGNAGE "RESET BED ALARM";A CHAIR PAD ALARM WILL BE USED WHEN PATIENT IS UP SITTING IN A CHAIR;PATIENT IS TO BE SUPERVISED FOR ALL TOILETING ACTIVITIES;Use assistive devices       Comments: Patient was assisted with transfers in room. Dressing on right hip surgical site was changed two times, large serosanguinous drainage was noted. MD was made aware. Patient was medicated for right hip pain with oxycodone as per prn order in the morning and afternoon after therapy. After dinner patient complained of feeling lightheadedness and blurred vision. Patient was sitting up in wheelchair in his room. BP was 95/53. Patient given 480cc fluids to drink. Patient reported that symptoms had not improved. Nurse rechecked BP with manual machine and BP was 98/58. Patient stated that he was feeling better and the symptoms were going away. Patient was put into bed

## 2018-06-08 LAB — VH DEXTROSE STICK GLUCOSE
Glucose POCT: 162 mg/dL — ABNORMAL HIGH (ref 71–99)
Glucose POCT: 215 mg/dL — ABNORMAL HIGH (ref 71–99)
Glucose POCT: 140 mg/dL — ABNORMAL HIGH (ref 71–99)
Glucose POCT: 140 mg/dL — ABNORMAL HIGH (ref 71–99)
Glucose POCT: 139 mg/dL — ABNORMAL HIGH (ref 71–99)

## 2018-06-08 MED ORDER — METFORMIN HCL 500 MG PO TABS
1000.00 mg | ORAL_TABLET | Freq: Two times a day (BID) | ORAL | Status: DC
Start: 2018-06-08 — End: 2018-06-20
  Administered 2018-06-08 – 2018-06-20 (×25): 1000 mg via ORAL
  Filled 2018-06-08 (×25): qty 2

## 2018-06-08 MED ORDER — TIZANIDINE HCL 4 MG PO TABS
4.00 mg | ORAL_TABLET | Freq: Two times a day (BID) | ORAL | Status: DC | PRN
Start: 2018-06-08 — End: 2018-06-10

## 2018-06-08 MED ORDER — FUROSEMIDE 40 MG PO TABS
40.00 mg | ORAL_TABLET | Freq: Every day | ORAL | Status: AC
Start: 2018-06-08 — End: 2018-06-10
  Administered 2018-06-08 – 2018-06-10 (×3): 40 mg via ORAL
  Filled 2018-06-08 (×3): qty 1

## 2018-06-08 MED ORDER — VH POTASSIUM CHLORIDE CRYS ER 10 MEQ PO TBCR (WRAP)
10.00 meq | EXTENDED_RELEASE_TABLET | Freq: Every day | ORAL | Status: AC
Start: 2018-06-08 — End: 2018-06-10
  Administered 2018-06-08 – 2018-06-10 (×3): 10 meq via ORAL
  Filled 2018-06-08 (×3): qty 1

## 2018-06-08 MED ORDER — LANTUS SOLOSTAR 100 UNIT/ML SC SOPN
5.00 [IU] | PEN_INJECTOR | Freq: Every evening | SUBCUTANEOUS | Status: DC
Start: 2018-06-08 — End: 2018-06-18
  Administered 2018-06-10 – 2018-06-16 (×6): 5 [IU] via SUBCUTANEOUS

## 2018-06-08 MED ORDER — METFORMIN HCL 500 MG PO TABS
500.00 mg | ORAL_TABLET | Freq: Once | ORAL | Status: AC
Start: 2018-06-08 — End: 2018-06-08
  Administered 2018-06-08: 10:00:00 500 mg via ORAL
  Filled 2018-06-08: qty 1

## 2018-06-08 MED ORDER — DIGESTIVE ENZYMES PO CAPS
3.00 | ORAL_CAPSULE | Freq: Two times a day (BID) | ORAL | Status: DC
Start: 2018-06-08 — End: 2018-06-20
  Administered 2018-06-08 – 2018-06-13 (×11): 3 via ORAL
  Administered 2018-06-14 – 2018-06-16 (×5): 2 via ORAL
  Administered 2018-06-16: 17:00:00 1 via ORAL
  Administered 2018-06-17: 17:00:00 2 via ORAL
  Administered 2018-06-17 – 2018-06-20 (×7): 3 via ORAL

## 2018-06-08 NOTE — Plan of Care (Addendum)
NURSE NOTE SUMMARY     Patient Name: [name]   Attending Physician: Vernona Rieger, MD   Primary Care Physician: Md, Out Of Network   Date of Admission:   06/03/2018   Today's date:   06/08/2018 LOS: 5 days   Shift Summary:                                                              Sleeping without complaints. Medicated for rt hip pain at 2130 with relief.  mepilex to rt hip intact and abd dressing to rt groin dry and intact.     Provider Notifications:      Rapid Response Notifications:  Mobility:              Weight tracking:  Family Dynamic:     Last 3 Weights for the past 72 hrs (Last 3 readings):   Weight   06/07/18 1019 141 kg (310 lb 12.8 oz)       Recent Vitals:  Active Problems:     BP 124/67   Pulse 82   Temp 98.8 F (37.1 C) (Oral)   Resp 16   Ht 1.93 m (6\' 4" )   Wt 141 kg (310 lb 12.8 oz)   SpO2 95%   BMI 37.83 kg/m          Active Problems:    Physical debility                   Problem: Moderate/High Fall Risk Score >5  Goal: Patient will remain free of falls  Outcome: Progressing

## 2018-06-08 NOTE — Progress Notes (Signed)
MEDICAL PROGRESS NOTE  [facility]      Patient: [name]  Date: 06/08/2018   LOS: 5 Days  Admission Date: 06/03/2018   MRN: [id number]  Attending: Ronald Lobo MD     ASSESSMENT/PLAN     [name] is a 62 y.o. male with PMH significant for CAD, s/p bypass, aflutter on Eliquis,  DM2 on oral meds, htn, OA     Patient was hospitalized at [facility] 6/24-7/2.  Pt presented with right hip pain and right knee pain after falling. 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 Ivar Bury. He was exercising/doing physical therapy and fell. He landed on his right knee and then on his right hip. Xray's revealed  periprosthetic femur fracture.  Pt was seen by Dr Emeline Darling and underwent ORIF.  Pt has post op anemia and required 2 units of prbc.  Pt had mild hyponatremia as well felt likely to be SIADH, he was treated with salt and fluid restriction with improvement.  Pt tolerating oral intake,   had a bm yesterday.  Pt's pain is controlled on tylenol, ultram and oxycodone.  Pt is currently awake, alert, denies sob, no cp, no n/v. Pt has been doing well with PT. Sugars have been under fair control in hospital. Pt states sugars at home much better and recent hga1c was down in the 5 range.       1. Physical debility after fall and periprosthetic right hip fracture s/p ORIF - cont acute rehab.  Pt/ot/pmr following.  F/u with Dr Emeline Darling on July 16.  Will change zanaflex to prn per pt's request to avoid taking when going to therapy or taking with pain med.   2. wound cellulitis - no obvious source other than his surgical wound which has had mild erythema and drainage since admission. CBC wnl 7/5.   Blood cx's NTD, u/a neg, cxr neg. Cont Keflex.   Recheck cbc on Monday.     2. DM2 - cont oral regimen. Adjusted insulin as needed.  increased metformin for better control while here.  added lantus at bedtime for better control while here, for sugars greater than 150.     3. Post op blood loss anemia - s/p 2  units after surgery and had 2 units day of surgery.  Recheck 7/5 was stable.  F/u in am.   4. Post op SIADH - was getting better, worse 7/5  at 131.  Possible medication effect at this point with scheduled ultram and neurontin.  changed these meds to prn as he needs for control of his symtoms per pmr. Resumed  fluid restriction 7/5. Pt given a dose of lasix 7/5  which he takes prn at home.  Checking  daily wts.  F/u Sat  Sodium slighty improved to 132.  tightened fluid restriction,  Recheck in am.  Will schedule lasix daily for next  3 days.      5.  Hx of afib/flutter, cad, htn - cont coreg, eliquis, norvasc.  Overall pressures are stable.   6.  Constipation proph - cont bowel regimen.  Last bm 7/6.           DVT/VTE Prophylaxis:  Eliquis    Follow up:  Dr Emeline Darling July 16    Care Plan discussed with nursing, consultants, case manager when possible    SUBJECTIVE     Pt had eventful evening, no further fever spikes .  Doing well currently.  No sob, no cough,  has been using IS,  no n/v, no cp, tolerating po's,  pain controlled.   Had low bp during therapy, which he attributes to taking zanaflex with pain which he has noted in past will drop his pressure.       Therapy Current Functional Levels:   Overall ADL Level of Assist: Max Assist  Toilet Transfer Level of Assist: Max Assist  Tub/Shower Level of Assist: Not Attempted  Ambulation Distance: 0 feet  Ambulation Level of Assist: Not Attempted  Bed/Chair/Wheelchair Level of Assist: Min Assist       MEDICATIONS     Current Facility-Administered Medications   Medication Dose Route Frequency   . acetaminophen  650 mg Oral Q4H   . amLODIPine  10 mg Oral Daily   . apixaban  5 mg Oral Q12H   . bisacodyl  5 mg Oral Daily   . carvedilol  6.25 mg Oral BID Meals   . cephALEXin  500 mg Oral QID   . Digestive Enzymes  3 capsule Oral BID Meals   . glipiZIDE  10 mg Oral BID AC   . insulin lispro  2-16 Units Subcutaneous TID AC    And   . insulin lispro  1-9 Units Subcutaneous QHS   .  lactobacillus species  50 Billion CFU Oral Daily   . losartan  50 mg Oral Daily   . metFORMIN  1,000 mg Oral BID Meals   . polyethylene glycol  17 g Oral BID   . SITagliptin  50 mg Oral BID       ROS     Remainder of 10 point ROS as above or otherwise negative    PHYSICAL EXAM     Vitals:    06/08/18 0944   BP: 144/85   Pulse: 87   Resp: 20   Temp: 99.3 F (37.4 C)   SpO2:        Temperature: Temp  Min: 98.8 F (37.1 C)  Max: 100.2 F (37.9 C)  Pulse: Pulse  Min: 82  Max: 106  Respiratory: Resp  Min: 16  Max: 20  Non-Invasive BP: BP  Min: 95/53  Max: 160/79  Pulse Oximetry SpO2  Min: 93 %  Max: 98 %    Intake and Output Summary (Last 24 hours) at Date Time    Intake/Output Summary (Last 24 hours) at 06/08/18 1209  Last data filed at 06/08/18 0800   Gross per 24 hour   Intake              720 ml   Output             1250 ml   Net             -530 ml      Wt Readings from Last 3 Encounters:   06/08/18 142.2 kg (313 lb 7.9 oz)   05/26/18 135.5 kg (298 lb 11.6 oz)   05/19/18 136.1 kg (300 lb 0.7 oz)         GEN APPEARANCE: NAD;    HEENT: Conjunctiva Clear; Mucous membranes moist  CVS: RRR, S1, S2; No M/G/R  LUNGS: CTAB; No Wheezes; No Rhonchi: No rales   ABD: Soft; Non-tender, + Normoactive BS   EXT: right leg edema, bruising; Pulses 2+ and intact.  Thigh incision - staples are intact, wound healing. Groin incision has surrounding erythema and serous drainage.  See pics in EPIC.   SKIN: No rash or Lesions  NEURO: No Focal neurological deficits  LABS       Recent Labs  Lab 06/06/18  0537 06/03/18  0538 06/02/18  1032   WBC 10.8 9.8 11.9*   RBC 2.52* 2.61* 2.82*   Hemoglobin 8.6* 8.9* 9.8*   Hematocrit 25.7* 26.5* 28.7*   MCV 102* 102* 102*   PLT CT 436 419 469*         Recent Labs  Lab 06/07/18  0400 06/06/18  0537 06/02/18  1032   Sodium 132* 131* 135*   Potassium 4.3 4.3 4.7   Chloride 98 98 102   CO2 25.2 26 24.1   BUN 18 16 17    Creatinine 0.77* 0.73* 0.82   Glucose 122* 146* 178*   Calcium 9.0 8.7 9.2              Invalid input(s): OSMOL                      (!) 215 (The above 1 analytes were performed by [facility] [phone]) [address],[place] [address] )    Microbiology, reviewed and significant for:  No results for input(s): MICRO in the last 24 hours.      RADIOLOGY     Radiological Procedure reviewed.    XR Chest AP Portable   Final Result      No acute airspace disease. Stable minimal vascular prominence but no congestive failure.      No acute airspace disease. No pneumothorax or pleural effusions.      Stable cardiomegaly. Previous CABG and sternotomy. DJD bilateral shoulders.      ReadingStation:[id number]          Signed,  Ronald Lobo, MD  12:09 PM 06/08/2018

## 2018-06-08 NOTE — Plan of Care (Signed)
Problem: Compromised Tissue integrity  Goal: Damaged tissue is healing and protected  Outcome: Progressing   06/08/18 1620   Goal/Interventions addressed this shift   Damaged tissue is healing and protected  Monitor/assess Braden scale every shift;Provide wound care per wound care algorithm;Reposition patient every 2 hours and as needed unless able to reposition self;Increase activity as tolerated/progressive mobility;Relieve pressure to bony prominences for patients at moderate and high risk;Keep intact skin clean and dry   Dressing changed on right hip and right lateral thigh incisional sites. Dressing was changed in the morning and afternoon on right hip incision. Moderate serosanguinous drainage noted on right hip site. No drainage noted on right lateral thigh dressing. Pictures taken on Ipad and saved in Epic    Comments: Patient was given oxycodone at 11:24 am  and 15:09pm for severe right hip pain. Ice pack was applied to right hip surgical site. Patient was in bed in the afternoon with right lower extremity positioned on pillow. Patient reported neuropathic right lower leg pain at 14:13pm and was given Neurontin as per prn order

## 2018-06-08 NOTE — Plan of Care (Addendum)
NURSE NOTE SUMMARY     Patient Name: Ned Card   Attending Physician: Vernona Rieger, MD   Primary Care Physician: Md, Out Of Network   Date of Admission:   06/03/2018   Today's date:   06/09/2018 LOS: 6 days   Shift Summary:                                                              Sleeping at present. Medicated with relief of rt groin pain. Temp 100.2 at 2000. Tylenol 650 given.  Down to 98.6 at 0100.     Provider Notifications:      Rapid Response Notifications:  Mobility:              Weight tracking:  Family Dynamic:     Last 3 Weights for the past 72 hrs (Last 3 readings):   Weight   06/08/18 0520 142.2 kg (313 lb 7.9 oz)   06/07/18 1019 141 kg (310 lb 12.8 oz)       Recent Vitals:  Active Problems:     BP 152/88   Pulse 96   Temp 98.5 F (36.9 C) (Oral)   Resp 18   Ht 1.93 m (6\' 4" )   Wt 142.2 kg (313 lb 7.9 oz)   SpO2 98%   BMI 38.16 kg/m          Active Problems:    Physical debility                   Problem: Moderate/High Fall Risk Score >5  Goal: Patient will remain free of falls  Outcome: Progressing

## 2018-06-09 LAB — BASIC METABOLIC PANEL
Anion Gap: 11.5 mMol/L (ref 7.0–18.0)
BUN / Creatinine Ratio: 23.9 Ratio (ref 10.0–30.0)
BUN: 16 mg/dL (ref 7–22)
CO2: 26 mMol/L (ref 20–30)
Calcium: 9.3 mg/dL (ref 8.5–10.5)
Chloride: 98 mMol/L (ref 98–110)
Creatinine: 0.67 mg/dL — ABNORMAL LOW (ref 0.80–1.30)
EGFR: 104 mL/min/{1.73_m2} (ref 60–150)
Glucose: 124 mg/dL — ABNORMAL HIGH (ref 71–99)
Osmolality Calc: 265 mOsm/kg — ABNORMAL LOW (ref 275–300)
Potassium: 4.5 mMol/L (ref 3.5–5.3)
Sodium: 131 mMol/L — ABNORMAL LOW (ref 136–147)

## 2018-06-09 LAB — CBC AND DIFFERENTIAL
Basophils %: 0 % (ref 0.0–3.0)
Basophils Absolute: 0 10*3/uL (ref 0.0–0.3)
Eosinophils %: 0 % (ref 0.0–7.0)
Eosinophils Absolute: 0 10*3/uL (ref 0.0–0.8)
Hematocrit: 26.2 % — ABNORMAL LOW (ref 39.0–52.5)
Hemoglobin: 8.5 gm/dL — ABNORMAL LOW (ref 13.0–17.5)
Lymphocytes Absolute: 1.1 10*3/uL (ref 0.6–5.1)
Lymphocytes: 11 % — ABNORMAL LOW (ref 15.0–46.0)
MCH: 33 pg (ref 28–35)
MCHC: 32 gm/dL (ref 32–36)
MCV: 101 fL — ABNORMAL HIGH (ref 80–100)
MPV: 7.2 fL (ref 6.0–10.0)
Monocytes Absolute: 0.6 10*3/uL (ref 0.1–1.7)
Monocytes: 6 % (ref 3.0–15.0)
Neutrophils %: 83 % — ABNORMAL HIGH (ref 42.0–78.0)
Neutrophils Absolute: 8.5 10*3/uL (ref 1.7–8.6)
PLT CT: 480 10*3/uL — ABNORMAL HIGH (ref 130–440)
RBC: 2.59 10*6/uL — ABNORMAL LOW (ref 4.00–5.70)
RDW: 16.9 % — ABNORMAL HIGH (ref 11.0–14.0)
WBC: 10.2 10*3/uL (ref 4.0–11.0)

## 2018-06-09 LAB — VH DEXTROSE STICK GLUCOSE
Glucose POCT: 172 mg/dL — ABNORMAL HIGH (ref 71–99)
Glucose POCT: 143 mg/dL — ABNORMAL HIGH (ref 71–99)
Glucose POCT: 133 mg/dL — ABNORMAL HIGH (ref 71–99)
Glucose POCT: 148 mg/dL — ABNORMAL HIGH (ref 71–99)

## 2018-06-09 MED ORDER — CARVEDILOL 6.25 MG PO TABS
9.3750 mg | ORAL_TABLET | Freq: Two times a day (BID) | ORAL | Status: DC
Start: 2018-06-09 — End: 2018-06-12
  Administered 2018-06-09 – 2018-06-12 (×6): 9.375 mg via ORAL
  Filled 2018-06-09 (×12): qty 1

## 2018-06-09 NOTE — Plan of Care (Signed)
Problem: Moderate/High Fall Risk Score >5  Goal: Patient will remain free of falls  Outcome: Progressing   06/09/18 1516   Moderate Risk Falls Interventions (6-13)   VH Moderate Risk (6-13) ALL REQUIRED LOW INTERVENTIONS   OTHER   Moderate Risk (6-13) LOW-Fall Interventions Appropriate for Low Fall Risk   High (Greater than 13) LOW-Fall Interventions Appropriate for Low Fall Risk   High Risk Falls Interventions (Greater than 13)   VH High Risk (Greater than 13) ALL REQUIRED LOW INTERVENTIONS       Problem: Pain interferes with ability to perform ADL  Goal: Pain at adequate level as identified by patient  Outcome: Progressing

## 2018-06-09 NOTE — Progress Notes (Signed)
Dr.Gore's office called, spoke to nurse. She stated that they looked at the epic photos of his wound and they do not wish to see the patient at this time (stating that the photos look improved from 7/3). They stated to continue to monitor the wound and to call back and schedule an appointment to be seen if it worsens (more redness, swelling, higher fever etc...) or if there is no improvement in a few days. Nurse stated to also continue keflex.

## 2018-06-09 NOTE — Progress Notes (Signed)
Right upper thigh

## 2018-06-09 NOTE — Progress Notes (Signed)
Reviewed SW/CM Assistant notes dated: 06-04-18 and 06-05-18  Approved content of these notes on this date.

## 2018-06-09 NOTE — Progress Notes (Signed)
Sitting in wheel chair in room. Reports pain level to rt leg at scale 5. drsg to rt thigh and rt groin intact. Noted swelling to limb. Discussed with patient elevating leg when in bed.reviewed availability for PRN pain medications.

## 2018-06-09 NOTE — Progress Notes (Signed)
[facility]  Physical Therapy  Progress Note      Patient Name: [name], [name]         MRN:   [id number]    Department:  REHABILITATION UNIT        Room:  3307/3307-A  Date:   06/09/2018    Medical Diagnosis     Encounter for other specified aftercare [Z51.89]  S/P ORIF (open reduction internal fixation) fracture [Z96.7, Z87.81]    Patient Active Problem List   Diagnosis   . Osteoarthritis of right hip   . Femur fracture, right   . CAD (coronary artery disease)   . AF (atrial fibrillation)   . DM (diabetes mellitus)   . HTN (hypertension)   . Physical debility       Past Medical/Surgical History     Past Medical History:   Diagnosis Date   . Atrial flutter    . CAD (coronary artery disease)    . Carpal tunnel syndrome     H/O   . DM2 (diabetes mellitus, type 2)    . ED (erectile dysfunction)    . Encounter for blood transfusion    . Hypertension    . MI (myocardial infarction) '08, '11   . OA (osteoarthritis)    . Psoriasis       Past Surgical History:   Procedure Laterality Date   . ARTHROPLASTY, HIP, TOTAL, ANTERIOR APPROACH, C ARM Right 05/19/2018    Procedure: ARTHROPLASTY, HIP, TOTAL, ANTERIOR APPROACH, C ARM;  Surgeon: Len Childs, MD;  Location: [facility];  Service: Orthopedics;  Laterality: Right;  RIGHT TOTAL HIP REPLACEMENT, ASI    . ARTHROPLASTY, HIP, TOTAL, REVISION, ANTERIOR APPROACH, C ARM Right 05/29/2018    Procedure: ARTHROPLASTY, HIP, TOTAL, REVISION, ANTERIOR APPROACH, C ARM;  Surgeon: Len Childs, MD;  Location: [facility];  Service: Orthopedics;  Laterality: Right;  RT REVISION PROSTETIC TOTAL HIP    . BICEPS TENDON REPAIR Right    . CARDIOVERSION     . CORONARY ARTERY BYPASS GRAFT  2011   . CYST REMOVAL      RIGHT CHEEK   . INGUINAL HERNIA REPAIR Right     AS CHILD   . KNEE ARTHROTOMY Left 1975   . ORIF, RADIUS, DISTAL (WRIST) Right    . 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;Reacher;Single point cane;Front wheel walker  (shower chair, raised toilet seat)   Home Living Arrangements   Living Arrangements Alone  (sister and her boyfriend available in the evenings)   Type of Home Apartment  (1st floor)   Home Layout One level;Performs ADL's on one level   Bathroom Accessibility Accessible via wheelchair;Accessible via walker   Home Living Comments handicap accessible    Subjective   Subjective Statement "I can tell improvements in standing and some  improvements with the pain and swelling."   Patient Goal "I would like to work on bathroom, showering, shaving, and getting home."   Therapy Vital Signs   Blood Pressure 142/82   Heart Rate 93   O2 (RA)   Cognition   Behavior calm;cooperative   Following Commands Follows all commands and directions without difficulty   Neuro Status   Coordination intact   Hand Dominance right handed   Pain Assessment   Pain Assessment Numeric Scale (0-10)   Pain Score 4   Pain Location Hip;Leg;Knee   Pain Orientation Right   Pain Descriptors Aching;Discomfort   Pain Frequency Increases with movement   Effect of Pain on Daily Activities mild   Pain Intervention(s) Medication (See eMAR)   Skin Integrity   Skin Integrity RLE edema and bruising around surgical site.    Sensory    Light touch inconsistent results below the ankles bilaterally   5.07 Monofilament  0/5 bilaterally    Proprioception 5/5 Great toe find bilaterally   Gross ROM   Right Upper  Extremity ROM WFL   Left Upper Extremity ROM WFL   R Knee Flexion 0-140 30   Left Lower Extremity ROM WFL   PROM RLE (degrees)   R Hip ABduction 0-45 14   Gross Strength   L Hip Flexion 4+/5   L Knee Flexion 5/5   L Knee Extension 5/5   L Ankle Dorsiflexion 5/5   Functional Mobility   Roll to Left UTA   Roll to Right UTA   Lying to Sitting Minimal Assist   Sitting to Lying Minimal Assist   Seated Lateral Scooting Minimal Assist   Sit to Stand Minimal Assist   Stand to Sit Minimal Assist   Functional Mobility Comments Patient requires assistance moving RLE in and out of bed.    Transfer Bed/Wheelchair/Chair - Ambulatory   Device/Technique Stand Pivot;FWW   Bed Transfer Score  2-Patient requires both lifting assistance to stand and lowering assistance to sit, but completes remainder of transfer to and from bed, chair, or wheelchair themselves;2a-Patient requires assistance to manage trunk and one leg in and/or out of bed   Goal score (Ambulatory) transfers 6-Mod I   Transfer Bed/Wheelchair/Chair - Wheelchair   Device/Technique Stand pivot;FWW   Bed Transfer Score  2a-Patient requires assistance to manage trunk and one leg in and/or out of bed;2-Patient requires both lifting assistance to stand and lowering assistance to sit, but completes remainder of transfer to and from bed, chair, or wheelchair themselves   Bed Transfer Goal Score 6-Mod I   Other Transfers   Floor transfer  (not performed)   Car transfer  (not performed)   Balance   Sitting - Static Good   Sitting - Dynamic Fair   Standing - Static Fair   Standing - Dynamic Fair   Balance Comments Patient is able to alternate UE support while standing stand for 20 seconds. Decreased lateral sway is noticed with only L UE support without LOB. Tolerance to standing balance continues to be challenged by increased fatigue and pain with activity.   Locomotion Ambulation   Walking Distance 1- equals less than 90ft   Goal Walking Distance  2- equals 50-159ft   Walking  Score 1-Patient ambulates less than 50 feet   Walking Goal Score  6-Mod I   Ambulation Level of Assist/Device Minimal Assist;with front-wheeled walker   Ambulation Distance (Feet) 17 Feet   Gait Speed (M/Sec) (unable to assess at this time due to seated  rest breaks)   Gait Pattern 3 point   Ambulation Comments Patient needs verbal cues throughout walking to be reminded of NWB status.   Training and development officer 3 - greater than or equal to 150 ft   Goal Wheelchair Distance  3 - greater than or equal to 150 ft   Wheelchair Score 6-Patient propels wheelchair a minimum of 150 feet independently, safely   Wheelchair Goal Score 7-Independent   Wheelchair Parts Management Patient locks (R) brakes;Patient locks (L) brakes;Patient lifts (L) foot rest;Helper lifts (R) foot rest;Helper removes (R) arm rest;Helper removes (L) arm rest   Locomotion Other   Curb Not performed   Ramp Not performed   Stair Management    Stairs score 0-Activity did not occur upon evaluation   Patient Education   Patient Education Patient was educated on progress made so far in therapy and goals for physical therapy going forward.    Assessment   Strengths independent PLOF;active participant;owns equipment;good UE control;good UE strength;vision intact   Limitations impaired activity tolerance;impaired sensation;impaired LB strength;impaired standing balance;pain limiting activity   Prognosis Good;With continued PT status post acute discharge   Barriers to discharge uncertain how often help will be available at home   Progress Progressing toward goals   Assessment Mr. Levene is consistently participating in therapy twice a day. He has currently met 1/4 STGs and 0/4 LTGs. The patient is motivated to work with therapy, however progress can be delayed at times due to pain in the R LE that increases with movement. He has made progress with mobility, balance,  and strength as evident by the following information. The patient is now able to consistently perform sit <> stand transfers with consistent Min A, which has improved from requiring Mod A with increased fatigue last week. He is now able to ambulate 17 feet with a FWW, Min A and verbal cues for maintaining NWB status, which has improved from 5 feet with a FWW and Min A last week. He is now able to stand for 20 seconds and perform alternating UE activities without LOB or lateral sway, which has improved from being unable to alternate upper extremities and demonstrating lateral sway while standing last week. Patient will continue to benefit from skilled physical therapy during admission to address the above listed impairments and allow for a safe d/c home.       Goals   Previous Short Term Goals To be completed by 06/11/18; Patient will be able to complete supine <> sit with supervision. NOT MET 2) Patient will be able to complete a SPT with consistent Min A and FWW. MET 3) Patient will be able to ambulate 25 feet with Min A and FWW. NOT MET 4) Patient will be able to stand with alternating UE support for 30 seconds to demonstrate ability to perform dynamic standing ALDs. PARTIALLY MET.   New Short Term Goals To be completed by 06/17/18; Patient will be able to complete all aspects of bed mobility with supervision 2) Patient will be able to SPT with supervision and FWW 3) Patient will be able to ambulate 25 feet with Min A and FWW. 4) Patient will be able to stand with alternating UE support for 30 seconds to demonstrate ability to perform dynamic standing ALDs.   Long Term Goals To be completed by d/c; Patient will be able to complete all aspects of bed mobility with Mod I. 2) Patient  will be able to perform SPT with FWW and Mod I. 3) Patient will be able to ambulate 50 feet with FWW and Mod I. 4) Patient will be able to complete a car transfer with FWW and supervision to allow for safe d/c home.    Plan   Frequency 5-6  days per week, twice a day, 1.5 hours a day   Treatment/Interventions Exercise;Gait training;Neuromuscular re-education;Functional transfer training;LE strengthening/ROM;Endurance training;Equipment eval/education;Bed mobility   Discharge Recommendation Home with home health PT   Discharge Recommendation Home with home health PT   Estimated Length of Stay (Days) 8   Discharge Date (TBS)     Philip Argentieri, SPT  ______________________________________________________________________    I have reviewed and agree with the documentation authored by my student of the evaluation/ treatment/education/care plan delivered during my direct supervision.     Co-Signed by:  Macario Carls, PT  ______________________________________________________________________

## 2018-06-09 NOTE — Plan of Care (Addendum)
NURSE NOTE SUMMARY     Patient Name: [name]   Attending Physician: Georgiana Spinner, *   Primary Care Physician: Md, Out Of Network (Inactive)   Date of Admission:   06/03/2018   Today's date:   06/10/2018 LOS: 7 days   Shift Summary:                                                              Medicated through night for pain to rt leg. Provided ice packs to limb. HS gluc. 148, no lantus given per parameters. Patient slept off and on during the night. No acute distress.   Provider Notifications:      Rapid Response Notifications:  Mobility:              Weight tracking:  Family Dynamic:     Last 3 Weights for the past 72 hrs (Last 3 readings):   Weight   06/10/18 0459 139.5 kg (307 lb 8.7 oz)   06/09/18 0600 142 kg (313 lb 0.9 oz)   06/08/18 0520 142.2 kg (313 lb 7.9 oz)       Recent Vitals:  Active Problems:     BP 139/71   Pulse 82   Temp 98.1 F (36.7 C) (Oral)   Resp 18   Ht 1.93 m (6\' 4" )   Wt 139.5 kg (307 lb 8.7 oz)   SpO2 96%   BMI 37.44 kg/m          Active Problems:    Physical debility                       Problem: Pain interferes with ability to perform ADL  Goal: Pain at adequate level as identified by patient  Outcome: Progressing  Apply ice to rt leg. Elevate rt limb. Assist with postioning for comfort.

## 2018-06-09 NOTE — Progress Notes (Signed)
[facility]  [facility]   PROGRESS NOTE      Patient: [name]  Date: 06/09/2018   LOS: 6 Days  Admission Date: 06/03/2018   MRN: [id number]  Attending: Georgiana Spinner, MD     ASSESSMENT/PLAN     [name] is a 62 y.o. male with  Active Hospital Problems    Diagnosis   . Physical debility      Past Medical History:   Diagnosis Date   . Atrial flutter    . CAD (coronary artery disease)    . Carpal tunnel syndrome     H/O   . DM2 (diabetes mellitus, type 2)    . ED (erectile dysfunction)    . Encounter for blood transfusion    . Hypertension    . MI (myocardial infarction) '08, '11   . OA (osteoarthritis)    . Psoriasis        [name] is a 62 y.o. male with PMH significant for CAD, s/p bypass, aflutter on Eliquis, DM2 on oral meds, htn, OA     [facility]. Pt presentedwith right hip pain and right knee pain after falling. 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 Ivar Bury. He was exercising/doing physical therapy and fell. He landed on his right knee and then on his right hip. Xray's revealed periprosthetic femur fracture. Pt was seen by Dr Emeline Darling and underwent ORIF (05/29/18). Pt has post op anemia and required 2 units of prbc. Pt had mild hyponatremia as well felt likely to be SIADH, he was treated with salt and fluid restriction with improvement.       wrc stay ( 06/03/18-)  # physical debility after fall and periprosthetic right hip fracture s/p ORIF -Pt/ot/pmr following. F/u with Dr Emeline Darling on July 16.    Pain meds as per pmr   RLE NWB     # wound cellulitis - keflex initiated on 06/06/18 evening-still erythema, AA reached out to DR [facility] office, we are trying to get sooner appt for patient , they will call us back in the next 2 hours       #  DM2 - lantus, metformin  , accucheck with ssi coverage , glucotrol, januvia-stable     #  Post op blood loss anemia - s/p 2 units after surgery and  had 2 units day of surgery. stable    #  Post op SIADH -fluid restriction      #  Hx of afib/flutter, cad, htn - cont coreg, eliquis, norvasc, cozaar   Increase coreg a little bit  for better BP control    #  Constipation proph - cont bowel regimen.  Last bm 7/7.     # le edema- on lasix for couple days, order ted hose , try to keep legs elevated           DVT/VTE Prophylaxis:  Eliquis    Follow up:  Dr Emeline Darling July 16    D/c date: 06/17/18 as per SW    Care Plan discussed with nursing, consultants, case manager when possible      SUBJECTIVE     Therapy Current Functional Levels:   Overall ADL Level of Assist: Max Assist  Toilet Transfer Level of Assist: Max Assist  Tub/Shower Level of Assist: Not Attempted  Ambulation Distance: (P) 0 feet  Ambulation Level of Assist: (P) Not Attempted  Bed/Chair/Wheelchair Level of Assist: (P) Min Assist  No chest pain, no sob, no gu/gi complaints, still 4/10 pain at surgical site , still erythema, s/s drainage per nursing   MEDICATIONS     Current Facility-Administered Medications   Medication Dose Route Frequency   . acetaminophen  650 mg Oral Q4H   . amLODIPine  10 mg Oral Daily   . apixaban  5 mg Oral Q12H   . bisacodyl  5 mg Oral Daily   . carvedilol  6.25 mg Oral BID Meals   . cephALEXin  500 mg Oral QID   . Digestive Enzymes  3 capsule Oral BID Meals   . furosemide  40 mg Oral Daily   . glipiZIDE  10 mg Oral BID AC   . insulin glargine  5 Units Subcutaneous QHS   . insulin lispro  2-16 Units Subcutaneous TID AC    And   . insulin lispro  1-9 Units Subcutaneous QHS   . lactobacillus species  50 Billion CFU Oral Daily   . losartan  50 mg Oral Daily   . metFORMIN  1,000 mg Oral BID Meals   . polyethylene glycol  17 g Oral BID   . potassium chloride  10 mEq Oral Daily   . SITagliptin  50 mg Oral BID       ROS     Remainder of 10 point ROS as above or otherwise negative    PHYSICAL EXAM     Vitals:    06/09/18 0818   BP:    Pulse: 68   Resp:    Temp:    SpO2:         Temperature: Temp  Min: 98.5 F (36.9 C)  Max: 100.4 F (38 C)  Pulse: Pulse  Min: 68  Max: 99  Respiratory: Resp  Min: 18  Max: 18  Non-Invasive BP: BP  Min: 143/74  Max: 152/88  Pulse Oximetry SpO2  Min: 98 %  Max: 98 %    Intake and Output Summary (Last 24 hours) at Date Time    Intake/Output Summary (Last 24 hours) at 06/09/18 1018  Last data filed at 06/09/18 0900   Gross per 24 hour   Intake              840 ml   Output              430 ml   Net              410 ml      Wt Readings from Last 3 Encounters:   06/09/18 142 kg (313 lb 0.9 oz)   05/26/18 135.5 kg (298 lb 11.6 oz)   05/19/18 136.1 kg (300 lb 0.7 oz)         GEN APPEARANCE: NAD;    HEENT: Conjunctiva Clear; MM  ZOX:WRUE controlled   LUNGS: CTAB; No Wheezes; No Rhonchi: No rales  ABD: Soft; Non-tender, + Normoactive BS  EXT: BLR edema, RLE edema worst vs LLE edema   SKIN: right upper thigh incision site with erythema, lower incision site looks ok         NEURO: No gross Focal neurological deficits      LABS       Recent Labs  Lab 06/09/18  0530 06/06/18  0537 06/03/18  0538   WBC 10.2 10.8 9.8   RBC 2.59* 2.52* 2.61*   Hemoglobin 8.5* 8.6* 8.9*   Hematocrit 26.2* 25.7* 26.5*   MCV 101* 102* 102*   PLT CT 480* 436  419         Recent Labs  Lab 06/09/18  0530 06/07/18  0400 06/06/18  0537 06/02/18  1032   Sodium 131* 132* 131* 135*   Potassium 4.5 4.3 4.3 4.7   Chloride 98 98 98 102   CO2 26 25.2 26 24.1   BUN 16 18 16 17    Creatinine 0.67* 0.77* 0.73* 0.82   Glucose 124* 122* 146* 178*   Calcium 9.3 9.0 8.7 9.2             Invalid input(s): OSMOL                      (!) 172 (The above 1 analytes were performed by [facility] [phone]) [address],[place] [address] )    Microbiology, reviewed and significant for:  No results for input(s): MICRO in the last 24 hours.      RADIOLOGY     Radiological Procedure reviewed.    XR Chest AP Portable   Final Result      No acute airspace disease. Stable minimal vascular prominence  but no congestive failure.      No acute airspace disease. No pneumothorax or pleural effusions.      Stable cardiomegaly. Previous CABG and sternotomy. DJD bilateral shoulders.      ReadingStation:[email or web address]          Signed,  Georgiana Spinner, MD  Internal Medicine/Geriatrics  10:18 AM 06/09/2018

## 2018-06-10 DIAGNOSIS — M9701XA Periprosthetic fracture around internal prosthetic right hip joint, initial encounter: Secondary | ICD-10-CM

## 2018-06-10 DIAGNOSIS — Z9889 Other specified postprocedural states: Secondary | ICD-10-CM

## 2018-06-10 LAB — VH DEXTROSE STICK GLUCOSE
Glucose POCT: 149 mg/dL — ABNORMAL HIGH (ref 71–99)
Glucose POCT: 133 mg/dL — ABNORMAL HIGH (ref 71–99)
Glucose POCT: 144 mg/dL — ABNORMAL HIGH (ref 71–99)
Glucose POCT: 155 mg/dL — ABNORMAL HIGH (ref 71–99)

## 2018-06-10 MED ORDER — OXYCODONE HCL 5 MG PO TABS
10.00 mg | ORAL_TABLET | ORAL | Status: DC | PRN
Start: 2018-06-10 — End: 2018-06-17
  Administered 2018-06-10 – 2018-06-17 (×29): 10 mg via ORAL
  Filled 2018-06-10 (×29): qty 2

## 2018-06-10 MED ORDER — TIZANIDINE HCL 4 MG PO TABS
4.00 mg | ORAL_TABLET | Freq: Two times a day (BID) | ORAL | Status: DC | PRN
Start: 2018-06-10 — End: 2018-06-17
  Filled 2018-06-10: qty 1

## 2018-06-10 MED ORDER — OXYCODONE HCL ER 10 MG PO T12A
10.00 mg | EXTENDED_RELEASE_TABLET | Freq: Two times a day (BID) | ORAL | Status: DC | PRN
Start: 2018-06-10 — End: 2018-06-10

## 2018-06-10 MED ORDER — TIZANIDINE HCL 4 MG PO TABS
8.00 mg | ORAL_TABLET | Freq: Every evening | ORAL | Status: DC
Start: 2018-06-10 — End: 2018-06-10

## 2018-06-10 MED ORDER — OXYCODONE HCL 5 MG PO TABS
5.00 mg | ORAL_TABLET | ORAL | Status: DC | PRN
Start: 2018-06-10 — End: 2018-06-17
  Administered 2018-06-11 – 2018-06-15 (×4): 5 mg via ORAL
  Filled 2018-06-10 (×4): qty 1

## 2018-06-10 MED ORDER — TIZANIDINE HCL 4 MG PO TABS
4.00 mg | ORAL_TABLET | Freq: Every evening | ORAL | Status: DC
Start: 2018-06-10 — End: 2018-06-10

## 2018-06-10 NOTE — Progress Notes (Signed)
Requested pain medication for pain to rt leg, scale reported at 8. Oxycodone 10 mg tabs given. Ice packs to limb placed. Rt leg remains elevated on pillow.

## 2018-06-10 NOTE — Plan of Care (Signed)
Problem: Physical Therapy  Goal: By discharge, patient will perform mobility at the patient's highest functional potential. See PT evaluation/note for goals.  Outcome: Progressing

## 2018-06-10 NOTE — Progress Notes (Addendum)
PHYSICAL MEDICINE AND REHABILITATION PROGRESS NOTE    Date: 06/10/18 Time: 11:57 AM  Patient Name: [name], [name]    CC:  On consult for functional and rehabilitative needs; general pain management; and assessment of medical complexities and co-morbidities as barriers to rehabilitation.      SUBJECTIVE     Pt continues to have pain with movement. Located in the hamstrings and adductors. Has tried Zanaflex but notes drop in BP when taken with Oxycodone.   Pt agreed to altering pain meds in the AM.     Late afternoon, pt reported that he would like to switch back to the prn Oxycodone and Zanaflex as he had the timing figured out. He declined the longer acting Oxycontin. Did report that the stretching during therapy helped with muscle pain.     Therapy Current Functional Levels:   Overall ADL Level of Assist: Max Assist  Toilet Transfer Level of Assist: Max Assist  Tub/Shower Level of Assist: Not Attempted  Ambulation Distance: (P) 5   Ambulation Level of Assist: (P) Min Assist  Bed/Chair/Wheelchair Level of Assist: (P) Min Assist       REVIEW OF SYSTEM     Constitutional:  Denies fever/chills/unexplained weight changes  HEENT:  Denies headache, vision changes  CV:        Denies chest pain/palpitations  Pulm:    Denies shortness of breath  GI:      Denies nausea/vomitting/abd pain; Regular bowel patterns  GU:    Denies bloody/burning urination/trouble controlling bladder  Neuro:   Denies weakness;  Denies paresthesia  Musculoskeletal:   Denies new falls/injuries  Psych: mood euthymic    7/9    MEDICATIONS   SCHEDULED MEDICATIONS  Current Facility-Administered Medications   Medication Dose Route Frequency   . acetaminophen  650 mg Oral Q4H   . amLODIPine  10 mg Oral Daily   . apixaban  5 mg Oral Q12H   . bisacodyl  5 mg Oral Daily   . carvedilol  9.375 mg Oral BID Meals   . cephALEXin  500 mg Oral QID   . Digestive Enzymes  3 capsule Oral BID Meals   . glipiZIDE  10 mg Oral BID [name]   . insulin glargine  5 Units  Subcutaneous QHS   . insulin lispro  2-16 Units Subcutaneous TID [name]    And   . insulin lispro  1-9 Units Subcutaneous QHS   . lactobacillus species  50 Billion CFU Oral Daily   . losartan  50 mg Oral Daily   . metFORMIN  1,000 mg Oral BID Meals   . polyethylene glycol  17 g Oral BID   . SITagliptin  50 mg Oral BID       AS NEEDED MEDICATIONS  acetaminophen, diphenhydrAMINE, diphenhydrAMINE, gabapentin, NSG Communication: Glucose POCT order ([name], HS) **AND** insulin lispro **AND** insulin lispro **AND** insulin lispro, naloxone, ondansetron **OR** ondansetron, ondansetron, oxyCODONE, oxyCODONE, promethazine **OR** promethazine **OR** promethazine, tiZANidine, traMADol    PHYSICAL EXAMINATION   VITAL SIGNS  Vitals:    06/10/18 0851   BP: 131/84   Pulse:    Resp:    Temp:    SpO2:      7/9    EXAMINATION  GENERAL:                  Alert and oriented X 3. NAD, Pleasant and cooperative.  Seen by bedside.   LUNGS:  Clear to auscultation, no wheezing, no rhonchi  HEART:                       Irregular  Rhythm without murmur, gallops, or rubs  ABDOMEN:      Soft, nontender, bowel sounds present. No organomegaly. No rebound tenderness  EXTREMITIES:           No pain, cyanosis, or edema. Neg Homan's on left.  Significant RLE edema, hip to toes.  Right anterior groin dressing with serous drainage proximally (clear yellow serous, less erythema surrounding staples).    Lower lateral incision c/d/i.   NEUROLOGIC:           Alert, oriented, non-focal findings  SKIN:                           Intact  MOTOR:                      Functional strength throughout left.  RLE limited by pain.  Dorsiflexion/plantarflexion 5/5.   Taut hamstrings, adductors.   SENS:                         Intact bilat to PP/LT, proprioception    OBJECTIVE DATA   Laboratory  Results     Procedure Component Value Units Date/Time    Dextrose Stick Glucose [[id number]]  (Abnormal) Collected:  06/10/18 0727    Specimen:  Blood Updated:   06/10/18 0744     Glucose, POCT 149 (H) mg/dL     Dextrose Stick Glucose [[id number]]  (Abnormal) Collected:  06/09/18 2146    Specimen:  Blood Updated:  06/09/18 2213     Glucose, POCT 148 (H) mg/dL     Dextrose Stick Glucose [[id number]]  (Abnormal) Collected:  06/09/18 1638    Specimen:  Blood Updated:  06/09/18 1655     Glucose, POCT 133 (H) mg/dL           Micro  Microbiology Results     None            Radiology  XR Chest AP Portable   Final Result      No acute airspace disease. Stable minimal vascular prominence but no congestive failure.      No acute airspace disease. No pneumothorax or pleural effusions.      Stable cardiomegaly. Previous CABG and sternotomy. DJD bilateral shoulders.      ReadingStation:[id number]            ASSESSMENT   SUMMARY STATEMENT:  The patient states that he came to [facility] for an elective right total hip replacement.  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  He was recovering well from the hip replacement when he had a fall landing on the right knee and suffered a periprosthetic hip fracture.  Patient underwent surgical repair and is currently nonweightbearing.  He does state that knee pain and generalized pain are somewhat limiting.  Last bowel movement was yesterday.  Post op required 4 Units PRBC and developed hyponatremia.      Diagnosis:     Active Hospital Problems    Diagnosis   . Physical debility   1.  S/p right periprosthetic hip fracture after THR.  NWB.              -  f/u Dr. Emeline Darling 7/16 at 2pm                gait / Impaired mobility, ADLs / Fall risks / Debility - OT /PT, Fall precautions    Continue with current rehabilitative and medical management plan as discussed above.   Patient is participating in therapy, working towards goals. Refer to therapy notes in EHR for details of current progress.    Coordination of care:   Rehabilitation goal setting and  medical management plan discussed with therapy, nursing, and hospitalist team in AM meetings  Medication list and pertinent lab work reviewed.   Medical management as per hospitalist  Patient to continue acute rehabilitation.     March ARB Goal: 7/16 prior to ortho f/u, HH PT/OT/RN    2.  DVT risks - eliquis  3.  Pain - Initially started on long acting and scheduled Zanaflex qhs. Pt declined. Switched back to Roxi 5-10mg  q3 prn, Zanaflex prn.  IM d/c'd neurontin.  Last used Tramadol 7/6.    4.   PVRs, bowel program 7/8  5.  DM-per IM  6.  Afib/flutter - on eliquis  7.  HTN/CAD-coreg, norvasc  8.  SIADH - improving, off fluid restrictions, resume fluid restrictions and lasix per IM  9.  Post op anemia - monitor  10.  Wound care, dressing changes q shift. On Keflex. Instructed to with abx and current care by Dr. Ellyn Hack office on 7/8.     PLAN   Patient is participating in therapy, working towards goals.   Refer to therapy notes in EHR for details of current progress.  Patient to continue acute rehabilitation  Continue with current medical management plan as per Internal Medicine hospitalist attending team.

## 2018-06-10 NOTE — Progress Notes (Addendum)
[facility]  [facility]   PROGRESS NOTE      Patient: [name]  Date: 06/10/2018   LOS: 7 Days  Admission Date: 06/03/2018   MRN: [id number]  Attending: Georgiana Spinner, MD     ASSESSMENT/PLAN     [name] is a 62 y.o. male with  Active Hospital Problems    Diagnosis   . Physical debility      Past Medical History:   Diagnosis Date   . Atrial flutter    . CAD (coronary artery disease)    . Carpal tunnel syndrome     H/O   . DM2 (diabetes mellitus, type 2)    . ED (erectile dysfunction)    . Encounter for blood transfusion    . Hypertension    . MI (myocardial infarction) '08, '11   . OA (osteoarthritis)    . Psoriasis        [name] is a 62 y.o. male with PMH significant for CAD, s/p bypass, aflutter on Eliquis, DM2 on oral meds, htn, OA     [facility]. Pt presentedwith right hip pain and right knee pain after falling. 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 Ivar Bury. He was exercising/doing physical therapy and fell. He landed on his right knee and then on his right hip. Xray's revealed periprosthetic femur fracture. Pt was seen by Dr Emeline Darling and underwent ORIF (05/29/18). Pt has post op anemia and required 2 units of prbc. Pt had mild hyponatremia as well felt likely to be SIADH, he was treated with salt and fluid restriction with improvement.       wrc stay ( 06/03/18-)  # physical debility after fall and periprosthetic right hip fracture s/p ORIF -Pt/ot/pmr following. F/u with Dr Emeline Darling on July 16.    Pain meds as per pmr   RLE NWB     # wound cellulitis - keflex initiated on 06/06/18 evening-  06/09/18:still erythema,Dr.Gore's office called back , charge nurse talked to nurse. She stated that they looked at the epic photos of his wound and they do not wish to see the patient at this time (stating that the photos look improved from 7/3). They stated to continue to monitor the wound and to  call back and schedule an appointment to be seen if it worsens (more redness, swelling, higher fever etc...) or if there is no improvement in a few days. Nurse stated to also continue keflex.       #  DM2 - lantus, metformin  , accucheck with ssi coverage , glucotrol, januvia-stable     #  Post op blood loss anemia - s/p 2 units after surgery and had 2 units day of surgery. stable    #  Post op SIADH -fluid restriction      #  Hx of afib/flutter, cad, htn - cont coreg, eliquis, norvasc, cozaar   Increased  coreg a little bit  for better BP control ( yesterday )     #  Constipation proph - cont bowel regimen.  Last bm 7/8.     # le edema- s/p lasix for couple days, order ted hose , try to keep legs elevated           DVT/VTE Prophylaxis:  Eliquis    Follow up:  Dr Emeline Darling July 16    D/c date: 06/17/18 as per SW    Care Plan discussed with nursing,  consultants, case manager when possible    Cbc, bmp in am   SUBJECTIVE     Therapy Current Functional Levels:   Overall ADL Level of Assist: Max Assist  Toilet Transfer Level of Assist: Max Assist  Tub/Shower Level of Assist: Not Attempted  Ambulation Distance: (P) 5 feet  Ambulation Level of Assist: (P) Min Assist  Bed/Chair/Wheelchair Level of Assist: (P) Min Assist     No chest pain, no sob, no gu/gi complaints, per patient, surgical wound looked better today when nursing was changing the dressing   MEDICATIONS     Current Facility-Administered Medications   Medication Dose Route Frequency   . acetaminophen  650 mg Oral Q4H   . amLODIPine  10 mg Oral Daily   . apixaban  5 mg Oral Q12H   . bisacodyl  5 mg Oral Daily   . carvedilol  9.375 mg Oral BID Meals   . cephALEXin  500 mg Oral QID   . Digestive Enzymes  3 capsule Oral BID Meals   . glipiZIDE  10 mg Oral BID AC   . insulin glargine  5 Units Subcutaneous QHS   . insulin lispro  2-16 Units Subcutaneous TID AC    And   . insulin lispro  1-9 Units Subcutaneous QHS   . lactobacillus species  50 Billion CFU Oral Daily    . losartan  50 mg Oral Daily   . metFORMIN  1,000 mg Oral BID Meals   . polyethylene glycol  17 g Oral BID   . SITagliptin  50 mg Oral BID   . tiZANidine  4 mg Oral QHS       ROS     Remainder of 10 point ROS as above or otherwise negative    PHYSICAL EXAM     Vitals:    06/10/18 0851   BP: 131/84   Pulse:    Resp:    Temp:    SpO2:        Temperature: Temp  Min: 98.1 F (36.7 C)  Max: 100 F (37.8 C)  Pulse: Pulse  Min: 70  Max: 103  Respiratory: Resp  Min: 16  Max: 20  Non-Invasive BP: BP  Min: 128/74  Max: 146/72  Pulse Oximetry SpO2  Min: 96 %  Max: 97 %    Intake and Output Summary (Last 24 hours) at Date Time    Intake/Output Summary (Last 24 hours) at 06/10/18 1220  Last data filed at 06/10/18 0900   Gross per 24 hour   Intake             1440 ml   Output             2900 ml   Net            -1460 ml      Wt Readings from Last 3 Encounters:   06/10/18 139.5 kg (307 lb 8.7 oz)   05/26/18 135.5 kg (298 lb 11.6 oz)   05/19/18 136.1 kg (300 lb 0.7 oz)         GEN APPEARANCE: NAD;    HEENT: Conjunctiva Clear; MM  NGE:XBMW controlled   LUNGS: CTAB; No Wheezes; No Rhonchi: No rales  ABD: Soft; Non-tender, + Normoactive BS  EXT: BLR edema, RLE edema worst vs LLE edema   SKIN: right upper thigh incision site with erythema, lower incision site looks ok   06/09/18:        NEURO: No gross Focal neurological deficits  LABS       Recent Labs  Lab 06/09/18  0530 06/06/18  0537   WBC 10.2 10.8   RBC 2.59* 2.52*   Hemoglobin 8.5* 8.6*   Hematocrit 26.2* 25.7*   MCV 101* 102*   PLT CT 480* 436         Recent Labs  Lab 06/09/18  0530 06/07/18  0400 06/06/18  0537   Sodium 131* 132* 131*   Potassium 4.5 4.3 4.3   Chloride 98 98 98   CO2 26 25.2 26   BUN 16 18 16    Creatinine 0.67* 0.77* 0.73*   Glucose 124* 122* 146*   Calcium 9.3 9.0 8.7             Invalid input(s): OSMOL                      (!) 133 (The above 1 analytes were performed by [facility] [phone]) [address],[place] [address] )     Microbiology, reviewed and significant for:  No results for input(s): MICRO in the last 24 hours.      RADIOLOGY     Radiological Procedure reviewed.    XR Chest AP Portable   Final Result      No acute airspace disease. Stable minimal vascular prominence but no congestive failure.      No acute airspace disease. No pneumothorax or pleural effusions.      Stable cardiomegaly. Previous CABG and sternotomy. DJD bilateral shoulders.      ReadingStation:[id number]          Signed,  Georgiana Spinner, MD  Internal Medicine/Geriatrics  12:20 PM 06/10/2018

## 2018-06-10 NOTE — Progress Notes (Signed)
[facility]  Occupational Therapy  Progress Note    Patient Name: JAIDEEP, POLLACK         MRN:   [id number]    Department:  REHABILITATION UNIT        Room:  3307/3307-A  Date:   06/10/2018    Medical Diagnosis     Encounter for other specified aftercare [Z51.89]  S/P ORIF (open reduction internal fixation) fracture [Z96.7, Z87.81]    Patient Active Problem List   Diagnosis   . Osteoarthritis of right hip   . Femur fracture, right   . CAD (coronary artery disease)   . AF (atrial fibrillation)   . DM (diabetes mellitus)   . HTN (hypertension)   . Physical debility       Past Medical/Surgical History     Past Medical History:   Diagnosis Date   . Atrial flutter    . CAD (coronary artery disease)    . Carpal tunnel syndrome     H/O   . DM2 (diabetes mellitus, type 2)    . ED (erectile dysfunction)    . Encounter for blood transfusion    . Hypertension    . MI (myocardial infarction) '08, '11   . OA (osteoarthritis)    . Psoriasis       Past Surgical History:   Procedure Laterality Date   . ARTHROPLASTY, HIP, TOTAL, ANTERIOR APPROACH, C ARM Right 05/19/2018    Procedure: ARTHROPLASTY, HIP, TOTAL, ANTERIOR APPROACH, C ARM;  Surgeon: Len Childs, MD;  Location: [facility];  Service: Orthopedics;  Laterality: Right;  RIGHT TOTAL HIP REPLACEMENT, ASI    . ARTHROPLASTY, HIP, TOTAL, REVISION, ANTERIOR APPROACH, C ARM Right 05/29/2018    Procedure: ARTHROPLASTY, HIP, TOTAL, REVISION, ANTERIOR APPROACH, C ARM;  Surgeon: Len Childs, MD;  Location: [facility];  Service: Orthopedics;  Laterality: Right;  RT REVISION PROSTETIC TOTAL HIP    . BICEPS TENDON REPAIR Right    . CARDIOVERSION     . CORONARY ARTERY BYPASS GRAFT  2011   . CYST REMOVAL      RIGHT CHEEK   . INGUINAL HERNIA REPAIR Right     AS CHILD   . KNEE ARTHROTOMY Left 1975   . ORIF, RADIUS, DISTAL (WRIST) Right    . SINUS SURGERY         Flow Sheet Data          06/09/18 1200   Documentation type:    Type  Progress   Rehab Therapy Precautions   Weight Bearing Status RLE non weight bearing   Total Hip Replacement no external rotation  (hyperextension)   Other Precautions [place]   Subjective   Subjective Statement "I didn't use my reacher and sock aide today- I was feeling tired so I had the CNA do it for me"   Patient Goal "Get well as fast as possible"   Prior Level of Function   Prior level of function Independent with ADLs;Ambulates independently   Baseline Activity Level Community ambulation   Work Real Games developer for self   Driving independent   Home Living Arrangements   Living Arrangements Alone   Type of Home Apartment   Home Layout One level   Type of  Shower/Tub Walk-in shower   Type of  Toilet Raised   Bathroom Accessibility Accessible   DME Currently at Home Reacher;Sock Aid   Home Living - Notes / Comments pt reports  he has a friend who lives locally if needs a place to stay before going back to [place]. Pt reports he could stay in one-level basement with Systems analyst Transfer TBE   Home Management    Home Management Comments sister helps with house cleaning and laundry weekly   Cognition   Behavior calm cooperative;attentive   Cognition Comments pt is alert, oriented x3   Comprehension   Comprehension Type  Both   Devices/Techniques Glasses   Comprehension Score  7-Patient understands complex and abstract conversations   Goal Score Comprehension 7-Independent   Expression    Expression Type Both   Expression Score 7-Patient expresses complex and abstract ideas clearly independently   Goal Score Expression 7-Independent   Problem solving    Problem Solving Score 7b-Patient solves complex problems to complete tasks   Goal Score Problem Solving 7-Independent   Memory   Memory Score 7-Patient recognizes familiar people/things   Goal Score Memory 7-Independent   Neuro Status   Motor Planning intact   Coordination intact   Hand Dominance right handed   Motor control    RUE WFL   RUE Finger to Nose Test  [facility]   RUE Finger Opposition  WFL   RUE Grip Strength  WFL   LUE WFL   LUE Finger to Nose Test  [facility]   LUE Finger Opposition  WFL   LUE Grip Strength  WFL   Sensory   Proprioception WFL   Vision - Complex Assessment   Ocular Range of Motion WFL   Head Position WDL   Tracking Able to track stimulus in all quads without difficulty   Glasses yes   Acuity Able to read clock/calendar on wall without difficulty;Able to read employee name badge without difficulty;Able to read normal print without difficulty   Skin Integrity   Skin Integrity RLE edema and bruising. Redness and discharge around surgical stitches. LLE is swollen, pt reports it was swollen prior to surgery   Edema    Edema scale +2 Deeper pit less than 5mm   Edema Comments RLE edema   Pain Assessment   Pain Assessment Numeric Scale (0-10)   Pain Score 4   POSS Score 1   Pain Location Groin   Pain Orientation Anterior   Pain Descriptors Aching   Pain Frequency Continuous   Effect of Pain on Daily Activities moderate   Patient's Stated Comfort Functional Goal 1   Pain Intervention(s) Cold pack   Multiple Pain Sites Yes   Pain Comments pt reports hip pain is 8/10 and hip is constant pain throughout the day and affects his daily activities. Pt reports groin pain is 4/10 and is painful after transfers.   Pain 2   Pain Score 2 8   POSS Score 2 1   Pain Location 2 Hip   Pain Orientation 2 Right   Pain Descriptors 2 Aching   Pain Frequency 2 Continuous   Pain Onset 2 On-going   Patient's Stated Comfort Functional Goal 2 0   Pain Intervention(s) 2 Cold pack   Inspection/Posture   Inspection/Posture WFL   Gross ROM   Gross ROM WFL   Neck/Trunk ROM WFL   Right Upper Extremity ROM WFL   Left Upper Extremity ROM WFL   Tone   Tone WFL   Gross Strength   Neck/Trunk Strength 5/5   Right Upper Extremity Strength 5/5   Left Upper Extremity Strength 5/5   Eating   Eating Device/Technique  Regular untensil   Eating Score 7-Patient completes  entire meal independently without any devices or modified diets   Goal Eating Score 7-Independent   Grooming    Hands Patient   Face Patient   Hair Patient   Oral Patient   Shave/Makeup  Helper   Grooming Score 7-Patient completes all desired grooming tasks independently without any devices   Goal Score Grooming  7   Bathing    Chest Patient   Right Arm Patient   Left Arm  Patient   Abdomen Patient   Front Perineal Area Patient   Back Perineal Areas (includes buttocks) Helper   Left Upper Leg Patient   Right Upper Leg Patient   Left Lower Leg (include foot) Helper   Right Lower Leg (include foot) Helper   Device/Techniques Seated at sink;Standing at sink   Bathing Score  3-Patient completes 50-74% of desired bathing tasks   Goal Score Bathing  6-Mod I   Dressing- Upper Body   Shirt 1 Right Arm  Patient   Shirt 1 Left Arm  Patient   Shirt 1 Overhead Patient   Shirt 1 Arrange in Back Patient   Dressing Score 7-Patient completes all desired upper body dressing tasks independently without any devices  (w/c level)   Goal Dressing Score  7-Independent   Dressing- Lower Body   Underwear Right Leg Helper   Underwear Left Leg Helper   Underwear Arrange Over Hips Helper   Pants Right Leg Helper   Pants Left Leg Helper   Pants Arrange Over Hips Helper   Right Sock Helper   Left Sock Helper   Left Shoe Patient   Device/Technique  Seated at sink;Standing at sink;Long handled shoe horn   Dressing Score  1a-Patient requires assistance of two or more people for completion of desired lower body dressing tasks   Goal Score Dressing  6-Mod1   Toileting    Pants Down Patient   Hygiene Helper   Pants Up  Patient   Device/Technique Safety frame (balance);Grab bar (balance);Walker (balance)   Toileting Score  3-Patient completes 50-74% of toileting tasks   Goal Score Toileting 6-Mod1   Toilet Transfers - Ambulatory   Ambulatory Device/Technique Toilet Transfer FWW   Toilet Transfer Score  0-Activity did not occur upon evaluation   Goal  Score Toilet Transfer 6-Mod1   Toilet Transfers - Wheelchair   Approaches Bed or Chair Helper   Locks Brakes Helper   Lifts Foot Rests Helper   Device/Techniques-Wheelchair FWW   Toilet Transfer Score  3-Patient requires only lifting assistance to stand, but then completes transfer themselves   Goal Score Toilet Transfer  6-Mod I   Tub/Shower Transfer- Ambulatory   Tub/Shower Transfer Score  0-Activity did not occur upon evaluation   Tub/Shower Transfers - Designer, jewellery Score  0 - Activity did not occur upon evaluation   Balance   Static Sitting Balance Independent   Dyanamic Sitting Balance Independent   Static Standing Balance Moderate Assist   Dynamic Standing Balance Moderate Assist   Balance Comments pt continues to require max-mod A to stand and transfer w/c < > bed and w/c < > toilet using FWW. Pt needs constant verbal cues to keep weight off RLE while standing and transfering.   Leisure/Routine Participation   Leisure Interest target shooting   Patient Education   Patient Education importance of NWB status; DME (reacher, shoe horn, sock aid); pain management   Assessment   Strengths independent PLOF;active participant;good family  support;cognition intact;owns equipment;good UE control;good UE strength;good sitting balance;vision intact;sensation intact   Limitations impaired standing balance;impaired LB strength;pain limiting activity   Prognosis Good for OT Goals   Assessment Comment Pt did not meet short term goals this week. Pt is limited by decreased standing endurance, RLE NWB status, RLE edema and pain. Pt reports he understands how to use AE when performing LB dressing and demonstrates use during therapy activity. However, pt has not carried over AE practices in his room due to reported pain and fatigue. It is anticipated pt will use AE more often this week to perform LB dressing at min A level as pt continues to get stronger, pain decreases and therapy will continue to focus on  self care. Pt requires Total A with LB dressing, Max A with LB bathing, and transfering w/c < > bed and w/c < > toilet. Pt requires Mod A for toileting due to hygiene assistance. Pt requires constant cuing during transfer to main NWB on RLE during transfers. Pt will be returning home alone to a one-level apartment in [place], however verbalized that he has a place to stay in [place] if need be. Pt reports the place in [place] is one-level in the basement and has stairs to access. Pt will need to be able to safely access the home and have w/c mobility throughout, following NWB status, perform functional mobility to perform bathing and dressing prior to returning home. Pt is expected to benefit from OT services to facilitate improved functional performance with self care, transfers, light home mgmt toward returning home in [place].   OT Goals   Previous Short Term Goals (1 week) 1) Pt to complete toilet transfer with Min A. 2) Pt to complete LB dressing with Mod A. 3) Pt to complete toileting activity with Min A. = NOT MET   New Short Term Goals 1) Pt to complete toilet transfer with Min A. 2) Pt to complete LB dressing with Min A using AE PRN. 3) Pt to complete toileting activity with Min A.    Long Term Goals By d/c pt to be at mod I level with self care, functional transfers, light home management with AE AT DME prn.   OT Plan   OT Frequency  2-3 X/day, 1.5 hours/day, 5-6 days/week   Treatment/Interventions ADL Training;Improving Balance/Tolerance;DME Training;Patient/Family Education;Techniques to Improve Skin/Joint Integrity;Techniques for Energy Building surveyor;Improving UB Strength/Endurance;Positioning   Discharge Recommendation Home with no needs   Estimated Length of Stay (Days) 7       Rosendo Gros, OTR/L

## 2018-06-10 NOTE — Plan of Care (Signed)
Problem: Occupational Therapy  Goal: By discharge, patient will perform self-care at the patient's highest functional potential.  See OT evaluation/note for goals.  Outcome: Progressing

## 2018-06-10 NOTE — Plan of Care (Signed)
Problem: Compromised Tissue integrity  Goal: Damaged tissue is healing and protected  Outcome: Progressing   06/10/18 1504   Goal/Interventions addressed this shift   Damaged tissue is healing and protected  Monitor/assess Braden scale every shift;Provide wound care per wound care algorithm;Reposition patient every 2 hours and as needed unless able to reposition self;Increase activity as tolerated/progressive mobility;Relieve pressure to bony prominences for patients at moderate and high risk;Avoid shearing injuries;Keep intact skin clean and dry;Use bath wipes, not soap and water, for daily bathing       Comments: Dressing was changed on right hip anterior aspect and lateral aspect surgical sites. Small serosanguinous drainage on anterior aspect surgical site noted. Patient was given oxycodone before morning and afternoon therapy sessions for severe right hip pain

## 2018-06-10 NOTE — Plan of Care (Signed)
Problem: Compromised Tissue integrity  Goal: Damaged tissue is healing and protected  Outcome: Progressing   06/10/18 1504   Goal/Interventions addressed this shift   Damaged tissue is healing and protected  Monitor/assess Braden scale every shift;Provide wound care per wound care algorithm;Reposition patient every 2 hours and as needed unless able to reposition self;Increase activity as tolerated/progressive mobility;Relieve pressure to bony prominences for patients at moderate and high risk;Avoid shearing injuries;Keep intact skin clean and dry;Use bath wipes, not soap and water, for daily bathing     NURSE NOTE SUMMARY     Patient Name: [name]   Attending Physician: Georgiana Spinner, *   Primary Care Physician: Md, Out Of Network (Inactive)   Date of Admission:   06/03/2018   Today's date:   06/10/2018 LOS: 7 days   Shift Summary:                                                              Patient medicated for pain PRN. Right leg anterior groin incision appears pink, swollen with drainage. Dressing changed an new picture added to chart. FS at HS was 155 received 1 unit SSI and 5 units of lantus. Call bell with in reach and bed alarm set.   Provider Notifications:      Rapid Response Notifications:  Mobility:              Weight tracking:  Family Dynamic:     Last 3 Weights for the past 72 hrs (Last 3 readings):   Weight   06/10/18 0459 139.5 kg (307 lb 8.7 oz)   06/09/18 0600 142 kg (313 lb 0.9 oz)   06/08/18 0520 142.2 kg (313 lb 7.9 oz)       Recent Vitals:  Active Problems:     BP 126/84   Pulse 86   Temp 99 F (37.2 C) (Oral)   Resp 20   Ht 1.93 m (6\' 4" )   Wt 139.5 kg (307 lb 8.7 oz)   SpO2 98%   BMI 37.44 kg/m          Active Problems:    Physical debility

## 2018-06-11 LAB — BASIC METABOLIC PANEL
Anion Gap: 12 mMol/L (ref 7.0–18.0)
BUN / Creatinine Ratio: 27.1 Ratio (ref 10.0–30.0)
BUN: 19 mg/dL (ref 7–22)
CO2: 25 mMol/L (ref 20–30)
Calcium: 9.2 mg/dL (ref 8.5–10.5)
Chloride: 95 mMol/L — ABNORMAL LOW (ref 98–110)
Creatinine: 0.7 mg/dL — ABNORMAL LOW (ref 0.80–1.30)
EGFR: 102 mL/min/{1.73_m2} (ref 60–150)
Glucose: 104 mg/dL — ABNORMAL HIGH (ref 71–99)
Osmolality Calc: 260 mOsm/kg — ABNORMAL LOW (ref 275–300)
Potassium: 4 mMol/L (ref 3.5–5.3)
Sodium: 128 mMol/L — ABNORMAL LOW (ref 136–147)

## 2018-06-11 LAB — CBC AND DIFFERENTIAL
Basophils %: 0.4 % (ref 0.0–3.0)
Basophils Absolute: 0 10*3/uL (ref 0.0–0.3)
Eosinophils %: 0.4 % (ref 0.0–7.0)
Eosinophils Absolute: 0 10*3/uL (ref 0.0–0.8)
Hematocrit: 26.2 % — ABNORMAL LOW (ref 39.0–52.5)
Hemoglobin: 8.7 gm/dL — ABNORMAL LOW (ref 13.0–17.5)
Lymphocytes Absolute: 1.2 10*3/uL (ref 0.6–5.1)
Lymphocytes: 15.2 % (ref 15.0–46.0)
MCH: 33 pg (ref 28–35)
MCHC: 33 gm/dL (ref 32–36)
MCV: 100 fL (ref 80–100)
MPV: 7 fL (ref 6.0–10.0)
Monocytes Absolute: 1.1 10*3/uL (ref 0.1–1.7)
Monocytes: 13.1 % (ref 3.0–15.0)
Neutrophils %: 71 % (ref 42.0–78.0)
Neutrophils Absolute: 5.8 10*3/uL (ref 1.7–8.6)
PLT CT: 463 10*3/uL — ABNORMAL HIGH (ref 130–440)
RBC: 2.61 10*6/uL — ABNORMAL LOW (ref 4.00–5.70)
RDW: 17 % — ABNORMAL HIGH (ref 11.0–14.0)
WBC: 8.1 10*3/uL (ref 4.0–11.0)

## 2018-06-11 LAB — VH DEXTROSE STICK GLUCOSE
Glucose POCT: 120 mg/dL — ABNORMAL HIGH (ref 71–99)
Glucose POCT: 132 mg/dL — ABNORMAL HIGH (ref 71–99)
Glucose POCT: 134 mg/dL — ABNORMAL HIGH (ref 71–99)
Glucose POCT: 136 mg/dL — ABNORMAL HIGH (ref 71–99)
Glucose POCT: 170 mg/dL — ABNORMAL HIGH (ref 71–99)

## 2018-06-11 MED ORDER — SODIUM CHLORIDE 1 G PO TABS
1.00 g | ORAL_TABLET | Freq: Three times a day (TID) | ORAL | Status: DC
Start: 2018-06-11 — End: 2018-06-12
  Administered 2018-06-11 – 2018-06-12 (×3): 1 g via ORAL
  Filled 2018-06-11 (×3): qty 1

## 2018-06-11 NOTE — Progress Notes (Signed)
[facility]  [facility]   PROGRESS NOTE      Patient: [name]  Date: 06/11/2018   LOS: 8 Days  Admission Date: 06/03/2018   MRN: [id number]  Attending: Georgiana Spinner, MD     ASSESSMENT/PLAN     [name] is a 62 y.o. male with  Active Hospital Problems    Diagnosis   . Physical debility      Past Medical History:   Diagnosis Date   . Atrial flutter    . CAD (coronary artery disease)    . Carpal tunnel syndrome     H/O   . DM2 (diabetes mellitus, type 2)    . ED (erectile dysfunction)    . Encounter for blood transfusion    . Hypertension    . MI (myocardial infarction) '08, '11   . OA (osteoarthritis)    . Psoriasis        [name] is a 62 y.o. male with PMH significant for CAD, s/p bypass, aflutter on Eliquis, DM2 on oral meds, htn, OA     [facility]. Pt presentedwith right hip pain and right knee pain after falling. 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 Ivar Bury. He was exercising/doing physical therapy and fell. He landed on his right knee and then on his right hip. Xray's revealed periprosthetic femur fracture. Pt was seen by Dr Emeline Darling and underwent ORIF (05/29/18). Pt has post op anemia and required 2 units of prbc. Pt had mild hyponatremia as well felt likely to be SIADH, he was treated with salt and fluid restriction with improvement.       wrc stay ( 06/03/18-)  # physical debility after fall and periprosthetic right hip fracture s/p ORIF -Pt/ot/pmr following. F/u with Dr Emeline Darling on July 16.    Pain meds as per pmr   RLE NWB     # wound cellulitis - keflex initiated on 06/06/18 evening-  06/09/18:still erythema,Dr.Gore's office called back , charge nurse talked to nurse. She stated that they looked at the epic photos of his wound and they do not wish to see the patient at this time (stating that the photos look improved from 7/3). They stated to continue to monitor the wound and  to call back and schedule an appointment to be seen if it worsens (more redness, swelling, higher fever etc...) or if there is no improvement in a few days. Nurse stated to also continue keflex.   7/10- charge nurse to call dr Emeline Darling office to look at pictures that I took today       #  DM2 - lantus, metformin  , accucheck with ssi coverage , glucotrol, januvia-stable     #  Post op blood loss anemia - s/p 2 units after surgery and had 2 units day of surgery. stable    #  Post op SIADH -fluid restriction  , change it to 1200 cc , add salt tab , bmp in am     #  Hx of afib/flutter, cad, htn - cont coreg, eliquis, norvasc, cozaar   Increased  coreg a little bit  for better BP control ( yesterday )     #  Constipation proph - cont bowel regimen.  Last bm 7/9     # le edema- s/p lasix for couple days, ordered  ted hose , try to keep legs elevated  DVT/VTE Prophylaxis:  Eliquis    Follow up:  Dr Emeline Darling July 16    D/c date: 06/17/18 as per SW    Care Plan discussed with nursing, consultants, case manager when possible    SUBJECTIVE     Therapy Current Functional Levels:   Overall ADL Level of Assist: Mod Assist  Toilet Transfer Level of Assist: Mod Assist  Tub/Shower Level of Assist: Not Attempted  Ambulation Distance: 0 feet  Ambulation Level of Assist: Not Attempted  Bed/Chair/Wheelchair Level of Assist: Min Assist     No chest pain, no sob, no gu/gi complaints, min pain tenderness at surgical site   MEDICATIONS     Current Facility-Administered Medications   Medication Dose Route Frequency   . acetaminophen  650 mg Oral Q4H   . amLODIPine  10 mg Oral Daily   . apixaban  5 mg Oral Q12H   . bisacodyl  5 mg Oral Daily   . carvedilol  9.375 mg Oral BID Meals   . cephALEXin  500 mg Oral QID   . Digestive Enzymes  3 capsule Oral BID Meals   . glipiZIDE  10 mg Oral BID AC   . insulin glargine  5 Units Subcutaneous QHS   . insulin lispro  2-16 Units Subcutaneous TID AC    And   . insulin lispro  1-9 Units  Subcutaneous QHS   . lactobacillus species  50 Billion CFU Oral Daily   . losartan  50 mg Oral Daily   . metFORMIN  1,000 mg Oral BID Meals   . polyethylene glycol  17 g Oral BID   . SITagliptin  50 mg Oral BID   . sodium chloride  1 g Oral TID MEALS       ROS     Remainder of 10 point ROS as above or otherwise negative    PHYSICAL EXAM     Vitals:    06/11/18 0740   BP: 149/80   Pulse: 99   Resp:    Temp:    SpO2:        Temperature: Temp  Min: 98.4 F (36.9 C)  Max: 99 F (37.2 C)  Pulse: Pulse  Min: 82  Max: 103  Respiratory: Resp  Min: 18  Max: 20  Non-Invasive BP: BP  Min: 126/84  Max: 149/80  Pulse Oximetry SpO2  Min: 95 %  Max: 98 %    Intake and Output Summary (Last 24 hours) at Date Time    Intake/Output Summary (Last 24 hours) at 06/11/18 1045  Last data filed at 06/11/18 0800   Gross per 24 hour   Intake             1640 ml   Output             1325 ml   Net              315 ml      Wt Readings from Last 3 Encounters:   06/11/18 136.6 kg (301 lb 2.4 oz)   05/26/18 135.5 kg (298 lb 11.6 oz)   05/19/18 136.1 kg (300 lb 0.7 oz)         GEN APPEARANCE: NAD;    HEENT: Conjunctiva Clear; MM  PPI:RJJO controlled   LUNGS: CTAB; No Wheezes; No Rhonchi: No rales  ABD: Soft; Non-tender, + Normoactive BS  EXT: BLR edema, RLE edema worst vs LLE edema   SKIN: right upper thigh incision site with erythema, lower incision  site looks ok   06/11/18:                06/09/18:        NEURO: No gross Focal neurological deficits      LABS       Recent Labs  Lab 06/11/18  0515 06/09/18  0530 06/06/18  0537   WBC 8.1 10.2 10.8   RBC 2.61* 2.59* 2.52*   Hemoglobin 8.7* 8.5* 8.6*   Hematocrit 26.2* 26.2* 25.7*   MCV 100 101* 102*   PLT CT 463* 480* 436         Recent Labs  Lab 06/11/18  0515 06/09/18  0530 06/07/18  0400 06/06/18  0537   Sodium 128* 131* 132* 131*   Potassium 4.0 4.5 4.3 4.3   Chloride 95* 98 98 98   CO2 25 26 25.2 26   BUN 19 16 18 16    Creatinine 0.70* 0.67* 0.77* 0.73*   Glucose 104* 124* 122* 146*   Calcium 9.2  9.3 9.0 8.7             Invalid input(s): OSMOL                      (!) 132 (The above 1 analytes were performed by [facility] [phone]) [address],[place], [address] )    Microbiology, reviewed and significant for:  No results for input(s): MICRO in the last 24 hours.      RADIOLOGY     Radiological Procedure reviewed.    XR Chest AP Portable   Final Result      No acute airspace disease. Stable minimal vascular prominence but no congestive failure.      No acute airspace disease. No pneumothorax or pleural effusions.      Stable cardiomegaly. Previous CABG and sternotomy. DJD bilateral shoulders.      ReadingStation:[id number]          Signed,  Georgiana Spinner, MD  Internal Medicine/Geriatrics  10:45 AM 06/11/2018

## 2018-06-11 NOTE — Progress Notes (Signed)
Right upper thigh

## 2018-06-11 NOTE — Progress Notes (Signed)
Received call back from [facility] regarding patient's incision.  Per [facility] "incision looks okay", keep dressing clean and dry.  Appointment changed from 7/16 to 7/12 at 10:15.  Physician notified of recommendations from orthopedic office.

## 2018-06-11 NOTE — Patient Care Conference (Signed)
PHYSICAL MEDICINE AND REHABILITATION   INTERDISCIPLINARY   TEAM CONFERENCE NOTE    Patient Name:  [name], [name]        MRN:   [id number]    Room:   3307/3307-A  Department:    REHABILITATION UNIT     Date of Team Conference: 06/12/2018    Time of Team Conference: 10:06      TEAM MEMBERS PRESENT     Case Manager / Social Work: Marlowe Aschoff, SW  Nursing: Francie Massing, RN   Nursing: Edwin Dada, RN   Physiatrist: Yevette Edwards, MD  Physical Therapy: Luellen Pucker, PT  Occupational Therapy: Rosendo Gros, OT  Speech Language Pathologist: Ananias Pilgrim, SLP    MEDICAL     Extensive discussion re: patient's ongoing medical management issues, as well as medical and rehabilitative functional progress, plan of care, and discharge planning.    Active Hospital Problems    Diagnosis   . Physical debility     None    Current Facility-Administered Medications   Medication Dose Route Frequency   . acetaminophen  650 mg Oral Q4H   . amLODIPine  10 mg Oral Daily   . apixaban  5 mg Oral Q12H   . bisacodyl  5 mg Oral Daily   . carvedilol  9.375 mg Oral BID Meals   . cephALEXin  500 mg Oral QID   . Digestive Enzymes  3 capsule Oral BID Meals   . glipiZIDE  10 mg Oral BID AC   . insulin glargine  5 Units Subcutaneous QHS   . insulin lispro  2-16 Units Subcutaneous TID AC    And   . insulin lispro  1-9 Units Subcutaneous QHS   . lactobacillus species  50 Billion CFU Oral Daily   . losartan  50 mg Oral Daily   . metFORMIN  1,000 mg Oral BID Meals   . polyethylene glycol  17 g Oral BID   . SITagliptin  50 mg Oral BID   . sodium chloride  1 g Oral TID MEALS     acetaminophen, diphenhydrAMINE, diphenhydrAMINE, gabapentin, NSG Communication: Glucose POCT order (AC, HS) **AND** insulin lispro **AND** insulin lispro **AND** insulin lispro, naloxone, ondansetron **OR** ondansetron, ondansetron, oxyCODONE, oxyCODONE, promethazine **OR** promethazine **OR** promethazine, tiZANidine, traMADol    NURSING     Pain: Pain uncontrolled with  medication  Medical staff adjusting pain medication  DVT prophylaxis: Apixaban / Eliquis  BLADDER FUNCTION  Bladder management:5 Supervision   BOWEL FUNCTION  Bowel management:6 Modified Independent   SKIN: incisions in groin red to be checked by Dr. Emeline Darling on 7/12  DIABETES MELLITUS Patient has established history of diabetes Diabetic management ongoing - refer to medical records for details    PHYSICAL THERAPY     Bed/Chair/Wheelchair Transfer: 4 Minimal Assistance     Wheelchair Mobility: 6 Modified Independent   Distance: 150 feet at Modified Independent  level    Ambulation: 1 Total Assistance / Dependence   Assistive Device: FWW  Distance: 17 feet at Minimal Assistance  level    Stairs: Not assessed / Inability to participate      OCCUPATIONAL THERAPY     Eating: 7 Independent   Grooming: 7 Independent   Bathing: 3 Moderate Assist  Dressing Upper Body: 6 Modified Independent   Dressing Lower Body: 3 Moderate Assist  Toileting: 3 Moderate Assist  Toilet Transfer: 3 Moderate Assist  Tub/Shower Transfer: Not assessed / Inability to participate    Comprehension: 6 Modified Independent  Expression: 7 Independent   Problem Solving: 7 Independent   Memory: 7 Independent     OT Recommendations: None currently    SPEECH LANGUAGE PATHOLOGY     Consultation not clinically indicated    NEUROPSYCHOLOGY     NEUROPSYCHOLOGY  Consult not clinically indicated    RESPIRATORY THERAPY     RESPIRATORY CONSULTATION  Not initiated / not clinically indicated    REHABILITATION GOALS     Occupational Therapy Goal: Long Term Goals: By d/c pt to be at mod I level with self care, functional transfers, light home management with AE AT DME prn. [ Progressing towards goal, goal still valid ]    Physical Therapy Goal: Long Term Goals: To be completed by d/c; Patient will be able to complete all aspects of bed mobility with Mod I. 2) Patient will be able to perform SPT with FWW and Mod I. 3) Patient will be able to ambulate 50 feet with FWW  and Mod I. 4) Patient will be able to complete a car transfer with FWW and supervision to allow for safe d/c home.  [ Progressing towards goal, goal still valid ]    Nursing Goal: Patient will learn to manage home medications and decrease pain medications  [ Progressing towards goal, goal still valid ]    PROGRESS TOWARDS GOALS     Is the patient making progress towards goals? Yes    VALIDITY OF TREATMENT PLAN     Treatment plan still valid? Yes    BARRIERS TO REHABILITATION     BARRIERS TO REHABILITATIVE GOALS  Fall risks with high potentials for recurrences  Weight bearing status restrictions  Cardiovascular risks / Cardiopulmonary risks / Vascular risks  Deficits such as visual / hearing impairments / limb or joint deficiencies affecting rehabilitaiton  BARRIERS TO DISCHARGE  Unavailability of home and family support  Weight bearing status restrictions  Home accessibility / Home safety  Complex medical management     CASE MANAGEMENT / SOCIAL SERVICE / DISCHARGE PLAN     Anticipated date of discharge:  06/20/2018    DISCHARGE DISPOSITION     Home with Home Health Nursing, with Home Health Occupational Therapy and with Home Health Physical Therapy    EQUIPMENT RECOMMENDED     Occupational Therapy: To be determined pending patient progress    Physical Therapy: Front wheel walker and Wheelchair - manual  9  REHABILITATION FOLLOW-UP INSTRUCTIONS     Physical Medicine & Rehabilitation service outpatient follow-up as per clinical indication, to be determined at time of discharge  Primary Care Physician  Orthopedist   Prescriptions for medications and follow-up testing as per Hospitalist Team    RATIONAL FOR NEED OF FURTHER INTENSIVE REHAB     Patient continues to express a willingness to participate and demonstrates reasonable potential for functional improvement from intensive and comprehensive inpatient Rehabilitation under the direction of Physical Medicine and Rehab as well as medical management under the  supervision of Internal Medicine hospitalist team. Continue acute rehabilitation.     Physician's electronic signature attests the presence of above team members during medical and functional discussions during this weekly team conference meeting as stated above.    10 min spent in the coordination of care for this patient with the medical and rehab team

## 2018-06-11 NOTE — Plan of Care (Signed)
Problem: Pain interferes with ability to perform ADL  Goal: Pain at adequate level as identified by patient  Outcome: Progressing   06/11/18 1943   Goal/Interventions addressed this shift   Pain at adequate level as identified by patient Assess for risk of opioid induced respiratory depression, including snoring/sleep apnea. Alert healthcare team of risk factors identified.;Assess pain on admission, during daily assessment and/or before any "as needed" intervention(s);Reassess pain within 30-60 minutes of any procedure/intervention, per Pain Assessment, Intervention, Reassessment (AIR) Cycle;Evaluate patient's satisfaction with pain management progress;Evaluate if patient comfort function goal is met;Offer non-pharmacological pain management interventions     NURSE NOTE SUMMARY     Patient Name: [name]   Attending Physician: Georgiana Spinner, *   Primary Care Physician: Md, Out Of Network (Inactive)   Date of Admission:   06/03/2018   Today's date:   06/11/2018 LOS: 8 days   Shift Summary:                                                              Pt was compliant with medication this shift, participated in therapy, and remained free from falls this shift.    Pt's main goal was to cut back on pain medication while still participating in therapy.  Pt took only one tab of 5mg  oxycodone twice in the morning but then went back to 10mg  dose as the day progressed.  Pt stated that he wants to continue to work on getting through therapy with just the 5 mg dose.    Changed Pt's groin dressing twice today due to serosanguinous drainage.  Skin that is stapled and surrounding skin is red and appears to be irritated.  When bandage changed in the evening the area was an angry red and had some white material around edges of staples.  Pt's follow up appointment with surgeon moved up to Friday.  Pt's doctor is aware that wound is red and irritated.     Provider Notifications:      Rapid Response Notifications:   Mobility:              Weight tracking:  Family Dynamic:     Last 3 Weights for the past 72 hrs (Last 3 readings):   Weight   06/11/18 0450 136.6 kg (301 lb 2.4 oz)   06/10/18 0459 139.5 kg (307 lb 8.7 oz)   06/09/18 0600 142 kg (313 lb 0.9 oz)       Recent Vitals:  Active Problems:     BP 153/72   Pulse 90   Temp 97.2 F (36.2 C) (Oral)   Resp 16   Ht 1.93 m (6\' 4" )   Wt 136.6 kg (301 lb 2.4 oz)   SpO2 94%   BMI 36.66 kg/m          Active Problems:    Physical debility

## 2018-06-11 NOTE — Plan of Care (Addendum)
NURSE NOTE SUMMARY     Patient Name: [name]   Attending Physician: Georgiana Spinner, *   Primary Care Physician: Md, Out Of Network (Inactive)   Date of Admission:   06/03/2018   Today's date:   06/12/2018 LOS: 9 days   Shift Summary:                                                              Sleeping quietly without complaints. Rt hip and leg dressing dry and intact.     Provider Notifications:      Rapid Response Notifications:  Mobility:              Weight tracking:  Family Dynamic:     Last 3 Weights for the past 72 hrs (Last 3 readings):   Weight   06/11/18 0450 136.6 kg (301 lb 2.4 oz)   06/10/18 0459 139.5 kg (307 lb 8.7 oz)   06/09/18 0600 142 kg (313 lb 0.9 oz)       Recent Vitals:  Active Problems:     BP 146/72   Pulse 98   Temp 97.7 F (36.5 C) (Oral)   Resp 16   Ht 1.93 m (6\' 4" )   Wt 136.6 kg (301 lb 2.4 oz)   SpO2 97%   BMI 36.66 kg/m          Active Problems:    Physical debility                   Problem: Moderate/High Fall Risk Score >5  Goal: Patient will remain free of falls  Outcome: Progressing   06/11/18 2100   High Risk Falls Interventions (Greater than 13)   VH High Risk (Greater than 13) ALL REQUIRED LOW INTERVENTIONS;ALL REQUIRED MODERATE INTERVENTIONS;RED "HIGH FALL RISK" SIGNAGE;BED ALARM WILL BE ACTIVATED WHEN THE PATEINT IS IN BED WITH SIGNAGE "RESET BED ALARM";A CHAIR PAD ALARM WILL BE USED WHEN PATIENT IS UP SITTING IN A CHAIR;PATIENT IS TO BE SUPERVISED FOR ALL TOILETING ACTIVITIES;A safety companion may be used when deemed appropriate by the Primary RN and Clinical Administrator;Keep door open for better visibility;Use assistive devices

## 2018-06-12 DIAGNOSIS — M9701XD Periprosthetic fracture around internal prosthetic right hip joint, subsequent encounter: Secondary | ICD-10-CM

## 2018-06-12 LAB — BASIC METABOLIC PANEL
Anion Gap: 9.9 mMol/L (ref 7.0–18.0)
BUN / Creatinine Ratio: 23.9 Ratio (ref 10.0–30.0)
BUN: 16 mg/dL (ref 7–22)
CO2: 28.2 mMol/L (ref 20.0–30.0)
Calcium: 8.9 mg/dL (ref 8.5–10.5)
Chloride: 99 mMol/L (ref 98–110)
Creatinine: 0.67 mg/dL — ABNORMAL LOW (ref 0.80–1.30)
EGFR: 104 mL/min/{1.73_m2} (ref 60–150)
Glucose: 99 mg/dL (ref 71–99)
Osmolality Calc: 268 mOsm/kg — ABNORMAL LOW (ref 275–300)
Potassium: 4.1 mMol/L (ref 3.5–5.3)
Sodium: 133 mMol/L — ABNORMAL LOW (ref 136–147)

## 2018-06-12 LAB — VH DEXTROSE STICK GLUCOSE
Glucose POCT: 111 mg/dL — ABNORMAL HIGH (ref 71–99)
Glucose POCT: 118 mg/dL — ABNORMAL HIGH (ref 71–99)
Glucose POCT: 140 mg/dL — ABNORMAL HIGH (ref 71–99)
Glucose POCT: 140 mg/dL — ABNORMAL HIGH (ref 71–99)
Glucose POCT: 141 mg/dL — ABNORMAL HIGH (ref 71–99)

## 2018-06-12 MED ORDER — ACETAMINOPHEN 325 MG PO TABS
650.00 mg | ORAL_TABLET | Freq: Four times a day (QID) | ORAL | Status: DC
Start: 2018-06-12 — End: 2018-06-20
  Administered 2018-06-12 – 2018-06-20 (×30): 650 mg via ORAL
  Filled 2018-06-12 (×28): qty 2

## 2018-06-12 MED ORDER — CARVEDILOL 6.25 MG PO TABS
12.50 mg | ORAL_TABLET | Freq: Two times a day (BID) | ORAL | Status: DC
Start: 2018-06-12 — End: 2018-06-15
  Administered 2018-06-12 – 2018-06-15 (×6): 12.5 mg via ORAL
  Filled 2018-06-12 (×7): qty 2

## 2018-06-12 NOTE — Progress Notes (Signed)
[facility]  [facility]   PROGRESS NOTE      Patient: [name]  Date: 06/12/2018   LOS: 9 Days  Admission Date: 06/03/2018   MRN: [id number]  Attending: Georgiana Spinner, MD     ASSESSMENT/PLAN     [name] is a 62 y.o. male with  Active Hospital Problems    Diagnosis   . Physical debility      Past Medical History:   Diagnosis Date   . Atrial flutter    . CAD (coronary artery disease)    . Carpal tunnel syndrome     H/O   . DM2 (diabetes mellitus, type 2)    . ED (erectile dysfunction)    . Encounter for blood transfusion    . Hypertension    . MI (myocardial infarction) '08, '11   . OA (osteoarthritis)    . Psoriasis        [name] is a 62 y.o. male with PMH significant for CAD, s/p bypass, aflutter on Eliquis, DM2 on oral meds, htn, OA     [facility] Pt presentedwith right hip pain and right knee pain after falling. 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 Ivar Bury. He was exercising/doing physical therapy and fell. He landed on his right knee and then on his right hip. Xray's revealed periprosthetic femur fracture. Pt was seen by Dr Emeline Darling and underwent ORIF (05/29/18). Pt has post op anemia and required 2 units of prbc. Pt had mild hyponatremia as well felt likely to be SIADH, he was treated with salt and fluid restriction with improvement.       wrc stay ( 06/03/18-)  # physical debility after fall and periprosthetic right hip fracture s/p ORIF -Pt/ot/pmr following. F/u with Dr Emeline Darling on July 16.    Pain meds as per pmr   RLE NWB     # wound cellulitis - keflex initiated on 06/06/18 evening  06/09/18:still erythema,Dr.Gore's office called back , charge nurse talked to nurse. She stated that they looked at the epic photos of his wound and they do not wish to see the patient at this time (stating that the photos look improved from 7/3). They stated to continue to monitor the wound and to  call back and schedule an appointment to be seen if it worsens (more redness, swelling, higher fever etc...) or if there is no improvement in a few days. Nurse stated to also continue keflex.   7/10- charge nurse to call dr Emeline Darling office to look at pictures , they changed f/u appt for 7/12      #  DM2 - lantus, metformin  , accucheck with ssi coverage , glucotrol, januvia-stable     #  Post op blood loss anemia - s/p 2 units after surgery and had 2 units day of surgery. stable    #  Post op SIADH -fluid restriction  , changed it to 1200 cc , improving     #  Hx of afib/flutter, cad, htn - cont coreg, eliquis, norvasc, cozaar   Further increase  coreg a little bit  for better BP control     #  Constipation proph - cont bowel regimen.  Last bm 7/9     # le edema- s/p lasix for couple days, ordered  ted hose , try to keep legs elevated           DVT/VTE Prophylaxis:  Eliquis  F/u appt:  Dr. Cheree Ditto   7/12 @ 10:15                               [phone]    CASE MANAGEMENT / SOCIAL SERVICE / DISCHARGE PLAN     Anticipated date of discharge:  06/20/2018    DISCHARGE DISPOSITION     Home with Home Health Nursing, with Home Health Occupational Therapy and with Home Health Physical Therapy      Care Plan discussed with nursing, consultants, case manager when possible    SUBJECTIVE     Therapy Current Functional Levels:   Overall ADL Level of Assist: Supervision  Toilet Transfer Level of Assist: Mod Assist  Tub/Shower Level of Assist: Not Attempted  Ambulation Distance: 0 feet  Ambulation Level of Assist: Not Attempted  Bed/Chair/Wheelchair Level of Assist: Min Assist     No chest pain, no sob, no gu/gi complaints, no reported issues, dr Emeline Darling will see patient in am   MEDICATIONS     Current Facility-Administered Medications   Medication Dose Route Frequency   . acetaminophen  650 mg Oral Q4H   . amLODIPine  10 mg Oral Daily   . apixaban  5 mg Oral Q12H   . bisacodyl  5 mg Oral Daily   . carvedilol  9.375 mg Oral  BID Meals   . cephALEXin  500 mg Oral QID   . Digestive Enzymes  3 capsule Oral BID Meals   . glipiZIDE  10 mg Oral BID AC   . insulin glargine  5 Units Subcutaneous QHS   . insulin lispro  2-16 Units Subcutaneous TID AC    And   . insulin lispro  1-9 Units Subcutaneous QHS   . lactobacillus species  50 Billion CFU Oral Daily   . losartan  50 mg Oral Daily   . metFORMIN  1,000 mg Oral BID Meals   . polyethylene glycol  17 g Oral BID   . SITagliptin  50 mg Oral BID       ROS     Remainder of 10 point ROS as above or otherwise negative    PHYSICAL EXAM     Vitals:    06/12/18 0903   BP: 148/84   Pulse: (!) 101   Resp:    Temp:    SpO2:        Temperature: Temp  Min: 97.2 F (36.2 C)  Max: 98.4 F (36.9 C)  Pulse: Pulse  Min: 85  Max: 101  Respiratory: Resp  Min: 16  Max: 16  Non-Invasive BP: BP  Min: 132/62  Max: 153/72  Pulse Oximetry SpO2  Min: 94 %  Max: 97 %    Intake and Output Summary (Last 24 hours) at Date Time    Intake/Output Summary (Last 24 hours) at 06/12/18 1109  Last data filed at 06/12/18 0800   Gross per 24 hour   Intake              720 ml   Output              200 ml   Net              520 ml      Wt Readings from Last 3 Encounters:   06/12/18 139.7 kg (307 lb 15.7 oz)   05/26/18 135.5 kg (298 lb 11.6 oz)   05/19/18 136.1 kg (300 lb  0.7 oz)         GEN APPEARANCE: NAD;    HEENT: Conjunctiva Clear; MM  ZOX:WRUE controlled   LUNGS: CTAB; No Wheezes; No Rhonchi: No rales  ABD: Soft; Non-tender, + Normoactive BS  EXT: BLR edema, RLE edema worst vs LLE edema   SKIN: right upper thigh incision site with erythema, lower incision site looks ok   06/12/18:        06/11/18:                06/09/18:        NEURO: No gross Focal neurological deficits      LABS       Recent Labs  Lab 06/11/18  0515 06/09/18  0530 06/06/18  0537   WBC 8.1 10.2 10.8   RBC 2.61* 2.59* 2.52*   Hemoglobin 8.7* 8.5* 8.6*   Hematocrit 26.2* 26.2* 25.7*   MCV 100 101* 102*   PLT CT 463* 480* 436         Recent Labs  Lab 06/12/18  0530  06/11/18  0515 06/09/18  0530 06/07/18  0400 06/06/18  0537   Sodium 133* 128* 131* 132* 131*   Potassium 4.1 4.0 4.5 4.3 4.3   Chloride 99 95* 98 98 98   CO2 28.2 25 26  25.2 26   BUN 16 19 16 18 16    Creatinine 0.67* 0.70* 0.67* 0.77* 0.73*   Glucose 99 104* 124* 122* 146*   Calcium 8.9 9.2 9.3 9.0 8.7             Invalid input(s): OSMOL                      (!) 118 (The above 1 analytes were performed by [facility] [phone]) [address],[place] [address] )    Microbiology, reviewed and significant for:  No results for input(s): MICRO in the last 24 hours.      RADIOLOGY     Radiological Procedure reviewed.    XR Chest AP Portable   Final Result      No acute airspace disease. Stable minimal vascular prominence but no congestive failure.      No acute airspace disease. No pneumothorax or pleural effusions.      Stable cardiomegaly. Previous CABG and sternotomy. DJD bilateral shoulders.      ReadingStation:[id number]          Signed,  Georgiana Spinner, MD  Internal Medicine/Geriatrics  11:09 AM 06/12/2018

## 2018-06-12 NOTE — Progress Notes (Signed)
Date of team conference:  06/12/2018  See patient care conference with the above date.  Reviewed team report with pt. [place] date has been moved to 06/20/18.  Pt will need HH PT/OT/Nsg at [facility].  Pt is in agreement with [place] date and team report. Pt reports that he still has not talked to primary caregiver about living arrangements post rehab.  Pt states that he will do that this evening.  Pt also states that his sister's boyfriend is not working right now and may be able stay with pt at primary caregiver's home to assist pt as needed.  SW will f/u with pt tomorrow and have asked that pt give primary caregiver SW information so that SW can clarify [place] plan and support.  Pt has no expressed [place] issues or concerns at this time.

## 2018-06-12 NOTE — Progress Notes (Signed)
Right upper thigh

## 2018-06-12 NOTE — Plan of Care (Signed)
Problem: Moderate/High Fall Risk Score >5  Goal: Patient will remain free of falls  Outcome: Progressing   06/12/18 0901   High Risk Falls Interventions (Greater than 13)   VH High Risk (Greater than 13) ALL REQUIRED LOW INTERVENTIONS;ALL REQUIRED MODERATE INTERVENTIONS;RED "HIGH FALL RISK" SIGNAGE;BED ALARM WILL BE ACTIVATED WHEN THE PATEINT IS IN BED WITH SIGNAGE "RESET BED ALARM";A CHAIR PAD ALARM WILL BE USED WHEN PATIENT IS UP SITTING IN A CHAIR;PATIENT IS TO BE SUPERVISED FOR ALL TOILETING ACTIVITIES     NURSE NOTE SUMMARY     Patient Name: [name]   Attending Physician: Georgiana Spinner, *   Primary Care Physician: Md, Out Of Network (Inactive)   Date of Admission:   06/03/2018   Today's date:   06/12/2018 LOS: 9 days   Shift Summary:                                                              Pt medication compliant, participated in therapy, and remained free from falls.   Provider Notifications:      Rapid Response Notifications:  Mobility:              Weight tracking:  Family Dynamic:     Last 3 Weights for the past 72 hrs (Last 3 readings):   Weight   06/12/18 0517 139.7 kg (307 lb 15.7 oz)   06/11/18 0450 136.6 kg (301 lb 2.4 oz)   06/10/18 0459 139.5 kg (307 lb 8.7 oz)       Recent Vitals:  Active Problems:     BP 141/71   Pulse 90   Temp 97.7 F (36.5 C) (Oral)   Resp 18   Ht 1.93 m (6\' 4" )   Wt 139.7 kg (307 lb 15.7 oz)   SpO2 98%   BMI 37.49 kg/m          Active Problems:    Physical debility

## 2018-06-12 NOTE — Progress Notes (Signed)
PHYSICAL MEDICINE AND REHABILITATION PROGRESS NOTE    Date: 06/12/18 Time: 11:54 AM  Patient Name: [name], [name]    CC:  On consult for functional and rehabilitative needs; general pain management; and assessment of medical complexities and co-morbidities as barriers to rehabilitation.      SUBJECTIVE   Lengthy discussion held with the patient this morning regarding his pain management.  He has been taking 6 doses of Tylenol per day and 6 doses of oxycodone, we discussed the need to decrease dosage or increase time interval.      Therapy Current Functional Levels:   Overall ADL Level of Assist: Supervision  Toilet Transfer Level of Assist: Mod Assist  Tub/Shower Level of Assist: Not Attempted  Ambulation Distance: 0   Ambulation Level of Assist: Not Attempted  Bed/Chair/Wheelchair Level of Assist: Min Assist         TEAM CONFERENCE:55min spent re: patient coordination of care with medical and rehab teams.   Present: Physiatrist, Case Managers/Social Service, Nursing, OT, PT, SLP, Neuropsychology. Extensive discussion re: medical and rehabilitative progress, plan of care, and discharge planning.   Refer to signed Team Conference notes as well as therapy notes in EHR for details.    TEAM CONFERENCEnote -if pending at the time of this progress note - please refer to EMR.    35 minutes were spent at the bedside involved in medical evaluation/interview/counselling and on the unit with patient care including Team conference 10 minutes with SLP/PT/OT/NSG/SW      RN-Bladder 5, bowel 6  PT-min assist transfers, min assist to walk 17 feet but is not maintaining nonweightbearing status  OT-bathing and dressing with mod assist  REVIEW OF SYSTEM     Constitutional:  Denies fever/chills/unexplained weight changes  HEENT:  Denies headache, vision changes  CV:        Denies chest pain/palpitations  Pulm:    Denies shortness of breath  GI:      Denies nausea/vomitting/abd pain; Regular bowel patterns  GU:    Denies  bloody/burning urination/trouble controlling bladder  Neuro:   Denies weakness;  Denies paresthesia  Musculoskeletal:   Denies new falls/injuries  Psych: mood euthymic      MEDICATIONS   SCHEDULED MEDICATIONS  Current Facility-Administered Medications   Medication Dose Route Frequency   . acetaminophen  650 mg Oral Q4H   . amLODIPine  10 mg Oral Daily   . apixaban  5 mg Oral Q12H   . bisacodyl  5 mg Oral Daily   . carvedilol  12.5 mg Oral BID Meals   . cephALEXin  500 mg Oral QID   . Digestive Enzymes  3 capsule Oral BID Meals   . glipiZIDE  10 mg Oral BID AC   . insulin glargine  5 Units Subcutaneous QHS   . insulin lispro  2-16 Units Subcutaneous TID AC    And   . insulin lispro  1-9 Units Subcutaneous QHS   . lactobacillus species  50 Billion CFU Oral Daily   . losartan  50 mg Oral Daily   . metFORMIN  1,000 mg Oral BID Meals   . polyethylene glycol  17 g Oral BID   . SITagliptin  50 mg Oral BID       AS NEEDED MEDICATIONS  acetaminophen, diphenhydrAMINE, diphenhydrAMINE, gabapentin, NSG Communication: Glucose POCT order (AC, HS) **AND** insulin lispro **AND** insulin lispro **AND** insulin lispro, naloxone, ondansetron **OR** ondansetron, ondansetron, oxyCODONE, oxyCODONE, promethazine **OR** promethazine **OR** promethazine, tiZANidine, traMADol    PHYSICAL EXAMINATION  VITAL SIGNS  Vitals:    06/12/18 0903   BP: 148/84   Pulse: (!) 101   Resp:    Temp:    SpO2:        EXAMINATION  GENERAL:                  Alert and oriented X 3. NAD, Pleasant and cooperative.  Seen in w/c  LUNGS:                       Clear to auscultation, no wheezing, no rhonchi  HEART:                       Irregular  Rhythm without murmur, gallops, or rubs  ABDOMEN:      Soft, nontender, bowel sounds present. No organomegaly. No rebound tenderness  EXTREMITIES:           No pain, cyanosis, or edema. Neg Homan's on left.  Significant RLE edema, hip to toes.  Right anterior groin dressing with serous drainage proximally (scant yellow  serous, less erythema surrounding staples).    Lower lateral incision c/d/i.   NEUROLOGIC:           Alert, oriented, non-focal findings  SKIN:                           Intact  MOTOR:                      Functional strength throughout left.  RLE limited by pain.  Dorsiflexion/plantarflexion 5/5.   Taut hamstrings, adductors. Less edema  SENS:                         Intact bilat to PP/LT, proprioception    OBJECTIVE DATA   Laboratory  Results     Procedure Component Value Units Date/Time    Basic Metabolic Panel [[id number]]  (Abnormal) Collected:  06/12/18 0530    Specimen:  Plasma Updated:  06/12/18 0819     Sodium 133 (L) mMol/L      Potassium 4.1 mMol/L      Chloride 99 mMol/L      CO2 28.2 mMol/L      Calcium 8.9 mg/dL      Glucose 99 mg/dL      Creatinine 4.09 (L) mg/dL      BUN 16 mg/dL      Anion Gap 9.9 mMol/L      BUN/Creatinine Ratio 23.9 Ratio      EGFR 104 mL/min/1.59m2      Osmolality Calc 268 (L) mOsm/kg     Dextrose Stick Glucose [[id number]]  (Abnormal) Collected:  06/12/18 0725    Specimen:  Blood Updated:  06/12/18 0742     Glucose, POCT 118 (H) mg/dL     Dextrose Stick Glucose [[id number]]  (Abnormal) Collected:  06/12/18 0312    Specimen:  Blood Updated:  06/12/18 0329     Glucose, POCT 111 (H) mg/dL     Blood Culture - Venipuncture [[id number]] Collected:  06/06/18 2020    Specimen:  Blood from Venipuncture Updated:  06/12/18 5284    Narrative:       Specimen/Source: Blood/Venipuncture  Collected: 06/06/2018 20:20     Status: Final      Last Updated: 06/12/2018 02:11  Culture Result (Final)      No Growth in 5 Days          Dextrose Stick Glucose [[id number]]  (Abnormal) Collected:  06/11/18 2050    Specimen:  Blood Updated:  06/11/18 2106     Glucose, POCT 170 (H) mg/dL     Dextrose Stick Glucose [[id number]]  (Abnormal) Collected:  06/11/18 1707    Specimen:  Blood Updated:  06/11/18 1723     Glucose, POCT 136 (H) mg/dL     Dextrose Stick Glucose [[id number]]  (Abnormal) Collected:   06/11/18 1148    Specimen:  Blood Updated:  06/11/18 1205     Glucose, POCT 134 (H) mg/dL           Micro  Microbiology Results     None            Radiology  XR Chest AP Portable   Final Result      No acute airspace disease. Stable minimal vascular prominence but no congestive failure.      No acute airspace disease. No pneumothorax or pleural effusions.      Stable cardiomegaly. Previous CABG and sternotomy. DJD bilateral shoulders.      ReadingStation:[id number]            ASSESSMENT   SUMMARY STATEMENT:  The patient states that he came to [facility] for an elective right total hip replacement.  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  He was recovering well from the hip replacement when he had a fall landing on the right knee and suffered a periprosthetic hip fracture.  Patient underwent surgical repair and is currently nonweightbearing.  He does state that knee pain and generalized pain are somewhat limiting.  Last bowel movement was yesterday.  Post op required 4 Units PRBC and developed hyponatremia.      Diagnosis:     Active Hospital Problems    Diagnosis   . Physical debility   1.  S/p right periprosthetic hip fracture after THR.  NWB.              -f/u Dr. Emeline Darling 7/12 due to pain, drainage                gait / Impaired mobility, ADLs / Fall risks / Debility - OT /PT, Fall precautions    Continue with current rehabilitative and medical management plan as discussed above.   Patient is participating in therapy, working towards goals. Refer to therapy notes in EHR for details of current progress.    Coordination of care:   Rehabilitation goal setting and medical management plan discussed with therapy, nursing, and hospitalist team in AM meetings  Medication list and pertinent lab work reviewed.   Medical management as per hospitalist  Patient to continue acute rehabilitation.     Cabin  Goal: 7/19 as  may need to change to sliding board.  Will see , HH PT/OT/RN    2.  DVT risks - eliquis  3.  Pain - Initially started on long acting and scheduled Zanaflex qhs. Pt declined. Switched back to Roxi 5-10mg  q3 prn, Zanaflex prn.  IM d/c'd neurontin.  Last used Tramadol 7/6.  Used 60 mg 7/8, 50 mg 7/9 and 40 mg 7/10.  Will decrease tylenol.    4.   PVRs, bowel program 7/10  5.  DM-per IM  6.  Afib/flutter - on eliquis  7.  HTN/CAD-coreg, norvasc  8.  SIADH - improving, off fluid restrictions, resume fluid restrictions and lasix per IM  9.  Post op anemia - monitor  10.  Wound care, dressing changes q shift. On Keflex. Instructed to start with abx and current care by Dr. Ellyn Hack office on 7/8.     PLAN   Patient is participating in therapy, working towards goals.   Refer to therapy notes in EHR for details of current progress.  Patient to continue acute rehabilitation  Continue with current medical management plan as per Internal Medicine hospitalist attending team.        Eddie Candle, MD  Physical Medicine and Rehabilitation

## 2018-06-12 NOTE — Plan of Care (Signed)
Problem: Physical Therapy  Goal: By discharge, patient will perform mobility at the patient's highest functional potential. See PT evaluation/note for goals.  Outcome: Progressing

## 2018-06-13 LAB — VH DEXTROSE STICK GLUCOSE
Glucose POCT: 145 mg/dL — ABNORMAL HIGH (ref 71–99)
Glucose POCT: 142 mg/dL — ABNORMAL HIGH (ref 71–99)
Glucose POCT: 111 mg/dL — ABNORMAL HIGH (ref 71–99)
Glucose POCT: 141 mg/dL — ABNORMAL HIGH (ref 71–99)
Glucose POCT: 157 mg/dL — ABNORMAL HIGH (ref 71–99)

## 2018-06-13 NOTE — Plan of Care (Addendum)
Problem: Moderate/High Fall Risk Score >5  Goal: Patient will remain free of falls  Outcome: Progressing      Problem: Pain interferes with ability to perform ADL  Goal: Pain at adequate level as identified by patient  Outcome: Progressing   06/13/18 1830   Goal/Interventions addressed this shift   Pain at adequate level as identified by patient Identify patient comfort function goal;Assess for risk of opioid induced respiratory depression, including snoring/sleep apnea. Alert healthcare team of risk factors identified.;Assess pain on admission, during daily assessment and/or before any "as needed" intervention(s);Reassess pain within 30-60 minutes of any procedure/intervention, per Pain Assessment, Intervention, Reassessment (AIR) Cycle;Evaluate if patient comfort function goal is met;Offer non-pharmacological pain management interventions;Evaluate patient's satisfaction with pain management progress       Problem: Compromised Tissue integrity  Goal: Damaged tissue is healing and protected  Outcome: Progressing   06/10/18 1504   Goal/Interventions addressed this shift   Damaged tissue is healing and protected  Monitor/assess Braden scale every shift;Provide wound care per wound care algorithm;Reposition patient every 2 hours and as needed unless able to reposition self;Increase activity as tolerated/progressive mobility;Relieve pressure to bony prominences for patients at moderate and high risk;Avoid shearing injuries;Keep intact skin clean and dry;Use bath wipes, not soap and water, for daily bathing     NURSE NOTE SUMMARY     Patient Name: [name]   Attending Physician: Georgiana Spinner, *   Primary Care Physician: Md, Out Of Network (Inactive)   Date of Admission:   06/03/2018   Today's date:   06/13/2018 LOS: 10 days   Shift Summary:                                                              Patient alert and oriented.  Medicated for pain during the end of last shift.  Patient voids QS in the  urinal.  He has 19 intact staples to the right groin.  Area is reddened without any dehiscence.  HS blood sugar was 157.  SSI and lantus given.  Patient continues to have 3+ bilateral lower extremity edema.  In bed with call bell in reach.  Safety features activated.  Emotional support given. Upon reassessment of pain level patient stated it was better.   06/14/18 0211  Patient medicated for 7/10 right groin pain.  Will reassess.    7/13/ 0300 Patient blood sugar was 123 and he reports to be pain free.     Provider Notifications:      Rapid Response Notifications:  Mobility:              Weight tracking:  Family Dynamic:     Last 3 Weights for the past 72 hrs (Last 3 readings):   Weight   06/13/18 0515 138.5 kg (305 lb 6.4 oz)   06/12/18 0517 139.7 kg (307 lb 15.7 oz)   06/11/18 0450 136.6 kg (301 lb 2.4 oz)       Recent Vitals:  Active Problems:     BP 124/63   Pulse 85   Temp 99.5 F (37.5 C) (Oral)   Resp 18   Ht 1.93 m (6\' 4" )   Wt 138.5 kg (305 lb 6.4 oz)   SpO2 98%   BMI 37.17 kg/m  Active Problems:    Physical debility                   Problem: Diabetes: Glucose Imbalance  Goal: Blood glucose stable at established goal  Outcome: Progressing   06/13/18 2209   Goal/Interventions addressed this shift   Blood glucose stable at established goal  Monitor intake and output. Notify LIP if urine output is < 30 mL/hour.;Follow fluid restrictions/IV/PO parameters;Include patient/family in decisions related to nutrition/dietary selections;Assess for hypoglycemia /hyperglycemia;Monitor/assess vital signs;Coordinate medication administration with meals, as indicated;Ensure appropriate consults are obtained (Nutrition, Diabetes Education, and Case Management)

## 2018-06-13 NOTE — Progress Notes (Signed)
Patient has used ice packs to right hip tonight.  No drainage observed from either incision during the night.  Has requested pain meds twice with relief taking approx 90 min.

## 2018-06-13 NOTE — Progress Notes (Signed)
Received call from [facility]) and pt has been approved through 06/17/18 with updates due on this day.

## 2018-06-13 NOTE — Progress Notes (Signed)
Reviewed SW/CM Assistant note dated: 06-12-18  Approved content of this note on this date.

## 2018-06-13 NOTE — Progress Notes (Signed)
[facility]  [facility]   PROGRESS NOTE      Patient: [name]  Date: 06/13/2018   LOS: 10 Days  Admission Date: 06/03/2018   MRN: [id number]  Attending: Georgiana Spinner, MD     ASSESSMENT/PLAN     [name] is a 62 y.o. male with  Active Hospital Problems    Diagnosis   . Physical debility      Past Medical History:   Diagnosis Date   . Atrial flutter    . CAD (coronary artery disease)    . Carpal tunnel syndrome     H/O   . DM2 (diabetes mellitus, type 2)    . ED (erectile dysfunction)    . Encounter for blood transfusion    . Hypertension    . MI (myocardial infarction) '08, '11   . OA (osteoarthritis)    . Psoriasis        [name] is a 62 y.o. male with PMH significant for CAD, s/p bypass, aflutter on Eliquis, DM2 on oral meds, htn, OA     [facility] Pt presentedwith right hip pain and right knee pain after falling. 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 Ivar Bury. He was exercising/doing physical therapy and fell. He landed on his right knee and then on his right hip. Xray's revealed periprosthetic femur fracture. Pt was seen by Dr Emeline Darling and underwent ORIF (05/29/18). Pt has post op anemia and required 2 units of prbc. Pt had mild hyponatremia as well felt likely to be SIADH, he was treated with salt and fluid restriction with improvement.       wrc stay ( 06/03/18-)  # physical debility after fall and periprosthetic right hip fracture s/p ORIF -Pt/ot/pmr following. F/u with Dr Emeline Darling on July 16.    Pain meds as per pmr   RLE NWB     # wound cellulitis - keflex initiated on 06/06/18 evening  06/09/18:still erythema,Dr.Gore's office called back , charge nurse talked to nurse. She stated that they looked at the epic photos of his wound and they do not wish to see the patient at this time (stating that the photos look improved from 7/3). They stated to continue to monitor the wound and  to call back and schedule an appointment to be seen if it worsens (more redness, swelling, higher fever etc...) or if there is no improvement in a few days. Nurse stated to also continue keflex.   7/10- charge nurse to call dr Emeline Darling office to look at pictures , they changed f/u appt for 7/12-we will try to reach out to office re: stop day on AB       #  DM2 - lantus, metformin  , accucheck with ssi coverage , glucotrol, januvia-stable     #  Post op blood loss anemia - s/p 2 units after surgery and had 2 units day of surgery. stable    #  Post op SIADH -fluid restriction  , changed it to 1200 cc , improving     #  Hx of afib/flutter, cad, htn - cont coreg, eliquis, norvasc, cozaar   Further increased  coreg a little  for better BP control yesterday , monitor readings , also pain issues     #  Constipation proph - cont bowel regimen.  Last bm 7/12    # le edema- s/p lasix for couple days, ordered  ted hose ,  try to keep legs elevated       h/h , bmp in am     DVT/VTE Prophylaxis:  Eliquis      F/u appt:  Dr. Cheree Ditto   7/12 @ 10:15                               [phone]    CASE MANAGEMENT / SOCIAL SERVICE / DISCHARGE PLAN     Anticipated date of discharge:  06/20/2018    DISCHARGE DISPOSITION     Home with Home Health Nursing, with Home Health Occupational Therapy and with Home Health Physical Therapy      Care Plan discussed with nursing, consultants, case manager when possible    SUBJECTIVE     Therapy Current Functional Levels:   Overall ADL Level of Assist: Supervision  Toilet Transfer Level of Assist: Min Assist  Tub/Shower Level of Assist: Not Attempted  Ambulation Distance: 7, 7 feet  Ambulation Level of Assist: Min Assist  Bed/Chair/Wheelchair Level of Assist: Min Assist     No chest pain, no sob, no gu/gi complaints, no reported issues, dr Emeline Darling saw patient today , we will try to reach out to office re: stop day on AB   MEDICATIONS     Current Facility-Administered Medications   Medication Dose Route  Frequency   . acetaminophen  650 mg Oral 4 times per day   . amLODIPine  10 mg Oral Daily   . apixaban  5 mg Oral Q12H   . bisacodyl  5 mg Oral Daily   . carvedilol  12.5 mg Oral BID Meals   . cephALEXin  500 mg Oral QID   . Digestive Enzymes  3 capsule Oral BID Meals   . glipiZIDE  10 mg Oral BID AC   . insulin glargine  5 Units Subcutaneous QHS   . insulin lispro  2-16 Units Subcutaneous TID AC    And   . insulin lispro  1-9 Units Subcutaneous QHS   . lactobacillus species  50 Billion CFU Oral Daily   . losartan  50 mg Oral Daily   . metFORMIN  1,000 mg Oral BID Meals   . polyethylene glycol  17 g Oral BID   . SITagliptin  50 mg Oral BID       ROS     Remainder of 10 point ROS as above or otherwise negative    PHYSICAL EXAM     Vitals:    06/13/18 0735   BP: 154/81   Pulse: 94   Resp:    Temp:    SpO2:        Temperature: Temp  Min: 97.7 F (36.5 C)  Max: 99.5 F (37.5 C)  Pulse: Pulse  Min: 85  Max: 94  Respiratory: Resp  Min: 12  Max: 20  Non-Invasive BP: BP  Min: 136/75  Max: 154/81  Pulse Oximetry SpO2  Min: 93 %  Max: 98 %    Intake and Output Summary (Last 24 hours) at Date Time    Intake/Output Summary (Last 24 hours) at 06/13/18 1352  Last data filed at 06/13/18 1300   Gross per 24 hour   Intake              360 ml   Output             3175 ml   Net            -  2815 ml      Wt Readings from Last 3 Encounters:   06/13/18 138.5 kg (305 lb 6.4 oz)   05/26/18 135.5 kg (298 lb 11.6 oz)   05/19/18 136.1 kg (300 lb 0.7 oz)         GEN APPEARANCE: NAD;    HEENT: Conjunctiva Clear; MM  ZOX:WRUE controlled   LUNGS: CTAB; No Wheezes; No Rhonchi: No rales  ABD: Soft; Non-tender, + Normoactive BS  EXT: BLR edema, RLE edema worst vs LLE edema   SKIN: right upper thigh incision site with erythema, lower incision site looks ok   06/12/18:        06/11/18:                06/09/18:        NEURO: No gross Focal neurological deficits      LABS       Recent Labs  Lab 06/11/18  0515 06/09/18  0530   WBC 8.1 10.2   RBC 2.61* 2.59*    Hemoglobin 8.7* 8.5*   Hematocrit 26.2* 26.2*   MCV 100 101*   PLT CT 463* 480*         Recent Labs  Lab 06/12/18  0530 06/11/18  0515 06/09/18  0530 06/07/18  0400   Sodium 133* 128* 131* 132*   Potassium 4.1 4.0 4.5 4.3   Chloride 99 95* 98 98   CO2 28.2 25 26  25.2   BUN 16 19 16 18    Creatinine 0.67* 0.70* 0.67* 0.77*   Glucose 99 104* 124* 122*   Calcium 8.9 9.2 9.3 9.0             Invalid input(s): OSMOL                      (!) 111 (The above 1 analytes were performed by [facility] [phone]) [address],[place] [address] )    Microbiology, reviewed and significant for:  No results for input(s): MICRO in the last 24 hours.      RADIOLOGY     Radiological Procedure reviewed.    XR Chest AP Portable   Final Result      No acute airspace disease. Stable minimal vascular prominence but no congestive failure.      No acute airspace disease. No pneumothorax or pleural effusions.      Stable cardiomegaly. Previous CABG and sternotomy. DJD bilateral shoulders.      ReadingStation:[email or web address]          Signed,  Georgiana Spinner, MD  Internal Medicine/Geriatrics  1:52 PM 06/13/2018

## 2018-06-13 NOTE — Progress Notes (Signed)
Placed a call to [employer] to speak with case manager about continued stay at [facility].  No answer and no way to leave a voice mail.  SW sent a fax sheet asking for information on this pt and a return phone call.  Pt's last approved day was 06/10/18 with updates due. Clinicals were sent on 06/10/18.

## 2018-06-13 NOTE — Progress Notes (Signed)
Received call from [place] at [facility].  Keflex can be discontinued due to start date of 7/5.  Physician updated.

## 2018-06-13 NOTE — Plan of Care (Signed)
Problem: Pain interferes with ability to perform ADL  Goal: Pain at adequate level as identified by patient  Outcome: Progressing   06/13/18 1830   Goal/Interventions addressed this shift   Pain at adequate level as identified by patient Identify patient comfort function goal;Assess for risk of opioid induced respiratory depression, including snoring/sleep apnea. Alert healthcare team of risk factors identified.;Assess pain on admission, during daily assessment and/or before any "as needed" intervention(s);Reassess pain within 30-60 minutes of any procedure/intervention, per Pain Assessment, Intervention, Reassessment (AIR) Cycle;Evaluate if patient comfort function goal is met;Offer non-pharmacological pain management interventions;Evaluate patient's satisfaction with pain management progress     NURSE NOTE SUMMARY     Patient Name: [name]   Attending Physician: Georgiana Spinner, *   Primary Care Physician: Md, Out Of Network (Inactive)   Date of Admission:   06/03/2018   Today's date:   06/13/2018 LOS: 10 days   Shift Summary:                                                              Pt was medication compliant, participated in therapy, and remained free from falls this shift.    Pt reported various pain levels from 2 - 9 throughout the day.  Pt requested PRN oxycodone and had scheduled Tylenol to manage pain.    Pt left facility for a follow up with orthopedist to check Pt's progress in healing.  The majority of Pt's staples were removed but some were left in the groin area to assist with healing.  Pt reported doctor wants him to leave groin area open to air when lying down to help it dry out.  Drainage from would was less today than it was yesterday.   Provider Notifications:      Rapid Response Notifications:  Mobility:              Weight tracking:  Family Dynamic:     Last 3 Weights for the past 72 hrs (Last 3 readings):   Weight   06/13/18 0515 138.5 kg (305 lb 6.4 oz)   06/12/18 0517 139.7  kg (307 lb 15.7 oz)   06/11/18 0450 136.6 kg (301 lb 2.4 oz)       Recent Vitals:  Active Problems:     BP 149/78   Pulse 88   Temp 98.6 F (37 C) (Oral)   Resp 20   Ht 1.93 m (6\' 4" )   Wt 138.5 kg (305 lb 6.4 oz)   SpO2 94%   BMI 37.17 kg/m          Active Problems:    Physical debility

## 2018-06-13 NOTE — Progress Notes (Signed)
PHYSICAL MEDICINE AND REHABILITATION PROGRESS NOTE    Date: 06/13/18 Time: 11:30 AM  Patient Name: [name], [name]    CC:  On consult for functional and rehabilitative needs; general pain management; and assessment of medical complexities and co-morbidities as barriers to rehabilitation.      SUBJECTIVE   Patient is concerned about going to his orthopedic appointment today since he is having some loose/soft stools however given his degree of pain I feel that he should keep the follow-up appointment.  We again discussed his pain medication usage.  I decrease Tylenol to 4 doses per day.  He took 50 mg of oxycodone yesterday.  He has arranged to stay with his friend Dr. Tressia Danas after discharge and is working with a family member to come and assist him if necessary.      Therapy Current Functional Levels:   Overall ADL Level of Assist: Supervision  Toilet Transfer Level of Assist: Min Assist  Tub/Shower Level of Assist: Not Attempted  Ambulation Distance: 13, 13   Ambulation Level of Assist: Min Assist  Bed/Chair/Wheelchair Level of Assist: Min Assist       REVIEW OF SYSTEM     Constitutional:  Denies fever/chills/unexplained weight changes  HEENT:  Denies headache, vision changes  CV:        Denies chest pain/palpitations  Pulm:    Denies shortness of breath  GI:      Denies nausea/vomitting/abd pain; Regular bowel patterns  GU:    Denies bloody/burning urination/trouble controlling bladder  Neuro:   Denies weakness;  Denies paresthesia  Musculoskeletal:   Denies new falls/injuries  Psych: mood euthymic      MEDICATIONS   SCHEDULED MEDICATIONS  Current Facility-Administered Medications   Medication Dose Route Frequency   . acetaminophen  650 mg Oral 4 times per day   . amLODIPine  10 mg Oral Daily   . apixaban  5 mg Oral Q12H   . bisacodyl  5 mg Oral Daily   . carvedilol  12.5 mg Oral BID Meals   . cephALEXin  500 mg Oral QID   . Digestive Enzymes  3 capsule Oral BID Meals   . glipiZIDE  10 mg Oral BID AC   .  insulin glargine  5 Units Subcutaneous QHS   . insulin lispro  2-16 Units Subcutaneous TID AC    And   . insulin lispro  1-9 Units Subcutaneous QHS   . lactobacillus species  50 Billion CFU Oral Daily   . losartan  50 mg Oral Daily   . metFORMIN  1,000 mg Oral BID Meals   . polyethylene glycol  17 g Oral BID   . SITagliptin  50 mg Oral BID       AS NEEDED MEDICATIONS  acetaminophen, diphenhydrAMINE, diphenhydrAMINE, gabapentin, NSG Communication: Glucose POCT order (AC, HS) **AND** insulin lispro **AND** insulin lispro **AND** insulin lispro, naloxone, ondansetron **OR** ondansetron, ondansetron, oxyCODONE, oxyCODONE, promethazine **OR** promethazine **OR** promethazine, tiZANidine, traMADol    PHYSICAL EXAMINATION   VITAL SIGNS  Vitals:    06/13/18 0735   BP: 154/81   Pulse: 94   Resp:    Temp:    SpO2:        EXAMINATION  GENERAL:                  Alert and oriented X 3. NAD, Pleasant and cooperative.  Seen in physical therapy  LUNGS:  Clear to auscultation, no wheezing, no rhonchi  HEART:                       Irregular  Rhythm without murmur, gallops, or rubs  ABDOMEN:      Soft, nontender, bowel sounds present. No organomegaly. No rebound tenderness  EXTREMITIES:           No pain, cyanosis, or edema. Neg Homan's on left.  Significant RLE edema, hip to toes, mildly improved.  Right anterior groin dressing with serous drainage proximally (scant yellow serous, less erythema surrounding staples).    Stable  Lower lateral incision c/d/i.   NEUROLOGIC:           Alert, oriented, non-focal findings  SKIN:                           Intact  MOTOR:                      Functional strength throughout left.  RLE limited by pain.  Dorsiflexion/plantarflexion 5/5.   Taut hamstrings, adductors. Less edema  SENS:                         Intact bilat to PP/LT, proprioception    OBJECTIVE DATA   Laboratory  Results     Procedure Component Value Units Date/Time    Dextrose Stick Glucose [[id number]]   (Abnormal) Collected:  06/13/18 0730    Specimen:  Blood Updated:  06/13/18 0747     Glucose, POCT 142 (H) mg/dL     Dextrose Stick Glucose [[id number]]  (Abnormal) Collected:  06/13/18 0349    Specimen:  Blood Updated:  06/13/18 0406     Glucose, POCT 145 (H) mg/dL     Dextrose Stick Glucose [[id number]]  (Abnormal) Collected:  06/12/18 2125    Specimen:  Blood Updated:  06/12/18 2142     Glucose, POCT 141 (H) mg/dL     Dextrose Stick Glucose [[id number]]  (Abnormal) Collected:  06/12/18 1638    Specimen:  Blood Updated:  06/12/18 1654     Glucose, POCT 140 (H) mg/dL     Dextrose Stick Glucose [[id number]]  (Abnormal) Collected:  06/12/18 1143    Specimen:  Blood Updated:  06/12/18 1201     Glucose, POCT 140 (H) mg/dL           Micro  Microbiology Results     None            Radiology  XR Chest AP Portable   Final Result      No acute airspace disease. Stable minimal vascular prominence but no congestive failure.      No acute airspace disease. No pneumothorax or pleural effusions.      Stable cardiomegaly. Previous CABG and sternotomy. DJD bilateral shoulders.      ReadingStation:[id number]            ASSESSMENT   SUMMARY STATEMENT:  The patient states that he came to [facility] for an elective right total hip replacement.  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  He was recovering well from the hip replacement when he had a fall landing on the right knee and suffered a periprosthetic hip fracture.  Patient underwent surgical repair and is currently nonweightbearing.  He does state  that knee pain and generalized pain are somewhat limiting.  Last bowel movement was yesterday.  Post op required 4 Units PRBC and developed hyponatremia.      Diagnosis:     Active Hospital Problems    Diagnosis   . Physical debility   1.  S/p right periprosthetic hip fracture after THR.  NWB.              -f/u Dr. Emeline Darling 7/12 due to  pain, drainage                gait / Impaired mobility, ADLs / Fall risks / Debility - OT /PT, Fall precautions    Continue with current rehabilitative and medical management plan as discussed above.   Patient is participating in therapy, working towards goals. Refer to therapy notes in EHR for details of current progress.    Coordination of care:   Rehabilitation goal setting and medical management plan discussed with therapy, nursing, and hospitalist team in AM meetings  Medication list and pertinent lab work reviewed.   Medical management as per hospitalist  Patient to continue acute rehabilitation.     [place]ie Goal: 7/19 as may need to change to sliding board.  Will see , HH PT/OT/RN    2.  DVT risks - eliquis  3.  Pain - Initially started on long acting and scheduled Zanaflex qhs. Pt declined. Switched back to Roxi 5-10mg  q3 prn, Zanaflex prn.  IM d/c'd neurontin.  Last used Tramadol 7/6.  Used 60 mg 7/8, 50 mg 7/9 and 40 mg 7/10.  Will decrease tylenol.  50 mg 7/11  4.   PVRs, bowel program 7/12  5.  DM-per IM  6.  Afib/flutter - on eliquis  7.  HTN/CAD-coreg, norvasc  8.  SIADH - improving, off fluid restrictions, resume fluid restrictions and lasix per IM  9.  Post op anemia - monitor  10.  Wound care, dressing changes q shift. On Keflex. Instructed to start with abx and current care by Dr. Ellyn Hack office on 7/8.     PLAN   Patient is participating in therapy, working towards goals.   Refer to therapy notes in EHR for details of current progress.  Patient to continue acute rehabilitation  Continue with current medical management plan as per Internal Medicine hospitalist attending team.        Eddie Candle, MD  Physical Medicine and Rehabilitation

## 2018-06-13 NOTE — PT Progress Note (Addendum)
PT Progress NotePatient suffers from  Hip frx -Open reduction internal fixation and is non-weightbearing which impairs their ability to perform daily activities like toileting, feeding, dressing, grooming, bathing in the home. A cane, walker will not resolve issue with performing activities of daily living . A  High strength light -weight wheelchair wheelchair will allow patient to safely performing daily activities.Pt alos requires a elevating leg rest   1) Patient self-propels the wheelchair while engaging in frequent activities as laundray, toileting, meals which cannot be performed in a standard or lightweight wheelchair due to the weight of the chair.Pt requires a high- strength light weight wheelchair  2) Patient requires a set dimension of   (22w x 20  which is not available in a standard or lightweight wheelchair and patient spends at least two hours per day in their chair.Pt requires a high strength light weight wheelchair.Vevelyn Royals PT

## 2018-06-14 LAB — BASIC METABOLIC PANEL
Anion Gap: 10.3 mMol/L (ref 7.0–18.0)
BUN / Creatinine Ratio: 19.7 Ratio (ref 10.0–30.0)
BUN: 14 mg/dL (ref 7–22)
CO2: 26 mMol/L (ref 20–30)
Calcium: 8.7 mg/dL (ref 8.5–10.5)
Chloride: 100 mMol/L (ref 98–110)
Creatinine: 0.71 mg/dL — ABNORMAL LOW (ref 0.80–1.30)
EGFR: 101 mL/min/{1.73_m2} (ref 60–150)
Glucose: 116 mg/dL — ABNORMAL HIGH (ref 71–99)
Osmolality Calc: 266 mOsm/kg — ABNORMAL LOW (ref 275–300)
Potassium: 4.3 mMol/L (ref 3.5–5.3)
Sodium: 132 mMol/L — ABNORMAL LOW (ref 136–147)

## 2018-06-14 LAB — VH DEXTROSE STICK GLUCOSE
Glucose POCT: 123 mg/dL — ABNORMAL HIGH (ref 71–99)
Glucose POCT: 150 mg/dL — ABNORMAL HIGH (ref 71–99)
Glucose POCT: 120 mg/dL — ABNORMAL HIGH (ref 71–99)
Glucose POCT: 109 mg/dL — ABNORMAL HIGH (ref 71–99)
Glucose POCT: 158 mg/dL — ABNORMAL HIGH (ref 71–99)

## 2018-06-14 LAB — HEMOGLOBIN AND HEMATOCRIT, BLOOD
Hematocrit: 26.3 % — ABNORMAL LOW (ref 39.0–52.5)
Hemoglobin: 8.6 gm/dL — ABNORMAL LOW (ref 13.0–17.5)

## 2018-06-14 NOTE — Plan of Care (Signed)
Problem: Moderate/High Fall Risk Score >5  Goal: Patient will remain free of falls  Outcome: Progressing   06/14/18 1605   Moderate Risk Falls Interventions (6-13)   VH Moderate Risk (6-13) ALL REQUIRED LOW INTERVENTIONS   OTHER   Moderate Risk (6-13) LOW-Fall Interventions Appropriate for Low Fall Risk   High (Greater than 13) LOW-Fall Interventions Appropriate for Low Fall Risk   High Risk Falls Interventions (Greater than 13)   VH High Risk (Greater than 13) ALL REQUIRED LOW INTERVENTIONS

## 2018-06-14 NOTE — Progress Notes (Signed)
Right thigh

## 2018-06-14 NOTE — Progress Notes (Signed)
[facility]  [facility]   PROGRESS NOTE      Patient: [name]  Date: 06/14/2018   LOS: 11 Days  Admission Date: 06/03/2018   MRN: [id number]  Attending: Georgiana Spinner, MD     ASSESSMENT/PLAN     [name] is a 62 y.o. male with  Active Hospital Problems    Diagnosis   . Physical debility      Past Medical History:   Diagnosis Date   . Atrial flutter    . CAD (coronary artery disease)    . Carpal tunnel syndrome     H/O   . DM2 (diabetes mellitus, type 2)    . ED (erectile dysfunction)    . Encounter for blood transfusion    . Hypertension    . MI (myocardial infarction) '08, '11   . OA (osteoarthritis)    . Psoriasis        [name] is a 61 y.o. male with PMH significant for CAD, s/p bypass, aflutter on Eliquis, DM2 on oral meds, htn, OA     [facility]. Pt presentedwith right hip pain and right knee pain after falling. 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 Ivar Bury. He was exercising/doing physical therapy and fell. He landed on his right knee and then on his right hip. Xray's revealed periprosthetic femur fracture. Pt was seen by Dr Emeline Darling and underwent ORIF (05/29/18). Pt has post op anemia and required 2 units of prbc. Pt had mild hyponatremia as well felt likely to be SIADH, he was treated with salt and fluid restriction with improvement.       wrc stay ( 06/03/18-)  # physical debility after fall and periprosthetic right hip fracture s/p ORIF -Pt/ot/pmr following. F/u with Dr Emeline Darling on July 16.  - as per charge nurse [facility]) report, Dr Emeline Darling wanted to stop keflex   .  Also ortho  recommended toe-touch weightbearing with walker, work with knee extension, may remove remainder of staples in 1 week, please keep incisions clean, allow proximal incision site to air out through the day, dressing only for PT, follow-up in 2 weeks.  Pain meds as per pmr     # wound cellulitis - keflex  initiated on 06/06/18 evening  06/09/18:still erythema,Dr.Gore's office called back , charge nurse talked to nurse. She stated that they looked at the epic photos of his wound and they do not wish to see the patient at this time (stating that the photos look improved from 7/3). They stated to continue to monitor the wound and to call back and schedule an appointment to be seen if it worsens (more redness, swelling, higher fever etc...) or if there is no improvement in a few days. Nurse stated to also continue keflex.   7/10- charge nurse to call dr Emeline Darling office to look at pictures , they changed f/u appt for 7/12-we will try to reach out to office re: stop day on AB - as per DR Emeline Darling we can dtop keflex       #  DM2 - lantus, metformin  , accucheck with ssi coverage , glucotrol, januvia-stable     #  Post op blood loss anemia - s/p 2 units after surgery and had 2 units day of surgery. stable    #  Post op SIADH -fluid restriction  , changed it to 1200 cc , improving     #  Hx  of afib/flutter, cad, htn - cont coreg, eliquis, norvasc, cozaar   Further increased  coreg a little  for better BP control 2 days ago , monitor readings , also pain issues     #  Constipation proph - cont bowel regimen.  Last bm 7/13    # le edema- s/p lasix for couple days, ordered  ted hose , try to keep legs elevated       DVT/VTE Prophylaxis:  Eliquis      F/u appt:  Dr. Cheree Ditto   7/12 @ 10:15                               [phone]    CASE MANAGEMENT / SOCIAL SERVICE / DISCHARGE PLAN     Anticipated date of discharge:  06/20/2018    DISCHARGE DISPOSITION     Home with Home Health Nursing, with Home Health Occupational Therapy and with Home Health Physical Therapy      Care Plan discussed with nursing, consultants, case manager when possible    SUBJECTIVE     Therapy Current Functional Levels:   Overall ADL Level of Assist: Supervision  Toilet Transfer Level of Assist: Min Assist  Tub/Shower Level of Assist: Not Attempted  Ambulation  Distance: 8, 8 feet  Ambulation Level of Assist: Min Assist  Bed/Chair/Wheelchair Level of Assist: Min Assist     No chest pain, no sob, no gu/gi complaints, no reported issues, ortho  recommended toe-touch weightbearing with walker, work with knee extension, may remove remainder of staples in 1 week, please keep incisions clean, allow proximal incision site to air out through the day, dressing only for PT, follow-up in 2 weeks.    MEDICATIONS     Current Facility-Administered Medications   Medication Dose Route Frequency   . acetaminophen  650 mg Oral 4 times per day   . amLODIPine  10 mg Oral Daily   . apixaban  5 mg Oral Q12H   . bisacodyl  5 mg Oral Daily   . carvedilol  12.5 mg Oral BID Meals   . Digestive Enzymes  3 capsule Oral BID Meals   . glipiZIDE  10 mg Oral BID AC   . insulin glargine  5 Units Subcutaneous QHS   . insulin lispro  2-16 Units Subcutaneous TID AC    And   . insulin lispro  1-9 Units Subcutaneous QHS   . lactobacillus species  50 Billion CFU Oral Daily   . losartan  50 mg Oral Daily   . metFORMIN  1,000 mg Oral BID Meals   . SITagliptin  50 mg Oral BID       ROS     Remainder of 10 point ROS as above or otherwise negative    PHYSICAL EXAM     Vitals:    06/14/18 1322   BP: 135/72   Pulse: 86   Resp: 20   Temp: 98.6 F (37 C)   SpO2: 98%       Temperature: Temp  Min: 98.2 F (36.8 C)  Max: 99.5 F (37.5 C)  Pulse: Pulse  Min: 85  Max: 88  Respiratory: Resp  Min: 18  Max: 20  Non-Invasive BP: BP  Min: 124/63  Max: 153/82  Pulse Oximetry SpO2  Min: 95 %  Max: 98 %    Intake and Output Summary (Last 24 hours) at Date Time    Intake/Output Summary (Last 24  hours) at 06/14/18 1323  Last data filed at 06/14/18 1300   Gross per 24 hour   Intake              720 ml   Output             1100 ml   Net             -380 ml      Wt Readings from Last 3 Encounters:   06/14/18 138.5 kg (305 lb 5.4 oz)   05/26/18 135.5 kg (298 lb 11.6 oz)   05/19/18 136.1 kg (300 lb 0.7 oz)         GEN APPEARANCE: NAD;     HEENT: Conjunctiva Clear; MM  ACZ:YSAY controlled   LUNGS: CTAB; No Wheezes; No Rhonchi: No rales  ABD: Soft; Non-tender, + Normoactive BS  EXT: BLR edema, RLE edema worst vs LLE edema   SKIN: right upper thigh incision site with erythema, lower incision site looks ok   06/14/18:        06/12/18:        06/11/18:                06/09/18:        NEURO: No gross Focal neurological deficits      LABS       Recent Labs  Lab 06/14/18  0630 06/11/18  0515 06/09/18  0530   WBC  --  8.1 10.2   RBC  --  2.61* 2.59*   Hemoglobin 8.6* 8.7* 8.5*   Hematocrit 26.3* 26.2* 26.2*   MCV  --  100 101*   PLT CT  --  463* 480*         Recent Labs  Lab 06/14/18  0630 06/12/18  0530 06/11/18  0515 06/09/18  0530   Sodium 132* 133* 128* 131*   Potassium 4.3 4.1 4.0 4.5   Chloride 100 99 95* 98   CO2 26 28.2 25 26    BUN 14 16 19 16    Creatinine 0.71* 0.67* 0.70* 0.67*   Glucose 116* 99 104* 124*   Calcium 8.7 8.9 9.2 9.3             Invalid input(s): OSMOL                      (!) 120 (The above 1 analytes were performed by [facility] [phone]) [address],[place] [address] )    Microbiology, reviewed and significant for:  No results for input(s): MICRO in the last 24 hours.      RADIOLOGY     Radiological Procedure reviewed.    XR Chest AP Portable   Final Result      No acute airspace disease. Stable minimal vascular prominence but no congestive failure.      No acute airspace disease. No pneumothorax or pleural effusions.      Stable cardiomegaly. Previous CABG and sternotomy. DJD bilateral shoulders.      ReadingStation:[email or web address]          Signed,  Georgiana Spinner, MD  Internal Medicine/Geriatrics  1:23 PM 06/14/2018

## 2018-06-15 LAB — VH DEXTROSE STICK GLUCOSE
Glucose POCT: 96 mg/dL (ref 71–99)
Glucose POCT: 116 mg/dL — ABNORMAL HIGH (ref 71–99)
Glucose POCT: 82 mg/dL (ref 71–99)
Glucose POCT: 148 mg/dL — ABNORMAL HIGH (ref 71–99)
Glucose POCT: 141 mg/dL — ABNORMAL HIGH (ref 71–99)

## 2018-06-15 MED ORDER — POLYETHYLENE GLYCOL 3350 17 G PO PACK
17.00 g | PACK | Freq: Every day | ORAL | Status: DC
Start: 2018-06-15 — End: 2018-06-17
  Administered 2018-06-15 – 2018-06-17 (×2): 17 g via ORAL
  Filled 2018-06-15 (×2): qty 1

## 2018-06-15 MED ORDER — CARVEDILOL 3.125 MG PO TABS
15.6250 mg | ORAL_TABLET | Freq: Two times a day (BID) | ORAL | Status: DC
Start: 2018-06-15 — End: 2018-06-18
  Administered 2018-06-15 – 2018-06-18 (×6): 15.625 mg via ORAL
  Filled 2018-06-15 (×2): qty 2
  Filled 2018-06-15 (×3): qty 1
  Filled 2018-06-15: qty 2
  Filled 2018-06-15: qty 1
  Filled 2018-06-15: qty 2
  Filled 2018-06-15: qty 1
  Filled 2018-06-15: qty 2

## 2018-06-15 NOTE — Progress Notes (Signed)
[facility]  [facility]   PROGRESS NOTE      Patient: [name]  Date: 06/15/2018   LOS: 12 Days  Admission Date: 06/03/2018   MRN: [id number]  Attending: Georgiana Spinner, MD     ASSESSMENT/PLAN     [name] is a 62 y.o. male with  Active Hospital Problems    Diagnosis   . Physical debility      Past Medical History:   Diagnosis Date   . Atrial flutter    . CAD (coronary artery disease)    . Carpal tunnel syndrome     H/O   . DM2 (diabetes mellitus, type 2)    . ED (erectile dysfunction)    . Encounter for blood transfusion    . Hypertension    . MI (myocardial infarction) '08, '11   . OA (osteoarthritis)    . Psoriasis        [name] is a 62 y.o. male with PMH significant for CAD, s/p bypass, aflutter on Eliquis, DM2 on oral meds, htn, OA     [facility]. Pt presentedwith right hip pain and right knee pain after falling. 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 Ivar Bury. He was exercising/doing physical therapy and fell. He landed on his right knee and then on his right hip. Xray's revealed periprosthetic femur fracture. Pt was seen by Dr Emeline Darling and underwent ORIF (05/29/18). Pt has post op anemia and required 2 units of prbc. Pt had mild hyponatremia as well felt likely to be SIADH, he was treated with salt and fluid restriction with improvement.       wrc stay ( 06/03/18-)  # physical debility after fall and periprosthetic right hip fracture s/p ORIF -Pt/ot/pmr following. F/u with Dr Emeline Darling on July 16.  - as per charge nurse [facility]) report, Dr Emeline Darling wanted to stop keflex   .  Also ortho  recommended toe-touch weightbearing with walker, work with knee extension, may remove remainder of staples in 1 week, please keep incisions clean, allow proximal incision site to air out through the day, dressing only for PT, follow-up in 2 weeks.  Pain meds as per pmr     # wound cellulitis - keflex  initiated on 06/06/18 evening  06/09/18:still erythema,Dr.Gore's office called back , charge nurse talked to nurse. She stated that they looked at the epic photos of his wound and they do not wish to see the patient at this time (stating that the photos look improved from 7/3). They stated to continue to monitor the wound and to call back and schedule an appointment to be seen if it worsens (more redness, swelling, higher fever etc...) or if there is no improvement in a few days. Nurse stated to also continue keflex.   7/10- charge nurse to call dr Emeline Darling office to look at pictures , they changed f/u appt for 7/12-we will try to reach out to office re: stop day on AB - as per DR Emeline Darling we can stop keflex       #  DM2 - lantus, metformin  , accucheck with ssi coverage , glucotrol, januvia-stable     #  Post op blood loss anemia - s/p 2 units after surgery and had 2 units day of surgery. stable    #  Post op SIADH -fluid restriction  , changed it to 1200 cc , improving     #  Hx  of afib/flutter, cad, htn - cont coreg, eliquis, norvasc, cozaar   Further increase  coreg  for better BP     #  Constipation proph - cont bowel regimen.  Last bm 7/14  He requested to re start miralax, he feels constipated     # le edema- s/p lasix for couple days, ordered  ted hose , try to keep legs elevated       DVT/VTE Prophylaxis:  Eliquis      F/u appt:  Dr. Cheree Ditto   7/12 @ 10:15                               [phone]    CASE MANAGEMENT / SOCIAL SERVICE / DISCHARGE PLAN     Anticipated date of discharge:  06/20/2018    DISCHARGE DISPOSITION     Home with Home Health Nursing, with Home Health Occupational Therapy and with Home Health Physical Therapy      Care Plan discussed with nursing, consultants, case manager when possible      Cbc, bmp in am   SUBJECTIVE     Therapy Current Functional Levels:   Overall ADL Level of Assist: Supervision  Toilet Transfer Level of Assist: Min Assist  Tub/Shower Level of Assist: Not Attempted   Ambulation Distance: 8 feet  Ambulation Level of Assist: Min Assist  Bed/Chair/Wheelchair Level of Assist: Min Assist     No chest pain, no sob, no gu complaints, no reported issues,  Patient stated that he would like to have his miralax back, he feels constipated     MEDICATIONS     Current Facility-Administered Medications   Medication Dose Route Frequency   . acetaminophen  650 mg Oral 4 times per day   . amLODIPine  10 mg Oral Daily   . apixaban  5 mg Oral Q12H   . bisacodyl  5 mg Oral Daily   . carvedilol  12.5 mg Oral BID Meals   . Digestive Enzymes  3 capsule Oral BID Meals   . glipiZIDE  10 mg Oral BID AC   . insulin glargine  5 Units Subcutaneous QHS   . insulin lispro  2-16 Units Subcutaneous TID AC    And   . insulin lispro  1-9 Units Subcutaneous QHS   . lactobacillus species  50 Billion CFU Oral Daily   . losartan  50 mg Oral Daily   . metFORMIN  1,000 mg Oral BID Meals   . polyethylene glycol  17 g Oral Daily   . SITagliptin  50 mg Oral BID       ROS     Remainder of 10 point ROS as above or otherwise negative    PHYSICAL EXAM     Vitals:    06/15/18 0738   BP: 151/79   Pulse: 90   Resp:    Temp:    SpO2:        Temperature: Temp  Min: 97.7 F (36.5 C)  Max: 99.5 F (37.5 C)  Pulse: Pulse  Min: 74  Max: 90  Respiratory: Resp  Min: 18  Max: 20  Non-Invasive BP: BP  Min: 122/71  Max: 156/83  Pulse Oximetry SpO2  Min: 95 %  Max: 98 %    Intake and Output Summary (Last 24 hours) at Date Time    Intake/Output Summary (Last 24 hours) at 06/15/18 1154  Last data filed at 06/15/18 0900  Gross per 24 hour   Intake              720 ml   Output              750 ml   Net              -30 ml      Wt Readings from Last 3 Encounters:   06/15/18 136.7 kg (301 lb 5.9 oz)   05/26/18 135.5 kg (298 lb 11.6 oz)   05/19/18 136.1 kg (300 lb 0.7 oz)         GEN APPEARANCE: NAD;    HEENT: Conjunctiva Clear; MM  FAO:ZHYQ controlled   LUNGS: CTAB; No Wheezes; No Rhonchi: No rales  ABD: Soft; Non-tender, + Normoactive BS  EXT:  BLR edema, RLE edema worst vs LLE edema   SKIN: right upper thigh incision site with less  erythema, lower incision site looks ok   06/14/18:        06/12/18:        06/11/18:                06/09/18:        NEURO: No gross Focal neurological deficits      LABS       Recent Labs  Lab 06/14/18  0630 06/11/18  0515 06/09/18  0530   WBC  --  8.1 10.2   RBC  --  2.61* 2.59*   Hemoglobin 8.6* 8.7* 8.5*   Hematocrit 26.3* 26.2* 26.2*   MCV  --  100 101*   PLT CT  --  463* 480*         Recent Labs  Lab 06/14/18  0630 06/12/18  0530 06/11/18  0515 06/09/18  0530   Sodium 132* 133* 128* 131*   Potassium 4.3 4.1 4.0 4.5   Chloride 100 99 95* 98   CO2 26 28.2 25 26    BUN 14 16 19 16    Creatinine 0.71* 0.67* 0.70* 0.67*   Glucose 116* 99 104* 124*   Calcium 8.7 8.9 9.2 9.3             Invalid input(s): OSMOL                      (!) 116 (The above 1 analytes were performed by [facility] [phone]) [address],[place] [address] )    Microbiology, reviewed and significant for:  No results for input(s): MICRO in the last 24 hours.      RADIOLOGY     Radiological Procedure reviewed.    XR Chest AP Portable   Final Result      No acute airspace disease. Stable minimal vascular prominence but no congestive failure.      No acute airspace disease. No pneumothorax or pleural effusions.      Stable cardiomegaly. Previous CABG and sternotomy. DJD bilateral shoulders.      ReadingStation:[email or web address]          Signed,  Georgiana Spinner, MD  Internal Medicine/Geriatrics  11:54 AM 06/15/2018

## 2018-06-15 NOTE — Plan of Care (Signed)
Problem: Moderate/High Fall Risk Score >5  Goal: Patient will remain free of falls  Outcome: Progressing   06/15/18 1422   Moderate Risk Falls Interventions (6-13)   VH Moderate Risk (6-13) ALL REQUIRED LOW INTERVENTIONS   OTHER   Moderate Risk (6-13) LOW-Fall Interventions Appropriate for Low Fall Risk   High (Greater than 13) LOW-Fall Interventions Appropriate for Low Fall Risk   High Risk Falls Interventions (Greater than 13)   VH High Risk (Greater than 13) ALL REQUIRED LOW INTERVENTIONS

## 2018-06-15 NOTE — Plan of Care (Signed)
Problem: Pain interferes with ability to perform ADL  Goal: Pain at adequate level as identified by patient  Outcome: Progressing   06/13/18 1830   Goal/Interventions addressed this shift   Pain at adequate level as identified by patient Identify patient comfort function goal;Assess for risk of opioid induced respiratory depression, including snoring/sleep apnea. Alert healthcare team of risk factors identified.;Assess pain on admission, during daily assessment and/or before any "as needed" intervention(s);Reassess pain within 30-60 minutes of any procedure/intervention, per Pain Assessment, Intervention, Reassessment (AIR) Cycle;Evaluate if patient comfort function goal is met;Offer non-pharmacological pain management interventions;Evaluate patient's satisfaction with pain management progress     NURSE NOTE SUMMARY     Patient Name: [name]   Attending Physician: Georgiana Spinner, *   Primary Care Physician: Md, Out Of Network (Inactive)   Date of Admission:   06/03/2018   Today's date:   06/15/2018 LOS: 12 days   Shift Summary:                                                              Patient medication with oxycodone for pain PRN, effective. Wounds open to air with gauze in skin fold. FS at HS 158. Lantus and SSI given. Call bell with in reach and bed alarm set.   Provider Notifications:      Rapid Response Notifications:  Mobility:              Weight tracking:  Family Dynamic:     Last 3 Weights for the past 72 hrs (Last 3 readings):   Weight   06/14/18 0600 138.5 kg (305 lb 5.4 oz)   06/13/18 0515 138.5 kg (305 lb 6.4 oz)   06/12/18 0517 139.7 kg (307 lb 15.7 oz)       Recent Vitals:  Active Problems:     BP 122/71   Pulse 88   Temp 99.5 F (37.5 C) (Oral)   Resp 18   Ht 1.93 m (6\' 4" )   Wt 138.5 kg (305 lb 5.4 oz)   SpO2 95%   BMI 37.17 kg/m          Active Problems:    Physical debility

## 2018-06-16 LAB — CBC AND DIFFERENTIAL
Basophils %: 0.5 % (ref 0.0–3.0)
Basophils Absolute: 0 10*3/uL (ref 0.0–0.3)
Eosinophils %: 0.3 % (ref 0.0–7.0)
Eosinophils Absolute: 0 10*3/uL (ref 0.0–0.8)
Hematocrit: 26.6 % — ABNORMAL LOW (ref 39.0–52.5)
Hemoglobin: 8.7 gm/dL — ABNORMAL LOW (ref 13.0–17.5)
Lymphocytes Absolute: 1 10*3/uL (ref 0.6–5.1)
Lymphocytes: 12.6 % — ABNORMAL LOW (ref 15.0–46.0)
MCH: 33 pg (ref 28–35)
MCHC: 33 gm/dL (ref 32–36)
MCV: 100 fL (ref 80–100)
MPV: 7 fL (ref 6.0–10.0)
Monocytes Absolute: 0.5 10*3/uL (ref 0.1–1.7)
Monocytes: 6.8 % (ref 3.0–15.0)
Neutrophils %: 79.8 % — ABNORMAL HIGH (ref 42.0–78.0)
Neutrophils Absolute: 6 10*3/uL (ref 1.7–8.6)
PLT CT: 421 10*3/uL (ref 130–440)
RBC: 2.66 10*6/uL — ABNORMAL LOW (ref 4.00–5.70)
RDW: 17.2 % — ABNORMAL HIGH (ref 11.0–14.0)
WBC: 7.5 10*3/uL (ref 4.0–11.0)

## 2018-06-16 LAB — BASIC METABOLIC PANEL
Anion Gap: 12.2 mMol/L (ref 7.0–18.0)
BUN / Creatinine Ratio: 18.2 Ratio (ref 10.0–30.0)
BUN: 12 mg/dL (ref 7–22)
CO2: 25 mMol/L (ref 20–30)
Calcium: 8.9 mg/dL (ref 8.5–10.5)
Chloride: 99 mMol/L (ref 98–110)
Creatinine: 0.66 mg/dL — ABNORMAL LOW (ref 0.80–1.30)
EGFR: 104 mL/min/{1.73_m2} (ref 60–150)
Glucose: 91 mg/dL (ref 71–99)
Osmolality Calc: 264 mOsm/kg — ABNORMAL LOW (ref 275–300)
Potassium: 4.2 mMol/L (ref 3.5–5.3)
Sodium: 132 mMol/L — ABNORMAL LOW (ref 136–147)

## 2018-06-16 LAB — VH DEXTROSE STICK GLUCOSE
Glucose POCT: 115 mg/dL — ABNORMAL HIGH (ref 71–99)
Glucose POCT: 94 mg/dL (ref 71–99)
Glucose POCT: 129 mg/dL — ABNORMAL HIGH (ref 71–99)
Glucose POCT: 163 mg/dL — ABNORMAL HIGH (ref 71–99)

## 2018-06-16 NOTE — Plan of Care (Addendum)
NURSE NOTE SUMMARY     Patient Name: [name], [name]   Attending Physician: Deirdre Peer, MD   Primary Care Physician: Md, Out Of Network (Inactive)   Date of Admission:   06/03/2018   Today's date:   06/16/2018 LOS: 13 days   Shift Summary:                                                                 Provider Notifications:      Rapid Response Notifications:  Mobility:              Weight tracking:  Family Dynamic:     Last 3 Weights for the past 72 hrs (Last 3 readings):   Weight   06/16/18 0534 139 kg (306 lb 7 oz)   06/15/18 0500 136.7 kg (301 lb 5.9 oz)   06/14/18 0600 138.5 kg (305 lb 5.4 oz)       Recent Vitals:  Active Problems:     BP 143/76   Pulse 90   Temp 98.8 F (37.1 C) (Oral)   Resp 18   Ht 1.93 m (6\' 4" )   Wt 139 kg (306 lb 7 oz)   SpO2 98%   BMI 37.30 kg/m          Active Problems:    Physical debility             Problem: Pain interferes with ability to perform ADL  Goal: Pain at adequate level as identified by patient  Outcome: Progressing

## 2018-06-16 NOTE — Plan of Care (Addendum)
NURSE NOTE SUMMARY     Patient Name: Ned Card   Attending Physician: Georgiana Spinner, *   Primary Care Physician: Md, Out Of Network (Inactive)   Date of Admission:   06/03/2018   Today's date:   06/16/2018 LOS: 13 days   Shift Summary:                                                              Sleeping quietly at present. Medicated for rt groin pain with relief. Staples intact to upper rt groin incision with gauze placed in the fold.     Provider Notifications:      Rapid Response Notifications:  Mobility:              Weight tracking:  Family Dynamic:     Last 3 Weights for the past 72 hrs (Last 3 readings):   Weight   06/15/18 0500 136.7 kg (301 lb 5.9 oz)   06/14/18 0600 138.5 kg (305 lb 5.4 oz)   06/13/18 0515 138.5 kg (305 lb 6.4 oz)       Recent Vitals:  Active Problems:     BP 135/66   Pulse 86   Temp 99.5 F (37.5 C) (Oral)   Resp 18   Ht 1.93 m (6\' 4" )   Wt 136.7 kg (301 lb 5.9 oz)   SpO2 98%   BMI 36.68 kg/m          Active Problems:    Physical debility                   Problem: Moderate/High Fall Risk Score >5  Goal: Patient will remain free of falls  Outcome: Progressing

## 2018-06-16 NOTE — Plan of Care (Addendum)
Problem: Moderate/High Fall Risk Score >5  Goal: Patient will remain free of falls  Outcome: Progressing   06/16/18 2326   High Risk Falls Interventions (Greater than 13)   VH High Risk (Greater than 13) BED ALARM WILL BE ACTIVATED WHEN THE PATEINT IS IN BED WITH SIGNAGE "RESET BED ALARM"       Problem: Pain interferes with ability to perform ADL  Goal: Pain at adequate level as identified by patient  Outcome: Progressing   06/16/18 1328   Goal/Interventions addressed this shift   Pain at adequate level as identified by patient Identify patient comfort function goal;Assess for risk of opioid induced respiratory depression, including snoring/sleep apnea. Alert healthcare team of risk factors identified.;Offer non-pharmacological pain management interventions;Evaluate patient's satisfaction with pain management progress;Assess pain on admission, during daily assessment and/or before any "as needed" intervention(s)       Problem: Compromised Tissue integrity  Goal: Damaged tissue is healing and protected  Outcome: Progressing   06/10/18 1504   Goal/Interventions addressed this shift   Damaged tissue is healing and protected  Monitor/assess Braden scale every shift;Provide wound care per wound care algorithm;Reposition patient every 2 hours and as needed unless able to reposition self;Increase activity as tolerated/progressive mobility;Relieve pressure to bony prominences for patients at moderate and high risk;Avoid shearing injuries;Keep intact skin clean and dry;Use bath wipes, not soap and water, for daily bathing     NURSE NOTE SUMMARY     Patient Name: [name]   Attending Physician: Deirdre Peer, MD   Primary Care Physician: Md, Out Of Network (Inactive)   Date of Admission:   06/03/2018   Today's date:   06/16/2018 LOS: 13 days   Shift Summary:                                                              Patient alert and oriented,  In bed with call bell in reach.  Right groin with 19 intact staples  covered by a gauze.  Medicated for pain at 2138.  Patient has a temperature of 38.4.  Given scheduled Tylenol.  Bed exit activated.    06/17/18  0142  Patient medicated with Ultram for 4/10 pain to right leg.  Will reassess  0300  Blood sugar was 105.  Upon reassessment he was pain free..   Provider Notifications:      Rapid Response Notifications:  Mobility:              Weight tracking:  Family Dynamic:     Last 3 Weights for the past 72 hrs (Last 3 readings):   Weight   06/16/18 0534 139 kg (306 lb 7 oz)   06/15/18 0500 136.7 kg (301 lb 5.9 oz)   06/14/18 0600 138.5 kg (305 lb 5.4 oz)       Recent Vitals:  Active Problems:     BP 144/68   Pulse 86   Temp (!) 101.1 F (38.4 C) (Oral)   Resp 18   Ht 1.93 m (6\' 4" )   Wt 139 kg (306 lb 7 oz)   SpO2 97%   BMI 37.30 kg/m          Active Problems:    Physical debility  Problem: Fluid and Electrolyte Imbalance/ Endocrine  Goal: Adequate hydration  Outcome: Progressing   06/16/18 2326   Goal/Interventions addressed this shift   Adequate hydration Assess mucus membranes, skin color, turgor, perfusion and presence of edema       Problem: Nutrition  Goal: Nutritional intake is adequate  Outcome: Progressing   06/16/18 2326   Goal/Interventions addressed this shift   Nutritional intake is adequate Monitor daily weights;Assist patient with meals/food selection;Allow adequate time for meals

## 2018-06-16 NOTE — Progress Notes (Signed)
MEDICAL PROGRESS NOTE  [facility]      Patient: [name]  Date: 06/16/2018   LOS: 13 Days  Admission Date: 06/03/2018   MRN: [id number]  Attending: Berna Bue, MD     INTERIM SUMMARY   Talha Iser is a 62 y.o. male with PMH significant for CAD s/p stenting in 2008 and 4V CABG 2011, atrial flutter on eliquis, HTN, DM     Patient was hospitalized at [facility] 6/24-06/03/2018 for right periprosthetic hip fracture after a fall.  He had underwent successful elective right total hip replacement on 05/19/2018 by Dr Emeline Darling and had then went home. He was doing well until the morning of admission his right knee gave out and he fell, landing on his right side.  He was found with mid femur fracture adjacent to the distal tip of the femoral prosthesis. He underwent ORIF with cables and placement of a long stem implant on 05/29/2018 Dr Emeline Darling. He received 2 units prbc 6/28. He developed hyponatremia and was placed on fluid restriction and salt tabs. His right knee was injected with kenalog on 7/1 for continued pain.     ASSESSMENT/PLAN     Patient Active Hospital Problem List:   Right periprosthetic mid femur fracture adjacent to distal tip of femoral prosthesis after recent elective right hip replacement 05/19/2018 now s/p ORIF with cables and placement of long stem implant on 05/29/2018 Dr Emeline Darling     Plan:   Had fever spike to 101.5 on 06/06/2018. Started on keflex for wound cellulitis, treated for 1 week 7/5-7/12.  Seen in f/u by Dr Emeline Darling on 7/12 with recommendation for: toe touch weight bearing right leg. Remove remainder of staples in 1 week (7/19). Please keep incision dry/clean, allow proximal incision to air out during day.   Anemia due to acute blood loss    Plan:  S/p transfusion 2 units prbc in hospital 6/28   Hyponatremia    Plan:   Remains asymptomatic. Has some volume overload/edema. Continue with fluid restriction    CAD s/p CAGB 2011    Plan:    Continue coreg, eliquis anticoagulation     Paroxysmal  Atrial flutter    Plan:    Continue coreg, eliquis     Hypertension     Plan:  Currently on amlodipine 10, carvedilol 15.625 bid. Losartan 50    DM II    Plan:  Currently on glipizide 10 bid, januvia 50 bid, metformin 1000 bid. lantus added here     DVT/VTE Prophylaxis  eliquis    Anticipated date of discharge  Jun 20, 2018  Follow up  Ortho Dr Emeline Darling 07/01/2018     Care Plan discussed with nursing, consultants, case manager when possible    SUBJECTIVE     Feels he is improving but still having a lot of pain.  Denies n/v, abd pain, chest pain, shortness of breath.  Stools loose today, had been getting miralax    Therapy Current Functional Levels:   Overall ADL Level of Assist: Mod Assist  Toilet Transfer Level of Assist: Min Assist  Tub/Shower Level of Assist: Not Attempted  Ambulation Distance: 25 feet  Ambulation Level of Assist: Min Assist  Bed/Chair/Wheelchair Level of Assist: Min Assist       MEDICATIONS     Current Facility-Administered Medications   Medication Dose Route Frequency   . acetaminophen  650 mg Oral 4 times per day   . amLODIPine  10 mg Oral Daily   .  apixaban  5 mg Oral Q12H   . bisacodyl  5 mg Oral Daily   . carvedilol  15.625 mg Oral BID Meals   . Digestive Enzymes  3 capsule Oral BID Meals   . glipiZIDE  10 mg Oral BID AC   . insulin glargine  5 Units Subcutaneous QHS   . insulin lispro  2-16 Units Subcutaneous TID AC    And   . insulin lispro  1-9 Units Subcutaneous QHS   . lactobacillus species  50 Billion CFU Oral Daily   . losartan  50 mg Oral Daily   . metFORMIN  1,000 mg Oral BID Meals   . polyethylene glycol  17 g Oral Daily   . SITagliptin  50 mg Oral BID       ROS     Remainder of 10 point ROS as above or otherwise negative    PHYSICAL EXAM     Vitals:    06/16/18 1301   BP: 143/76   Pulse: 90   Resp:    Temp:    SpO2: 98%       Temperature: Temp  Min: 98.8 F (37.1 C)  Max: 99.5 F (37.5 C)  Pulse: Pulse  Min: 78  Max: 91  Respiratory: Resp  Min: 18  Max: 18  Non-Invasive BP: BP  Min:  135/66  Max: 151/90  Pulse Oximetry SpO2  Min: 93 %  Max: 98 %    Intake and Output Summary (Last 24 hours) at Date Time    Intake/Output Summary (Last 24 hours) at 06/16/18 1338  Last data filed at 06/16/18 1219   Gross per 24 hour   Intake              840 ml   Output             1500 ml   Net             -660 ml      Wt Readings from Last 3 Encounters:   06/16/18 139 kg (306 lb 7 oz)   05/26/18 135.5 kg (298 lb 11.6 oz)   05/19/18 136.1 kg (300 lb 0.7 oz)         GEN APPEARANCE: NAD;    HEENT: Conjunctiva Clear; Mucous membranes moist  NECK:  Supple   CVS: RRR, S1, S2; No M/G/R    LUNGS: CTAB; No Wheezes; No Rhonchi: No rales    ABD: Soft; Non-tender, + Normoactive BS    EXT: 1+ edema; Pulses 2+ and intact    SKIN:   Incision with slowly improving erythema   NEURO: No Focal neurological deficits       LABS       Recent Labs  Lab 06/16/18  0545 06/14/18  0630 06/11/18  0515   WBC 7.5  --  8.1   RBC 2.66*  --  2.61*   Hemoglobin 8.7* 8.6* 8.7*   Hematocrit 26.6* 26.3* 26.2*   MCV 100  --  100   PLT CT 421  --  463*         Recent Labs  Lab 06/16/18  0545 06/14/18  0630 06/12/18  0530 06/11/18  0515   Sodium 132* 132* 133* 128*   Potassium 4.2 4.3 4.1 4.0   Chloride 99 100 99 95*   CO2 25 26 28.2 25   BUN 12 14 16 19    Creatinine 0.66* 0.71* 0.67* 0.70*   Glucose 91 116* 99 104*  Calcium 8.9 8.7 8.9 9.2             Invalid input(s): OSMOL                      94 (The above 1 analytes were performed by [facility] [phone])  [address] [address]  )    Microbiology, reviewed and significant for:  No results for input(s): MICRO in the last 24 hours.      RADIOLOGY     Radiological Procedure reviewed.    XR Chest AP Portable   Final Result      No acute airspace disease. Stable minimal vascular prominence but no congestive failure.      No acute airspace disease. No pneumothorax or pleural effusions.      Stable cardiomegaly. Previous CABG and sternotomy. DJD bilateral shoulders.       ReadingStation:[email or web address]          Signed,  Berna Bue, MD  1:38 PM 06/16/2018

## 2018-06-17 DIAGNOSIS — G8918 Other acute postprocedural pain: Secondary | ICD-10-CM

## 2018-06-17 LAB — VH URINALYSIS WITH MICROSCOPIC AND CULTURE IF INDICATED
Bilirubin, UA: NEGATIVE
Blood, UA: NEGATIVE
Glucose, UA: NEGATIVE mg/dL
Ketones UA: NEGATIVE mg/dL
Leukocyte Esterase, UA: NEGATIVE Leu/uL
Nitrite, UA: NEGATIVE
Protein, UR: 30 mg/dL — AB
Urine Specific Gravity: 1.026 (ref 1.001–1.040)
Urobilinogen, UA: 2 mg/dL — AB
pH, Urine: 5 pH (ref 5.0–8.0)

## 2018-06-17 LAB — VH DEXTROSE STICK GLUCOSE
Glucose POCT: 104 mg/dL — ABNORMAL HIGH (ref 71–99)
Glucose POCT: 123 mg/dL — ABNORMAL HIGH (ref 71–99)
Glucose POCT: 102 mg/dL — ABNORMAL HIGH (ref 71–99)
Glucose POCT: 115 mg/dL — ABNORMAL HIGH (ref 71–99)
Glucose POCT: 69 mg/dL — ABNORMAL LOW (ref 71–99)
Glucose POCT: 120 mg/dL — ABNORMAL HIGH (ref 71–99)

## 2018-06-17 MED ORDER — BISACODYL 5 MG PO TBEC
5.00 mg | DELAYED_RELEASE_TABLET | Freq: Every day | ORAL | Status: DC | PRN
Start: 2018-06-17 — End: 2018-06-20

## 2018-06-17 MED ORDER — TIZANIDINE HCL 4 MG PO TABS
4.00 mg | ORAL_TABLET | Freq: Three times a day (TID) | ORAL | Status: DC
Start: 2018-06-17 — End: 2018-06-20
  Filled 2018-06-17 (×4): qty 1

## 2018-06-17 MED ORDER — POLYETHYLENE GLYCOL 3350 17 G PO PACK
17.00 g | PACK | Freq: Every day | ORAL | Status: DC | PRN
Start: 2018-06-17 — End: 2018-06-20

## 2018-06-17 MED ORDER — OXYCODONE HCL 5 MG PO TABS
10.00 mg | ORAL_TABLET | ORAL | Status: DC | PRN
Start: 2018-06-17 — End: 2018-06-20
  Administered 2018-06-17 – 2018-06-20 (×12): 10 mg via ORAL
  Filled 2018-06-17 (×12): qty 2

## 2018-06-17 MED ORDER — OXYCODONE HCL 5 MG PO TABS
5.00 mg | ORAL_TABLET | ORAL | Status: DC | PRN
Start: 2018-06-17 — End: 2018-06-20

## 2018-06-17 NOTE — Progress Notes (Signed)
Nutrition Therapy  Nutrition Screen    Patient Information:     Name:[name]   Age: 62 y.o.   Sex: male     MRN: [id number]    Reason For Screen:     Length of Stay      Nutrition Assessment:   S/p hip fx with successful elective Rt total hip replacement 05/19/18 then had fall when Rt knee gave out with femur fx s/p ORIF 05/29/18. Developed hyponatremia, placed on fluid restriction. Na level improving, currently 124    Seen multiple meal rounds, lengthy d/w pt 7/14. Tolerating diet and Fluid Restriction with FR now decreased to 1200 ml/ day.  Good oral intake with 100% of meals recorded per meal intake record.  D/w pt oral instructions of 1200 ml FR.    Pt states there is a balancing act between limit fluid d/t low sodium levels and drinking adequate fluid to help with bowel motility while on pain meds. LBM 7/14.    No complaints.  Reports therapy is going well. Feels like he is finally making good progress and hopes to be able to stay    Appears more than adequately nourished. Tolerating diet with 100% intake of meals. Pt states he is watching fluid intake carefully to stay within 1200 ml limit.      Nutrition Risk Level: Low      No further recommendations at this time.  RD to follow per policy, nutritional status change or MD Consult      Gillermo Murdoch, RD  06/17/2018 3:19 PM

## 2018-06-17 NOTE — Progress Notes (Signed)
[facility]  Occupational Therapy  Progress Note    Patient Name: [name], [name]         MRN:   [id number]    Department:  REHABILITATION UNIT        Room:  3307/3307-A  Date:   06/17/2018    Medical Diagnosis     Encounter for other specified aftercare [Z51.89]  S/P ORIF (open reduction internal fixation) fracture [Z96.7, Z87.81]    Patient Active Problem List   Diagnosis   . Osteoarthritis of right hip   . Femur fracture, right   . CAD (coronary artery disease)   . AF (atrial fibrillation)   . DM (diabetes mellitus)   . HTN (hypertension)   . Physical debility       Past Medical/Surgical History     Past Medical History:   Diagnosis Date   . Atrial flutter    . CAD (coronary artery disease)    . Carpal tunnel syndrome     H/O   . DM2 (diabetes mellitus, type 2)    . ED (erectile dysfunction)    . Encounter for blood transfusion    . Hypertension    . MI (myocardial infarction) '08, '11   . OA (osteoarthritis)    . Psoriasis       Past Surgical History:   Procedure Laterality Date   . ARTHROPLASTY, HIP, TOTAL, ANTERIOR APPROACH, C ARM Right 05/19/2018    Procedure: ARTHROPLASTY, HIP, TOTAL, ANTERIOR APPROACH, C ARM;  Surgeon: Len Childs, MD;  [facility];  Service: Orthopedics;  Laterality: Right;  RIGHT TOTAL HIP REPLACEMENT, ASI    . ARTHROPLASTY, HIP, TOTAL, REVISION, ANTERIOR APPROACH, C ARM Right 05/29/2018    Procedure: ARTHROPLASTY, HIP, TOTAL, REVISION, ANTERIOR APPROACH, C ARM;  Surgeon: Len Childs, MD;  Location: [facility];  Service: Orthopedics;  Laterality: Right;  RT REVISION PROSTETIC TOTAL HIP    . BICEPS TENDON REPAIR Right    . CARDIOVERSION     . CORONARY ARTERY BYPASS GRAFT  2011   . CYST REMOVAL      RIGHT CHEEK   . INGUINAL HERNIA REPAIR Right     AS CHILD   . KNEE ARTHROTOMY Left 1975   . ORIF, RADIUS, DISTAL (WRIST) Right    . SINUS SURGERY         Flow Sheet Data      06/16/18 1200   Documentation type:    Type  Progress   Rehab Therapy Precautions   Weight Bearing Status RLE TTWB   Total Hip Replacement no external rotation  (hyperextension)   Other Precautions [place]   Subjective   Subjective Statement "I don't think I have the attention span to learn using the toilet aide right now due to my pain"   Patient Goal "Get well as fast as possible"   Prior Level of Function   Prior level of function Independent with ADLs;Ambulates independently   Baseline Activity Level Community ambulation   Work Real Games developer for self   Driving independent   Home Living Arrangements   Living Arrangements Alone   Type of Home Apartment   Home Layout One level   Type of  Shower/Tub Walk-in shower   Type of  Toilet Raised   Bathroom Accessibility Accessible   DME Currently at Home Reacher;Sock Aid;Long-Handled [place]   Home Living - Notes / Comments pt reports will stay w/ friend who lives locally  before going back to [place]. Pt reports he will stay in one-level basement with access to full BR.   Metallurgist TBE   Home Management    Home Management Comments sister helps with house cleaning and laundry weekly   Cognition   Behavior calm cooperative;attentive   Cognition Comments alert, orientedx3   Comprehension   Comprehension Type  Both   Devices/Techniques Glasses   Comprehension Prompts Repetition   Comprehension Score  7-Patient understands complex and abstract conversations   Goal Score Comprehension 7-Independent   Expression    Expression Type Both   Expression Score 7-Patient expresses complex and abstract ideas clearly independently   Goal Score Expression 7-Independent   Problem solving    Problem Solving Score 7b-Patient solves complex problems to complete tasks   Goal Score Problem Solving 7-Independent   Memory   Memory Score 7-Patient recognizes familiar people/things   Goal Score Memory 7-Independent   Neuro Status   Motor Planning intact   Coordination intact   Hand Dominance  right handed   Motor control   RUE WFL   RUE Finger to Nose Test  [facility]   RUE Finger Opposition  WFL   RUE Grip Strength  WFL   LUE WFL   LUE Finger to Nose Test  [facility]   LUE Finger Opposition  WFL   LUE Grip Strength  WFL   Sensory   Proprioception WFL   Vision - Complex Assessment   Ocular Range of Motion WFL   Head Position WDL   Tracking Able to track stimulus in all quads without difficulty   Glasses yes   Acuity Able to read clock/calendar on wall without difficulty;Able to read employee name badge without difficulty;Able to read normal print without difficulty   Vision Comments pt reports no changes in visual acuity,, visual tracking, and blurred vision at this time.    Skin Integrity   Skin Integrity RLE edema; Redness and bruising around surgical stitches   Edema    Edema scale +2 Deeper pit less than 5mm   Pain Assessment   Pain Assessment Numeric Scale (0-10)   Pain Score 8   POSS Score 1   Pain Location Hip   Pain Orientation Right   Pain Descriptors Aching;Burning;Constant;Spasm   Pain Frequency Continuous   Effect of Pain on Daily Activities moderate   Patient's Stated Comfort Functional Goal 2   Pain Intervention(s) Cold applied;Repositioned   Multiple Pain Sites Yes   Pain Comments pt reports pain is a constant barrier to ADLs and functional mobility. Pain limits clinic activity and AE use for LB dressing.    Pain 2   Pain Score 2 6   POSS Score 2 1   Pain Location 2 Groin   Pain Orientation 2 Inner;Right   Pain Descriptors 2 Tightness   Pain Frequency 2 Continuous   Pain Onset 2 On-going   Patient's Stated Comfort Functional Goal 2 2   Pain Intervention(s) 2 Cold applied;Repositioned   Gross ROM   Gross ROM WFL   Neck/Trunk ROM WFL   Right Upper Extremity ROM WFL   Left Upper Extremity ROM WFL   Tone   Tone WFL   Gross Strength   Neck/Trunk Strength 5/5   Right Upper Extremity Strength 5/5   Left Upper Extremity Strength 5/5   Eating   Eating Device/Technique Regular untensil   Eating Score 7-Patient  completes entire meal independently without any devices or modified diets   Goal  Eating Score 7-Independent   Grooming    Hands Patient   Face Patient   Hair Patient   Oral Patient   Shave/Makeup  Patient   Grooming Score 7-Patient completes all desired grooming tasks independently without any devices   Goal Score Grooming  7   Bathing    Chest Patient   Right Arm Patient   Left Arm  Patient   Abdomen Patient   Front Perineal Area Patient   Back Perineal Areas (includes buttocks) Helper   Left Upper Leg Patient   Right Upper Leg Patient   Left Lower Leg (include foot) Helper   Right Lower Leg (include foot) Helper   Device/Techniques Seated at sink;Standing at sink   Bathing Score  3-Patient completes 50-74% of desired bathing tasks   Goal Score Bathing  6-Mod I   Dressing- Upper Body   Shirt 1 Right Arm  Patient   Shirt 1 Left Arm  Patient   Shirt 1 Overhead Patient   Shirt 1 Arrange in Back Patient   Device/Techniques Seated at sink   Dressing Score 7-Patient completes all desired upper body dressing tasks independently without any devices  (w/c level)   Goal Dressing Score  7-Independent   Dressing- Lower Body   Underwear Right Leg Patient   Underwear Left Leg Patient   Underwear Arrange Over Hips Helper   Pants Right Leg Patient   Pants Left Leg Patient   Pants Arrange Over Hips Helper   Right Sock Helper   Left Shoe Patient   Device/Technique  Reacher;Long handled shoe horn;Seated at sink;Standing at sink   Dressing Score  3-Patient completes 50-74% of desired lower body dressing tasks   Goal Score Dressing  6-Mod1   Toileting    Pants Down Patient   Hygiene Helper   Pants Up  Helper   Device/Technique Safety frame (balance);Grab bar (balance);Walker (balance)   Toileting Score  2-Patient completes 25-49% of toileting tasks   Goal Score Toileting 6-Mod1   Toilet Transfers - Ambulatory   Ambulatory Device/Technique Toilet Transfer FWW   Toilet Transfer Score  0-Activity did not occur upon evaluation   Goal Score  Toilet Transfer 6-Mod1   Toilet Transfers - Wheelchair   Approaches Bed or Chair Patient   Locks Brakes Patient   Lifts Foot Rests Helper   Device/Techniques-Wheelchair FWW   Toilet Transfer Score  4-Patient requires steadying assistance of only one person to complete toilet transfer;4a-Patient requires assistance only to lift only one leg off of the leg rest   Goal Score Toilet Transfer  6-Mod I   Tub/Shower Transfer- Ambulatory   Tub/Shower Transfer Score  0-Activity did not occur upon evaluation   Tub/Shower Transfers - Designer, jewellery Score  0 - Activity did not occur upon evaluation   Balance   Static Sitting Balance Independent   Dyanamic Sitting Balance Independent   Static Standing Balance Minimal Assist   Dynamic Standing Balance Minimal Assist   Balance Comments pt's standing balance improving w/ TTWB status. Repetition of sit-to-stand increases pain and decreases standing tolerance.    Leisure/Routine Participation   Leisure Interest target shooting, cooking   Patient Education   Patient Education DME education (toilet aide); pain management   Assessment   Strengths independent PLOF;active participant;good family support;cognition intact;owns equipment;good UE control;good UE strength;good sitting balance;vision intact;sensation intact   Limitations impaired standing balance;impaired LB strength;pain limiting activity   Prognosis Good for OT Goals   Assessment  Pt met 1/3 short term  goals this week. Pt's progress is limited by R hip and groin pain, and decreased standing endurance. Pt reports he wants initiate using AE for toilet hygiene and bathing (specifically back perineal areas) in addition to his pre-owned AE for LB dressing. However, pt has not carried over AE practices in his room during ADL session consistently due to reported pain and fatigue. Pt reports he wants to use the AE and practice so he knows how to use AE at home. Pt requires Max A for toileting, Mod A with LB dressing  and bathing; Min A toilet transfers for steadying and assisting R leg on/off leg rest. Pt will be going to friend's home before returning home to one-level apartment in [place]. Friend's house is in [place] and pt reports he would be staying in the basement of the home, where he can safely access without using stairs; access to full BR. Pt is expected to benefit from OT services to facilitate improved functional performance with self care using AE PRN, transfers, light home mgmt toward returning home to [place].   OT Goals   Previous Short Term Goals (1 week) 1) Pt to complete toilet transfer with Min A. = MET 2) Pt to complete LB dressing with Min A using AE PRN. = NOT MET 3) Pt to complete toileting activity with Min A. = NOT MET   New Short Term Goals STG = LTG d/t ELOS   Long Term Goals By d/c pt to be at mod I level with self care, functional transfers, light home management with AE AT DME prn.   OT Plan   OT Frequency  2-3 X/day, 1.5 hours/day, 5-6 days/week   Treatment/Interventions ADL Training;Improving Balance/Tolerance;DME Training;Patient/Family Education;Techniques to Improve Skin/Joint Integrity;Techniques for Energy Building surveyor;Improving UB Strength/Endurance;Positioning   Discharge Recommendation Home with home health OT   DME Recommended for Discharge Grab Bars in Shower;Transfer Tub Bench   Estimated Length of Stay (Days) 3   Discharge Date 06/20/18       Rita Ohara, MS, OTR/L

## 2018-06-17 NOTE — Progress Notes (Signed)
[facility]  Physical Therapy  Progress Note      Patient Name: DEMARI, [name]         MRN:   [id number]    Department:  REHABILITATION UNIT        Room:  3307/3307-A  Date:   06/17/2018    Medical Diagnosis     Encounter for other specified aftercare [Z51.89]  S/P ORIF (open reduction internal fixation) fracture [Z96.7, Z87.81]    Patient Active Problem List   Diagnosis   . Osteoarthritis of right hip   . Femur fracture, right   . CAD (coronary artery disease)   . AF (atrial fibrillation)   . DM (diabetes mellitus)   . HTN (hypertension)   . Physical debility       Past Medical/Surgical History     Past Medical History:   Diagnosis Date   . Atrial flutter    . CAD (coronary artery disease)    . Carpal tunnel syndrome     H/O   . DM2 (diabetes mellitus, type 2)    . ED (erectile dysfunction)    . Encounter for blood transfusion    . Hypertension    . MI (myocardial infarction) '08, '11   . OA (osteoarthritis)    . Psoriasis       Past Surgical History:   Procedure Laterality Date   . ARTHROPLASTY, HIP, TOTAL, ANTERIOR APPROACH, C ARM Right 05/19/2018    Procedure: ARTHROPLASTY, HIP, TOTAL, ANTERIOR APPROACH, C ARM;  Surgeon: Len Childs, MD;  [facility];  Service: Orthopedics;  Laterality: Right;  RIGHT TOTAL HIP REPLACEMENT, ASI    . ARTHROPLASTY, HIP, TOTAL, REVISION, ANTERIOR APPROACH, C ARM Right 05/29/2018    Procedure: ARTHROPLASTY, HIP, TOTAL, REVISION, ANTERIOR APPROACH, C ARM;  Surgeon: Len Childs, MD;  [facility];  Service: Orthopedics;  Laterality: Right;  RT REVISION PROSTETIC TOTAL HIP    . BICEPS TENDON REPAIR Right    . CARDIOVERSION     . CORONARY ARTERY BYPASS GRAFT  2011   . CYST REMOVAL      RIGHT CHEEK   . INGUINAL HERNIA REPAIR Right     AS CHILD   . KNEE ARTHROTOMY Left 1975   . ORIF, RADIUS, DISTAL (WRIST) Right    . SINUS SURGERY         Flow Sheet Data      06/17/18 0846   Documentation type   Documentation  Progress   Rehab Therapy Precautions   Weight Bearing Status RLE TTWB   Total Hip Replacement (avoid combination of hip hyperextension and ER)   Other Precautions Falls   Prior Level of Function   Prior Level of Function Independent with ADLs;Ambulates independently  (Uses SPC for long distances)   Prior Assistive Device Used Yes   Assistive Device Single point cane;Front wheel walker   Baseline Activity Level Community ambulation   Employment FT   DME Currently at [employer] aid;Reacher;Single point cane;Front wheel walker  (shower chair, raised toilet seat)   Home Living Arrangements   Living Arrangements Alone  (sister and her boyfriend available in the evenings)   Type of Home Apartment  (1st floor)   Home Layout One level;Performs ADL's on one level   Bathroom Accessibility Accessible via wheelchair;Accessible via walker   Home Living Comments handicap accessible    Subjective   Subjective Statement "I feel like therapy has helped with the swelling, my mobility, and with  walking. Overall it has made a lot of difference."   Patient Goal "I would like to work on bathroom, showering, shaving, and getting home."   Therapy Vital Signs   Blood Pressure 130/81   Heart Rate 84   O2 (RA)   Cognition   Behavior calm;cooperative   Following Commands Follows all commands and directions without difficulty   Neuro Status   Coordination intact   Hand Dominance right handed   Pain Assessment   Pain Assessment Numeric Scale (0-10)   Pain Score 3   Pain Location Groin   Pain Orientation Right   Pain Descriptors Aching   Pain Frequency Increases with movement   Effect of Pain on Daily Activities moderate   Skin Integrity   Skin Integrity RLE edema and bruising around surgical site.    Sensory    Light touch inconsistent results below the ankles bilaterally   5.07 Monofilament  0/5 bilaterally    Proprioception 5/5 Great toe find bilaterally   Gross ROM   Right Upper Extremity ROM WFL   Left Upper Extremity ROM WFL   R Knee Flexion  0-140 93   Left Lower Extremity ROM WFL   Gross ROM Comments Patient is leaning to the L and lifting R ischial tuberosity during knee flexion   PROM RLE (degrees)   R Hip ABduction 0-45 15   Gross Strength   Right Upper Extremity Strength WFL   Left Upper Extremity Strength WFL   L Hip Flexion 4+/5   L Knee Flexion 5/5   L Knee Extension 5/5   L Ankle Dorsiflexion 5/5   Functional Mobility   Roll to Left UTA   Roll to Right UTA   Lying to Sitting Minimal Assist   Sitting to Lying Minimal Assist   Seated Lateral Scooting Supervision   Sit to Stand Minimal Assist   Stand to Sit Supervision   Functional Mobility Comments Patient requires assistance moving RLE in and out of bed.    Transfer Bed/Wheelchair/Chair - Ambulatory   Device/Technique Stand Pivot;FWW   Bed Transfer Score  4-Patient requires steadying assistance of only one person to complete transfer to and from bed, chair, or wheelchair;4a-Patient requires assistance only to lift only one leg in and/or out of the bed   Goal score (Ambulatory) transfers 6-Mod I   Other Transfers   Floor transfer  (not performed)   Car transfer  (not performed)   Balance   Sitting - Static Good   Sitting - Dynamic Good   Standing - Static Fair   Standing - Dynamic Fair   Balance Comments Patient is able to alternate UE support while standing stand for 1 minute. Tolerance to standing balance continues to be challenged by increased fatigue and pain with activity.   Locomotion Ambulation   Walking Distance 1- equals less than 85ft   Goal Walking Distance  2- equals 50-182ft   Walking Score 1-Patient ambulates less than 50 feet   Walking Goal Score  6-Mod I   Ambulation Level of Assist/Device Minimal Assist;with front-wheeled walker   Ambulation Distance (Feet) 25 Feet   Gait Speed (M/Sec) (unable to assess at this time due to seated rest breaks)   Gait Pattern 3 point   Ambulation Comments Patient was able to increase ambulation distance while maintaining toe touch WB status.    Product/process development scientist Distance 3 - greater than or equal to 150 ft  Goal Wheelchair Distance  3 - greater than or equal to 150 ft   Wheelchair Score 6-Patient propels wheelchair a minimum of 150 feet independently, safely   Wheelchair Goal Score 7-Independent   Wheelchair Parts Management Patient locks (R) brakes;Patient locks (L) brakes;Patient lifts (L) foot rest;Helper lifts (R) foot rest;Helper removes (R) arm rest;Helper removes (L) arm rest   Locomotion Other   Curb Not performed   Ramp Not performed   Stair Management    Stairs score 0-Activity did not occur upon evaluation   Patient Education   Patient Education Patient was educated on progress made so far in therapy, the importance of continuing to move R LE, and goals for physical therapy going forward.    Assessment   Strengths independent PLOF;active participant;owns equipment;good UE control;good UE strength;vision intact   Limitations impaired activity tolerance;impaired sensation;impaired LB strength;impaired standing balance;pain limiting activity   Prognosis Good;With continued PT status post acute discharge   Barriers to discharge uncertain how often help will be available at home   Progress Progressing toward goals   Assessment [name] is currently presenting with the above listed impairments. He has been participating in therapy twice a day. He has completed 2/4 STGs and 0/4 LTGs at this time and continues to make progress toward completing the rest of his goals. Therapy continues to be delayed at times due to patient's pain, especially when he attempts more standing or walking activities. He has made progress with mobility, strength, and balance as evident by the following information. The patient is now able to perform lateral scooting with supervision, which has improved from Min A last week. He still requires min A for safely moving his R LE for  the rest of bed mobility. He now able to walk 25 feet with Min A, FWW, and toe touch WB status, which has improved from 17 feet with Min A and NWB status last week. During ambulation, the patient is now consistently compliant with WB status, which has improved from needing verbal cues last week. His standing balance has improved to allow for him to stand with a FWW for one minute with alternating UE support, which has improved from 20 seconds with a FWW last week. Patient will continue to benefit from skilled physical therapy during admission to address the above listed impairments and allow for safe d/c home.      Goals   Previous Short Term Goals To be completed by 06/17/18; Patient will be able to complete all aspects of bed mobility with supervision PARTIALLY MET 2) Patient will be able to SPT with supervision and FWW NOT MET 3) Patient will be able to ambulate 25 feet with Min A and FWW. MET 4) Patient will be able to stand with alternating UE support for 30 seconds to demonstrate ability to perform dynamic standing ALDs. MET   New Short Term Goals STG = LTG due to length of stay being less that 1 week.   Long Term Goals To be completed by d/c; Patient will be able to complete all aspects of bed mobility with Mod I. 2) Patient will be able to perform SPT with FWW and Mod I. 3) Patient will be able to ambulate 50 feet with FWW and Mod I. 4) Patient will be able to complete a car transfer with FWW and supervision to allow for safe d/c home.    Plan   Frequency 5-6 days per week, twice a day, 1.5 hours a day   Treatment/Interventions Exercise;Gait training;Neuromuscular  re-education;Functional transfer training;LE strengthening/ROM;Endurance training;Equipment eval/education;Bed mobility   Discharge Recommendation Home with home health PT   Discharge Recommendation Home with home health PT   DME Recommended for Discharge Front wheel walker;Wheelchair-manual   Estimated Length of Stay (Days) 3   Discharge Date  06/20/18     Migdalia Dk, SPT  ______________________________________________________________________    I have reviewed and agree with the documentation authored by my student of the evaluation/ treatment/education/care plan delivered during my direct supervision.     Co-Signed by:  Macario Carls, PT  ______________________________________________________________________

## 2018-06-17 NOTE — Progress Notes (Signed)
MEDICAL PROGRESS NOTE  [facility]      Patient: [name]  Date: 06/17/2018   LOS: 14 Days  Admission Date: 06/03/2018   MRN: [id number]  Attending: Berna Bue, MD     INTERIM SUMMARY   [name] is a 62 y.o. male with PMH significant for CAD s/p stenting in 2008 and 4V CABG 2011, atrial flutter on eliquis, HTN, DM     Patient was hospitalized at [facility] 6/24-06/03/2018 for right periprosthetic hip fracture after a fall.  He had underwent successful elective right total hip replacement on 05/19/2018 by Dr Emeline Darling and had then went home. He was doing well until the morning of admission his right knee gave out and he fell, landing on his right side.  He was found with mid femur fracture adjacent to the distal tip of the femoral prosthesis. He underwent ORIF with cables and placement of a long stem implant on 05/29/2018 Dr Emeline Darling. He received 2 units prbc 6/28. He developed hyponatremia and was placed on fluid restriction and salt tabs. His right knee was injected with kenalog on 7/1 for continued pain.     ASSESSMENT/PLAN     Patient Active Hospital Problem List:   Right periprosthetic mid femur fracture adjacent to distal tip of femoral prosthesis after recent elective right hip replacement 05/19/2018 now s/p ORIF with cables and placement of long stem implant on 05/29/2018 Dr Emeline Darling     Plan:   Had fever spike to 101.5 on 06/06/2018. Started on keflex for wound cellulitis, treated for 1 week 7/5-7/12.  Seen in f/u by Dr Emeline Darling on 7/12 with recommendation for: toe touch weight bearing right leg. Remove remainder of staples in 1 week (7/19). Please keep incision dry/clean, allow proximal incision to air out during day.   Fever 06/16/2018    Plan:  Had fever last night, currently remains nontoxic appearing. Check ua, check cxr/labs in am.    Anemia due to acute blood loss    Plan:  S/p transfusion 2 units prbc in hospital 6/28   Hyponatremia    Plan:   Remains asymptomatic. Has some volume overload/edema.  Continue with fluid restriction    CAD s/p CAGB 2011    Plan:    Continue coreg, eliquis anticoagulation     Paroxysmal Atrial flutter    Plan:    Continue coreg, eliquis     Hypertension     Plan:  Currently on amlodipine 10, carvedilol 15.625 bid. Losartan 50    DM II    Plan:  Currently on glipizide 10 bid, januvia 50 bid, metformin 1000 bid. lantus added here     DVT/VTE Prophylaxis  eliquis    Anticipated date of discharge  Jun 20, 2018  Follow up  Ortho Dr Emeline Darling 07/01/2018     Care Plan discussed with nursing, consultants, case manager when possible    SUBJECTIVE     Feels better today.  Stool a little loose but took miralax because he did not want to get constipated with the narcotics. Denies chest pain, shortness of breath, cough/sputum, n/v.  No dysuria     Therapy Current Functional Levels:   Overall ADL Level of Assist: Mod Assist  Toilet Transfer Level of Assist: Min Assist  Tub/Shower Level of Assist: Not Attempted  Ambulation Distance: 15 feet  Ambulation Level of Assist: Min Assist  Bed/Chair/Wheelchair Level of Assist: Min Assist       MEDICATIONS     Current Facility-Administered Medications  Medication Dose Route Frequency   . acetaminophen  650 mg Oral 4 times per day   . amLODIPine  10 mg Oral Daily   . apixaban  5 mg Oral Q12H   . bisacodyl  5 mg Oral Daily   . carvedilol  15.625 mg Oral BID Meals   . Digestive Enzymes  3 capsule Oral BID Meals   . glipiZIDE  10 mg Oral BID AC   . insulin glargine  5 Units Subcutaneous QHS   . insulin lispro  2-16 Units Subcutaneous TID AC    And   . insulin lispro  1-9 Units Subcutaneous QHS   . lactobacillus species  50 Billion CFU Oral Daily   . losartan  50 mg Oral Daily   . metFORMIN  1,000 mg Oral BID Meals   . SITagliptin  50 mg Oral BID   . tiZANidine  4 mg Oral TID       ROS     Remainder of 10 point ROS as above or otherwise negative    PHYSICAL EXAM     Vitals:    06/17/18 1709   BP: 127/72   Pulse: 92   Resp:    Temp:    SpO2:        Temperature: Temp   Min: 98.8 F (37.1 C)  Max: 101.1 F (38.4 C)  Pulse: Pulse  Min: 83  Max: 92  Respiratory: Resp  Min: 16  Max: 18  Non-Invasive BP: BP  Min: 127/72  Max: 157/80  Pulse Oximetry SpO2  Min: 94 %  Max: 97 %    Intake and Output Summary (Last 24 hours) at Date Time    Intake/Output Summary (Last 24 hours) at 06/17/18 1715  Last data filed at 06/17/18 0800   Gross per 24 hour   Intake              240 ml   Output              620 ml   Net             -380 ml      Wt Readings from Last 3 Encounters:   06/17/18 137.2 kg (302 lb 7.5 oz)   05/26/18 135.5 kg (298 lb 11.6 oz)   05/19/18 136.1 kg (300 lb 0.7 oz)         GEN APPEARANCE: NAD;    HEENT: Conjunctiva Clear; Mucous membranes moist  NECK:  Supple   CVS: RRR, S1, S2; No M/G/R    LUNGS: CTAB; No Wheezes; No Rhonchi: No rales    ABD: Soft; Non-tender, + Normoactive BS    EXT: 1+ edema; Pulses 2+ and intact    SKIN:   Incision with slowly improving erythema   NEURO: No Focal neurological deficits       LABS       Recent Labs  Lab 06/16/18  0545 06/14/18  0630 06/11/18  0515   WBC 7.5  --  8.1   RBC 2.66*  --  2.61*   Hemoglobin 8.7* 8.6* 8.7*   Hematocrit 26.6* 26.3* 26.2*   MCV 100  --  100   PLT CT 421  --  463*         Recent Labs  Lab 06/16/18  0545 06/14/18  0630 06/12/18  0530 06/11/18  0515   Sodium 132* 132* 133* 128*   Potassium 4.2 4.3 4.1 4.0   Chloride 99 100 99 95*  CO2 25 26 28.2 25   BUN 12 14 16 19    Creatinine 0.66* 0.71* 0.67* 0.70*   Glucose 91 116* 99 104*   Calcium 8.9 8.7 8.9 9.2             Invalid input(s): OSMOL                      (!) 115 (The above 1 analytes were performed by [facility] [phone]) [address],[place] [address] )    Microbiology, reviewed and significant for:  No results for input(s): MICRO in the last 24 hours.      RADIOLOGY     Radiological Procedure reviewed.    XR Chest AP Portable   Final Result      No acute airspace disease. Stable minimal vascular prominence but no congestive failure.       No acute airspace disease. No pneumothorax or pleural effusions.      Stable cardiomegaly. Previous CABG and sternotomy. DJD bilateral shoulders.      ReadingStation:[email or web address]          Signed,  Berna Bue, MD  5:15 PM 06/17/2018

## 2018-06-17 NOTE — Plan of Care (Signed)
Problem: Occupational Therapy  Goal: By discharge, patient will perform self-care at the patient's highest functional potential.  See OT evaluation/note for goals.  Outcome: Progressing

## 2018-06-17 NOTE — Progress Notes (Signed)
PHYSICAL MEDICINE AND REHABILITATION PROGRESS NOTE    Date: 06/17/18 Time: 9:30 PM  Patient Name: [name], [name]    CC:  On consult for functional and rehabilitative needs; general pain management; and assessment of medical complexities and co-morbidities as barriers to rehabilitation.      SUBJECTIVE     She is seen by bedside today.  He continues to report pain especially during therapies.  Pain regimen discussed with the patient.  Last bowel movement yesterday as per patient.  Had a loose bowel movement.    Therapy Current Functional Levels:   Overall ADL Level of Assist: Mod Assist  Toilet Transfer Level of Assist: Min Assist  Tub/Shower Level of Assist: Not Attempted  Ambulation Distance: 15   Ambulation Level of Assist: Min Assist  Bed/Chair/Wheelchair Level of Assist: Min Assist       REVIEW OF SYSTEM     Constitutional:  Denies fever/chills/unexplained weight changes  HEENT:  Denies headache, vision changes  CV:        Denies chest pain/palpitations  Pulm:    Denies shortness of breath  GI:      Denies nausea/vomitting/abd pain; Regular bowel patterns  GU:    Denies bloody/burning urination/trouble controlling bladder  Neuro:   Denies weakness;  Denies paresthesia  Musculoskeletal:   Denies new falls/injuries  Psych: mood euthymic    7/16    MEDICATIONS   SCHEDULED MEDICATIONS  Current Facility-Administered Medications   Medication Dose Route Frequency   . acetaminophen  650 mg Oral 4 times per day   . amLODIPine  10 mg Oral Daily   . apixaban  5 mg Oral Q12H   . carvedilol  15.625 mg Oral BID Meals   . Digestive Enzymes  3 capsule Oral BID Meals   . glipiZIDE  10 mg Oral BID AC   . insulin glargine  5 Units Subcutaneous QHS   . insulin lispro  2-16 Units Subcutaneous TID AC    And   . insulin lispro  1-9 Units Subcutaneous QHS   . lactobacillus species  50 Billion CFU Oral Daily   . losartan  50 mg Oral Daily   . metFORMIN  1,000 mg Oral BID Meals   . SITagliptin  50 mg Oral BID   . tiZANidine  4 mg  Oral TID       AS NEEDED MEDICATIONS  acetaminophen, bisacodyl, diphenhydrAMINE, diphenhydrAMINE, gabapentin, NSG Communication: Glucose POCT order (AC, HS) **AND** insulin lispro **AND** insulin lispro **AND** insulin lispro, naloxone, ondansetron **OR** ondansetron, ondansetron, oxyCODONE, oxyCODONE, polyethylene glycol, promethazine **OR** promethazine **OR** promethazine, traMADol    PHYSICAL EXAMINATION   VITAL SIGNS  Vitals:    06/17/18 2005   BP: 136/76   Pulse: 85   Resp: 20   Temp: 99.9 F (37.7 C)   SpO2: 96%       EXAMINATION 7/16  GENERAL:                  Alert and oriented X 3. NAD LUNGS:                       Clear to auscultation, no wheezing, no rhonchi  HEART:                       Irregular  Rhythm without murmur, gallops, or rubs  ABDOMEN:      Soft, nontender, bowel sounds present. No organomegaly. No rebound tenderness  EXTREMITIES:  No pain, cyanosis, or edema. Neg Homan's on left.  Significant RLE edema, hip to toes, mildly improved.  Right anterior groin dressing with scant serous drainage proximally, less erythema and appears pinkish around the incision.  Lower lateral incision c/d/i.   NEUROLOGIC:           Alert, oriented, non-focal findings  SKIN:                           Intact  MOTOR:                      Functional strength throughout left.  RLE limited by pain.  Dorsiflexion/plantarflexion 5/5.     SENS:                         Intact bilat to PP/LT, proprioception    OBJECTIVE DATA   Laboratory  Results     Procedure Component Value Units Date/Time    Dextrose Stick Glucose [[id number]]  (Abnormal) Collected:  06/17/18 2111    Specimen:  Blood Updated:  06/17/18 2127     Glucose, POCT 69 (L) mg/dL     Urinalysis w Microscopic and Culture if Indicated [[id number]]  (Abnormal) Collected:  06/17/18 1608    Specimen:  Urine, Random Updated:  06/17/18 2012     Color, UA Yellow     Clarity, UA Turbid (A)     Specific Gravity, UR 1.026     pH, Urine 5.0 pH      Protein, UR 30 (A)  mg/dL      Glucose, UA Negative mg/dL      Ketones UA Negative mg/dL      Bilirubin, UA Negative     Blood, UA Negative     Nitrite, UA Negative     Urobilinogen, UA 2.0 (A) mg/dL      Leukocyte Esterase, UA Negative Leu/uL      UR Micro Performed     Calcium Oxalate Crystals, UA Occasional (A) /hpf      Amorphous, UA Moderate (A) /uL     Dextrose Stick Glucose [[id number]]  (Abnormal) Collected:  06/17/18 1655    Specimen:  Blood Updated:  06/17/18 1711     Glucose, POCT 115 (H) mg/dL     Dextrose Stick Glucose [[id number]]  (Abnormal) Collected:  06/17/18 1206    Specimen:  Blood Updated:  06/17/18 1225     Glucose, POCT 102 (H) mg/dL     Dextrose Stick Glucose [[id number]]  (Abnormal) Collected:  06/17/18 0721    Specimen:  Blood Updated:  06/17/18 0738     Glucose, POCT 123 (H) mg/dL     Dextrose Stick Glucose [[id number]]  (Abnormal) Collected:  06/17/18 0309    Specimen:  Blood Updated:  06/17/18 0325     Glucose, POCT 104 (H) mg/dL     Dextrose Stick Glucose [[id number]]  (Abnormal) Collected:  06/16/18 2129    Specimen:  Blood Updated:  06/16/18 2156     Glucose, POCT 163 (H) mg/dL           Micro  Microbiology Results     None            Radiology  XR Chest AP Portable   Final Result      No acute airspace disease. Stable minimal vascular prominence but no congestive failure.      No acute airspace disease. No pneumothorax or  pleural effusions.      Stable cardiomegaly. Previous CABG and sternotomy. DJD bilateral shoulders.      ReadingStation:[email or web address]      XR Chest AP Portable    (Results Pending)         ASSESSMENT   SUMMARY STATEMENT:  The patient states that he came to [facility] for an elective right total hip replacement.  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  He was recovering well from the hip replacement when he had a fall landing on the right knee and suffered a periprosthetic hip  fracture.  Patient underwent surgical repair and is currently nonweightbearing.  He does state that knee pain and generalized pain are somewhat limiting.  Last bowel movement was yesterday.  Post op required 4 Units PRBC and developed hyponatremia.      Diagnosis:     Active Hospital Problems    Diagnosis   . Physical debility   1.  S/p right periprosthetic hip fracture after THR.  NWB.              -f/u Dr. Emeline Darling 7/12 due to pain, drainage                gait / Impaired mobility, ADLs / Fall risks / Debility - OT /PT, Fall precautions    Continue with current rehabilitative and medical management plan as discussed above.   Patient is participating in therapy, working towards goals. Refer to therapy notes in EHR for details of current progress.    Coordination of care:   Rehabilitation goal setting and medical management plan discussed with therapy, nursing, and hospitalist team in AM meetings  Medication list and pertinent lab work reviewed.   Medical management as per hospitalist  Patient to continue acute rehabilitation.     Randall Goal: 7/19 as may need to change to sliding board.  Will see , HH PT/OT/RN    2.  DVT risks - eliquis  3.  Pain - Initially started on long acting and scheduled Zanaflex qhs. Pt declined. Switched back to Roxi 5-10mg  q3 prn, Zanaflex prn.  IM d/c'd neurontin.  Has increased use of Tramadol.   Used 60 mg 7/8, 50 mg 7/9 and 40 mg 7/10.  Decrease tylenol. 50 mg 7/11. 7/16-scheduled tizanidine 4 mg 3 times daily.  Discussed with patient that hopefully this should reduce use of the Roxicodone.  Patient agreeable with plan.  He is still consistently using the Roxicodone every 3-4 hours on average upon review of past few days. Change Roxi to q4 prn. Monitor. Monitor Tramadol use, can wean as well.     4.   PVRs, bowel program. + Loose bowels, miralax changed to prn 7/16.  5.  DM-per IM  6.  Afib/flutter - on eliquis  7.  HTN/CAD-coreg, norvasc  8.  SIADH - improving,  fluid restrictions and  lasix per IM  9.  Post op anemia - monitor  10.  Wound care, dressing changes q shift. On Keflex 7/5-7/12. . Instructed to start with abx and current care by Dr. Ellyn Hack office on 7/8. Seen by Dr. Emeline Darling 7/12 now with TTWB. Keep incision area dry clean, allow proximal incision to air out during the day.  Staple removal  06/20/2018.  11.  Patient did have a fever 101.1 06/16/18. He appears non toxic.   UA, cxr, labs per IM. Monitor incision.  PLAN   Patient is participating in therapy, working towards goals.   Refer to therapy notes in EHR for details of current progress.  Patient to continue acute rehabilitation  Continue with current medical management plan as per Internal Medicine hospitalist attending team.

## 2018-06-17 NOTE — Progress Notes (Signed)
Had TC with Dr. Silvestre Moment re: d/c plan.  Dr. Silvestre Moment is in agreement with [name] on 06/20/18.  Dr. Silvestre Moment is more than happy to provide pt with a place to live and follow him at home health level.  SW also let pt and Dr. Silvestre Moment know that pt's w/c will need to be special ordered so pt will receive a 22x18 w/c until his 22/20 w/c comes in.  [facility] will swap out w/c's at that time. There are no d/c issues or concerns at this time.

## 2018-06-18 ENCOUNTER — Inpatient Hospital Stay: Payer: BC Managed Care – PPO

## 2018-06-18 LAB — BASIC METABOLIC PANEL
Anion Gap: 13 mMol/L (ref 7.0–18.0)
BUN / Creatinine Ratio: 18.5 Ratio (ref 10.0–30.0)
BUN: 12 mg/dL (ref 7–22)
CO2: 24 mMol/L (ref 20.0–30.0)
Calcium: 8.7 mg/dL (ref 8.5–10.5)
Chloride: 101 mMol/L (ref 98–110)
Creatinine: 0.65 mg/dL — ABNORMAL LOW (ref 0.80–1.30)
EGFR: 105 mL/min/{1.73_m2} (ref 60–150)
Glucose: 78 mg/dL (ref 71–99)
Osmolality Calc: 267 mOsm/kg — ABNORMAL LOW (ref 275–300)
Potassium: 4 mMol/L (ref 3.5–5.3)
Sodium: 134 mMol/L — ABNORMAL LOW (ref 136–147)

## 2018-06-18 LAB — CBC AND DIFFERENTIAL
Basophils %: 0.8 % (ref 0.0–3.0)
Basophils Absolute: 0.1 10*3/uL (ref 0.0–0.3)
Eosinophils %: 0.8 % (ref 0.0–7.0)
Eosinophils Absolute: 0.1 10*3/uL (ref 0.0–0.8)
Hematocrit: 27.4 % — ABNORMAL LOW (ref 39.0–52.5)
Hemoglobin: 8.7 gm/dL — ABNORMAL LOW (ref 13.0–17.5)
Lymphocytes Absolute: 1 10*3/uL (ref 0.6–5.1)
Lymphocytes: 14.9 % — ABNORMAL LOW (ref 15.0–46.0)
MCH: 32 pg (ref 28–35)
MCHC: 32 gm/dL (ref 32–36)
MCV: 101 fL — ABNORMAL HIGH (ref 80–100)
MPV: 7 fL (ref 6.0–10.0)
Monocytes Absolute: 0.7 10*3/uL (ref 0.1–1.7)
Monocytes: 9.5 % (ref 3.0–15.0)
Neutrophils %: 74 % (ref 42.0–78.0)
Neutrophils Absolute: 5.1 10*3/uL (ref 1.7–8.6)
PLT CT: 420 10*3/uL (ref 130–440)
RBC: 2.72 10*6/uL — ABNORMAL LOW (ref 4.00–5.70)
RDW: 17.2 % — ABNORMAL HIGH (ref 11.0–14.0)
WBC: 7 10*3/uL (ref 4.0–11.0)

## 2018-06-18 LAB — VH DEXTROSE STICK GLUCOSE
Glucose POCT: 108 mg/dL — ABNORMAL HIGH (ref 71–99)
Glucose POCT: 123 mg/dL — ABNORMAL HIGH (ref 71–99)
Glucose POCT: 72 mg/dL (ref 71–99)
Glucose POCT: 87 mg/dL (ref 71–99)
Glucose POCT: 117 mg/dL — ABNORMAL HIGH (ref 71–99)

## 2018-06-18 MED ORDER — CARVEDILOL 6.25 MG PO TABS
12.50 mg | ORAL_TABLET | Freq: Two times a day (BID) | ORAL | Status: DC
Start: 2018-06-18 — End: 2018-06-20
  Administered 2018-06-18 – 2018-06-20 (×5): 12.5 mg via ORAL
  Filled 2018-06-18 (×5): qty 2

## 2018-06-18 MED ORDER — VH POTASSIUM CHLORIDE CRYS ER 10 MEQ PO TBCR (WRAP)
10.00 meq | EXTENDED_RELEASE_TABLET | Freq: Every day | ORAL | Status: DC
Start: 2018-06-19 — End: 2018-06-20
  Administered 2018-06-19 – 2018-06-20 (×2): 10 meq via ORAL
  Filled 2018-06-18 (×2): qty 1

## 2018-06-18 MED ORDER — FUROSEMIDE 40 MG PO TABS
40.00 mg | ORAL_TABLET | Freq: Every day | ORAL | Status: DC
Start: 2018-06-19 — End: 2018-06-20
  Administered 2018-06-19 – 2018-06-20 (×2): 40 mg via ORAL
  Filled 2018-06-18 (×2): qty 1

## 2018-06-18 NOTE — Patient Care Conference (Signed)
PHYSICAL MEDICINE AND REHABILITATION   INTERDISCIPLINARY   TEAM CONFERENCE NOTE    Patient Name:  [name], [name]        MRN:   [id number]    Room:   3307/3307-A  Department:    REHABILITATION UNIT     Date of Team Conference: 06/19/2018    Time of Team Conference: 09:46      TEAM MEMBERS PRESENT     Case Manager / Social Work: Marlowe Aschoff, SW  Nursing: Francie Massing, RN   Nursing: Edwin Dada, RN   Physical Therapy: Luellen Pucker, PT  Occupational Therapy: Rita Ohara, OT    MEDICAL     Extensive discussion re: patient's ongoing medical management issues, as well as medical and rehabilitative functional progress, plan of care, and discharge planning.    Active Hospital Problems    Diagnosis   . Physical debility     None    Current Facility-Administered Medications   Medication Dose Route Frequency   . acetaminophen  650 mg Oral 4 times per day   . amLODIPine  10 mg Oral Daily   . apixaban  5 mg Oral Q12H   . carvedilol  12.5 mg Oral BID Meals   . Digestive Enzymes  3 capsule Oral BID Meals   . [START ON 06/19/2018] furosemide  40 mg Oral Daily   . glipiZIDE  10 mg Oral BID AC   . insulin lispro  2-16 Units Subcutaneous TID AC    And   . insulin lispro  1-9 Units Subcutaneous QHS   . lactobacillus species  50 Billion CFU Oral Daily   . losartan  50 mg Oral Daily   . metFORMIN  1,000 mg Oral BID Meals   . [START ON 06/19/2018] potassium chloride  10 mEq Oral Daily   . SITagliptin  50 mg Oral BID   . tiZANidine  4 mg Oral TID     acetaminophen, bisacodyl, diphenhydrAMINE, diphenhydrAMINE, gabapentin, NSG Communication: Glucose POCT order (AC, HS) **AND** insulin lispro **AND** insulin lispro **AND** insulin lispro, naloxone, ondansetron **OR** ondansetron, ondansetron, oxyCODONE, oxyCODONE, polyethylene glycol, promethazine **OR** promethazine **OR** promethazine, traMADol    NURSING     Pain: Pain controlled with medication  Medical staff adjusting pain medication  DVT prophylaxis: Apixaban / Eliquis  BLADDER  FUNCTION  Bladder management:5 Supervision   BOWEL FUNCTION  Bowel management:6 Modified Independent   SKIN: Staples removed / Steristrips placed  DIABETES MELLITUS Patient has established history of diabetes Diabetic management ongoing - refer to medical records for details ANTICOAGULATION Anticoagulation teaching has been completed    PHYSICAL THERAPY     Bed/Chair/Wheelchair Transfer: 4 Minimal Assistance     Wheelchair Mobility: 6 Modified Independent   Distance: 150 feet at Modified Independent  level    Ambulation: 1 Total Assistance / Dependence   Assistive Device: FWW  Distance: 30 feet at Minimal Assistance  level    Stairs: Not assessed / Inability to participate  Number of Steps: 0 at n/a level  Assistive Device: n/a    OCCUPATIONAL THERAPY     Eating: 7 Independent   Grooming: 7 Independent   Bathing: 4 Minimal Assist  Dressing Upper Body: 5 Supervision   Dressing Lower Body: 4 Minimal Assist  Toileting: 3 Moderate Assist  Toilet Transfer: 4 Minimal Assist  Tub/Shower Transfer: Not assessed / Inability to participate    Comprehension: 6 Modified Independent   Expression: 7 Independent   Problem Solving: 7 Independent   Memory: 7 Independent  OT Recommendations: None currently    SPEECH LANGUAGE PATHOLOGY     Consultation not clinically indicated    NEUROPSYCHOLOGY     NEUROPSYCHOLOGY  Consult not clinically indicated    RESPIRATORY THERAPY     RESPIRATORY CONSULTATION  Not initiated / not clinically indicated    REHABILITATION GOALS     Occupational Therapy Goal: Long Term Goals: By d/c pt to be at mod I level with self care, functional transfers, light home management with AE AT DME prn. [ Limited progress towards goal, goal still valid ]    Physical Therapy Goal: Long Term Goals: To be completed by d/c; Patient will be able to complete all aspects of bed mobility with Mod I. 2) Patient will be able to perform SPT with FWW and Mod I. 3) Patient will be able to ambulate 50 feet with FWW and Mod I. 4)  Patient will be able to complete a car transfer with FWW and supervision to allow for safe d/c home.  [ Progressing towards goal, goal still valid ]    Nursing Goal: Patient will prepare for home discharge and accept changes in medication management   [ Progressing towards goal, goal still valid ]    PROGRESS TOWARDS GOALS     Is the patient making progress towards goals? Yes    VALIDITY OF TREATMENT PLAN     Treatment plan still valid? Yes    BARRIERS TO REHABILITATION     BARRIERS TO REHABILITATIVE GOALS  Fall risks with high potentials for recurrences  Weight bearing status restrictions  Cardiovascular risks / Cardiopulmonary risks / Vascular risks  Neurologic risks / Seizure risks  Deficits such as visual / hearing impairments / limb or joint deficiencies affecting rehabilitaiton  BARRIERS TO DISCHARGE  Unavailability of home and family support  Weight bearing status restrictions  Home accessibility / Home safety    CASE MANAGEMENT / SOCIAL SERVICE / DISCHARGE PLAN     Anticipated date of discharge:  06/20/2018    DISCHARGE DISPOSITION     Home with Home Health Nursing, with Home Health Occupational Therapy and with Home Health Physical Therapy    EQUIPMENT RECOMMENDED     Occupational Therapy: Safety frame for toilet, Grab bar(s) in shower and Leg lifter    Physical Therapy: Front wheel walker and Wheelchair - manual    REHABILITATION FOLLOW-UP INSTRUCTIONS     Physical Medicine & Rehabilitation service outpatient follow-up as per clinical indication, to be determined at time of discharge  Primary Care Physician  Orthopedist   Prescriptions for medications and follow-up testing as per Hospitalist Team    RATIONAL FOR NEED OF FURTHER INTENSIVE REHAB     Patient continues to express a willingness to participate and demonstrates reasonable potential for functional improvement from intensive and comprehensive inpatient Rehabilitation under the direction of Physical Medicine and Rehab as well as medical management  under the supervision of Internal Medicine hospitalist team. Continue acute rehabilitation.     Physician's electronic signature attests the presence of above team members during medical and functional discussions during this weekly team conference meeting as stated above.    10 min spent in the coordination of care for this patient with the medical and rehab team

## 2018-06-18 NOTE — Plan of Care (Signed)
Problem: Diabetes: Glucose Imbalance  Goal: Blood glucose stable at established goal  Outcome: Progressing   06/18/18 0214   Goal/Interventions addressed this shift   Blood glucose stable at established goal  Monitor lab values;Monitor intake and output. Notify LIP if urine output is < 30 mL/hour.;Follow fluid restrictions/IV/PO parameters;Assess for hypoglycemia /hyperglycemia;Monitor/assess vital signs     NURSE NOTE SUMMARY     Patient Name: [name]   Attending Physician: Deirdre Peer, MD   Primary Care Physician: Md, Out Of Network (Inactive)   Date of Admission:   06/03/2018   Today's date:   06/18/2018 LOS: 15 days   Shift Summary:                                                              Patient alert and oriented,  In bed with call bell in reach.  Right groin with 19 intact staples covered by a gauze.  Medicated for pain x 1 w/ oxy 10 mg this shift so far.  Given scheduled Tylenol.  Requested PRN gabapentin this evening. Blood sugar noted to be 69 at bedtime, no insulin given, given orange juice, peanut butter & crackers given as snack, re-check was 120. Safety maintained. Will continue to monitor.    Provider Notifications:      Rapid Response Notifications:  Mobility:              Weight tracking:  Family Dynamic:     Last 3 Weights for the past 72 hrs (Last 3 readings):   Weight   06/17/18 0521 137.2 kg (302 lb 7.5 oz)   06/16/18 0534 139 kg (306 lb 7 oz)   06/15/18 0500 136.7 kg (301 lb 5.9 oz)       Recent Vitals:  Active Problems:     BP 136/76   Pulse 85   Temp 99.9 F (37.7 C) (Oral)   Resp 20   Ht 1.93 m (6\' 4" )   Wt 137.2 kg (302 lb 7.5 oz)   SpO2 96%   BMI 36.82 kg/m          Active Problems:    Physical debility

## 2018-06-18 NOTE — Progress Notes (Signed)
MEDICAL PROGRESS NOTE  [facility] CENTER      Patient: [name]  Date: 06/18/2018   LOS: 15 Days  Admission Date: 06/03/2018   MRN: [id number]  Attending: Berna Bue, MD     INTERIM SUMMARY   [name] is a 62 y.o. male with PMH significant for CAD s/p stenting in 2008 and 4V CABG 2011, atrial flutter on eliquis, HTN, DM     Patient was hospitalized at [facility] 6/24-06/03/2018 for right periprosthetic hip fracture after a fall.  He had underwent successful elective right total hip replacement on 05/19/2018 by Dr Emeline Darling and had then went home. He was doing well until the morning of admission his right knee gave out and he fell, landing on his right side.  He was found with mid femur fracture adjacent to the distal tip of the femoral prosthesis. He underwent ORIF with cables and placement of a long stem implant on 05/29/2018 Dr Emeline Darling. He received 2 units prbc 6/28. He developed hyponatremia and was placed on fluid restriction and salt tabs. His right knee was injected with kenalog on 7/1 for continued pain.     ASSESSMENT/PLAN     Patient Active Hospital Problem List:   Right periprosthetic mid femur fracture adjacent to distal tip of femoral prosthesis after recent elective right hip replacement 05/19/2018 now s/p ORIF with cables and placement of long stem implant on 05/29/2018 Dr Emeline Darling     Plan:   Had fever spike to 101.5 on 06/06/2018. Started on keflex for wound cellulitis, treated for 1 week 7/5-7/12.  Seen in f/u by Dr Emeline Darling on 7/12 with recommendation for: toe touch weight bearing right leg. Remove remainder of staples in 1 week (7/19). Please keep incision dry/clean, allow proximal incision to air out during day.   Fever 06/16/2018    Plan:  Had fever 7/15, currently remains nontoxic appearing. UA neg. CXR negative. No leukocytosis. Continue to monitor closely     Anemia due to acute blood loss    Plan:  S/p transfusion 2 units prbc in hospital 6/28   Hyponatremia    Plan:   Remains asymptomatic. Has  some volume overload/edema. Continue with fluid restriction.  Will add lasix temporarily for significant edema/overload     CAD s/p CAGB 2011    Plan:    Continue coreg, eliquis anticoagulation     Paroxysmal Atrial flutter    Plan:    Continue coreg, eliquis     Hypertension     Plan:  Currently on amlodipine 10, carvedilol 15.625 bid. Losartan 50.  Add lasix for volume overload     DM II    Plan:  Currently on glipizide 10 bid, januvia 50 bid, metformin 1000 bid. lantus added here   Will d/c lantus as has not always been getting it and had hypoglycemia     DVT/VTE Prophylaxis  eliquis    Anticipated date of discharge  Jun 20, 2018  Follow up  Ortho Dr Emeline Darling 07/01/2018     Care Plan discussed with nursing, consultants, case manager when possible    SUBJECTIVE     Feels he did better yesterday but has some concerns about his strength and ability to control his right leg.      Therapy Current Functional Levels:   Overall ADL Level of Assist: Mod Assist  Toilet Transfer Level of Assist: Min Assist  Tub/Shower Level of Assist: Not Attempted  Ambulation Distance: 15 feet  Ambulation Level of Assist: Min Assist  Bed/Chair/Wheelchair Level of Assist: Min Assist       MEDICATIONS     Current Facility-Administered Medications   Medication Dose Route Frequency   . acetaminophen  650 mg Oral 4 times per day   . amLODIPine  10 mg Oral Daily   . apixaban  5 mg Oral Q12H   . carvedilol  15.625 mg Oral BID Meals   . Digestive Enzymes  3 capsule Oral BID Meals   . glipiZIDE  10 mg Oral BID AC   . insulin glargine  5 Units Subcutaneous QHS   . insulin lispro  2-16 Units Subcutaneous TID AC    And   . insulin lispro  1-9 Units Subcutaneous QHS   . lactobacillus species  50 Billion CFU Oral Daily   . losartan  50 mg Oral Daily   . metFORMIN  1,000 mg Oral BID Meals   . SITagliptin  50 mg Oral BID   . tiZANidine  4 mg Oral TID       ROS     Remainder of 10 point ROS as above or otherwise negative    PHYSICAL EXAM     Vitals:    06/18/18  0741   BP: 147/78   Pulse: 97   Resp:    Temp:    SpO2:        Temperature: Temp  Min: 98.8 F (37.1 C)  Max: 99.9 F (37.7 C)  Pulse: Pulse  Min: 83  Max: 97  Respiratory: Resp  Min: 16  Max: 20  Non-Invasive BP: BP  Min: 127/72  Max: 151/83  Pulse Oximetry SpO2  Min: 96 %  Max: 97 %    Intake and Output Summary (Last 24 hours) at Date Time    Intake/Output Summary (Last 24 hours) at 06/18/18 1102  Last data filed at 06/18/18 0900   Gross per 24 hour   Intake              420 ml   Output             1250 ml   Net             -830 ml      Wt Readings from Last 3 Encounters:   06/18/18 137.8 kg (303 lb 12.7 oz)   05/26/18 135.5 kg (298 lb 11.6 oz)   05/19/18 136.1 kg (300 lb 0.7 oz)         GEN APPEARANCE: NAD;    HEENT: Conjunctiva Clear; Mucous membranes moist  NECK:  Supple   CVS: RRR, S1, S2; No M/G/R    LUNGS: CTAB; No Wheezes; No Rhonchi: No rales    ABD: Soft; Non-tender, + Normoactive BS    EXT: 1+ edema; Pulses 2+ and intact    SKIN:   Incision with slowly improving erythema   NEURO: No Focal neurological deficits       LABS       Recent Labs  Lab 06/18/18  0455 06/16/18  0545 06/14/18  0630   WBC 7.0 7.5  --    RBC 2.72* 2.66*  --    Hemoglobin 8.7* 8.7* 8.6*   Hematocrit 27.4* 26.6* 26.3*   MCV 101* 100  --    PLT CT 420 421  --          Recent Labs  Lab 06/18/18  0455 06/16/18  0545 06/14/18  0630 06/12/18  0530   Sodium 134* 132* 132* 133*   Potassium 4.0  4.2 4.3 4.1   Chloride 101 99 100 99   CO2 24.0 25 26 28.2   BUN 12 12 14 16    Creatinine 0.65* 0.66* 0.71* 0.67*   Glucose 78 91 116* 99   Calcium 8.7 8.9 8.7 8.9             Invalid input(s): OSMOL                      (!) 123 (The above 1 analytes were performed by [facility] [phone]) [address],[place] [address] )    Microbiology, reviewed and significant for:  No results for input(s): MICRO in the last 24 hours.      RADIOLOGY     Radiological Procedure reviewed.    XR Chest AP Portable   Final Result   Negative  for pneumonia. No acute findings.      ReadingStation:[email or web address]      XR Chest AP Portable   Final Result      No acute airspace disease. Stable minimal vascular prominence but no congestive failure.      No acute airspace disease. No pneumothorax or pleural effusions.      Stable cardiomegaly. Previous CABG and sternotomy. DJD bilateral shoulders.      ReadingStation:[email or web address]          Signed,  Berna Bue, MD  11:02 AM 06/18/2018

## 2018-06-18 NOTE — Progress Notes (Signed)
Met with pt today secondary to his request of possibly extending his stay with [place] beyond the scheduled [place] date of 06-20-18. Informed pt that in order for his insurance to consider additional time, he would need to demonstrate his willingness to improve his functional level especially with OT secondary to his reluctance to use AE for toileting and ADLs. Pt weight bearing status changed and PT was of the opinion that he could benefit. Informed the pt we would discuss his request and if we have justification for an extension, we can ask but of course could not guarantee that his insurance would approve. Pt voiced his understanding of this plan and agreement that we would discuss this further after team tomorrow. Will pursue [facility] plans accordingly.

## 2018-06-19 DIAGNOSIS — R2689 Other abnormalities of gait and mobility: Secondary | ICD-10-CM

## 2018-06-19 LAB — VH DEXTROSE STICK GLUCOSE
Glucose POCT: 78 mg/dL (ref 71–99)
Glucose POCT: 106 mg/dL — ABNORMAL HIGH (ref 71–99)
Glucose POCT: 87 mg/dL (ref 71–99)
Glucose POCT: 115 mg/dL — ABNORMAL HIGH (ref 71–99)
Glucose POCT: 110 mg/dL — ABNORMAL HIGH (ref 71–99)

## 2018-06-19 NOTE — Plan of Care (Addendum)
NURSE NOTE SUMMARY     Patient Name: [name]   Attending Physician: Deirdre Peer, MD   Primary Care Physician: Md, Out Of Network (Inactive)   Date of Admission:   06/03/2018   Today's date:   06/19/2018 LOS: 16 days   Shift Summary:                                                               Pt. Alternates the ultram in-between the oxycodone every 4 hours to keep his right hip & leg pain in a tolerable range, so he can participate to his highest ability  in therapy.   Provider Notifications:      Rapid Response Notifications:  Mobility:              Weight tracking:  Family Dynamic:     Last 3 Weights for the past 72 hrs (Last 3 readings):   Weight   06/19/18 0700 138.7 kg (305 lb 12.5 oz)   06/18/18 0434 137.8 kg (303 lb 12.7 oz)   06/17/18 0521 137.2 kg (302 lb 7.5 oz)       Recent Vitals:  Active Problems:     BP 128/69   Pulse 86   Temp 98.6 F (37 C) (Oral)   Resp 18   Ht 1.93 m (6\' 4" )   Wt 138.7 kg (305 lb 12.5 oz)   SpO2 95%   BMI 37.22 kg/m          Active Problems:    Physical debility               Problem: Compromised Tissue integrity  Goal: Damaged tissue is healing and protected  I cleansed pt's right upper thigh/documented as groin area with safe-clens & applied a new 4x4 gauze; did not ahere gauze.  The proximal incision line remains with staples patent  With a scant of serous drainage, moderate redness remains. Pt. Is afebrile.  His right hip upper leg incision has healed; well approximated w/o staples or sutures, no redness or edema.

## 2018-06-19 NOTE — Plan of Care (Signed)
Problem: Diabetes: Glucose Imbalance  Goal: Blood glucose stable at established goal  Outcome: Progressing   06/19/18 0051   Goal/Interventions addressed this shift   Blood glucose stable at established goal  Monitor lab values;Follow fluid restrictions/IV/PO parameters;Include patient/family in decisions related to nutrition/dietary selections;Assess for hypoglycemia /hyperglycemia     NURSE NOTE SUMMARY     Patient Name: EINAR, NOLASCO   Attending Physician: Deirdre Peer, MD   Primary Care Physician: Md, Out Of Network (Inactive)   Date of Admission:   06/03/2018   Today's date:   06/19/2018 LOS: 16 days   Shift Summary:                                                              Patient alert and oriented, In bed with call bell in reach. Right groin with 19 intact staples covered by a gauze. Medicated for pain multiple times throughout the night. Given scheduled Tylenol. Blood sugar noted to be 117 at bedtime. Safety maintained. Will continue to monitor.    Provider Notifications:      Rapid Response Notifications:  Mobility:              Weight tracking:  Family Dynamic:     Last 3 Weights for the past 72 hrs (Last 3 readings):   Weight   06/18/18 0434 137.8 kg (303 lb 12.7 oz)   06/17/18 0521 137.2 kg (302 lb 7.5 oz)   06/16/18 0534 139 kg (306 lb 7 oz)       Recent Vitals:  Active Problems:     BP 125/78   Pulse 86   Temp 99.3 F (37.4 C) (Oral)   Resp 18   Ht 1.93 m (6\' 4" )   Wt 137.8 kg (303 lb 12.7 oz)   SpO2 97%   BMI 36.98 kg/m          Active Problems:    Physical debility

## 2018-06-19 NOTE — Plan of Care (Addendum)
NURSE NOTE SUMMARY     Patient Name: [name], [name]   Attending Physician: Deirdre Peer, MD   Primary Care Physician: Md, Out Of Network (Inactive)   Date of Admission:   06/03/2018   Today's date:   06/19/2018 LOS: 16 days   Shift Summary:                                                              Patient resting in short intervals t/o shift. Patient medicated for pain to right leg t/o shift. Fluid restriction maintained this shift. Right upper leg dressing done this shift, area reddened and warm to touch.No new complaints offered. No acute distress noted.   Provider Notifications:      Rapid Response Notifications:  Mobility:              Weight tracking:  Family Dynamic:     Last 3 Weights for the past 72 hrs (Last 3 readings):   Weight   06/19/18 0700 138.7 kg (305 lb 12.5 oz)   06/18/18 0434 137.8 kg (303 lb 12.7 oz)   06/17/18 0521 137.2 kg (302 lb 7.5 oz)       Recent Vitals:  Active Problems:     BP 126/70   Pulse 86   Temp 99.5 F (37.5 C) (Oral)   Resp 20   Ht 1.93 m (6\' 4" )   Wt 138.7 kg (305 lb 12.5 oz)   SpO2 98%   BMI 37.22 kg/m          Active Problems:    Physical debility       Problem: Pain interferes with ability to perform ADL  Goal: Pain at adequate level as identified by patient  Outcome: Progressing   06/19/18 2358   Goal/Interventions addressed this shift   Pain at adequate level as identified by patient Identify patient comfort function goal;Reassess pain within 30-60 minutes of any procedure/intervention, per Pain Assessment, Intervention, Reassessment (AIR) Cycle;Evaluate if patient comfort function goal is met;Offer non-pharmacological pain management interventions

## 2018-06-19 NOTE — Progress Notes (Signed)
MEDICAL PROGRESS NOTE  [facility] CENTER      Patient: [name]  Date: 06/19/2018   LOS: 16 Days  Admission Date: 06/03/2018   MRN: [id number]  Attending: Berna Bue, MD     INTERIM SUMMARY   [name] is a 62 y.o. male with PMH significant for CAD s/p stenting in 2008 and 4V CABG 2011, atrial flutter on eliquis, HTN, DM     Patient was hospitalized at [facility] 6/24-06/03/2018 for right periprosthetic hip fracture after a fall.  He had underwent successful elective right total hip replacement on 05/19/2018 by Dr Emeline Darling and had then went home. He was doing well until the morning of admission his right knee gave out and he fell, landing on his right side.  He was found with mid femur fracture adjacent to the distal tip of the femoral prosthesis. He underwent ORIF with cables and placement of a long stem implant on 05/29/2018 Dr Emeline Darling. He received 2 units prbc 6/28. He developed hyponatremia and was placed on fluid restriction and salt tabs. His right knee was injected with kenalog on 7/1 for continued pain.     ASSESSMENT/PLAN     Patient Active Hospital Problem List:   Right periprosthetic mid femur fracture adjacent to distal tip of femoral prosthesis after recent elective right hip replacement 05/19/2018 now s/p ORIF with cables and placement of long stem implant on 05/29/2018 Dr Emeline Darling     Plan:   Had fever spike to 101.5 on 06/06/2018. Started on keflex for wound cellulitis, treated for 1 week 7/5-7/12.  Seen in f/u by Dr Emeline Darling on 7/12 with recommendation for: toe touch weight bearing right leg. Remove remainder of staples in 1 week (7/19). Please keep incision dry/clean, allow proximal incision to air out during day.   Fever 06/16/2018    Plan:  Had fever 7/15, currently remains nontoxic appearing. UA neg. CXR negative. No leukocytosis. Continue to monitor closely     Anemia due to acute blood loss    Plan:  S/p transfusion 2 units prbc in hospital 6/28   Hyponatremia    Plan:   Remains asymptomatic. Has  some volume overload/edema. Continue with fluid restriction.  On lasix temporarily for significant edema/overload     CAD s/p CAGB 2011    Plan:    Continue coreg, eliquis anticoagulation     Paroxysmal Atrial flutter    Plan:    Continue coreg, eliquis     Hypertension     Plan:  Currently on amlodipine 10, carvedilol 12.5 bid. Losartan 50, lasix 40   DM II    Plan:  Currently on glipizide 10 bid, januvia 50 bid, metformin 1000 bid. Initially lantus added here, now off as has not always been getting it and had hypoglycemia   Sugars stable on home regimen     DVT/VTE Prophylaxis  eliquis    Anticipated date of discharge  Jun 20, 2018  Follow up  Ortho Dr Emeline Darling 07/01/2018     Care Plan discussed with nursing, consultants, case manager when possible    SUBJECTIVE     Slowly feeling better.  Denies chest pain, shortness of breath, n/v, abd pain       Therapy Current Functional Levels:   Overall ADL Level of Assist: (P) Supervision  Toilet Transfer Level of Assist: (P) Min Assist  Tub/Shower Level of Assist: (P) Not Attempted  Ambulation Distance: 10 feet  Ambulation Level of Assist: Min Assist  Bed/Chair/Wheelchair Level of Assist:  Min Assist       MEDICATIONS     Current Facility-Administered Medications   Medication Dose Route Frequency   . acetaminophen  650 mg Oral 4 times per day   . amLODIPine  10 mg Oral Daily   . apixaban  5 mg Oral Q12H   . carvedilol  12.5 mg Oral BID Meals   . Digestive Enzymes  3 capsule Oral BID Meals   . furosemide  40 mg Oral Daily   . glipiZIDE  10 mg Oral BID AC   . insulin lispro  2-16 Units Subcutaneous TID AC    And   . insulin lispro  1-9 Units Subcutaneous QHS   . lactobacillus species  50 Billion CFU Oral Daily   . losartan  50 mg Oral Daily   . metFORMIN  1,000 mg Oral BID Meals   . potassium chloride  10 mEq Oral Daily   . SITagliptin  50 mg Oral BID   . tiZANidine  4 mg Oral TID       ROS     Remainder of 10 point ROS as above or otherwise negative    PHYSICAL EXAM     Vitals:     06/19/18 1229   BP: 121/67   Pulse: 84   Resp: 18   Temp: 98.6 F (37 C)   SpO2: 95%       Temperature: Temp  Min: 98.6 F (37 C)  Max: 99.3 F (37.4 C)  Pulse: Pulse  Min: 83  Max: 90  Respiratory: Resp  Min: 16  Max: 18  Non-Invasive BP: BP  Min: 121/67  Max: 152/83  Pulse Oximetry SpO2  Min: 95 %  Max: 98 %    Intake and Output Summary (Last 24 hours) at Date Time    Intake/Output Summary (Last 24 hours) at 06/19/18 1353  Last data filed at 06/19/18 1300   Gross per 24 hour   Intake             1080 ml   Output             2650 ml   Net            -1570 ml      Wt Readings from Last 3 Encounters:   06/19/18 138.7 kg (305 lb 12.5 oz)   05/26/18 135.5 kg (298 lb 11.6 oz)   05/19/18 136.1 kg (300 lb 0.7 oz)         GEN APPEARANCE: NAD;    HEENT: Conjunctiva Clear; Mucous membranes moist  NECK:  Supple   CVS: RRR, S1, S2; No M/G/R    LUNGS: CTAB; No Wheezes; No Rhonchi: No rales    ABD: Soft; Non-tender, + Normoactive BS    EXT: 1+ edema; Pulses 2+ and intact    SKIN:   Incision with slowly improving erythema   NEURO: No Focal neurological deficits       LABS       Recent Labs  Lab 06/18/18  0455 06/16/18  0545 06/14/18  0630   WBC 7.0 7.5  --    RBC 2.72* 2.66*  --    Hemoglobin 8.7* 8.7* 8.6*   Hematocrit 27.4* 26.6* 26.3*   MCV 101* 100  --    PLT CT 420 421  --          Recent Labs  Lab 06/18/18  0455 06/16/18  0545 06/14/18  0630   Sodium 134* 132* 132*  Potassium 4.0 4.2 4.3   Chloride 101 99 100   CO2 24.0 25 26   BUN 12 12 14    Creatinine 0.65* 0.66* 0.71*   Glucose 78 91 116*   Calcium 8.7 8.9 8.7             Invalid input(s): OSMOL                      87 (The above 1 analytes were performed by [facility] [phone])  [address],[place] [address]  )    Microbiology, reviewed and significant for:  No results for input(s): MICRO in the last 24 hours.      RADIOLOGY     Radiological Procedure reviewed.    XR Chest AP Portable   Final Result   Negative for pneumonia. No acute  findings.      ReadingStation:[email or web address]      XR Chest AP Portable   Final Result      No acute airspace disease. Stable minimal vascular prominence but no congestive failure.      No acute airspace disease. No pneumothorax or pleural effusions.      Stable cardiomegaly. Previous CABG and sternotomy. DJD bilateral shoulders.      ReadingStation:[email or web address]          Signed,  Berna Bue, MD  1:53 PM 06/19/2018

## 2018-06-19 NOTE — Progress Notes (Addendum)
Date of team conference:  06/19/2018  See patient care conference with the above date.  Reviewed team report with   Pt.  [name] date remains for 06/20/2018. Pt will need HH PT/OT/Nsg/Aide at [facility]. [name] was asking for extension of stay at [facility] secondary to barriers entering the home.  There are 2 steps to enter home and pt states that a ramp can be build but not in one day. SW explained to pt that insurance auth was through today with [place] on the 19th.  Also informed pt that insurance would not extend stay due to barriers entering the home.  [name] asked if I could call his caregiver and make sure that there are people lined up to get him in the home tomorrow.  SW called Dr. Silvestre Moment and left voice message.  Pt has no other [place] issues or concerns at this time.

## 2018-06-19 NOTE — Progress Notes (Signed)
PHYSICAL MEDICINE AND REHABILITATION PROGRESS NOTE    Date: 06/19/18 Time: 5:19 PM  Patient Name: [name], [name]    CC:  On consult for functional and rehabilitative needs; general pain management; and assessment of medical complexities and co-morbidities as barriers to rehabilitation.      SUBJECTIVE     Patient continues to express adequate pain control.  He states that his every 3 hours Dilaudid was changed to every 4 hours which affects his timing of the medication.  Again explained to the patient the need to start the medication as he will not be going home on that amount and his Ortho surgeon is unlikely to continue the medications.  Patient voices understanding.    Therapy Current Functional Levels:   Overall ADL Level of Assist: Supervision  Toilet Transfer Level of Assist: Min Assist  Tub/Shower Level of Assist: Not Attempted  Ambulation Distance: 30   Ambulation Level of Assist: Min Assist  Bed/Chair/Wheelchair Level of Assist: Min Assist         TEAM CONFERENCE:64min spent re: patient coordination of care with medical and rehab teams.   Present: Physiatrist, Case Managers/Social Service, Nursing, OT, PT, SLP, Neuropsychology. Extensive discussion re: medical and rehabilitative progress, plan of care, and discharge planning.   Refer to signed Team Conference notes as well as therapy notes in EHR for details.    TEAM CONFERENCEnote -if pending at the time of this progress note - please refer to EMR.    35 minutes were spent at the bedside involved in medical evaluation/interview/counselling and on the unit with patient care including Team conference 10 minutes with SLP/PT/OT/NSG/S nursing-bladder 5, bowel 6  PT-min assist with stand pivot, ambulate 30 feet with touchdown weightbearing and rolling walker  OT-supervision with upper body now that he is willing to use adaptive equipment, min assist with lower body  REVIEW OF SYSTEM     Constitutional:  Denies fever/chills/unexplained weight  changes  HEENT:  Denies headache, vision changes  CV:        Denies chest pain/palpitations  Pulm:    Denies shortness of breath  GI:      Denies nausea/vomitting/abd pain; Regular bowel patterns  GU:    Denies bloody/burning urination/trouble controlling bladder  Neuro:   Denies weakness;  Denies paresthesia  Musculoskeletal:   Denies new falls/injuries  Psych: mood euthymic        MEDICATIONS   SCHEDULED MEDICATIONS  Current Facility-Administered Medications   Medication Dose Route Frequency   . acetaminophen  650 mg Oral 4 times per day   . amLODIPine  10 mg Oral Daily   . apixaban  5 mg Oral Q12H   . carvedilol  12.5 mg Oral BID Meals   . Digestive Enzymes  3 capsule Oral BID Meals   . furosemide  40 mg Oral Daily   . glipiZIDE  10 mg Oral BID AC   . insulin lispro  2-16 Units Subcutaneous TID AC    And   . insulin lispro  1-9 Units Subcutaneous QHS   . lactobacillus species  50 Billion CFU Oral Daily   . losartan  50 mg Oral Daily   . metFORMIN  1,000 mg Oral BID Meals   . potassium chloride  10 mEq Oral Daily   . SITagliptin  50 mg Oral BID   . tiZANidine  4 mg Oral TID       AS NEEDED MEDICATIONS  acetaminophen, bisacodyl, diphenhydrAMINE, diphenhydrAMINE, gabapentin, NSG Communication: Glucose POCT order (AC, HS) **  AND** insulin lispro **AND** insulin lispro **AND** insulin lispro, naloxone, ondansetron **OR** ondansetron, ondansetron, oxyCODONE, oxyCODONE, polyethylene glycol, promethazine **OR** promethazine **OR** promethazine, traMADol    PHYSICAL EXAMINATION   VITAL SIGNS  Vitals:    06/19/18 1715   BP: 128/69   Pulse: 86   Resp:    Temp:    SpO2:        EXAMINATION 7/16  GENERAL:                  Alert and oriented X 3. NAD   LUNGS:                       Clear to auscultation, no wheezing, no rhonchi  HEART:                       Irregular  Rhythm without murmur, gallops, or rubs  ABDOMEN:      Soft, nontender, bowel sounds present. No organomegaly. No rebound tenderness  EXTREMITIES:           No  pain, cyanosis, or edema. Neg Homan's on left.  Significant RLE edema, hip to toes,  improved.  Right anterior groin less erythema and appears pinkish around the incision.  Lower lateral incision c/d/i.   NEUROLOGIC:           Alert, oriented, non-focal findings  SKIN:                           Intact  MOTOR:                      Functional strength throughout left.  RLE limited by pain.  Dorsiflexion/plantarflexion 5/5.     SENS:                         Intact bilat to PP/LT, proprioception    OBJECTIVE DATA   Laboratory  Results     Procedure Component Value Units Date/Time    Dextrose Stick Glucose [[id number]] Collected:  06/19/18 1152    Specimen:  Blood Updated:  06/19/18 1208     Glucose, POCT 87 mg/dL     Dextrose Stick Glucose [[id number]]  (Abnormal) Collected:  06/19/18 0732    Specimen:  Blood Updated:  06/19/18 0749     Glucose, POCT 106 (H) mg/dL     Dextrose Stick Glucose [[id number]] Collected:  06/19/18 0253    Specimen:  Blood Updated:  06/19/18 0310     Glucose, POCT 78 mg/dL     Dextrose Stick Glucose [[id number]]  (Abnormal) Collected:  06/18/18 2036    Specimen:  Blood Updated:  06/18/18 2053     Glucose, POCT 117 (H) mg/dL     Dextrose Stick Glucose [[id number]] Collected:  06/18/18 1707    Specimen:  Blood Updated:  06/18/18 2023     Glucose, POCT 87 mg/dL           Micro  Microbiology Results     None            Radiology  XR Chest AP Portable   Final Result   Negative for pneumonia. No acute findings.      ReadingStation:[email or web address]      XR Chest AP Portable   Final Result      No acute airspace disease. Stable minimal vascular prominence but no congestive failure.  No acute airspace disease. No pneumothorax or pleural effusions.      Stable cardiomegaly. Previous CABG and sternotomy. DJD bilateral shoulders.      ReadingStation:[email or web address]            ASSESSMENT   SUMMARY STATEMENT:  The patient states that he came to [facility] for an elective right total hip replacement.  He was injured years ago  while running and suffered a muscular injury which led to arthritis, leg length discrepancy and eventual end-stage hip disease.  He was recovering well from the hip replacement when he had a fall landing on the right knee and suffered a periprosthetic hip fracture.  Patient underwent surgical repair and is currently nonweightbearing.  He does state that knee pain and generalized pain are somewhat limiting.  Last bowel movement was yesterday.  Post op required 4 Units PRBC and developed hyponatremia.      Diagnosis:     Active Hospital Problems    Diagnosis   . Physical debility   1.  S/p right periprosthetic hip fracture after THR.  NWB.              -f/u Dr. Emeline Darling 7/12 due to pain, drainage, reported healing well                gait / Impaired mobility, ADLs / Fall risks / Debility - OT /PT, Fall precautions    Continue with current rehabilitative and medical management plan as discussed above.   Patient is participating in therapy, working towards goals. Refer to therapy notes in EHR for details of current progress.    Coordination of care:   Rehabilitation goal setting and medical management plan discussed with therapy, nursing, and hospitalist team in AM meetings  Medication list and pertinent lab work reviewed.   Medical management as per hospitalist  Patient to continue acute rehabilitation.     [place] Goal: 7/19 w/ HH PT/OT/RN    2.  DVT risks - eliquis  3.  Pain - Initially started on long acting and scheduled Zanaflex qhs. Pt declined. Switched back to Roxi 5-10mg  q3 prn, Zanaflex prn.  IM d/c'd neurontin.  Has increased use of Tramadol.   Used 60 mg 7/8, 50 mg 7/9 and 40 mg 7/10.  Decrease tylenol. 50 mg 7/11. 7/16-scheduled tizanidine 4 mg 3 times daily.  Discussed with patient that hopefully this should reduce use of the Roxicodone.  Patient agreeable with plan.  He is still consistently using the Roxicodone every 3-4 hours on average upon review of past few days. Change Roxi to q4 prn. Monitor. Monitor  Tramadol use, can wean as well.    Took oxy 10 mg qid yesterday and tramadol BID  MME is 70    4.   PVRs, bowel program. + Loose bowels, miralax changed to prn 7/16.  5.  DM-per IM  6.  Afib/flutter - on eliquis  7.  HTN/CAD-coreg, norvasc  8.  SIADH - improving,  fluid restrictions and lasix per IM  9.  Post op anemia - monitor  10.  Wound care, dressing changes q shift. On Keflex 7/5-7/12. . Instructed to start with abx and current care by Dr. Ellyn Hack office on 7/8. Seen by Dr. Emeline Darling 7/12 now with TTWB. Keep incision area dry clean, allow proximal incision to air out during the day.  Staple removal  06/20/2018.  11.  Patient did have a fever 101.1 06/16/18. He appears non toxic.   UA, cxr, labs per IM. Monitor incision. Afebrile  today    PLAN   Patient is participating in therapy, working towards goals.   Refer to therapy notes in EHR for details of current progress.  Patient to continue acute rehabilitation  Continue with current medical management plan as per Internal Medicine hospitalist attending team.     Eddie Candle, MD  Physical Medicine and Rehabilitation

## 2018-06-20 LAB — CBC AND DIFFERENTIAL
Basophils %: 0.2 % (ref 0.0–3.0)
Basophils Absolute: 0 10*3/uL (ref 0.0–0.3)
Eosinophils %: 0.4 % (ref 0.0–7.0)
Eosinophils Absolute: 0 10*3/uL (ref 0.0–0.8)
Hematocrit: 25.6 % — ABNORMAL LOW (ref 39.0–52.5)
Hemoglobin: 8.3 gm/dL — ABNORMAL LOW (ref 13.0–17.5)
Lymphocytes Absolute: 1.2 10*3/uL (ref 0.6–5.1)
Lymphocytes: 16 % (ref 15.0–46.0)
MCH: 32 pg (ref 28–35)
MCHC: 32 gm/dL (ref 32–36)
MCV: 100 fL (ref 80–100)
MPV: 7.1 fL (ref 6.0–10.0)
Monocytes Absolute: 0.6 10*3/uL (ref 0.1–1.7)
Monocytes: 8.3 % (ref 3.0–15.0)
Neutrophils %: 75 % (ref 42.0–78.0)
Neutrophils Absolute: 5.5 10*3/uL (ref 1.7–8.6)
PLT CT: 418 10*3/uL (ref 130–440)
RBC: 2.57 10*6/uL — ABNORMAL LOW (ref 4.00–5.70)
RDW: 17.8 % — ABNORMAL HIGH (ref 11.0–14.0)
WBC: 7.3 10*3/uL (ref 4.0–11.0)

## 2018-06-20 LAB — BASIC METABOLIC PANEL
Anion Gap: 10.8 mMol/L (ref 7.0–18.0)
BUN / Creatinine Ratio: 22.7 Ratio (ref 10.0–30.0)
BUN: 15 mg/dL (ref 7–22)
CO2: 25 mMol/L (ref 20–30)
Calcium: 8.5 mg/dL (ref 8.5–10.5)
Chloride: 101 mMol/L (ref 98–110)
Creatinine: 0.66 mg/dL — ABNORMAL LOW (ref 0.80–1.30)
EGFR: 104 mL/min/{1.73_m2} (ref 60–150)
Glucose: 91 mg/dL (ref 71–99)
Osmolality Calc: 267 mOsm/kg — ABNORMAL LOW (ref 275–300)
Potassium: 3.8 mMol/L (ref 3.5–5.3)
Sodium: 133 mMol/L — ABNORMAL LOW (ref 136–147)

## 2018-06-20 LAB — VH DEXTROSE STICK GLUCOSE
Glucose POCT: 108 mg/dL — ABNORMAL HIGH (ref 71–99)
Glucose POCT: 125 mg/dL — ABNORMAL HIGH (ref 71–99)
Glucose POCT: 94 mg/dL (ref 71–99)
Glucose POCT: 92 mg/dL (ref 71–99)

## 2018-06-20 MED ORDER — LOSARTAN POTASSIUM 50 MG PO TABS
50.00 mg | ORAL_TABLET | Freq: Every day | ORAL | 0 refills | Status: AC
Start: 2018-06-20 — End: ?

## 2018-06-20 MED ORDER — TRAMADOL HCL 50 MG PO TABS
50.00 mg | ORAL_TABLET | Freq: Four times a day (QID) | ORAL | 0 refills | Status: AC | PRN
Start: 2018-06-20 — End: 2018-06-27

## 2018-06-20 MED ORDER — CARVEDILOL 12.5 MG PO TABS
12.50 mg | ORAL_TABLET | Freq: Two times a day (BID) | ORAL | 0 refills | Status: AC
Start: 2018-06-20 — End: ?

## 2018-06-20 MED ORDER — FUROSEMIDE 20 MG PO TABS
40.00 mg | ORAL_TABLET | ORAL | 0 refills | Status: AC | PRN
Start: 2018-06-20 — End: ?

## 2018-06-20 MED ORDER — APIXABAN 5 MG PO TABS
5.00 mg | ORAL_TABLET | Freq: Two times a day (BID) | ORAL | 0 refills | Status: AC
Start: 2018-06-20 — End: ?

## 2018-06-20 MED ORDER — POTASSIUM CHLORIDE ER 10 MEQ PO CPCR
10.00 meq | ORAL_CAPSULE | Freq: Every day | ORAL | 0 refills | Status: AC | PRN
Start: 2018-06-20 — End: ?

## 2018-06-20 MED ORDER — GABAPENTIN 100 MG PO CAPS
100.00 mg | ORAL_CAPSULE | Freq: Three times a day (TID) | ORAL | 0 refills | Status: AC | PRN
Start: 2018-06-20 — End: ?

## 2018-06-20 MED ORDER — OXYCODONE HCL 5 MG PO TABS
5.00 mg | ORAL_TABLET | ORAL | 0 refills | Status: AC | PRN
Start: 2018-06-20 — End: 2018-06-27

## 2018-06-20 MED ORDER — EMPAGLIFLOZIN 10 MG PO TABS
10.00 mg | ORAL_TABLET | Freq: Every day | ORAL | 0 refills | Status: AC
Start: 2018-06-20 — End: ?

## 2018-06-20 MED ORDER — TIZANIDINE HCL 4 MG PO CAPS
4.00 mg | ORAL_CAPSULE | Freq: Three times a day (TID) | ORAL | 0 refills | Status: AC | PRN
Start: 2018-06-20 — End: ?

## 2018-06-20 MED ORDER — AMLODIPINE BESYLATE 10 MG PO TABS
10.00 mg | ORAL_TABLET | Freq: Every day | ORAL | 0 refills | Status: AC
Start: 2018-06-21 — End: ?

## 2018-06-20 MED ORDER — SITAGLIPTIN-METFORMIN HCL 50-500 MG PO TABS
1.00 | ORAL_TABLET | Freq: Two times a day (BID) | ORAL | 0 refills | Status: AC
Start: 2018-06-20 — End: ?

## 2018-06-20 NOTE — Discharge Summary (Signed)
DISCHARGE SUMMARY  [facility]     Patient: [name]  Admission Date: 06/03/2018   DOB: [birth year]  Discharge Date: 06/20/2018    MRN: [id number]  Discharge Attending: Berna Bue MD   Referring Physician: Md, Out Of Network (Inactive)  PCP: Md, Out Of Network (Inactive)     Discharge Information   Discharge Diagnoses:   Active Problems:    Right periprosthetic mid femur fracture adjacent to distal tip of femoral prosthesis s/p ORIF with cables and placement of long stem implant on 05/29/2018 Dr Emeline Darling    Recent elective right hip replacement on 05/19/2018 Dr Emeline Darling    Anemia due to acute blood loss    CAD s/p PCI in 2008, s/p CABG 2011    Atrial flutter on eliquis    Diabetes mellitus type II     Hypertension     Hyperlipidemia      Discharge Medications:     Medication List      START taking these medications    gabapentin 100 MG capsule  Commonly known as:  NEURONTIN  Take 1 capsule (100 mg total) by mouth 3 (three) times daily as needed (neuropathic pain)     oxyCODONE 5 MG immediate release tablet  Commonly known as:  ROXICODONE  Take 1-2 tablets (5-10 mg total) by mouth every 4 (four) hours as needed for Pain     traMADol 50 MG tablet  Commonly known as:  ULTRAM  Take 1 tablet (50 mg total) by mouth every 6 (six) hours as needed for Pain        CHANGE how you take these medications    amLODIPine 10 MG tablet  Commonly known as:  NORVASC  Take 1 tablet (10 mg total) by mouth daily  Start taking on:  06/21/2018  What changed:   medication strength   how much to take     carvedilol 12.5 MG tablet  Commonly known as:  COREG  Take 1 tablet (12.5 mg total) by mouth 2 (two) times daily with meals  What changed:   medication strength   See the new instructions.     furosemide 20 MG tablet  Commonly known as:  LASIX  Take 2 tablets (40 mg total) by mouth as needed (edema)  What changed:  reasons to take this     losartan 50 MG tablet  Commonly known as:  COZAAR  Take 1 tablet (50 mg total) by mouth  daily  What changed:  Another medication with the same name was removed. Continue taking this medication, and follow the directions you see here.     potassium chloride 10 MEQ CR capsule  Commonly known as:  MICRO-K  Take 1 capsule (10 mEq total) by mouth daily as needed (when taking lasix)  What changed:   when to take this   reasons to take this     TiZANidine 4 MG capsule  Commonly known as:  ZANAFLEX  Take 1 capsule (4 mg total) by mouth 3 (three) times daily as needed for Muscle spasms  What changed:   when to take this   reasons to take this        CONTINUE taking these medications    acetaminophen 325 MG tablet  Commonly known as:  TYLENOL  Take 2 tablets (650 mg total) by mouth every 4 (four) hours     apixaban 5 MG  Commonly known as:  ELIQUIS  Take 1 tablet (5 mg total) by mouth every  12 (twelve) hours     empagliflozin 10 MG tablet  Commonly known as:  JARDIANCE  Take 1 tablet (10 mg total) by mouth daily     SITagliptin-metFORMIN 50-500 MG tablet  Commonly known as:  JANUMET  Take 1 tablet by mouth 2 (two) times daily with meals        STOP taking these medications    glipiZIDE 10 MG tablet  Commonly known as:  GLUCOTROL           Where to Get Your Medications      These medications were sent to [id number], [place] - [address]  [address] [place] [address]    Phone:  [phone]    amLODIPine 10 MG tablet   apixaban 5 MG   carvedilol 12.5 MG tablet   empagliflozin 10 MG tablet   furosemide 20 MG tablet   losartan 50 MG tablet   potassium chloride 10 MEQ CR capsule   SITagliptin-metFORMIN 50-500 MG tablet   TiZANidine 4 MG capsule     You can get these medications from any pharmacy    Bring a paper prescription for each of these medications   gabapentin 100 MG capsule   oxyCODONE 5 MG immediate release tablet   traMADol 50 MG tablet             Hospital Course   History of Present Illness and Hospital Course (17 Days)     [name] is a 62  y.o. male with PMH     Patient was hospitalized at [facility] 6/24-06/03/2018 for right periprosthetic hip fracture after a fall.  He had underwent successful elective right total hip replacement on 05/19/2018 by Dr Emeline Darling and had then went home. He was doing well until the morning of admission his right knee gave out and he fell, landing on his right side.  He was found with mid femur fracture adjacent to the distal tip of the femoral prosthesis. He underwent ORIF with cables and placement of a long stem implant on 05/29/2018 Dr Emeline Darling. He received 2 units prbc 6/28. He developed hyponatremia and was placed on fluid restriction and salt tabs. His right knee was injected with kenalog on 7/1 for continued pain.  See d/c summary from that admission for details.     Patient was then hospitalized at acute rehab at [facility] 7/2-7/19/2019.  Patient developed fever and erythema of his wound. He was treated with keflex and has finished a course. He was seen by his surgeon on 06/13/2018 who removed the staples on the lower half of the wound. He was to have the rest of the staples removed today but he still has significant edema with tension on the wound so every other staple has been removed and he will see his surgeon on 06/23/2018.  He has been treated with lasix for his edema which is improving. He should remain on low sodium diet and fluid restriction. His blood pressures were noted to be elevated and his amlodipine was increased from 5 to 10 and his carvedilol was increased from 6.25 bid to 12.5 bid.  He will resume his home oral diabetic regimen.  He will continue with chronic eliquis anticoagulation for history of atrial flutter/fib.  He will require home health PT/OT and skilled nursing.     Past Medical History:   Diagnosis Date   . Atrial flutter    . CAD (coronary artery disease)    . Carpal tunnel syndrome  H/O   . DM2 (diabetes mellitus, type 2)    . ED (erectile dysfunction)    . Encounter for blood transfusion    . Hypertension     . MI (myocardial infarction) '08, '11   . OA (osteoarthritis)    . Psoriasis        Past Surgical History:   Procedure Laterality Date   . ARTHROPLASTY, HIP, TOTAL, ANTERIOR APPROACH, C ARM Right 05/19/2018    Procedure: ARTHROPLASTY, HIP, TOTAL, ANTERIOR APPROACH, C ARM;  Surgeon: Len Childs, MD;  Location: [facility];  Service: Orthopedics;  Laterality: Right;  RIGHT TOTAL HIP REPLACEMENT, ASI    . ARTHROPLASTY, HIP, TOTAL, REVISION, ANTERIOR APPROACH, C ARM Right 05/29/2018    Procedure: ARTHROPLASTY, HIP, TOTAL, REVISION, ANTERIOR APPROACH, C ARM;  Surgeon: Len Childs, MD;  Location: [facility];  Service: Orthopedics;  Laterality: Right;  RT REVISION PROSTETIC TOTAL HIP    . BICEPS TENDON REPAIR Right    . CARDIOVERSION     . CORONARY ARTERY BYPASS GRAFT  2011   . CYST REMOVAL      RIGHT CHEEK   . INGUINAL HERNIA REPAIR Right     AS CHILD   . KNEE ARTHROTOMY Left 1975   . ORIF, RADIUS, DISTAL (WRIST) Right    . SINUS SURGERY           Procedures/Imaging:   XR Chest AP Portable   Final Result   Negative for pneumonia. No acute findings.      ReadingStation:[email or web address]      XR Chest AP Portable   Final Result      No acute airspace disease. Stable minimal vascular prominence but no congestive failure.      No acute airspace disease. No pneumothorax or pleural effusions.      Stable cardiomegaly. Previous CABG and sternotomy. DJD bilateral shoulders.      ReadingStation:[email or web address]          Treatment Team:   Attending Provider: Deirdre Peer, MD  Consulting Physician: Michail Jewels, MD         Progress Note/Physical Exam at Discharge     Subjective:   Feeling better each day. Still having a lot of pain with therapy but has been slowly improving      Therapy Current Functional Levels:   Overall ADL Level of Assist: (P) Supervision  Toilet Transfer Level of Assist: (P) Min Assist  Tub/Shower Level of Assist: (P) Not Attempted  Ambulation Distance: (P) 30 feet  Ambulation  Level of Assist: (P) Supervision  Bed/Chair/Wheelchair Level of Assist: (P) Supervision       Vitals:    06/19/18 1715 06/19/18 1952 06/20/18 0411 06/20/18 0823   BP: 128/69 126/70 137/72 140/74   Pulse: 86 86 88 89   Resp:  20 18    Temp:  99.5 F (37.5 C) 98.2 F (36.8 C)    TempSrc:  Oral Oral    SpO2:  98% 95%    Weight:   136.1 kg (300 lb 0.7 oz)    Height:           General: NAD, AAOx3    HEENT: perrla, eomi, sclera anicteric, OP: Clear, MMM    Neck: supple, FROM, no LAD    Cardiovascular: RRR, no m/r/g    Lungs: CTAB, no w/r/r    Abdomen: soft, +BS, NT/ND, no masses, no g/r    Extremities:   1+ edema   Skin:  Right groin  wound with slowly improving erythema        Diagnostics     Labs/Studies Pending at Discharge: No    Last Labs     Recent Labs  Lab 06/20/18  0430 06/18/18  0455 06/16/18  0545   WBC 7.3 7.0 7.5   RBC 2.57* 2.72* 2.66*   Hemoglobin 8.3* 8.7* 8.7*   Hematocrit 25.6* 27.4* 26.6*   MCV 100 101* 100   PLT CT 418 420 421         Recent Labs  Lab 06/20/18  0430 06/18/18  0455 06/16/18  0545 06/14/18  0630   Sodium 133* 134* 132* 132*   Potassium 3.8 4.0 4.2 4.3   Chloride 101 101 99 100   CO2 25 24.0 25 26   BUN 15 12 12 14    Creatinine 0.66* 0.65* 0.66* 0.71*   Glucose 91 78 91 116*   Calcium 8.5 8.7 8.9 8.7        Patient Instructions   Discharge Diet: cardiac diet and diabetic diet  Discharge Activity:  activity as tolerated, NO driving     Follow Up Appointment:  Follow-up Information     Len Childs, MD Follow up on 06/24/2018.    Specialty:  Orthopaedic Surgery  Why:  9:00  Contact information:  [address]  [place] [address]  [phone]             [facility], Out Of Network .                  Time spent examining patient, discussing with patient/family regarding hospital course, chart review, reconciling medications and discharge planning: 45 minutes.    Signed,  Berna Bue, MD  12:33 PM 06/20/2018     Cc: PCP and all consultants on listed above

## 2018-06-20 NOTE — Progress Notes (Signed)
PHYSICAL MEDICINE AND REHABILITATION PROGRESS NOTE    Date: 06/20/18 Time: 1:41 PM  Patient Name: [name]    CC:  On consult for functional and rehabilitative needs; general pain management; and assessment of medical complexities and co-morbidities as barriers to rehabilitation.      SUBJECTIVE     Reviewed access to the patient's temporary housing.  Initially patient stated there would be no steps to enter however there are in actuality 2.  We discussed renting a temporary ramp and social work was contacted to provide the patient with phone numbers to obtain.  Primary caregiver also assured social worker previously that they would be assistance to help the patient up to 2 stairs if needed.  Continue to encourage the patient to his pain medications.  Also arranged follow-up with orthopedics next week for wound check.    Therapy Current Functional Levels:   Overall ADL Level of Assist: Supervision  Toilet Transfer Level of Assist: Min Assist  Tub/Shower Level of Assist: Not Attempted  Ambulation Distance: 30   Ambulation Level of Assist: Supervision  Bed/Chair/Wheelchair Level of Assist: Supervision     REVIEW OF SYSTEM     Constitutional:  Denies fever/chills/unexplained weight changes  HEENT:  Denies headache, vision changes  CV:        Denies chest pain/palpitations  Pulm:    Denies shortness of breath  GI:      Denies nausea/vomitting/abd pain; Regular bowel patterns  GU:    Denies bloody/burning urination/trouble controlling bladder  Neuro:   Denies weakness;  Denies paresthesia  Musculoskeletal:   Denies new falls/injuries  Psych: mood euthymic        MEDICATIONS   SCHEDULED MEDICATIONS  Current Facility-Administered Medications   Medication Dose Route Frequency   . acetaminophen  650 mg Oral 4 times per day   . amLODIPine  10 mg Oral Daily   . apixaban  5 mg Oral Q12H   . carvedilol  12.5 mg Oral BID Meals   . Digestive Enzymes  3 capsule Oral BID Meals   . furosemide  40 mg Oral Daily   . glipiZIDE   10 mg Oral BID AC   . insulin lispro  2-16 Units Subcutaneous TID AC    And   . insulin lispro  1-9 Units Subcutaneous QHS   . lactobacillus species  50 Billion CFU Oral Daily   . losartan  50 mg Oral Daily   . metFORMIN  1,000 mg Oral BID Meals   . potassium chloride  10 mEq Oral Daily   . SITagliptin  50 mg Oral BID   . tiZANidine  4 mg Oral TID       AS NEEDED MEDICATIONS  acetaminophen, bisacodyl, diphenhydrAMINE, diphenhydrAMINE, gabapentin, NSG Communication: Glucose POCT order (AC, HS) **AND** insulin lispro **AND** insulin lispro **AND** insulin lispro, naloxone, ondansetron **OR** ondansetron, ondansetron, oxyCODONE, oxyCODONE, polyethylene glycol, promethazine **OR** promethazine **OR** promethazine, traMADol    PHYSICAL EXAMINATION   VITAL SIGNS  Vitals:    06/20/18 1208   BP: 127/67   Pulse: 84   Resp: 18   Temp:    SpO2: 97%       EXAMINATION   GENERAL:                  Alert and oriented X 3. NAD   LUNGS:                       Clear to auscultation, no wheezing,  no rhonchi  HEART:                       Irregular  Rhythm without murmur, gallops, or rubs  ABDOMEN:      Soft, nontender, bowel sounds present. No organomegaly. No rebound tenderness  EXTREMITIES:           No pain, cyanosis, or edema. Neg Homan's on left.  Significant RLE edema, hip to toes,  improved.  Right anterior groin with increased area of erythema around the staples.  There is a small opening and slight fibrinous exudate..  Lower lateral incision c/d/i.   NEUROLOGIC:           Alert, oriented, non-focal findings  SKIN:                           Intact  MOTOR:                      Functional strength throughout left.  RLE limited by pain.  Dorsiflexion/plantarflexion 5/5.     SENS:                         Intact bilat to PP/LT, proprioception    OBJECTIVE DATA   Laboratory  Results     Procedure Component Value Units Date/Time    Dextrose Stick Glucose [[id number]] Collected:  06/20/18 1208    Specimen:  Blood Updated:  06/20/18 1226      Glucose, POCT 94 mg/dL     Dextrose Stick Glucose [[id number]]  (Abnormal) Collected:  06/20/18 0743    Specimen:  Blood Updated:  06/20/18 0800     Glucose, POCT 125 (H) mg/dL     Basic Metabolic Panel [[id number]]  (Abnormal) Collected:  06/20/18 0430    Specimen:  Plasma Updated:  06/20/18 0750     Sodium 133 (L) mMol/L      Potassium 3.8 mMol/L      Chloride 101 mMol/L      CO2 25 mMol/L      Calcium 8.5 mg/dL      Glucose 91 mg/dL      Creatinine 0.86 (L) mg/dL      BUN 15 mg/dL      Anion Gap 57.8 mMol/L      BUN/Creatinine Ratio 22.7 Ratio      EGFR 104 mL/min/1.31m2      Osmolality Calc 267 (L) mOsm/kg     CBC and differential [[id number]]  (Abnormal) Collected:  06/20/18 0430    Specimen:  Blood from Blood Updated:  06/20/18 0740     WBC 7.3 K/cmm      RBC 2.57 (L) M/cmm      Hemoglobin 8.3 (L) gm/dL      Hematocrit 41.3 (L) %      MCV 100 fL      MCH 32 pg      MCHC 32 gm/dL      RDW 24.4 (H) %      PLT CT 418 K/cmm      MPV 7.1 fL      NEUTROPHIL % 75.0 %      Lymphocytes 16.0 %      Monocytes 8.3 %      Eosinophils % 0.4 %      Basophils % 0.2 %      Neutrophils Absolute 5.5 K/cmm      Lymphocytes Absolute 1.2  K/cmm      Monocytes Absolute 0.6 K/cmm      Eosinophils Absolute 0.0 K/cmm      BASO Absolute 0.0 K/cmm     Dextrose Stick Glucose [[id number]]  (Abnormal) Collected:  06/20/18 0358    Specimen:  Blood Updated:  06/20/18 0425     Glucose, POCT 108 (H) mg/dL     Dextrose Stick Glucose [[id number]]  (Abnormal) Collected:  06/19/18 2003    Specimen:  Blood Updated:  06/19/18 2030     Glucose, POCT 110 (H) mg/dL     Dextrose Stick Glucose [[id number]]  (Abnormal) Collected:  06/19/18 1712    Specimen:  Blood Updated:  06/19/18 1728     Glucose, POCT 115 (H) mg/dL           Micro  Microbiology Results     None            Radiology  XR Chest AP Portable   Final Result   Negative for pneumonia. No acute findings.      ReadingStation:[email or web address]      XR Chest AP Portable   Final Result      No acute airspace  disease. Stable minimal vascular prominence but no congestive failure.      No acute airspace disease. No pneumothorax or pleural effusions.      Stable cardiomegaly. Previous CABG and sternotomy. DJD bilateral shoulders.      ReadingStation:[email or web address]            ASSESSMENT   SUMMARY STATEMENT:  The patient states that he came to [facility] for an elective right total hip replacement.  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  He was recovering well from the hip replacement when he had a fall landing on the right knee and suffered a periprosthetic hip fracture.  Patient underwent surgical repair and is currently nonweightbearing.  He does state that knee pain and generalized pain are somewhat limiting.  Last bowel movement was yesterday.  Post op required 4 Units PRBC and developed hyponatremia.      Diagnosis:     Active Hospital Problems    Diagnosis   . Physical debility   1.  S/p right periprosthetic hip fracture after THR.  NWB.              -f/u Dr. Emeline Darling 7/12 due to pain, drainage, reported healing well   -Due to the appearance of the wound we will take out every other staple will arrange for another wound check on July 23                gait / Impaired mobility, ADLs / Fall risks / Debility - OT /PT, Fall precautions    Continue with current rehabilitative and medical management plan as discussed above.   Patient is participating in therapy, working towards goals. Refer to therapy notes in EHR for details of current progress.    Coordination of care:   Rehabilitation goal setting and medical management plan discussed with therapy, nursing, and hospitalist team in AM meetings  Medication list and pertinent lab work reviewed.   Medical management as per hospitalist  Patient to continue acute rehabilitation.     [place] Goal: 7/19 w/ HH PT/OT/RN    2.  DVT risks - eliquis  3.  Pain - Initially started on  long acting and scheduled Zanaflex qhs. Pt declined. Switched back to  Roxi 5-10mg  q3 prn, Zanaflex prn.  IM d/c'd neurontin.  Has increased use of Tramadol.   Used 60 mg 7/8, 50 mg 7/9 and 40 mg 7/10.  Decrease tylenol. 50 mg 7/11. 7/16-scheduled tizanidine 4 mg 3 times daily.  Discussed with patient that hopefully this should reduce use of the Roxicodone.  Patient agreeable with plan.  He is still consistently using the Roxicodone every 3-4 hours on average upon review of past few days. Change Roxi to q4 prn. Monitor. Monitor Tramadol use, can wean as well.    Took oxy 10 mg 5x  yesterday and tramadol TID     4.   PVRs, bowel program. + Loose bowels, miralax changed to prn 7/16.  5.  DM-per IM  6.  Afib/flutter - on eliquis  7.  HTN/CAD-coreg, norvasc  8.  SIADH - improving,  fluid restrictions and lasix per IM  9.  Post op anemia - monitor  10.  Wound care, dressing changes q shift. On Keflex 7/5-7/12. . Instructed to start with abx and current care by Dr. Ellyn Hack office on 7/8. Seen by Dr. Emeline Darling 7/12 now with TTWB. Keep incision area dry clean, allow proximal incision to air out during the day.  Staple removal  06/20/2018.  Will take out every other and has f/u 7/23  11.  Patient did have a fever 101.1 06/16/18. He appears non toxic.   UA, cxr, labs per IM. Monitor incision. Afebrile today    PLAN   Patient is participating in therapy, working towards goals.   Refer to therapy notes in EHR for details of current progress.  Patient to continue acute rehabilitation  Continue with current medical management plan as per Internal Medicine hospitalist attending team.     Eddie Candle, MD  Physical Medicine and Rehabilitation

## 2018-06-20 NOTE — Progress Notes (Signed)
[facility]  Physical Therapy  Discharge Note      Patient Name: [name], [name]         MRN:   [id number]    Department:  REHABILITATION UNIT        Room:  3307/3307-A  Date:   06/20/2018    Medical Diagnosis     Encounter for other specified aftercare [Z51.89]  S/P ORIF (open reduction internal fixation) fracture [Z96.7, Z87.81]    Patient Active Problem List   Diagnosis   . Osteoarthritis of right hip   . Femur fracture, right   . CAD (coronary artery disease)   . AF (atrial fibrillation)   . DM (diabetes mellitus)   . HTN (hypertension)   . Physical debility       Past Medical/Surgical History     Past Medical History:   Diagnosis Date   . Atrial flutter    . CAD (coronary artery disease)    . Carpal tunnel syndrome     H/O   . DM2 (diabetes mellitus, type 2)    . ED (erectile dysfunction)    . Encounter for blood transfusion    . Hypertension    . MI (myocardial infarction) '08, '11   . OA (osteoarthritis)    . Psoriasis       Past Surgical History:   Procedure Laterality Date   . ARTHROPLASTY, HIP, TOTAL, ANTERIOR APPROACH, C ARM Right 05/19/2018    Procedure: ARTHROPLASTY, HIP, TOTAL, ANTERIOR APPROACH, C ARM;  Surgeon: Len Childs, MD;  Location: [facility];  Service: Orthopedics;  Laterality: Right;  RIGHT TOTAL HIP REPLACEMENT, ASI    . ARTHROPLASTY, HIP, TOTAL, REVISION, ANTERIOR APPROACH, C ARM Right 05/29/2018    Procedure: ARTHROPLASTY, HIP, TOTAL, REVISION, ANTERIOR APPROACH, C ARM;  Surgeon: Len Childs, MD;  Location: [facility];  Service: Orthopedics;  Laterality: Right;  RT REVISION PROSTETIC TOTAL HIP    . BICEPS TENDON REPAIR Right    . CARDIOVERSION     . CORONARY ARTERY BYPASS GRAFT  2011   . CYST REMOVAL      RIGHT CHEEK   . INGUINAL HERNIA REPAIR Right     AS CHILD   . KNEE ARTHROTOMY Left 1975   . ORIF, RADIUS, DISTAL (WRIST) Right    . SINUS SURGERY         Flow Sheet Data      06/20/18 0826   Documentation type   Documentation  Discharge   Rehab Therapy Precautions   Weight Bearing Status RLE TTWB   Total Hip Replacement no external rotation  (avoid combination of hip hyperextension and ER)   Other Precautions Falls   Prior Level of Function   Prior Level of Function Independent with ADLs;Ambulates independently  (Uses SPC for long distances)   Prior Assistive Device Used Yes   Assistive Device Single point cane;Front wheel walker   Baseline Activity Level Community ambulation   Employment FT   DME Currently at [employer] aid;Reacher;Single point cane;Front wheel walker  (shower chair, raised toilet seat)   Home Living Arrangements   Living Arrangements Alone  (sister and her boyfriend available in the evenings)   Type of Home Apartment  (1st floor)   Home Layout One level;Performs ADL's on one level   Bathroom Accessibility Accessible via wheelchair;Accessible via walker   Home Living Comments handicap accessible    Subjective   Subjective Statement "I'm feeling pretty good this moring."   Patient  Goal "Therapy has helped me being able to walk and overall recovery from surgery"   Therapy Vital Signs   Blood Pressure 143/82   Heart Rate 93   O2 99  (RA)   Cognition   Behavior calm;cooperative   Following Commands Follows all commands and directions without difficulty   Neuro Status   Coordination intact   Hand Dominance right handed   Pain Assessment   Pain Assessment No/denies pain   Skin Integrity   Skin Integrity RLE edema and bruising around surgical site.    Sensory    Light touch inconsistent results below the ankles bilaterally   5.07 Monofilament  0/5 bilaterally    Proprioception 5/5 Great toe find bilaterally   Gross ROM   Right Upper Extremity ROM WFL   Left Upper Extremity ROM WFL   R Knee Flexion 0-140 95   Left Lower Extremity ROM WFL   Gross ROM Comments Patient is leaning to the L and lifting R ischial tuberosity during knee flexion   Gross Strength   L Hip Flexion 4+/5   L Knee Flexion 5/5   L Knee Extension 5/5   L Ankle  Dorsiflexion 5/5   Functional Mobility   Roll to Left UTA   Roll to Right UTA   Lying to Sitting Minimal Assist   Sitting to Lying Minimal Assist   Seated Lateral Scooting Supervision   Sit to Stand Supervision   Stand to Sit Supervision   Functional Mobility Comments Patient requires assistance moving RLE in and out of bed.    Transfer Bed/Wheelchair/Chair - Ambulatory   Device/Technique Stand Pivot;FWW   Bed Transfer Score  4-Patient requires steadying assistance of only one person to complete transfer to and from bed, chair, or wheelchair;4a-Patient requires assistance only to lift only one leg in and/or out of the bed   Goal score (Ambulatory) transfers 6-Mod I   Bed Transfer Discharge Score  4a-Patient requires assistance only to lift only one leg in and/or out of the bed   Other Transfers   Floor transfer  (not performed)   Car transfer  (not performed)   Balance   Sitting - Static Good   Sitting - Dynamic Good   Standing - Static Fair   Standing - Dynamic Fair   Balance Comments Patient is able to alternate UE support while standing stand for 1 minute. Tolerance to standing balance continues to be challenged by increased fatigue and pain with activity.   Locomotion Ambulation   Walking Distance 1- equals less than 72ft   Goal Walking Distance  2- equals 50-145ft   Discharge Walking Distance  1- equals less than 26ft   Walking Score 1-Patient ambulates less than 50 feet   Walking Goal Score  6-Mod I   Walking Discharge Score  1-Patient ambulates less than 50 feet   Ambulation Level of Assist/Device Supervision;with front-wheeled walker   Ambulation Distance (Feet) 30 Feet   Gait Speed (M/Sec) (unable to assess at this time due to seated rest breaks)   Gait Pattern 3 point   Ambulation Comments Patient was able to increase ambulation distance while maintaining toe touch WB status.   Training and development officer 3 - greater  than or equal to 150 ft   Goal Wheelchair Distance  3 - greater than or equal to 150 ft   Discharge Wheelchair Distance  3 - greater than or equal to 150  ft   Wheelchair Score 6-Patient propels wheelchair a minimum of 150 feet independently, safely   Wheelchair Goal Score 7-Independent   Wheelchair Discharge Score  6-Patient propels wheelchair a minimum of 150 feet independently, safely   Wheelchair Parts Management Patient locks (R) brakes;Patient locks (L) brakes;Patient lifts (L) foot rest;Helper lifts (R) foot rest;Helper removes (R) arm rest;Helper removes (L) arm rest   Locomotion Other   Curb Not performed   Ramp Not performed   Stair Management    Stairs score 0-Activity did not occur upon evaluation   Patient Education   Patient Education Patient was educated on discharge plans and home exercises program to be completed at home.    Assessment   Strengths independent PLOF;active participant;owns equipment;good UE control;good UE strength;vision intact   Limitations impaired activity tolerance;impaired sensation;impaired LB strength;impaired standing balance;pain limiting activity   Prognosis Good   Barriers to discharge uncertain how often help will be available at home   Progress Progressing toward goals   Assessment [name] has been participating in therapy twice a day. He has currently met 0/5 LTGs, however he has shown improvements in strength, balance, and mobility as evident by the following information. He is still performing bed mobility with Min A, however he is progressing towards becoming supervision with a leg lifter assistive device. He is now able to perform a SPT with supervision and FWW, which has improved from Min A last week. The patient is now able to walk 30 feet with supervision and FWW, which has improved from Min A last week. He is also able to walk with less fatigue compared to previous treatment sessions. He continues to be able to stand with alternating UE support for 1 minute  in order to be able to perform daily ADLs. At this point the patient has demonstrated a plateau with functional activities in therapy and it is recommended that he be discharged home with home health PT.  Patient and caregivers were offered training but refused.  24/7 assistance will be available to him at discharge.      Goals   Long Term Goals To be completed by d/c; Patient will be able to complete all aspects of bed mobility with Mod I. NOT MET 2) Patient will be able to perform SPT with FWW and Mod I. NOT MET 3) Patient will be able to ambulate 50 feet with FWW and Mod I. NOT MET 4) Patient will be able to complete a car transfer with FWW and supervision to allow for safe d/c home.  NOT MET   Plan   Discharge Recommendation Home with home health PT   DME Recommended for Discharge Front wheel walker;Wheelchair-manual   Estimated Length of Stay (Days) 0   Discharge Date 06/20/18     Migdalia Dk, SPT  ______________________________________________________________________    I have reviewed and agree with the documentation authored by my student of the evaluation/ treatment/education/care plan delivered during my direct supervision.     Co-Signed by:  Macario Carls, PT  ______________________________________________________________________

## 2018-06-20 NOTE — Plan of Care (Signed)
Problem: Compromised Tissue integrity  Goal: Damaged tissue is healing and protected  I cleansed incision site with safe-clens. I removed every other staple via pt's right upper groin incision line proximally & applied 2 steri-strips. Left distal staples patent, where white tissue is visible, per M.D. Order.  Pt. Is being d'cd from [facility]  today. He said, he is not sure what time his ride will be here, yet.

## 2018-06-24 NOTE — Progress Notes (Signed)
[facility]  Occupational Therapy  Discharge Note    Patient Name: [name], [name]         MRN:   [id number]    Department:  REHABILITATION UNIT        Room:  3307/3307-A  Date:   06/24/2018    Medical Diagnosis     Encounter for other specified aftercare [Z51.89]  S/P ORIF (open reduction internal fixation) fracture [Z96.7, Z87.81]    Patient Active Problem List   Diagnosis   . Osteoarthritis of right hip   . Femur fracture, right   . CAD (coronary artery disease)   . AF (atrial fibrillation)   . DM (diabetes mellitus)   . HTN (hypertension)   . Physical debility       Past Medical/Surgical History     Past Medical History:   Diagnosis Date   . Atrial flutter    . CAD (coronary artery disease)    . Carpal tunnel syndrome     H/O   . DM2 (diabetes mellitus, type 2)    . ED (erectile dysfunction)    . Encounter for blood transfusion    . Hypertension    . MI (myocardial infarction) '08, '11   . OA (osteoarthritis)    . Psoriasis       Past Surgical History:   Procedure Laterality Date   . ARTHROPLASTY, HIP, TOTAL, ANTERIOR APPROACH, C ARM Right 05/19/2018    Procedure: ARTHROPLASTY, HIP, TOTAL, ANTERIOR APPROACH, C ARM;  Surgeon: Len Childs, MD;  Location: [facility];  Service: Orthopedics;  Laterality: Right;  RIGHT TOTAL HIP REPLACEMENT, ASI    . ARTHROPLASTY, HIP, TOTAL, REVISION, ANTERIOR APPROACH, C ARM Right 05/29/2018    Procedure: ARTHROPLASTY, HIP, TOTAL, REVISION, ANTERIOR APPROACH, C ARM;  Surgeon: Len Childs, MD;  Location: [facility];  Service: Orthopedics;  Laterality: Right;  RT REVISION PROSTETIC TOTAL HIP    . BICEPS TENDON REPAIR Right    . CARDIOVERSION     . CORONARY ARTERY BYPASS GRAFT  2011   . CYST REMOVAL      RIGHT CHEEK   . INGUINAL HERNIA REPAIR Right     AS CHILD   . KNEE ARTHROTOMY Left 1975   . ORIF, RADIUS, DISTAL (WRIST) Right    . SINUS SURGERY         Flow Sheet Data      06/20/18 1300   Documentation type:    Type  Discharge   Rehab Therapy Precautions   Weight Bearing Status RLE TTWB   Total Hip Replacement no external rotation  (hyperextension)   Other Precautions [place]   Subjective   Subjective Statement "I have a sturdy foldable chair I can use right outside the bathroom as a place to rest if I get tired after getting ready in the bathroom"   Patient Goal "Get well as fast as possible"   Prior Level of Function   Prior level of function Independent with ADLs;Ambulates independently   Baseline Activity Level Community ambulation   Work Real Games developer for self   Driving independent   Home Living Arrangements   Living Arrangements Alone   Type of Home Apartment   Home Layout One level   Type of  Shower/Tub Walk-in shower   Type of  Toilet Raised   Bathroom Accessibility Accessible   DME Currently at Home Reacher;Sock Aid;Long-Handled [place]   Home Living - Notes / Comments pt  reports he will stay w/ friend in [place] before going back to [place]. Pt reports he will stay in one-level basement with 2 STE and full BR w/ walk-in shower.   Metallurgist TBE   Home Management    Home Management Comments sister helps with house cleaning and laundry weekly in [place]. Pt reports he has a friend helping him daily prn with bathing and meal prepping/cooking.   Cognition   Behavior calm cooperative;attentive   Cognition Comments alert, oriented x3   Comprehension   Comprehension Type  Both   Devices/Techniques Glasses   Comprehension Prompts Repetition   Goal Score Comprehension 7-Independent   Discharge Score Comprehension 7-Patient understands complex and abstract conversations   Expression    Expression Type Vocal   Goal Score Expression 7-Independent   Discharge score expression 7-Patient expresses complex and abstract ideas clearly independently   Problem solving    Goal Score Problem Solving 7-Independent   Discharge Score Problem Solving 7b-Patient solves complex problems to  complete tasks   Memory   Goal Score Memory 7-Independent   Discharge Score Memory 7-Patient recognizes familiar people/things   Neuro Status   Motor Planning intact   Coordination intact   Hand Dominance right handed   Motor control   RUE WFL   RUE Finger to Nose Test  [facility]   RUE Finger Opposition  WFL   RUE Grip Strength  WFL   LUE WFL   LUE Finger to Nose Test  [facility]   LUE Finger Opposition  WFL   LUE Grip Strength  WFL   Sensory   Proprioception WFL   Sensory Comments WFL - pt denies numbness in BLEs   Vision - Complex Assessment   Ocular Range of Motion WFL   Head Position WDL   Tracking Able to track stimulus in all quads without difficulty   Glasses yes   Acuity Able to read clock/calendar on wall without difficulty;Able to read employee name badge without difficulty   Vision Comments pt denies any changes in vision; blurred vision or diplopia   Skin Integrity   Skin Integrity RLE edema; Redness around surgical stitches   Edema    Edema scale +1 Barely discernible pit   Pain Assessment   Pain Assessment Numeric Scale (0-10)   Pain Score 5   POSS Score 1   Pain Location Back   Pain Orientation Lower;Mid   Pain Descriptors Constant;Discomfort;Aching  (pt reports pain is likely d/t sitting in new wc)   Multiple Pain Sites No   Pain Comments pt reports no R hip pain today and reports lower back pain possibly  d/t new wc   Gross ROM   Gross ROM WFL   Neck/Trunk ROM WFL   Right Upper Extremity ROM WFL   Left Upper Extremity ROM WFL   Tone   Tone WFL   Gross Strength   Neck/Trunk Strength 5/5   Right Upper Extremity Strength 5/5   Left Upper Extremity Strength 5/5   Eating   Eating Device/Technique Regular untensil   Goal Eating Score 7-Independent   Discharge Eating Score 7-Patient completes entire meal independently without any devices or modified diets   Grooming    Hands Patient   Face Patient   Hair Patient   Oral Patient   Shave/Makeup  Patient   Goal Score Grooming  7   Discharge Score Grooming  7-Patient  completes all desired grooming tasks independently without any devices   Bathing  Chest Patient   Right Arm Patient   Left Arm  Patient   Abdomen Patient   Front Perineal Area Patient   Back Perineal Areas (includes buttocks) Patient   Left Upper Leg Patient   Right Upper Leg Patient   Left Lower Leg (include foot) Patient   Right Lower Leg (include foot) Patient   Device/Techniques Seated at sink;Standing at sink;Reacher  (toilet aide)   Goal Score Bathing  6-Mod I   Discharge Score Bathing  5-Patient requires setup assistance or gathering of items;5b-Patient requires assistance to apply assistive devices or orthoses   Dressing- Upper Body   Shirt 1 Right Arm  Patient   Shirt 1 Left Arm  Patient   Shirt 1 Overhead Patient   Shirt 1 Arrange in Back Patient   Device/Techniques Seated at sink  (reacher)   Goal Dressing Score  7-Independent   Discharge Dressing Score  6-Patient requires more than a reasonable amount of time to complete desired upper body dressing tasks;6a-Patient uses assistive devices independently to complete desired upper body dressing tasks   Dressing- Lower Body   Underwear Right Leg Patient   Underwear Left Leg Patient   Underwear Arrange Over Hips Patient   Pants Right Leg Patient   Pants Left Leg Patient   Pants Arrange Over Hips Patient   Right Sock Patient   Left Shoe Patient   Device/Technique  Reacher;Sock aid;Long handled shoe horn;Seated at sink;Standing at sink   Goal Score Dressing  6-Mod1   Discharge Dressing Score  6-Patient requires more than a reasonable amount of time to complete desired lower body dressing tasks;6a-Patient uses assistive devices independently to complete desired lower body dressing tasks;6b-Patient completes desired lower body dressing tasks, however there are safety considerations   Toileting    Pants Down Patient   Hygiene Patient   Pants Up  Patient   Device/Technique Safety frame (balance);Grab bar (balance);Walker (balance)   Goal Score Toileting 6-Mod1    Discharge Score Toileting 4-Patient completes 100% of toileting tasks but needs steadying of only one person   Toilet Transfers - Ambulatory   Toilet Transfer Score  0-Activity did not occur upon evaluation   Toilet Transfers - Wheelchair   Approaches Bed or Chair Patient   Locks Brakes Patient   Lifts Foot Rests Helper   Device/Techniques-Wheelchair FWW   Goal Score Toilet Transfer  6-Mod I   Discharge Score Toilet Transfer  4a-Patient requires assistance only to lift only one leg off of the leg rest;4-Patient requires steadying assistance of only one person to complete toilet transfer   Tub/Shower Transfer- Ambulatory   Tub/Shower Transfer Score  0-Activity did not occur upon evaluation   Tub/Shower Transfers - Designer, jewellery Score  0 - Activity did not occur upon evaluation   Teacher, early years/pre Standing Balance Supervision   Dynamic Standing Balance Contact Guard Assist   Patient Education   Patient Education d/c recs and safety measures; resistance band exercises   Assessment   Strengths independent PLOF;active participant;good family support;cognition intact;owns equipment;good UE control;good UE strength;good sitting balance;vision intact;sensation intact   Limitations impaired standing balance;impaired LB strength;pain limiting activity   Assessment Comment Pt d/c 06/20/18 to one-level basement of friend's home in [place] with 2 STE. Pt has friend available 24/7 supervision while staying in [place]. Pt partially met Mod I levels of LTG. Pt is Mod I in UB&LB dressing using AE PRN. Pt  is Sup A for bathing d/t AE and sink bathing set-up; Min A toileting and toilet transfers d/t requiring steadying assist and AE assistance. Pt has f/u w/ Dr. Emeline Darling mid-August re R hip incision and WB status. Pt plans to return home after f/u to [facility] to a one-level apartment, w/ sister and friend available supervision prn. Pt  educated on and given resistance band exercises for HEP.  DME and safety recs provided upon d/c. Rec HHOT.   OT Goals   New Short Term Goals STG = LTG d/t ELOS (partially met)   Long Term Goals By d/c pt to be at mod I level with self care, functional transfers, light home mgmt with AE AT DME prn. = PARTIALLY MET   OT Plan   Treatment/Interventions ADL Training;Improving Balance/Tolerance;DME Training;Patient/Family Education;Techniques to Improve Skin/Joint Integrity;Techniques for Energy Building surveyor;Improving UB Strength/Endurance;Positioning   Discharge Recommendation Home with home health OT   DME Recommended for Discharge Grab Bars in Shower;Transfer Tub Bench   Estimated Length of Stay (Days) 0   Discharge Date 06/20/18       Rita Ohara, MS, OTR/L

## 2018-06-24 NOTE — Plan of Care (Signed)
Problem: Occupational Therapy  Goal: By discharge, patient will perform self-care at the patient's highest functional potential.  See OT evaluation/note for goals.  Outcome: Adequate for Discharge

## 2018-07-07 ENCOUNTER — Other Ambulatory Visit: Payer: Self-pay | Admitting: Cardiovascular Disease

## 2018-07-07 MED ORDER — SULFAMETHOXAZOLE-TRIMETHOPRIM 800-160 MG PO TABS
2.00 | ORAL_TABLET | Freq: Two times a day (BID) | ORAL | 0 refills | Status: AC
Start: 2018-07-07 — End: 2018-07-17

## 2018-07-07 NOTE — Progress Notes (Signed)
Reviewed post rehab services of [facility] PT/OT/Nsg  Family training completed pt directs own care so no FT  Family conference none indicated  Patient and family state readiness to provide care at home at pt's current functional level.   Patient had no expressed [place] issues or concerns.  Patient met rehab goals and personal goal of being independent enough to go to friends home and live in a basement apartment with friends support. Pt has his home health information and rec dme. Pt is pleased with his progress and is looking forward to going to friends house with [facility] services.  Case closed.

## 2018-07-11 ENCOUNTER — Encounter (INDEPENDENT_AMBULATORY_CARE_PROVIDER_SITE_OTHER): Payer: Self-pay

## 2018-07-17 ENCOUNTER — Other Ambulatory Visit: Payer: Self-pay | Admitting: *Deleted

## 2018-07-17 DIAGNOSIS — E1159 Type 2 diabetes mellitus with other circulatory complications: Secondary | ICD-10-CM

## 2018-07-18 MED ORDER — EMPAGLIFLOZIN 10 MG PO TABS: ORAL_TABLET | ORAL | 0 refills | Status: DC

## 2018-08-19 ENCOUNTER — Other Ambulatory Visit: Payer: Self-pay | Admitting: *Deleted

## 2018-08-19 DIAGNOSIS — I484 Atypical atrial flutter: Secondary | ICD-10-CM

## 2018-08-20 NOTE — Telephone Encounter (Signed)
Appears that his cardiologist was refilling his eliquis and he should contact their office for this refill, please inform patient.

## 2018-08-29 ENCOUNTER — Telehealth: Payer: Self-pay | Admitting: *Deleted

## 2018-08-29 ENCOUNTER — Other Ambulatory Visit: Payer: Self-pay | Admitting: *Deleted

## 2018-08-29 MED ORDER — CARVEDILOL 12.5 MG PO TABS: 12.5000 mg | ORAL_TABLET | Freq: Two times a day (BID) | ORAL | 2 refills | Status: DC

## 2018-08-29 NOTE — Telephone Encounter (Signed)
Spoke with patient and made him aware of msg below per Dr Elease Hashimoto. Informed him that I have sent the rx to his preferred pharmacy as previously discussed. He verbalized understanding and appreciation.

## 2018-08-29 NOTE — Telephone Encounter (Signed)
Coreg 12.5 BID will be fine We will reassess when he comes back to the office

## 2018-08-29 NOTE — Telephone Encounter (Signed)
Patient called and requested a refill on carvedilol. He stated that his dose was changed to 12.5 mg bid by Dr Lorenso Courier at [facility] in [place] back in July when he had his second surgery. He reports that he is tolerating this dose well. He tells me that he is unable to come in for an office visit as he is immobile at this time and I made him aware that he isn't due for a follow up appointment until December. He states that he will be mobile before then and is willing to come in sooner if necessary due to this med change. Okay to refill at reported dose? Please advise. Thanks, MI

## 2018-09-03 ENCOUNTER — Other Ambulatory Visit: Payer: Self-pay | Admitting: *Deleted

## 2018-09-03 DIAGNOSIS — I484 Atypical atrial flutter: Secondary | ICD-10-CM

## 2018-09-03 MED ORDER — APIXABAN 5 MG PO TABS: 5.0000 mg | ORAL_TABLET | Freq: Two times a day (BID) | ORAL | 2 refills | Status: DC

## 2018-09-03 NOTE — Telephone Encounter (Signed)
Eliquis 5mg  refill request received; pt is 62 yrs old, wt-141.9kg, Crea-1.02 at [facility] per [facility], last seen by Vin on 05/13/18; will send in refill to requested pharmacy.

## 2018-09-17 ENCOUNTER — Encounter (HOSPITAL_COMMUNITY): Payer: Self-pay | Admitting: *Deleted

## 2018-09-17 ENCOUNTER — Emergency Department (HOSPITAL_COMMUNITY)
Admission: EM | Admit: 2018-09-17 | Discharge: 2018-09-17 | Disposition: A | Discharge status: Home / Self Care | Payer: BLUE CROSS/BLUE SHIELD | Attending: Emergency Medicine | Admitting: Emergency Medicine

## 2018-09-17 ENCOUNTER — Emergency Department (HOSPITAL_COMMUNITY): Payer: BLUE CROSS/BLUE SHIELD

## 2018-09-17 DIAGNOSIS — I2510 Atherosclerotic heart disease of native coronary artery without angina pectoris: Secondary | ICD-10-CM | POA: Diagnosis not present

## 2018-09-17 DIAGNOSIS — E119 Type 2 diabetes mellitus without complications: Secondary | ICD-10-CM | POA: Insufficient documentation

## 2018-09-17 DIAGNOSIS — L03115 Cellulitis of right lower limb: Secondary | ICD-10-CM | POA: Insufficient documentation

## 2018-09-17 DIAGNOSIS — Z79899 Other long term (current) drug therapy: Secondary | ICD-10-CM | POA: Insufficient documentation

## 2018-09-17 DIAGNOSIS — M60061 Infective myositis, right lower leg: Secondary | ICD-10-CM | POA: Diagnosis not present

## 2018-09-17 DIAGNOSIS — M60003 Infective myositis, unspecified right leg: Secondary | ICD-10-CM

## 2018-09-17 DIAGNOSIS — Z7984 Long term (current) use of oral hypoglycemic drugs: Secondary | ICD-10-CM | POA: Diagnosis not present

## 2018-09-17 DIAGNOSIS — I10 Essential (primary) hypertension: Secondary | ICD-10-CM | POA: Insufficient documentation

## 2018-09-17 LAB — CBC WITH DIFFERENTIAL/PLATELET
Abs Immature Granulocytes: 0.03 10*3/uL (ref 0.00–0.07)
Basophils Absolute: 0.1 10*3/uL (ref 0.0–0.1)
Basophils Relative: 1 %
Eosinophils Absolute: 0.2 10*3/uL (ref 0.0–0.5)
Eosinophils Relative: 2 %
HCT: 36.7 % — ABNORMAL LOW (ref 39.0–52.0)
Hemoglobin: 10.6 g/dL — ABNORMAL LOW (ref 13.0–17.0)
Immature Granulocytes: 0 %
Lymphocytes Relative: 16 %
Lymphs Abs: 1.4 10*3/uL (ref 0.7–4.0)
MCH: 25 pg — ABNORMAL LOW (ref 26.0–34.0)
MCHC: 28.9 g/dL — ABNORMAL LOW (ref 30.0–36.0)
MCV: 86.6 fL (ref 80.0–100.0)
Monocytes Absolute: 0.8 10*3/uL (ref 0.1–1.0)
Monocytes Relative: 10 %
Neutro Abs: 6.1 10*3/uL (ref 1.7–7.7)
Neutrophils Relative %: 71 %
Platelets: 351 10*3/uL (ref 150–400)
RBC: 4.24 MIL/uL (ref 4.22–5.81)
RDW: 18.1 % — ABNORMAL HIGH (ref 11.5–15.5)
WBC: 8.6 10*3/uL (ref 4.0–10.5)
nRBC: 0 % (ref 0.0–0.2)

## 2018-09-17 LAB — COMPREHENSIVE METABOLIC PANEL
ALT: 24 U/L (ref 0–44)
AST: 21 U/L (ref 15–41)
Albumin: 3.5 g/dL (ref 3.5–5.0)
Alkaline Phosphatase: 155 U/L — ABNORMAL HIGH (ref 38–126)
Anion gap: 7 (ref 5–15)
BUN: 20 mg/dL (ref 8–23)
CO2: 22 mmol/L (ref 22–32)
Calcium: 9.1 mg/dL (ref 8.9–10.3)
Chloride: 108 mmol/L (ref 98–111)
Creatinine, Ser: 0.86 mg/dL (ref 0.61–1.24)
GFR calc Af Amer: >60 mL/min (ref >60)
GFR calc non Af Amer: >60 mL/min (ref >60)
Glucose, Bld: 98 mg/dL (ref 70–99)
Potassium: 4.3 mmol/L (ref 3.5–5.1)
Sodium: 137 mmol/L (ref 135–145)
Total Bilirubin: 0.7 mg/dL (ref 0.3–1.2)
Total Protein: 7.5 g/dL (ref 6.5–8.1)

## 2018-09-17 LAB — SEDIMENTATION RATE: Sed Rate: 63 mm/hr — ABNORMAL HIGH (ref 0–16)

## 2018-09-17 LAB — C-REACTIVE PROTEIN: CRP: 3.5 mg/dL — ABNORMAL HIGH (ref <1.0)

## 2018-09-17 LAB — I-STAT CG4 LACTIC ACID, ED: Lactic Acid, Venous: 1.04 mmol/L (ref 0.5–1.9)

## 2018-09-17 MED ORDER — CLINDAMYCIN HCL 150 MG PO CAPS
300.0000 mg | ORAL_CAPSULE | Freq: Once | ORAL | Status: AC
  Administered 2018-09-17: 300 mg via ORAL
  Filled 2018-09-17: qty 2

## 2018-09-17 MED ORDER — CLINDAMYCIN HCL 150 MG PO CAPS: 300.0000 mg | ORAL_CAPSULE | Freq: Four times a day (QID) | ORAL | 0 refills | Status: DC

## 2018-09-17 MED ORDER — FLORANEX PO PACK: 1.0000 g | PACK | Freq: Three times a day (TID) | ORAL | 0 refills | Status: DC

## 2018-09-17 MED ORDER — IOHEXOL 300 MG/ML  SOLN
100.0000 mL | Freq: Once | INTRAMUSCULAR | Status: AC | PRN
  Administered 2018-09-17: 100 mL via INTRAVENOUS

## 2018-09-17 NOTE — Discharge Instructions (Signed)
Please read and follow all provided instructions.  Your diagnoses today include:  1. Cellulitis of right lower extremity   2. Infective myositis of right lower extremity     Tests performed today include:  Vital signs. See below for your results today.   Blood counts and electrolytes -normal white blood cell count  Inflammatory markers -are elevated  X-ray of your leg  CT of your leg -we do not see any definite bone infection or collections of pus.  It does show infection in the skin and swelling of the underlying muscle.  Medications prescribed:   Clindamycin - antibiotic  You have been prescribed an antibiotic medicine: take the entire course of medicine even if you are feeling better. Stopping early can cause the antibiotic not to work.  Take any prescribed medications only as directed.   Home care instructions:  Follow any educational materials contained in this packet. Keep affected area above the level of your heart when possible. Wash area gently twice a day with warm soapy water. Do not apply alcohol or hydrogen peroxide. Cover the area if it draining or weeping.   Follow-up instructions: Return to the Emergency Department in 48 hours for a recheck if your symptoms are not significantly improved.   Please follow-up with your primary care provider in the next 1 week for further evaluation of your symptoms.   Return instructions:  Return to the Emergency Department if you have:  Fever  Worsening symptoms  Worsening pain  Worsening swelling  Redness of the skin that moves away from the affected area, especially if it streaks away from the affected area   Any other emergent concerns  Your vital signs today were: BP (!) 144/89    Pulse 75    Temp 98.1 F (36.7 C)    Resp 20    SpO2 98%  If your blood pressure (BP) was elevated above 135/85 this visit, please have this repeated by your doctor within one month. --------------
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
## 2018-09-17 NOTE — ED Notes (Signed)
Patient transported to CT 

## 2018-09-17 NOTE — ED Triage Notes (Signed)
Pt in from home c/o cellulitis to his right upper leg, pt had a hip replacement in June and then fell the next week and had to have a revision and femur repair, since that time pt has had multiple incision site infections, recently completed a round of antibiotics on Saturday which improved redness but as soon as medication was completed it returned, denies fevers, no distress noted

## 2018-09-17 NOTE — ED Notes (Signed)
Patient verbalized understanding of discharge instructions and denies any further needs or questions at this time. VS stable. Patient assisted into own wheelchair and escorted to ED entrance.

## 2018-10-11 ENCOUNTER — Other Ambulatory Visit: Payer: Self-pay | Admitting: Family Medicine

## 2018-10-11 DIAGNOSIS — E1159 Type 2 diabetes mellitus with other circulatory complications: Secondary | ICD-10-CM

## 2018-10-13 NOTE — Telephone Encounter (Signed)
LVM for pt to call the office. If pt calls, please help him schedule an appt for DM, med refill. Sunday Spillers, CMA

## 2018-10-13 NOTE — Telephone Encounter (Signed)
Will send one month of jardiance, but looks like patient is due to see me (I haven't met him yet). Please schedule my next available in the next month with him for DM follow up.

## 2018-10-13 NOTE — Telephone Encounter (Signed)
Appointment scheduled for 11/19/2018. Sunday Spillers, CMA

## 2018-11-10 ENCOUNTER — Ambulatory Visit: Payer: BLUE CROSS/BLUE SHIELD | Admitting: Cardiovascular Disease

## 2018-11-10 ENCOUNTER — Encounter: Payer: Self-pay | Admitting: Cardiovascular Disease

## 2018-11-10 VITALS — BP 150/90 | HR 97 | Ht 76.0 in | Wt 309.0 lb

## 2018-11-10 DIAGNOSIS — I484 Atypical atrial flutter: Secondary | ICD-10-CM

## 2018-11-10 DIAGNOSIS — I4819 Other persistent atrial fibrillation: Secondary | ICD-10-CM | POA: Diagnosis not present

## 2018-11-10 MED ORDER — AMLODIPINE BESYLATE 10 MG PO TABS: 10.0000 mg | ORAL_TABLET | Freq: Every day | ORAL | 3 refills | Status: DC
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
## 2018-11-10 NOTE — Patient Instructions (Addendum)
Medication Instructions:  Your physician recommends that you continue on your current medications as directed. Please refer to the Current Medication list given to you today.  A refill of your Amlodipine is at your pharmacy  If you need a refill on your cardiac medications before your next appointment, please call your pharmacy.   Lab work: None Ordered    Testing/Procedures: None Ordered   Follow-Up: You have been referred to A Fib Clinic    At [facility], you and your health needs are our priority.  As part of our continuing mission to provide you with exceptional heart care, we have created designated Provider Care Teams.  These Care Teams include your primary Cardiologist (physician) and Advanced Practice Providers (APPs -  Physician Assistants and Nurse Practitioners) who all work together to provide you with the care you need, when you need it. You will need a follow up appointment in:  1 years.  Please call our office 2 months in advance to schedule this appointment.  You may see Kristeen MissPhilip Nahser, MD or one of the following Advanced Practice Providers on your designated Care Team: Tereso NewcomerScott Weaver, PA-C Vin TyroneBhagat, [place] JerseyPA-C . Berton BonJanine Hammond, NP

## 2018-11-19 ENCOUNTER — Ambulatory Visit (INDEPENDENT_AMBULATORY_CARE_PROVIDER_SITE_OTHER): Payer: BLUE CROSS/BLUE SHIELD | Admitting: Family Medicine

## 2018-11-19 ENCOUNTER — Other Ambulatory Visit: Payer: Self-pay

## 2018-11-19 ENCOUNTER — Ambulatory Visit: Payer: BLUE CROSS/BLUE SHIELD | Admitting: Family Medicine

## 2018-11-19 ENCOUNTER — Ambulatory Visit (HOSPITAL_COMMUNITY): Payer: BLUE CROSS/BLUE SHIELD | Admitting: Nurse Practitioner

## 2018-11-19 ENCOUNTER — Encounter: Payer: Self-pay | Admitting: Family Medicine

## 2018-11-19 VITALS — BP 102/58 | HR 72 | Temp 98.1°F | Ht 76.0 in | Wt 305.4 lb

## 2018-11-19 DIAGNOSIS — E1159 Type 2 diabetes mellitus with other circulatory complications: Secondary | ICD-10-CM | POA: Diagnosis not present

## 2018-11-19 DIAGNOSIS — I10 Essential (primary) hypertension: Secondary | ICD-10-CM

## 2018-11-19 DIAGNOSIS — R21 Rash and other nonspecific skin eruption: Secondary | ICD-10-CM | POA: Diagnosis not present

## 2018-11-19 DIAGNOSIS — Z1211 Encounter for screening for malignant neoplasm of colon: Secondary | ICD-10-CM | POA: Diagnosis not present

## 2018-11-19 DIAGNOSIS — Z23 Encounter for immunization: Secondary | ICD-10-CM

## 2018-11-19 DIAGNOSIS — M1611 Unilateral primary osteoarthritis, right hip: Secondary | ICD-10-CM

## 2018-11-19 LAB — POCT GLYCOSYLATED HEMOGLOBIN (HGB A1C): HbA1c, POC (controlled diabetic range): 6.3 % (ref 0.0–7.0)

## 2018-11-19 MED ORDER — CLOBETASOL PROPIONATE 0.05 % EX LOTN: TOPICAL_LOTION | CUTANEOUS | 0 refills | Status: AC

## 2018-11-19 NOTE — Progress Notes (Deleted)
   CC: ***  HPI  ROS: ***Denies CP, SOB, abdominal pain, dysuria, changes in BMs.   CC, SH/smoking status, and VS noted  Objective: There were no vitals taken for this visit. Gen: NAD, alert, cooperative, and pleasant.*** HEENT: NCAT, EOMI, PERRL CV: RRR, no murmur Resp: CTAB, no wheezes, non-labored Abd: SNTND, BS present, no guarding or organomegaly Ext: No edema, warm Neuro: Alert and oriented, Speech clear, No gross deficits  Assessment and plan:  No problem-specific Assessment & Plan notes found for this encounter.   No orders of the defined types were placed in this encounter.   No orders of the defined types were placed in this encounter.   Health Maintenance reviewed - {health maintenance:315237}.  Loni MuseKate Timberlake, MD, PGY3 11/19/2018 11:32 AM
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
## 2018-11-19 NOTE — Patient Instructions (Addendum)
It was a pleasure to see you today! Thank you for choosing [facility] for your primary care. Steve [name] was seen for diabetes.   Our plans for today were:  You are doing well with your diabetes.   Next time you go for your eye exams, please have them to fax the records to [place] at [phone].   Please do the home kit to check for blood in your stool which helps [place] look for colon cancer.   For your burn, apply the gauze we gave you (change daily) and then when you run out just use vaseline with gauze over top. Call if it gets more red or you are concerned with infection.    Best,  Dr. Lindell Noe

## 2018-11-20 LAB — CMP14+EGFR
ALT: 23 IU/L (ref 0–44)
AST: 19 IU/L (ref 0–40)
Albumin/Globulin Ratio: 1.2 (ref 1.2–2.2)
Albumin: 4.1 g/dL (ref 3.6–4.8)
Alkaline Phosphatase: 173 IU/L — ABNORMAL HIGH (ref 39–117)
BUN/Creatinine Ratio: 24 (ref 10–24)
BUN: 24 mg/dL (ref 8–27)
Bilirubin Total: 0.8 mg/dL (ref 0.0–1.2)
CO2: 19 mmol/L — ABNORMAL LOW (ref 20–29)
Calcium: 9.2 mg/dL (ref 8.6–10.2)
Chloride: 100 mmol/L (ref 96–106)
Creatinine, Ser: 0.99 mg/dL (ref 0.76–1.27)
GFR calc Af Amer: 95 mL/min/{1.73_m2} (ref >59)
GFR calc non Af Amer: 82 mL/min/{1.73_m2} (ref >59)
Globulin, Total: 3.4 g/dL (ref 1.5–4.5)
Glucose: 136 mg/dL — ABNORMAL HIGH (ref 65–99)
Potassium: 5.1 mmol/L (ref 3.5–5.2)
Sodium: 137 mmol/L (ref 134–144)
Total Protein: 7.5 g/dL (ref 6.0–8.5)

## 2018-11-20 LAB — CBC
Hematocrit: 33.6 % — ABNORMAL LOW (ref 37.5–51.0)
Hemoglobin: 10.9 g/dL — ABNORMAL LOW (ref 13.0–17.7)
MCH: 26.3 pg — ABNORMAL LOW (ref 26.6–33.0)
MCHC: 32.4 g/dL (ref 31.5–35.7)
MCV: 81 fL (ref 79–97)
Platelets: 359 10*3/uL (ref 150–450)
RBC: 4.14 x10E6/uL (ref 4.14–5.80)
RDW: 18.3 % — ABNORMAL HIGH (ref 12.3–15.4)
WBC: 10.4 10*3/uL (ref 3.4–10.8)

## 2018-11-20 NOTE — Assessment & Plan Note (Signed)
[place]. Progressing well with PT per his report.

## 2018-11-20 NOTE — Assessment & Plan Note (Signed)
Well controlled, continue current plan check labs today.

## 2018-11-20 NOTE — Progress Notes (Signed)
Duplicate encounter opened as patient arrived late. Will charge as no charge.

## 2018-11-20 NOTE — Assessment & Plan Note (Signed)
Currently on janumet, jardiance, and glipizide. a1c in goal. i'd like to stop the glipizide, patient is amenable to this. Originally planned on recheck in 6 month, but we will check at 3081m with this change.

## 2018-12-10 ENCOUNTER — Ambulatory Visit (HOSPITAL_COMMUNITY): Payer: BLUE CROSS/BLUE SHIELD | Admitting: Nurse Practitioner

## 2018-12-17 ENCOUNTER — Ambulatory Visit (HOSPITAL_COMMUNITY)
Admission: RE | Admit: 2018-12-17 | Discharge: 2018-12-17 | Disposition: A | Discharge status: Home / Self Care | Payer: BLUE CROSS/BLUE SHIELD | Source: Ambulatory Visit | Attending: Nurse Practitioner | Admitting: Nurse Practitioner

## 2018-12-17 ENCOUNTER — Encounter (HOSPITAL_COMMUNITY): Payer: Self-pay | Admitting: Nurse Practitioner

## 2018-12-17 VITALS — BP 142/84 | HR 84 | Ht 76.0 in | Wt 305.0 lb

## 2018-12-17 DIAGNOSIS — I10 Essential (primary) hypertension: Secondary | ICD-10-CM | POA: Diagnosis not present

## 2018-12-17 DIAGNOSIS — Z8249 Family history of ischemic heart disease and other diseases of the circulatory system: Secondary | ICD-10-CM | POA: Diagnosis not present

## 2018-12-17 DIAGNOSIS — R9431 Abnormal electrocardiogram [ECG] [EKG]: Secondary | ICD-10-CM | POA: Diagnosis not present

## 2018-12-17 DIAGNOSIS — I2510 Atherosclerotic heart disease of native coronary artery without angina pectoris: Secondary | ICD-10-CM | POA: Diagnosis not present

## 2018-12-17 DIAGNOSIS — Z951 Presence of aortocoronary bypass graft: Secondary | ICD-10-CM | POA: Diagnosis not present

## 2018-12-17 DIAGNOSIS — I480 Paroxysmal atrial fibrillation: Secondary | ICD-10-CM | POA: Diagnosis present

## 2018-12-17 DIAGNOSIS — Z7901 Long term (current) use of anticoagulants: Secondary | ICD-10-CM | POA: Insufficient documentation

## 2018-12-17 DIAGNOSIS — Z79899 Other long term (current) drug therapy: Secondary | ICD-10-CM | POA: Insufficient documentation

## 2018-12-17 DIAGNOSIS — E119 Type 2 diabetes mellitus without complications: Secondary | ICD-10-CM | POA: Insufficient documentation

## 2018-12-17 DIAGNOSIS — I252 Old myocardial infarction: Secondary | ICD-10-CM | POA: Insufficient documentation
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
## 2018-12-29 ENCOUNTER — Other Ambulatory Visit: Payer: Self-pay | Admitting: Cardiovascular Disease

## 2018-12-29 DIAGNOSIS — I10 Essential (primary) hypertension: Secondary | ICD-10-CM

## 2019-01-06 ENCOUNTER — Encounter: Payer: Self-pay | Admitting: Family Medicine

## 2019-01-08 MED ORDER — JANUMET 50-500 MG PO TABS: 1.0000 | ORAL_TABLET | Freq: Two times a day (BID) | ORAL | 5 refills | Status: DC

## 2019-01-14 ENCOUNTER — Other Ambulatory Visit: Payer: Self-pay | Admitting: Family Medicine

## 2019-01-14 DIAGNOSIS — E1159 Type 2 diabetes mellitus with other circulatory complications: Secondary | ICD-10-CM

## 2019-01-15 ENCOUNTER — Telehealth: Payer: Self-pay | Admitting: Family Medicine

## 2019-01-15 NOTE — Telephone Encounter (Signed)
Joni Reining from [facility] called to check on the status of Dr. Chanetta Marshall filling out the surgical clearance form that was faxed to her on 01/09/19.  The best call back for Joni Reining with any questions is [phone].

## 2019-01-16 NOTE — Telephone Encounter (Signed)
Called Dr. Tasia Catchings office back, patient's cardiologist manages his eliquis. Gave Dr. Tasia Catchings staff Dr. Harvie Bridge name and office number.

## 2019-01-19 ENCOUNTER — Telehealth: Payer: Self-pay | Admitting: *Deleted

## 2019-01-19 NOTE — Telephone Encounter (Signed)
Patient with diagnosis of Aflutter on Eliquis for anticoagulation.    Procedure: removal of ingrown toenail Date of procedure: TBD  CHADS2-VASc score of  3 (CHF, HTN, AGE, DM2, stroke/tia x 2, CAD, AGE, male)  CrCl 163ml/min  Two days seems excessive for toenail extraction, but pt is low enough risk with CHASDSVASC of 3, would be acceptable to hold for 2 days prior to procedure and resume day after procedure at discretion of procedure provider.

## 2019-01-19 NOTE — Telephone Encounter (Signed)
   Primary Cardiologist: Kristeen Miss, MD  Chart reviewed as part of pre-operative protocol coverage. Given past medical history and time since last visit, based on ACC/AHA guidelines, Steve [name] would be at acceptable risk for the planned procedure without further cardiovascular testing. He is doing well. He was maintaining sinus rhythm when seen in afib clinic last month.   Pharmacy to review anticoagulation.   [place], [place] 01/19/2019, 10:18 AM

## 2019-01-19 NOTE — Telephone Encounter (Signed)
   [facility] Pre-operative Risk Assessment    Request for surgical clearance:  1. What type of surgery is being performed? REMOVAL OF INGROWN TOENAIL   2. When is this surgery scheduled? TBD   3. What type of clearance is required (medical clearance vs. Pharmacy clearance to hold med vs. Both)? BOTH  4. Are there any medications that need to be held prior to surgery and how long?ELIQUIS 2 DAYS PRIOR AS WELL AS THE DAY AFTER PROCEDURE    5. Practice name and name of physician performing surgery? [facility]. [facility]; DR/ TOMASZEWSKI, DPM  6. What is your office phone number  [phone]   7.   What is your office fax number [phone]  8.   Anesthesia type (None, local, MAC, general) ? NONE LISTED    Steve [name] 01/19/2019, 9:50 AM  _________________________________________________________________   (provider comments below)

## 2019-04-21 ENCOUNTER — Other Ambulatory Visit: Payer: Self-pay

## 2019-04-21 DIAGNOSIS — E1159 Type 2 diabetes mellitus with other circulatory complications: Secondary | ICD-10-CM

## 2019-04-22 MED ORDER — EMPAGLIFLOZIN 10 MG PO TABS: 10.0000 mg | ORAL_TABLET | Freq: Every day | ORAL | 0 refills | Status: DC

## 2019-05-11 ENCOUNTER — Encounter: Payer: Self-pay | Admitting: Family Medicine

## 2019-05-15 ENCOUNTER — Other Ambulatory Visit: Payer: Self-pay

## 2019-05-15 ENCOUNTER — Telehealth (INDEPENDENT_AMBULATORY_CARE_PROVIDER_SITE_OTHER): Payer: BC Managed Care – PPO | Admitting: Family Medicine

## 2019-05-15 DIAGNOSIS — E1159 Type 2 diabetes mellitus with other circulatory complications: Secondary | ICD-10-CM | POA: Diagnosis not present

## 2019-05-15 MED ORDER — EMPAGLIFLOZIN 25 MG PO TABS: 25.0000 mg | ORAL_TABLET | Freq: Every day | ORAL | 1 refills | Status: DC

## 2019-05-15 MED ORDER — GLUCOSE BLOOD VI STRP: ORAL_STRIP | 12 refills | Status: AC

## 2019-05-15 NOTE — Assessment & Plan Note (Addendum)
a1c out of goal at ortho in may. He self added back glipizide recently. Some lows since then.  Evidence review suggests safety and effectiveness of SGLT2 and DPP4 together. He is having some lows, which are likely due to glipizide. Additionally, I am concerned that if his CBGs are elevated 2/2 possible ortho infection, he may have further lows once this is treated if glipizide was continued. Offered either - split janumet back to [place] and metformin and increase metformin dose, or increase jardiance dose while stopping glipizide in both scenarios. He elected to increase jardiance and stop glipizide. Additionally, he is having a CT scan with and without contrast for his orthopedic procedure today.  He says he did not receive any guidance about whether to hold metformin prior to this procedure.  He already took the Janumet this morning.  I asked him not to take this evening's dose and tomorrow is 2 doses in order to attempt to mitigate any possible AKI that would occur with the contrast.  We will have a visit in 1 month for him to meet his new PCP and check a CBG log.

## 2019-05-15 NOTE — Progress Notes (Signed)
[place] Telemedicine Visit  Patient consented to have virtual visit. Method of visit: Telephone  Encounter participants: Patient: Steve [name] - located at home Provider: Ralene Ok - located at [facility]  Others (if applicable): none   Chief Complaint: DM recheck   HPI:  DM - he sent CBG log in mychart. He has had a couple of lows, and was woozy with this. Used 4oz of juice to bring this up. He has been checking TID to QID. a1c was 8.9 in early May. Prior to that, he was doing janumet, jardiance. He self added back glipizide. Both janumet and jardiance are $75 each per 90 days.  He is going to have some repeat ortho procedures soon, they are actually concerned about hardware infection.   ROS: per HPI  Pertinent PMHx: Type 2 diabetes  Exam:  Respiratory: Easily speaks in long sentences without increased shortness of breath.  Assessment/Plan:  Diabetes ([place]) a1c out of goal at ortho in may. He self added back glipizide recently. Some lows since then.  Evidence review suggests safety and effectiveness of SGLT2 and DPP4 together. He is having some lows, which are likely due to glipizide. Additionally, I am concerned that if his CBGs are elevated 2/2 possible ortho infection, he may have further lows once this is treated if glipizide was continued. Offered either - split janumet back to [place] and metformin and increase metformin dose, or increase jardiance dose while stopping glipizide in both scenarios. He elected to increase jardiance and stop glipizide. Additionally, he is having a CT scan with and without contrast for his orthopedic procedure today.  He says he did not receive any guidance about whether to hold metformin prior to this procedure.  He already took the Janumet this morning.  I asked him not to take this evening's dose and tomorrow is 2 doses in order to attempt to mitigate any possible AKI that would occur with the contrast.  We will have a  visit in 1 month for him to meet his new PCP and check a CBG log.   Needs follow up in 1 month for virtual or in person visit to meet new PCP And check CBG log.   Time spent during visit with patient: 15 minutes Ralene Ok, MD

## 2019-05-29 ENCOUNTER — Other Ambulatory Visit: Payer: Self-pay | Admitting: Cardiovascular Disease

## 2019-05-29 DIAGNOSIS — I484 Atypical atrial flutter: Secondary | ICD-10-CM

## 2019-05-29 MED ORDER — APIXABAN 5 MG PO TABS: 5.0000 mg | ORAL_TABLET | Freq: Two times a day (BID) | ORAL | 2 refills | Status: DC

## 2019-05-29 NOTE — Telephone Encounter (Signed)
Eliquis 5mg  paper refill request received from [facility] on [address]; pt is 63 yrs old, wt-138.5kg, Crea-0.99 on 11/19/2018, last seen by Roderic Palau in 12/17/2018 and Dr. Acie Fredrickson on 11/10/2018; will send in Eliquis refill to requested pharmacy.

## 2019-06-11 ENCOUNTER — Other Ambulatory Visit: Payer: Self-pay

## 2019-07-06 ENCOUNTER — Other Ambulatory Visit: Payer: Self-pay

## 2019-07-06 MED ORDER — JANUMET 50-500 MG PO TABS: 1.0000 | ORAL_TABLET | Freq: Two times a day (BID) | ORAL | 5 refills | Status: DC

## 2019-07-21 ENCOUNTER — Other Ambulatory Visit: Payer: Self-pay

## 2019-11-18 ENCOUNTER — Other Ambulatory Visit: Payer: Self-pay | Admitting: *Deleted

## 2019-11-18 ENCOUNTER — Telehealth: Payer: Self-pay | Admitting: Family Medicine

## 2019-11-18 MED ORDER — EMPAGLIFLOZIN 25 MG PO TABS: 25.0000 mg | ORAL_TABLET | Freq: Every day | ORAL | 1 refills | Status: DC

## 2019-11-18 NOTE — Telephone Encounter (Signed)
I have refilled [name]' Jardiance, but it looks like he is due for a visit with me to follow-up on his diabetes.  Please call him and schedule him for an appointment with me.  Thank you.

## 2019-11-19 NOTE — Telephone Encounter (Signed)
LVM for pt to call the office. Dr. Shan Levans would like pt to come in to follow up on his diabetes. If pt calls, please help him schedule and appt with Dr. Shan Levans. Ottis Stain, CMA

## 2019-11-22 ENCOUNTER — Other Ambulatory Visit: Payer: Self-pay | Admitting: Cardiovascular Disease

## 2019-12-13 ENCOUNTER — Other Ambulatory Visit: Payer: Self-pay | Admitting: Cardiovascular Disease

## 2019-12-13 DIAGNOSIS — I10 Essential (primary) hypertension: Secondary | ICD-10-CM

## 2019-12-17 ENCOUNTER — Other Ambulatory Visit: Payer: Self-pay | Admitting: Cardiovascular Disease

## 2020-01-05 ENCOUNTER — Encounter: Payer: Self-pay | Admitting: Physician Assistant

## 2020-01-05 ENCOUNTER — Other Ambulatory Visit: Payer: Self-pay

## 2020-01-05 ENCOUNTER — Ambulatory Visit: Payer: BC Managed Care – PPO | Admitting: Family Medicine

## 2020-01-05 VITALS — BP 118/70 | HR 73 | Ht 76.0 in | Wt 281.6 lb

## 2020-01-05 DIAGNOSIS — I480 Paroxysmal atrial fibrillation: Secondary | ICD-10-CM | POA: Diagnosis not present

## 2020-01-05 DIAGNOSIS — I10 Essential (primary) hypertension: Secondary | ICD-10-CM

## 2020-01-05 DIAGNOSIS — I25700 Atherosclerosis of coronary artery bypass graft(s), unspecified, with unstable angina pectoris: Secondary | ICD-10-CM | POA: Diagnosis not present

## 2020-01-05 DIAGNOSIS — E782 Mixed hyperlipidemia: Secondary | ICD-10-CM

## 2020-01-05 DIAGNOSIS — E119 Type 2 diabetes mellitus without complications: Secondary | ICD-10-CM

## 2020-01-05 MED ORDER — EZETIMIBE 10 MG PO TABS: 10.0000 mg | ORAL_TABLET | Freq: Every day | ORAL | 3 refills | Status: DC
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
## 2020-01-05 NOTE — Patient Instructions (Addendum)
Medication Instructions:   Your physician has recommended you make the following change in your medication:   1) Start Zetia 10 mg, 1 tablet by mouth once a day  *If you need a refill on your cardiac medications before your next appointment, please call your pharmacy*  Lab Work:  Your physician recommends that you return for lab work on 04/18/20 at 11:00AM  If you have labs (blood work) drawn today and your tests are completely normal, you will receive your results only by: Marland Kitchen MyChart Message (if you have MyChart) OR . A paper copy in the mail If you have any lab test that is abnormal or we need to change your treatment, we will call you to review the results.  Testing/Procedures:  None ordered today  Follow-Up: At [facility], you and your health needs are our priority.  As part of our continuing mission to provide you with exceptional heart care, we have created designated Provider Care Teams.  These Care Teams include your primary Cardiologist (physician) and Advanced Practice Providers (APPs -  Physician Assistants and Nurse Practitioners) who all work together to provide you with the care you need, when you need it.  Your next appointment:   6 month(s)  The format for your next appointment:   In Person  Provider:   You may see Kristeen Miss, MD or one of the following Advanced Practice Providers on your designated Care Team:    Tereso Newcomer, PA-C  Vin [place], [place]  [place]erton Bon, [place]

## 2020-01-13 ENCOUNTER — Other Ambulatory Visit: Payer: Self-pay | Admitting: *Deleted

## 2020-01-13 MED ORDER — JANUMET 50-500 MG PO TABS: 1.0000 | ORAL_TABLET | Freq: Two times a day (BID) | ORAL | 5 refills | Status: DC

## 2020-01-25 ENCOUNTER — Encounter (HOSPITAL_COMMUNITY): Payer: Self-pay | Admitting: Emergency Medicine

## 2020-01-25 ENCOUNTER — Emergency Department (HOSPITAL_COMMUNITY)
Admission: EM | Admit: 2020-01-25 | Discharge: 2020-01-25 | Disposition: A | Discharge status: Home / Self Care | Payer: BC Managed Care – PPO | Attending: Emergency Medicine | Admitting: Emergency Medicine

## 2020-01-25 ENCOUNTER — Ambulatory Visit (HOSPITAL_BASED_OUTPATIENT_CLINIC_OR_DEPARTMENT_OTHER): Payer: BC Managed Care – PPO

## 2020-01-25 ENCOUNTER — Other Ambulatory Visit: Payer: Self-pay

## 2020-01-25 DIAGNOSIS — E119 Type 2 diabetes mellitus without complications: Secondary | ICD-10-CM | POA: Diagnosis not present

## 2020-01-25 DIAGNOSIS — M79604 Pain in right leg: Secondary | ICD-10-CM | POA: Diagnosis present

## 2020-01-25 DIAGNOSIS — Z79899 Other long term (current) drug therapy: Secondary | ICD-10-CM | POA: Diagnosis not present

## 2020-01-25 DIAGNOSIS — I252 Old myocardial infarction: Secondary | ICD-10-CM | POA: Diagnosis not present

## 2020-01-25 DIAGNOSIS — M79609 Pain in unspecified limb: Secondary | ICD-10-CM

## 2020-01-25 DIAGNOSIS — I259 Chronic ischemic heart disease, unspecified: Secondary | ICD-10-CM | POA: Insufficient documentation

## 2020-01-25 DIAGNOSIS — Z7984 Long term (current) use of oral hypoglycemic drugs: Secondary | ICD-10-CM | POA: Diagnosis not present

## 2020-01-25 DIAGNOSIS — H35033 Hypertensive retinopathy, bilateral: Secondary | ICD-10-CM | POA: Diagnosis not present

## 2020-01-25 DIAGNOSIS — Z7901 Long term (current) use of anticoagulants: Secondary | ICD-10-CM | POA: Insufficient documentation

## 2020-01-25 DIAGNOSIS — M7989 Other specified soft tissue disorders: Secondary | ICD-10-CM | POA: Diagnosis not present

## 2020-01-25 NOTE — Progress Notes (Signed)
Right lower extremity venous duplex completed. Refer to "CV Proc" under chart review to view preliminary results.  01/25/2020 6:41 PM Eula Fried., MHA, RVT, RDCS, RDMS

## 2020-01-25 NOTE — ED Notes (Signed)
Pt verbalized understanding of DC instructions and follow up care.  

## 2020-01-25 NOTE — Discharge Instructions (Signed)
Ultrasound today did not show any evidence of any blood clots.  Follow-up with your primary care doctor.  Return emergency department for any worsening pain, swelling, fevers.
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
## 2020-01-25 NOTE — ED Triage Notes (Signed)
Pt. Stated, I might have a DVT, I started hang knee pain 2 days ago and its goine to my rt. Calf and sometimes radiates up my leg

## 2020-02-17 ENCOUNTER — Other Ambulatory Visit: Payer: Self-pay | Admitting: Cardiovascular Disease

## 2020-02-17 DIAGNOSIS — I484 Atypical atrial flutter: Secondary | ICD-10-CM

## 2020-02-17 NOTE — Telephone Encounter (Signed)
Eliquis 5mg  refill request received, pt is 64yrs old, weight-124.7kg, Crea-0.89 on 12/11/2019 via [facility] at [facility], Diagnosis-Afib, and last seen by 02/08/2020, NP on 01/05/2020. Dose is appropriate based on dosing criteria. Will send in refill to requested pharmacy.

## 2020-03-08 ENCOUNTER — Other Ambulatory Visit: Payer: Self-pay | Admitting: Cardiovascular Disease

## 2020-03-08 DIAGNOSIS — I10 Essential (primary) hypertension: Secondary | ICD-10-CM

## 2020-04-04 ENCOUNTER — Other Ambulatory Visit: Payer: BC Managed Care – PPO

## 2020-04-18 ENCOUNTER — Other Ambulatory Visit: Payer: BC Managed Care – PPO

## 2020-05-06 ENCOUNTER — Other Ambulatory Visit: Payer: BC Managed Care – PPO

## 2020-06-10 ENCOUNTER — Other Ambulatory Visit: Payer: Self-pay | Admitting: Family Medicine

## 2020-08-02 IMAGING — CT CT FEMUR *R* W/ CM
3 of 4 series · 10 of 35 positions shown, 12 images · IV contrast (agent unspecified)
Comparison: Plain films of the right femur earlier today.

CONTRAST:  100mL OMNIPAQUE IOHEXOL 300 MG/ML  SOLN

CLINICAL DATA: Right upper leg cellulitis. The patient underwent
right hip replacement in May 2018. He suffered a subsequent fall
requiring fixation of a proximal femur fracture. Initial encounter.

EXAM:
CT OF THE LOWER RIGHT EXTREMITY WITH CONTRAST
TECHNIQUE: Multidetector CT imaging of the lower right extremity was performed
according to the standard protocol following intravenous contrast
administration.

[Series 5: soft tissue · axial · 0.62mm/px · z∈[+382,+738]mm · 4 of 258 slices shown, 5 images]
[im 40/258  soft-tissue]
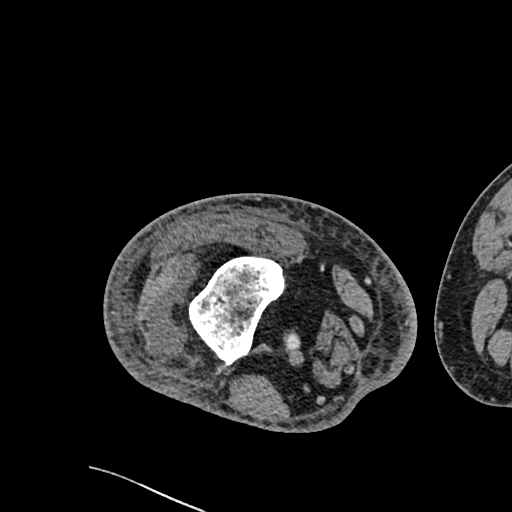
[im 40/258  bone]
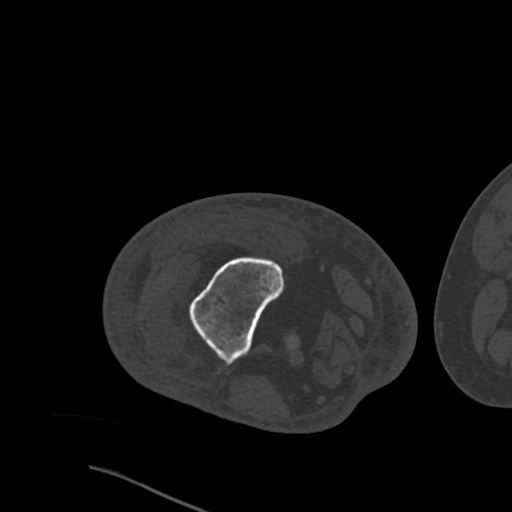
[im 99/258  bone]
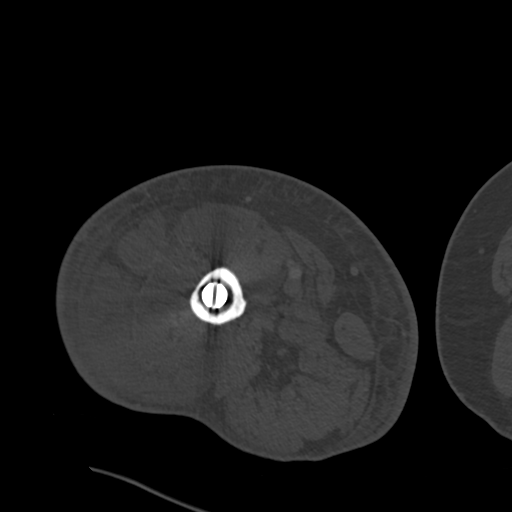
[im 159/258  bone]
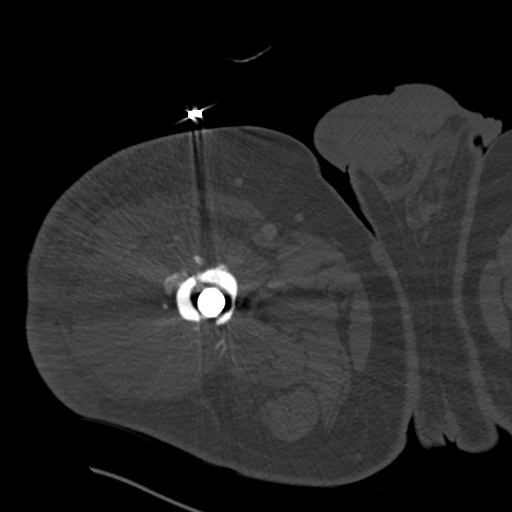
[im 218/258  bone]
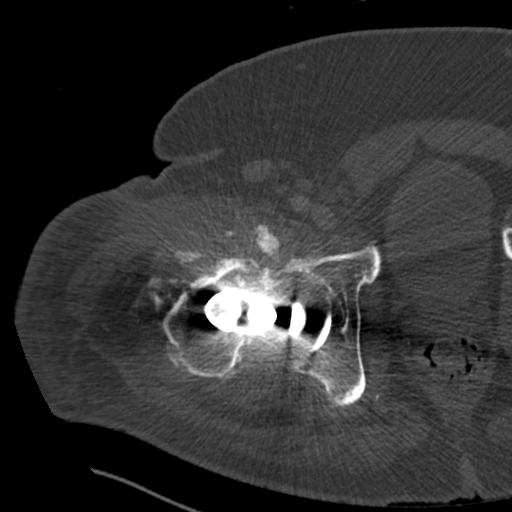

[Series 7: sag soft · sagittal · 0.61mm/px · 5 of 141 slices shown, 6 images]
[im 47/141  bone]
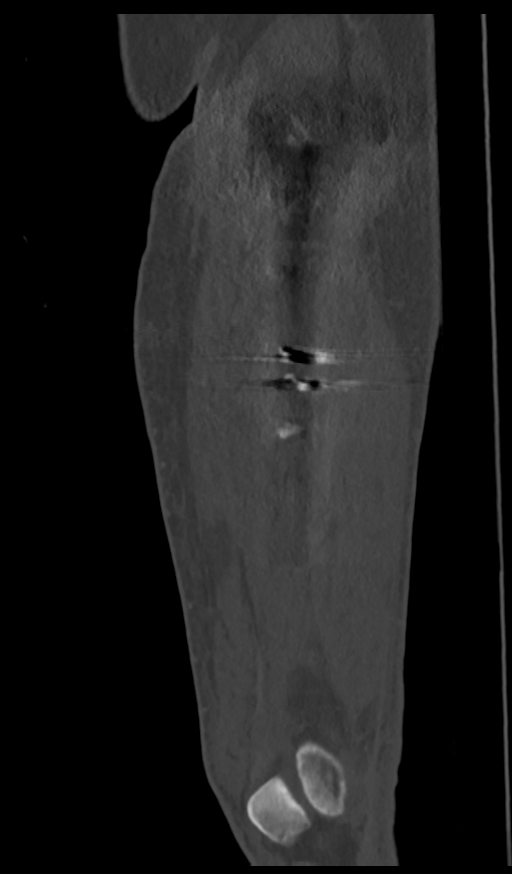
[im 59/141  bone]
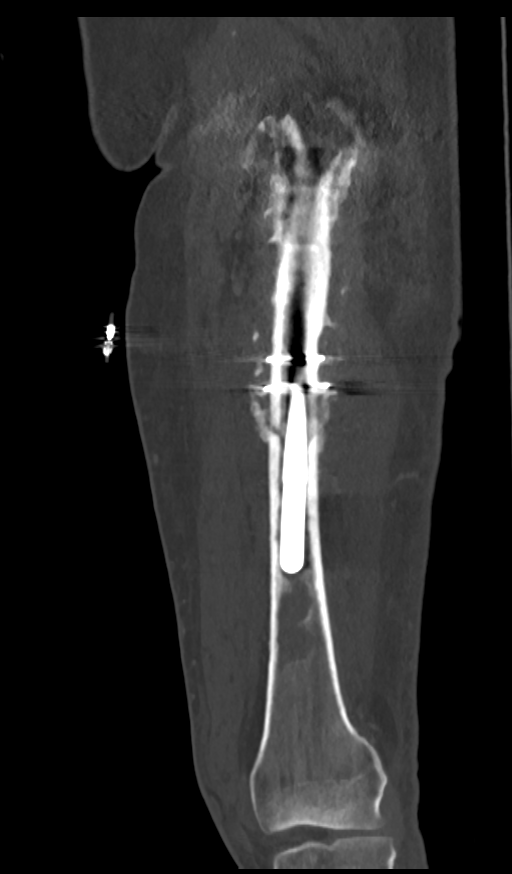
[im 71/141  soft-tissue]
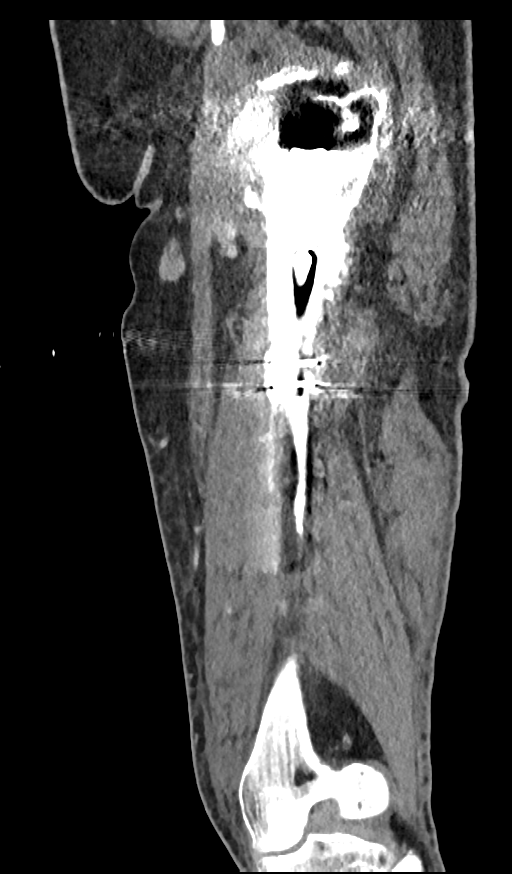
[im 71/141  bone]
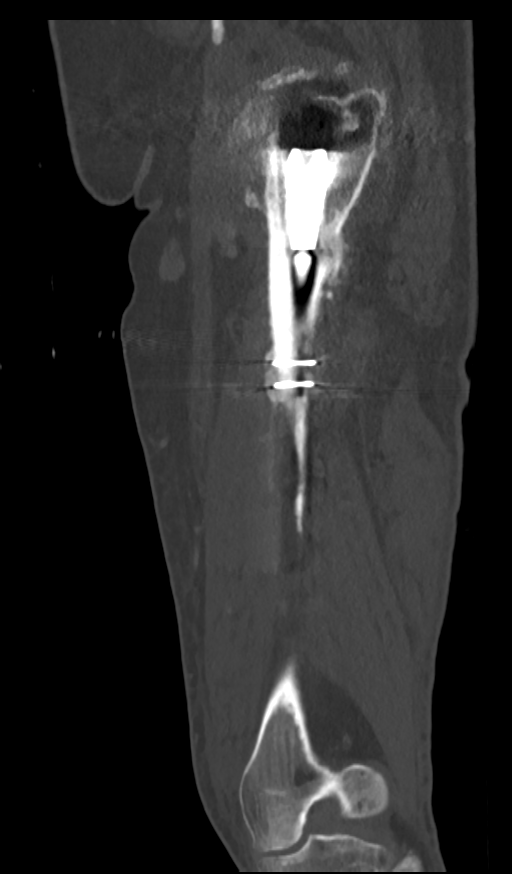
[im 82/141  bone]
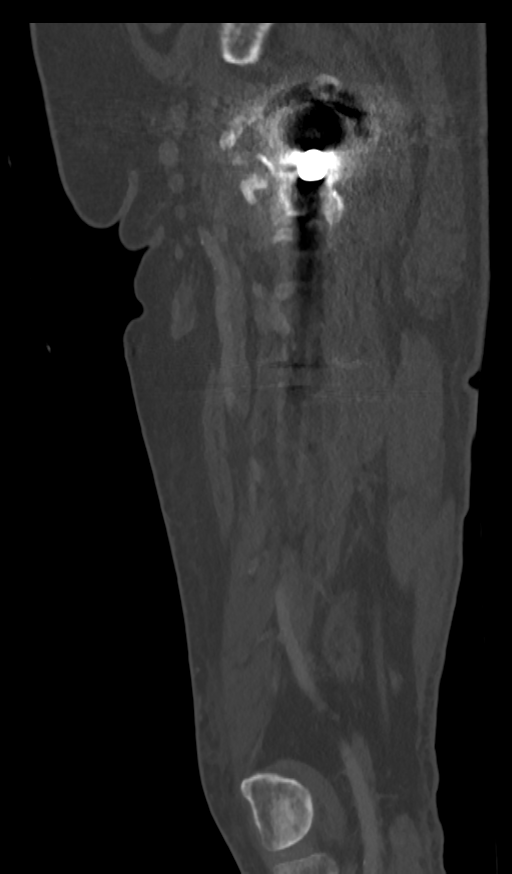
[im 94/141  bone]
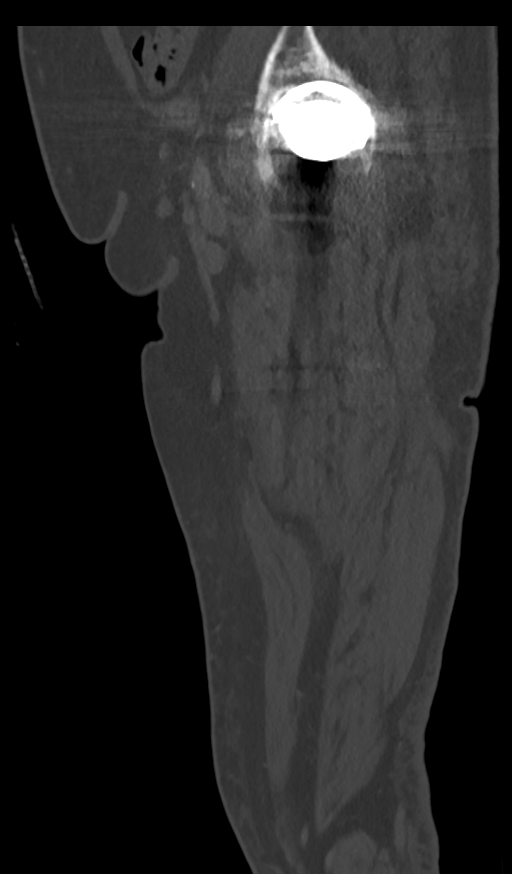

[Series 8: cor bone · coronal · 0.54mm/px · 1 of 156 slices shown]
[im 78/156  bone]
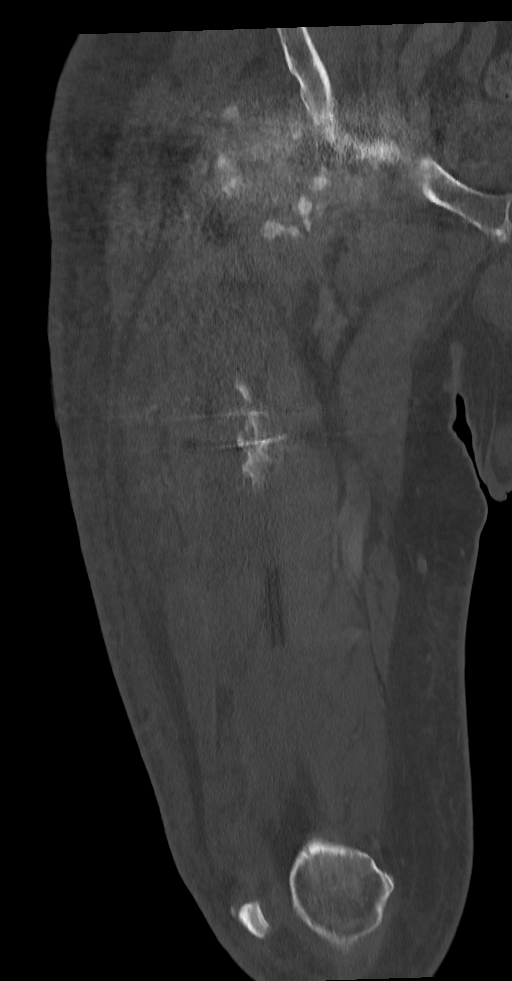

[10 of 35 positions shown; findings below may reference images not displayed]

FINDINGS: Bones/Joint/Cartilage

Right hip arthroplasty is in place. Two cables are present about the
proximal to mid diaphysis of the femur for fixation of a
periprosthetic fracture. There is some callus formation about the
fracture with scattered areas of bridging bone. There is a gap in
the posterior cortex of the femur measuring 1.2 cm transverse by
cm craniocaudal. Gap in the anterior cortex of the femur measuring
0.7 cm transverse by 3.4 cm craniocaudal is also identified. There
are scattered areas of bridging bone above and below these gaps. No
bony destructive change or other finding to suggest osteomyelitis is
identified.

Ligaments

Suboptimally assessed by CT.

Muscles and Tendons

Although artifact reduction techniques were used on the examination,
there is extensive streak artifact due to the patient's surgical
hardware which limits the examination. Despite streak artifact, fat
planes in the vastus lateralis are obliterated and the muscle
appears edematous relative to other thigh musculature. No focal
fluid collection is identified.

Soft tissues

There is stranding in subcutaneous fat and thickening of cutaneous
tissues about the lateral aspect of the thigh. No abscess is seen.
IMPRESSION: Findings consistent with cellulitis along the lateral aspect of the
right upper leg.

Edematous appearance of the vastus lateralis relative to other
musculature is worrisome for myositis. No abscess is seen but
evaluation is limited by streak artifact. Ultrasound could be used
for further evaluation if indicated.

Status post right hip replacement and fixation of a periprosthetic
fracture. The fracture is a partial union which may be an expected
finding as the date of fracture fixation is unknown.

## 2020-08-02 IMAGING — DX DG FEMUR 2+V*R*
5 series · 5 of 5 positions shown · non-contrast
Comparison: None.

CLINICAL DATA: History of right hip arthroplasty, subsequent
periprosthetic fracture, and incision site soft tissue infections.

EXAM:
RIGHT FEMUR 2 VIEWS

[femur ap (1 of 2)]
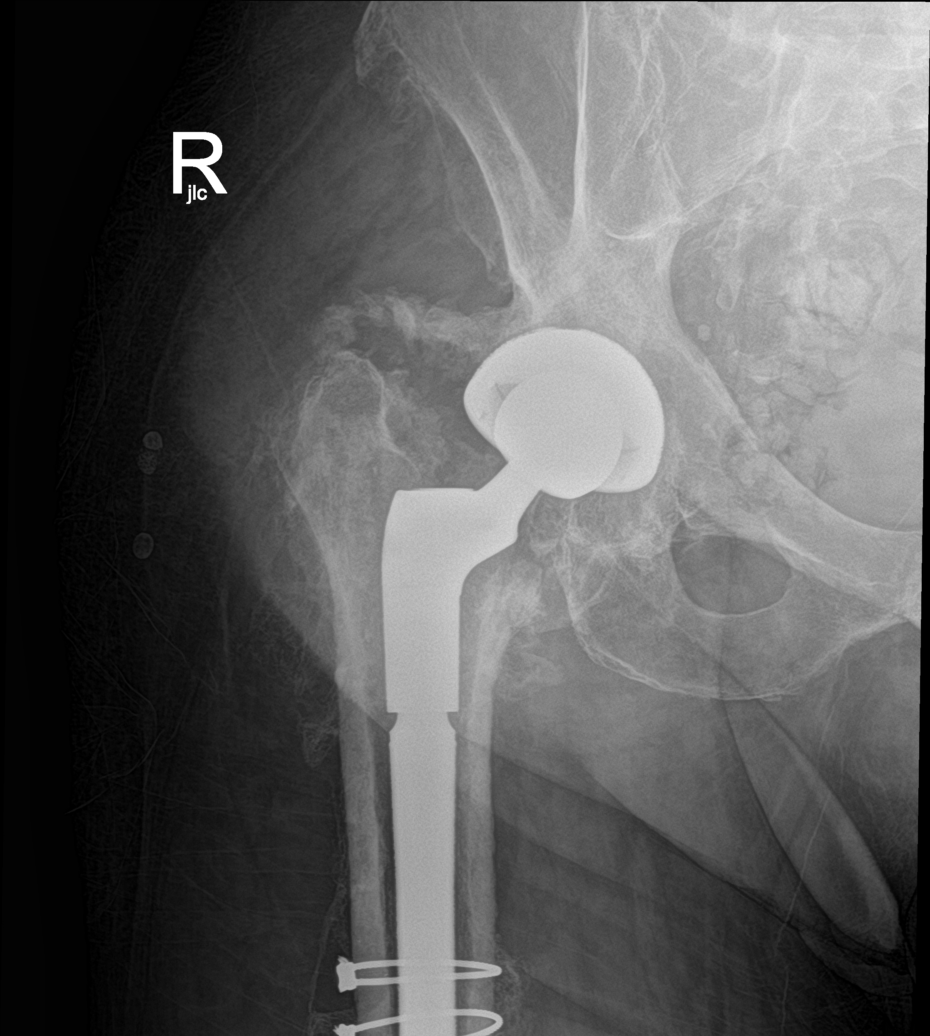

[femur ap (2 of 2)]
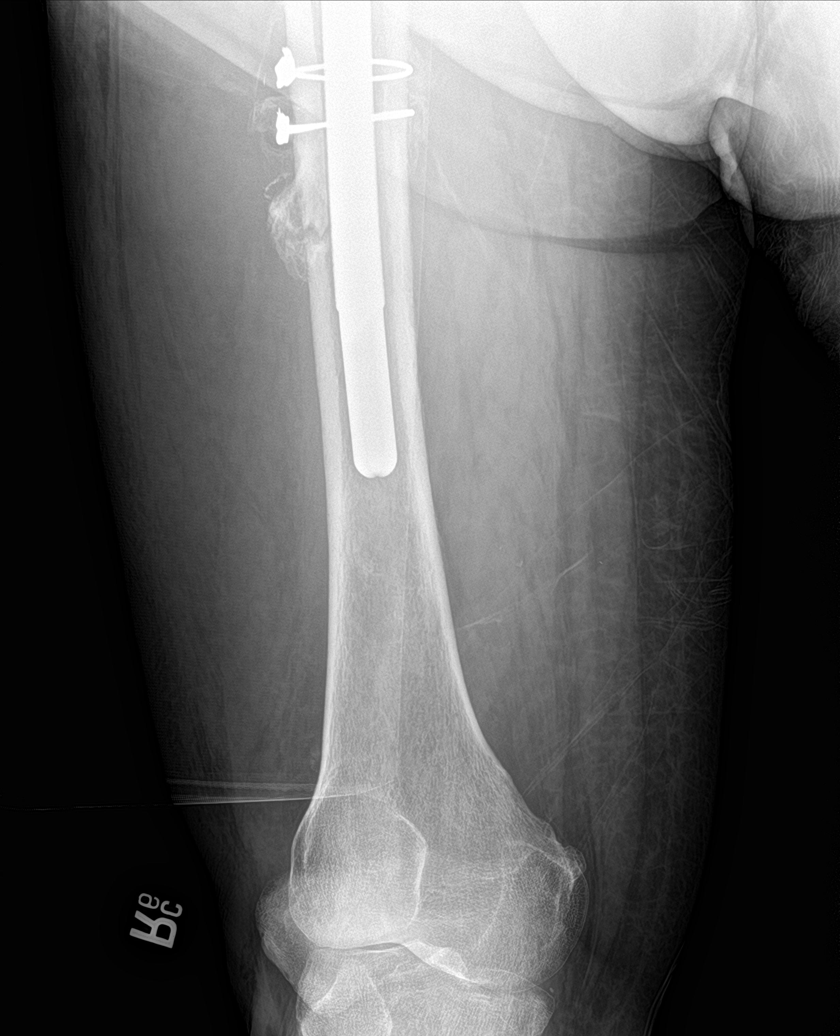

[femur lat (1 of 3)]
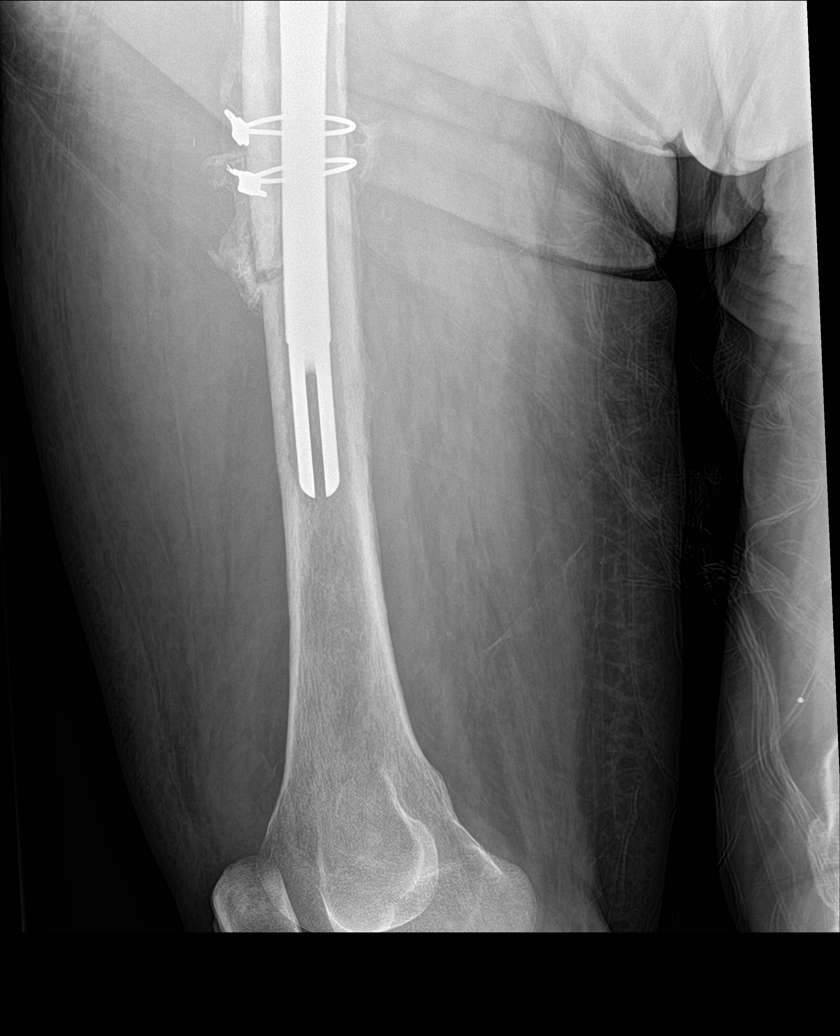

[femur lat (2 of 3)]
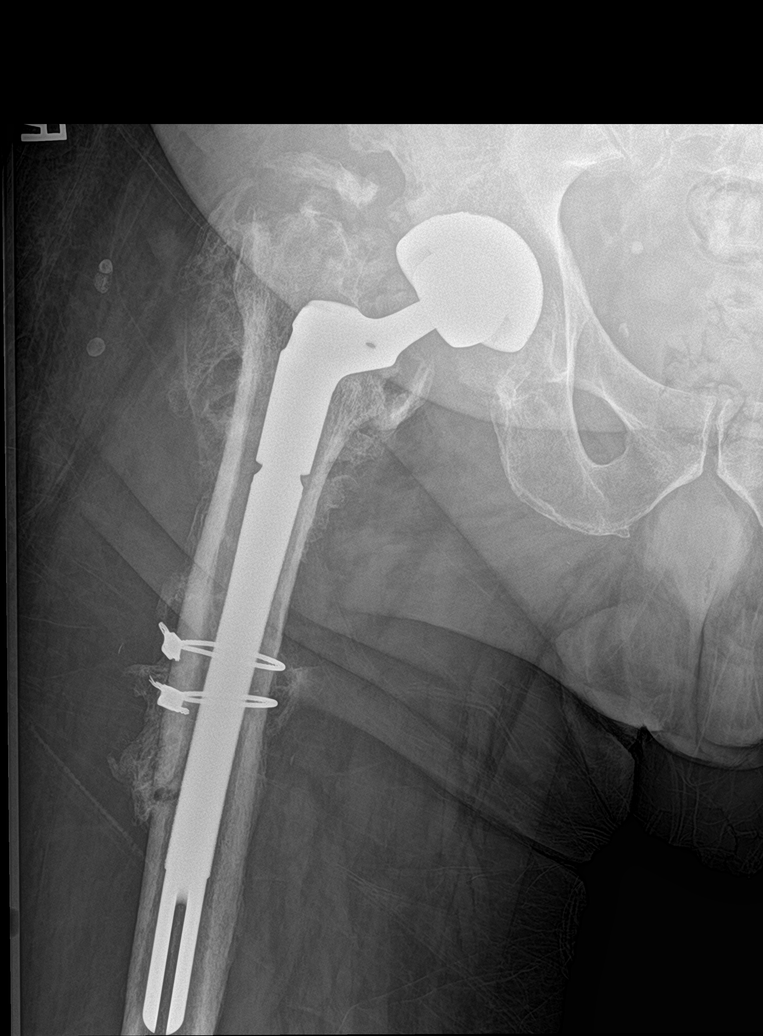

[femur lat (3 of 3)]
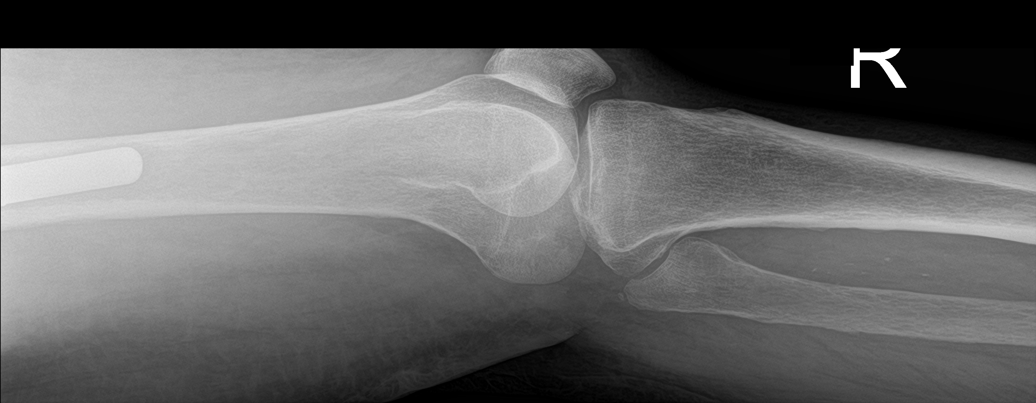

[5 of 5 positions shown; findings below may reference images not displayed]

FINDINGS: A right total hip arthroplasty is in place. Two wires are present
around the proximal to mid femoral shaft presumably related to
treatment of the reported periprosthetic fracture. Approximately
cm distal to the second wire is a 5 mm focus of cortical lucency
with adjacent periosteal new bone formation. No acute or displaced
fracture is identified. There is no dislocation. There is relatively
uniform lucency diffusely around the femoral stem without prior
studies available to assess for a change which could reflect
infection or loosening. Heterotopic ossification is noted about the
right hip. There is mild diffuse reticulation throughout the soft
tissues of the right thigh, nonspecific.
IMPRESSION: 1. Right total hip arthroplasty as above. Lack of available
comparison studies limits assessment for prosthetic loosening or
infection.
2. Focal cortical lucency in the femoral shaft distal to the
fixation wires with overlying periosteal new bone formation. This
may reflect a healing fracture given the history although infection
is not excluded.

## 2020-11-02 ENCOUNTER — Other Ambulatory Visit: Payer: Self-pay | Admitting: Cardiovascular Disease

## 2020-11-02 DIAGNOSIS — I484 Atypical atrial flutter: Secondary | ICD-10-CM

## 2020-11-02 NOTE — Telephone Encounter (Signed)
Prescription refill request for Eliquis received. Indication: A flutter Last office visit: Vincenza Hews 01/05/2020 Scr: 0.61, 05/30/2020 Age: 64 yo Weight: 124.7 kg   Prescription refill sent.

## 2020-11-03 ENCOUNTER — Other Ambulatory Visit: Payer: Self-pay

## 2020-11-03 MED ORDER — CARVEDILOL 12.5 MG PO TABS: 12.5000 mg | ORAL_TABLET | Freq: Two times a day (BID) | ORAL | 0 refills | Status: DC

## 2020-11-03 NOTE — Telephone Encounter (Signed)
Pt's medication was sent to pt's pharmacy as requested. Confirmation received.  °

## 2020-12-20 ENCOUNTER — Other Ambulatory Visit: Payer: Self-pay | Admitting: Family Medicine

## 2021-01-14 ENCOUNTER — Other Ambulatory Visit: Payer: Self-pay | Admitting: Family Medicine

## 2021-01-14 ENCOUNTER — Other Ambulatory Visit: Payer: Self-pay | Admitting: Cardiovascular Disease

## 2021-01-14 DIAGNOSIS — E782 Mixed hyperlipidemia: Secondary | ICD-10-CM

## 2021-02-03 ENCOUNTER — Other Ambulatory Visit: Payer: Self-pay | Admitting: Cardiovascular Disease

## 2021-02-03 ENCOUNTER — Other Ambulatory Visit: Payer: Self-pay | Admitting: Family Medicine

## 2021-02-03 DIAGNOSIS — E782 Mixed hyperlipidemia: Secondary | ICD-10-CM

## 2021-02-06 ENCOUNTER — Ambulatory Visit: Payer: BC Managed Care – PPO | Admitting: Cardiovascular Disease

## 2021-02-27 ENCOUNTER — Other Ambulatory Visit: Payer: Self-pay | Admitting: Family Medicine

## 2021-02-27 DIAGNOSIS — E782 Mixed hyperlipidemia: Secondary | ICD-10-CM

## 2021-03-02 ENCOUNTER — Other Ambulatory Visit: Payer: Self-pay | Admitting: Cardiovascular Disease

## 2021-03-08 ENCOUNTER — Encounter: Payer: Self-pay | Admitting: Physician Assistant

## 2021-03-08 ENCOUNTER — Ambulatory Visit: Payer: BC Managed Care – PPO | Admitting: Physician Assistant

## 2021-03-08 ENCOUNTER — Other Ambulatory Visit: Payer: Self-pay

## 2021-03-08 VITALS — BP 100/60 | HR 81 | Ht 76.0 in | Wt 294.0 lb

## 2021-03-08 DIAGNOSIS — E119 Type 2 diabetes mellitus without complications: Secondary | ICD-10-CM | POA: Diagnosis not present

## 2021-03-08 DIAGNOSIS — I25700 Atherosclerosis of coronary artery bypass graft(s), unspecified, with unstable angina pectoris: Secondary | ICD-10-CM

## 2021-03-08 DIAGNOSIS — I480 Paroxysmal atrial fibrillation: Secondary | ICD-10-CM

## 2021-03-08 DIAGNOSIS — E782 Mixed hyperlipidemia: Secondary | ICD-10-CM | POA: Diagnosis not present

## 2021-03-08 DIAGNOSIS — I10 Essential (primary) hypertension: Secondary | ICD-10-CM

## 2021-03-08 MED ORDER — EZETIMIBE 10 MG PO TABS: 10.0000 mg | ORAL_TABLET | Freq: Every day | ORAL | 3 refills | Status: DC
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
## 2021-03-08 NOTE — Patient Instructions (Signed)
Medication Instructions:  Your physician recommends that you continue on your current medications as directed. Please refer to the Current Medication list given to you today.  *If you need a refill on your cardiac medications before your next appointment, please call your pharmacy*   Lab Work: None ordered  If you have labs (blood work) drawn today and your tests are completely normal, you will receive your results only by: . MyChart Message (if you have MyChart) OR . A paper copy in the mail If you have any lab test that is abnormal or we need to change your treatment, we will call you to review the results.   Testing/Procedures: None ordered   Follow-Up: At [facility], you and your health needs are our priority.  As part of our continuing mission to provide you with exceptional heart care, we have created designated Provider Care Teams.  These Care Teams include your primary Cardiologist (physician) and Advanced Practice Providers (APPs -  Physician Assistants and Nurse Practitioners) who all work together to provide you with the care you need, when you need it.  We recommend signing up for the patient portal called "MyChart".  Sign up information is provided on this After Visit Summary.  MyChart is used to connect with patients for Virtual Visits (Telemedicine).  Patients are able to view lab/test results, encounter notes, upcoming appointments, etc.  Non-urgent messages can be sent to your provider as well.   To learn more about what you can do with MyChart, go to [email or web address].    Your next appointment:   12 month(s)  The format for your next appointment:   In Person  Provider:   You may see Philip Nahser, MD or one of the following Advanced Practice Providers on your designated Care Team:    Scott Weaver, PA-C  Vin Bhagat, PA-C    Other Instructions   

## 2021-03-15 ENCOUNTER — Other Ambulatory Visit: Payer: Self-pay | Admitting: Cardiovascular Disease

## 2021-03-17 ENCOUNTER — Other Ambulatory Visit: Payer: Self-pay | Admitting: Cardiovascular Disease

## 2021-03-17 DIAGNOSIS — I10 Essential (primary) hypertension: Secondary | ICD-10-CM

## 2021-04-04 ENCOUNTER — Other Ambulatory Visit: Payer: Self-pay | Admitting: Cardiovascular Disease

## 2021-05-18 ENCOUNTER — Other Ambulatory Visit: Payer: Self-pay | Admitting: Cardiovascular Disease

## 2021-05-18 DIAGNOSIS — I484 Atypical atrial flutter: Secondary | ICD-10-CM

## 2021-05-18 DIAGNOSIS — I480 Paroxysmal atrial fibrillation: Secondary | ICD-10-CM

## 2021-05-18 NOTE — Telephone Encounter (Signed)
Prescription refill request for Eliquis received. Indication: a flutter Last office visit: 03/08/21 Scr: 1.06 Age: 65 Weight: 133kg

## 2021-05-27 ENCOUNTER — Other Ambulatory Visit: Payer: Self-pay | Admitting: Cardiovascular Disease

## 2021-06-30 ENCOUNTER — Other Ambulatory Visit: Payer: Self-pay | Admitting: Family Medicine

## 2021-07-28 ENCOUNTER — Other Ambulatory Visit: Payer: Self-pay | Admitting: Family Medicine

## 2021-11-24 ENCOUNTER — Other Ambulatory Visit: Payer: Self-pay | Admitting: Cardiovascular Disease

## 2021-11-24 DIAGNOSIS — I484 Atypical atrial flutter: Secondary | ICD-10-CM

## 2021-11-24 DIAGNOSIS — I480 Paroxysmal atrial fibrillation: Secondary | ICD-10-CM

## 2021-11-24 NOTE — Telephone Encounter (Signed)
Eliquis 5mg  refill request received. Patient is 65 years old, weight-133.4kg, Crea-1.06 on 09/15/2021 via [facility] form 09/17/2021, [place], and last seen by Sparkman PA on 03/08/2021. Dose is appropriate based on dosing criteria. Will send in refill to requested pharmacy.

## 2022-02-28 ENCOUNTER — Other Ambulatory Visit: Payer: Self-pay | Admitting: Physician Assistant

## 2022-02-28 ENCOUNTER — Other Ambulatory Visit: Payer: Self-pay | Admitting: Cardiovascular Disease

## 2022-02-28 DIAGNOSIS — I10 Essential (primary) hypertension: Secondary | ICD-10-CM

## 2022-02-28 DIAGNOSIS — E782 Mixed hyperlipidemia: Secondary | ICD-10-CM

## 2022-03-23 ENCOUNTER — Other Ambulatory Visit: Payer: Self-pay | Admitting: Cardiovascular Disease

## 2022-05-21 ENCOUNTER — Ambulatory Visit: Payer: BC Managed Care – PPO | Admitting: Cardiovascular Disease

## 2022-05-26 ENCOUNTER — Other Ambulatory Visit: Payer: Self-pay | Admitting: Cardiovascular Disease

## 2022-05-26 DIAGNOSIS — I484 Atypical atrial flutter: Secondary | ICD-10-CM

## 2022-05-26 DIAGNOSIS — I480 Paroxysmal atrial fibrillation: Secondary | ICD-10-CM

## 2022-06-22 ENCOUNTER — Ambulatory Visit: Payer: BC Managed Care – PPO | Admitting: Cardiovascular Disease

## 2022-07-08 ENCOUNTER — Encounter: Payer: Self-pay | Admitting: Cardiovascular Disease

## 2022-07-08 NOTE — Progress Notes (Signed)
This encounter was created in error - please disregard.

## 2022-07-09 ENCOUNTER — Encounter: Payer: BC Managed Care – PPO | Admitting: Cardiovascular Disease

## 2022-07-09 ENCOUNTER — Other Ambulatory Visit: Payer: Self-pay | Admitting: *Deleted

## 2022-07-09 ENCOUNTER — Other Ambulatory Visit (HOSPITAL_COMMUNITY): Payer: Self-pay

## 2022-07-09 DIAGNOSIS — E782 Mixed hyperlipidemia: Secondary | ICD-10-CM

## 2022-07-09 MED ORDER — EZETIMIBE 10 MG PO TABS
10.0000 mg | ORAL_TABLET | Freq: Every day | ORAL | 0 refills | Status: DC
  Filled 2022-07-09: qty 30, 30d supply, fill #0

## 2022-07-11 ENCOUNTER — Other Ambulatory Visit (HOSPITAL_COMMUNITY): Payer: Self-pay

## 2022-07-23 ENCOUNTER — Other Ambulatory Visit (HOSPITAL_COMMUNITY): Payer: Self-pay

## 2022-07-24 ENCOUNTER — Ambulatory Visit: Payer: BC Managed Care – PPO | Admitting: Cardiovascular Disease

## 2022-07-24 ENCOUNTER — Encounter: Payer: Self-pay | Admitting: Cardiovascular Disease

## 2022-07-24 NOTE — Progress Notes (Deleted)
This encounter was created in error - please disregard.

## 2022-07-25 ENCOUNTER — Encounter: Payer: Medicare Other | Admitting: Cardiovascular Disease

## 2022-07-30 ENCOUNTER — Other Ambulatory Visit: Payer: Self-pay | Admitting: Cardiovascular Disease

## 2022-07-31 ENCOUNTER — Other Ambulatory Visit: Payer: Self-pay | Admitting: Cardiovascular Disease

## 2022-07-31 DIAGNOSIS — I10 Essential (primary) hypertension: Secondary | ICD-10-CM

## 2022-08-03 ENCOUNTER — Other Ambulatory Visit: Payer: Self-pay

## 2022-08-03 DIAGNOSIS — I10 Essential (primary) hypertension: Secondary | ICD-10-CM

## 2022-08-03 MED ORDER — CARVEDILOL 12.5 MG PO TABS: 12.5000 mg | ORAL_TABLET | Freq: Two times a day (BID) | ORAL | 0 refills | Status: DC

## 2022-08-03 MED ORDER — LOSARTAN POTASSIUM 50 MG PO TABS: 50.0000 mg | ORAL_TABLET | Freq: Every day | ORAL | 0 refills | Status: DC

## 2022-08-03 MED ORDER — AMLODIPINE BESYLATE 10 MG PO TABS: 10.0000 mg | ORAL_TABLET | Freq: Every day | ORAL | 0 refills | Status: DC

## 2022-08-03 NOTE — Telephone Encounter (Signed)
Pt's medications were sent to pt's pharmacy as requested. Confirmation received.  

## 2022-08-08 ENCOUNTER — Ambulatory Visit: Payer: Medicare Other | Attending: Cardiovascular Disease | Admitting: Cardiovascular Disease

## 2022-08-08 ENCOUNTER — Encounter: Payer: Self-pay | Admitting: Cardiovascular Disease

## 2022-08-08 VITALS — BP 118/66 | HR 81 | Ht 76.0 in

## 2022-08-08 DIAGNOSIS — I4821 Permanent atrial fibrillation: Secondary | ICD-10-CM | POA: Insufficient documentation

## 2022-08-08 DIAGNOSIS — I10 Essential (primary) hypertension: Secondary | ICD-10-CM | POA: Insufficient documentation

## 2022-08-08 DIAGNOSIS — I25119 Atherosclerotic heart disease of native coronary artery with unspecified angina pectoris: Secondary | ICD-10-CM | POA: Diagnosis present

## 2022-08-08 DIAGNOSIS — I4891 Unspecified atrial fibrillation: Secondary | ICD-10-CM | POA: Insufficient documentation
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
## 2022-08-08 NOTE — Patient Instructions (Addendum)
Check the price of the following drugs  Eliquis 5 mg twice a day Xarelto 20 mg once a day Pradaxa 150 mg twice a day Savaysa 60 mg a day  Medication Instructions:  Your physician recommends that you continue on your current medications as directed. Please refer to the Current Medication list given to you today.  *If you need a refill on your cardiac medications before your next appointment, please call your pharmacy*   Lab Work: NONE If you have labs (blood work) drawn today and your tests are completely normal, you will receive your results only by: MyChart Message (if you have MyChart) OR A paper copy in the mail If you have any lab test that is abnormal or we need to change your treatment, we will call you to review the results.   Testing/Procedures: NONE   Follow-Up: At [facility], you and your health needs are our priority.  As part of our continuing mission to provide you with exceptional heart care, we have created designated Provider Care Teams.  These Care Teams include your primary Cardiologist (physician) and Advanced Practice Providers (APPs -  Physician Assistants and Nurse Practitioners) who all work together to provide you with the care you need, when you need it.  We recommend signing up for the patient portal called "MyChart".  Sign up information is provided on this After Visit Summary.  MyChart is used to connect with patients for Virtual Visits (Telemedicine).  Patients are able to view lab/test results, encounter notes, upcoming appointments, etc.  Non-urgent messages can be sent to your provider as well.   To learn more about what you can do with MyChart, go to [email or web address].    Your next appointment:   1 year(s)  The format for your next appointment:   In Person  Provider:   Kristeen Miss, MD       Important Information About Sugar

## 2022-08-24 NOTE — Progress Notes (Signed)
Erroneous entry

## 2022-08-26 ENCOUNTER — Other Ambulatory Visit: Payer: Self-pay | Admitting: Cardiovascular Disease

## 2022-08-26 DIAGNOSIS — I10 Essential (primary) hypertension: Secondary | ICD-10-CM

## 2022-09-14 ENCOUNTER — Telehealth: Payer: Self-pay | Admitting: Cardiovascular Disease

## 2022-09-14 DIAGNOSIS — Z79899 Other long term (current) drug therapy: Secondary | ICD-10-CM

## 2022-09-14 NOTE — Telephone Encounter (Signed)
Called and spoke with patient to lethim know that Dr Acie Fredrickson is okay with switching to Pradaxa, but it's dosed based on creatinine clearance and we don't have updated labs for patient. Pt will come in Monday 09/17/22 for BMET, then we can call in pradaxa for him. Bmet ordered and scheduled at this time.

## 2022-09-14 NOTE — Telephone Encounter (Signed)
Pt c/o medication issue:  1. Name of Medication:  Pradaxa 150 MG  2. How are you currently taking this medication (dosage and times per day)?   3. Are you having a reaction (difficulty breathing--STAT)?   4. What is your medication issue?   Patient states he is interested in switching from Eliquis to Pradaxa due to it being less expensive.

## 2022-09-17 ENCOUNTER — Ambulatory Visit: Payer: Medicare Other | Attending: Cardiovascular Disease

## 2022-09-17 DIAGNOSIS — Z79899 Other long term (current) drug therapy: Secondary | ICD-10-CM

## 2022-09-18 LAB — BASIC METABOLIC PANEL
BUN/Creatinine Ratio: 32 — ABNORMAL HIGH (ref 10–24)
BUN: 24 mg/dL (ref 8–27)
CO2: 22 mmol/L (ref 20–29)
Calcium: 9.3 mg/dL (ref 8.6–10.2)
Chloride: 105 mmol/L (ref 96–106)
Creatinine, Ser: 0.76 mg/dL (ref 0.76–1.27)
Glucose: 105 mg/dL — ABNORMAL HIGH (ref 70–99)
Potassium: 4.9 mmol/L (ref 3.5–5.2)
Sodium: 139 mmol/L (ref 134–144)
eGFR: 100 mL/min/{1.73_m2} (ref >59)

## 2022-09-19 ENCOUNTER — Encounter: Payer: Self-pay | Admitting: Cardiovascular Disease

## 2022-09-20 MED ORDER — PRADAXA 150 MG PO PACK: 150.0000 mg | PACK | Freq: Two times a day (BID) | ORAL | 11 refills | Status: DC

## 2023-02-23 ENCOUNTER — Other Ambulatory Visit: Payer: Self-pay | Admitting: Cardiovascular Disease

## 2023-02-23 DIAGNOSIS — E782 Mixed hyperlipidemia: Secondary | ICD-10-CM

## 2023-08-23 ENCOUNTER — Other Ambulatory Visit: Payer: Self-pay | Admitting: Cardiovascular Disease

## 2023-08-23 DIAGNOSIS — E782 Mixed hyperlipidemia: Secondary | ICD-10-CM

## 2023-08-23 DIAGNOSIS — I10 Essential (primary) hypertension: Secondary | ICD-10-CM

## 2023-08-23 NOTE — Telephone Encounter (Signed)
Prescription refill request for Pradaxa received.  Indication:afib Last office visit:9/23 Weight:133.4  kg Age:67 Scr:0.76  10/23 CrCl:180.4  ml/min  Prescription refilled

## 2023-09-13 ENCOUNTER — Encounter: Payer: Self-pay | Admitting: Cardiovascular Disease

## 2023-09-13 MED ORDER — APIXABAN 5 MG PO TABS: 5.0000 mg | ORAL_TABLET | Freq: Two times a day (BID) | ORAL | 1 refills | Status: DC

## 2023-12-26 ENCOUNTER — Other Ambulatory Visit: Payer: Self-pay

## 2023-12-26 DIAGNOSIS — I10 Essential (primary) hypertension: Secondary | ICD-10-CM

## 2023-12-26 MED ORDER — LOSARTAN POTASSIUM 50 MG PO TABS
50.0000 mg | ORAL_TABLET | Freq: Every day | ORAL | 0 refills | Status: DC
Start: 2023-12-26 — End: 2024-05-29

## 2023-12-26 MED ORDER — AMLODIPINE BESYLATE 10 MG PO TABS: 10.0000 mg | ORAL_TABLET | Freq: Every day | ORAL | 0 refills | Status: DC

## 2023-12-26 MED ORDER — CARVEDILOL 12.5 MG PO TABS: 12.5000 mg | ORAL_TABLET | Freq: Two times a day (BID) | ORAL | 0 refills | Status: DC

## 2023-12-27 ENCOUNTER — Telehealth: Payer: Self-pay | Admitting: *Deleted

## 2023-12-27 DIAGNOSIS — I4821 Permanent atrial fibrillation: Secondary | ICD-10-CM

## 2023-12-27 NOTE — Telephone Encounter (Signed)
Pt requesting prescription for Ezetimibe 10mg  to be sent to [facility].

## 2023-12-27 NOTE — Telephone Encounter (Signed)
Prescription refill request for Eliquis received. Indication: afib  Last office visit: N[place], 08/08/2022 Scr: 0.76, 09/17/2022 Age: 68 yo  Weight: 133 kg    Pt overdue to see cardiologist and is overdue for labs. Sent message to schedulers to schedule office visit.    Pt would like prescription sent to [facility] pharmacy.

## 2023-12-30 ENCOUNTER — Telehealth: Payer: Self-pay | Admitting: Pharmacist

## 2023-12-30 MED ORDER — EZETIMIBE 10 MG PO TABS: 10.0000 mg | ORAL_TABLET | Freq: Every day | ORAL | 3 refills | Status: DC

## 2023-12-30 MED ORDER — APIXABAN 5 MG PO TABS: 5.0000 mg | ORAL_TABLET | Freq: Two times a day (BID) | ORAL | 0 refills | Status: DC

## 2023-12-30 NOTE — Telephone Encounter (Signed)
Called pt, no answer. Left message on voicemail.

## 2023-12-30 NOTE — Telephone Encounter (Signed)
Refill request received for Zetia from [employer].

## 2023-12-30 NOTE — Addendum Note (Signed)
Addended by: Betsy Coder B on: 12/30/2023 01:30 PM   Modules accepted: Orders

## 2024-01-25 ENCOUNTER — Other Ambulatory Visit: Payer: Self-pay | Admitting: Family Medicine

## 2024-01-25 DIAGNOSIS — N5089 Other specified disorders of the male genital organs: Secondary | ICD-10-CM

## 2024-01-29 ENCOUNTER — Ambulatory Visit
Admission: RE | Admit: 2024-01-29 | Discharge: 2024-01-29 | Discharge status: Home / Self Care | Payer: Medicare Other | Source: Ambulatory Visit | Attending: Family Medicine

## 2024-01-29 ENCOUNTER — Other Ambulatory Visit: Payer: Medicare Other

## 2024-01-29 DIAGNOSIS — N5089 Other specified disorders of the male genital organs: Secondary | ICD-10-CM

## 2024-02-17 ENCOUNTER — Encounter: Payer: Self-pay | Admitting: Cardiovascular Disease
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
## 2024-02-18 ENCOUNTER — Encounter: Payer: Self-pay | Admitting: Cardiovascular Disease

## 2024-02-18 ENCOUNTER — Ambulatory Visit: Payer: Medicare Other | Attending: Cardiovascular Disease | Admitting: Cardiovascular Disease

## 2024-02-18 VITALS — BP 98/60 | HR 67 | Ht 76.0 in | Wt 300.0 lb

## 2024-02-18 DIAGNOSIS — I10 Essential (primary) hypertension: Secondary | ICD-10-CM | POA: Diagnosis not present

## 2024-02-18 DIAGNOSIS — R600 Localized edema: Secondary | ICD-10-CM | POA: Diagnosis present

## 2024-02-18 DIAGNOSIS — M7989 Other specified soft tissue disorders: Secondary | ICD-10-CM | POA: Diagnosis not present

## 2024-02-18 DIAGNOSIS — I25119 Atherosclerotic heart disease of native coronary artery with unspecified angina pectoris: Secondary | ICD-10-CM | POA: Diagnosis not present

## 2024-02-18 DIAGNOSIS — I4821 Permanent atrial fibrillation: Secondary | ICD-10-CM | POA: Diagnosis not present

## 2024-02-18 NOTE — Patient Instructions (Signed)
 Medication Instructions:  Your physician recommends that you continue on your current medications as directed. Please refer to the Current Medication list given to you today.  *If you need a refill on your cardiac medications before your next appointment, please call your pharmacy*  Testing/Procedures: Your physician has requested that you have an echocardiogram. Echocardiography is a painless test that uses sound waves to create images of your heart. It provides your doctor with information about the size and shape of your heart and how well your heart's chambers and valves are working. This procedure takes approximately one hour. There are no restrictions for this procedure. Please do NOT wear cologne, perfume, aftershave, or lotions (deodorant is allowed). Please arrive 15 minutes prior to your appointment time.  Please note: We ask at that you not bring children with you during ultrasound (echo/ vascular) testing. Due to room size and safety concerns, children are not allowed in the ultrasound rooms during exams. Our front office staff cannot provide observation of children in our lobby area while testing is being conducted. An adult accompanying a patient to their appointment will only be allowed in the ultrasound room at the discretion of the ultrasound technician under special circumstances. We apologize for any inconvenience.  Follow-Up: At [facility], you and your health needs are our priority.  As part of our continuing mission to provide you with exceptional heart care, we have created designated Provider Care Teams.  These Care Teams include your primary Cardiologist (physician) and Advanced Practice Providers (APPs -  Physician Assistants and Nurse Practitioners) who all work together to provide you with the care you need, when you need it.  Your next appointment:   1 year(s)  The format for your next appointment:   In Person  Provider:   Kristeen Miss, MD {  Other  Instructions    1st Floor: - Lobby - Registration  - Pharmacy  - Lab - Cafe  2nd Floor: - PV Lab - Diagnostic Testing (echo, CT, nuclear med)  3rd Floor: - Vacant  4th Floor: - TCTS (cardiothoracic surgery) - AFib Clinic - Structural Heart Clinic - Vascular Surgery  - Vascular Ultrasound  5th Floor: - HeartCare Cardiology (general and EP) - Clinical Pharmacy for coumadin, hypertension, lipid, weight-loss medications, and med management appointments    Valet parking services will be available as well.

## 2024-03-17 ENCOUNTER — Encounter: Payer: Self-pay | Admitting: Cardiovascular Disease

## 2024-03-17 ENCOUNTER — Ambulatory Visit (HOSPITAL_COMMUNITY): Attending: Cardiology

## 2024-03-17 DIAGNOSIS — R600 Localized edema: Secondary | ICD-10-CM | POA: Diagnosis present

## 2024-03-17 DIAGNOSIS — I272 Pulmonary hypertension, unspecified: Secondary | ICD-10-CM

## 2024-03-17 DIAGNOSIS — M7989 Other specified soft tissue disorders: Secondary | ICD-10-CM | POA: Diagnosis present

## 2024-03-17 LAB — ECHOCARDIOGRAM COMPLETE
Area-P 1/2: 3.85 cm2
S' Lateral: 3.8 cm

## 2024-03-19 NOTE — Telephone Encounter (Addendum)
-----   Message from Ahmad Alert sent at 03/17/2024  5:36 PM EDT ----- Normal left ventricular systolic function with EF of 55 to 60%.  There is severe asymmetric LVH involving the septum.  We were unable to evaluate his diastolic function. There is flattening of the interventricular septum both in systole and diastole consistent with RV pressure and volume overload. There is akinesis of the LV inferior basilar wall. RV systolic function is moderately reduced.  The RV is moderately enlarged.  There is severely elevated pulmonary artery pressure with an estimated PA pressure of 74.9 mmHg. Severe LAE, severe RAE Mild mitral regurgitation  Please refer to the Advanced CHF clinic for consultation for his severe pulmonary HTN.  He would like to follow up with Dr. Renna Cary for general cardiology after my retirement  ___________________________________________________  Liana Reding and spoke with the patient.   He and Dr. Alroy Aspen spoke about this.  He has no questions at this time.  Order placed for CHF clinic.  Pt aware he will receive a call to schedule.

## 2024-03-24 LAB — LAB REPORT - SCANNED: EGFR: 109

## 2024-03-27 ENCOUNTER — Other Ambulatory Visit: Payer: Self-pay | Admitting: Cardiovascular Disease

## 2024-03-27 DIAGNOSIS — I4821 Permanent atrial fibrillation: Secondary | ICD-10-CM

## 2024-03-27 NOTE — Telephone Encounter (Signed)
 Eliquis  5mg  refill request received. Patient is 68 years old, weight-136.1kg, Crea-0.55 on 4/ 22/25 via scanned labs from PCP, Diagnosis-aflutter, and last seen by Dr. Alroy Aspen on 02/18/24. Dose is appropriate based on dosing criteria. Will send in refill to requested pharmacy.

## 2024-05-19 NOTE — Progress Notes (Signed)
   ADVANCED HEART FAILURE CLINIC NOTE  Referring Physician: Ransom Byers, *  Primary Care: Ransom Byers, MD Primary Cardiologist: Heart Failure: Alwin Baars, DO  CC: Pulmonary hypertension  HPI: Steve [name] is a 69 y.o. male with atrial flutter, CAD s/p CABG in 2011, HTN, T2DM, chronic osteomyelitis of the right hip and elevated PA pressures by echocardiogram presenting today for evaluation of pulmonary hypertension & RV failure.   Pertinent Family & social hx: see below.   Cardiac / Pertinent History:  - CABG in 2011 - Hip replacement in 2019 complicated by femur fracture 1 week later requiring repeat surgery.   - Removal in 2021 due to osteomyelitis.  - Chronic antibiotics due to recurrent right hip osteomyelitis.    Interval hx:  - [name] presents today for his initial evaluation for pulmonary hypertension.  From a functional standpoint he is unfortunately bound to his wheelchair due to recurrent/chronic right hip osteomyelitis.  He is being followed by orthopedic surgery and infectious disease at this time.  Echocardiograms have demonstrated progressively worsening RV function and a drastic rise in PA systolic pressure.  He has become slowly volume overloaded with 2-3+ pitting edema in the left lower extremity and 3+ to the thigh in the right lower extremity.  PHYSICAL EXAM: Vitals:   05/20/24 1058  BP: (!) 100/58  Pulse: 72  SpO2: 94%   Lungs- CTA CARDIAC:  JVP: 7 cm          Normal rate with regular rhythm. no murmur.  Pulses 2+. +2 on RLE, +3 LLE edema.  ABDOMEN: soft EXTREMITIES: Warm and well perfused.   DATA REVIEW  ECG: 02/18/24: atrial fibrillation  As per my personal interpretation  ECHO: 03/17/24: LVEF 55%, mod-severely reduced RV function; PASP .   CATH: Schedule RHC    ASSESSMENT & PLAN:  Evaluation for pulmonary hypertension - TTE from 03/17/24 personally reviewed. Septal flattening, dilated RV with reduced  function and RVSP of consistent with pulmonary hypertension  - He has had multiple hip surgeries? I am concerned about CTEPH. Will plan on V/Q scan.  - ReDs reading: 33 %, normal - Lasix  40mg  PRN currently; will increase to daily for 3-5 days.  - Given his history of multiple hip fracture / wheel chair bound status he is at higher risk of DVTs. V/Q scan & B/L lower extremity DVT dopplers.  - Schedule RHC.    2. CAD s/p CABG - no chest pain  3. Persistent  atrial fibrllation  - EKG today with atrial fibrillation - continue apixban 5mg  BID  4. Hypertension - Off amlodipine  now.  - He has not taken clonidine  for several years now - Coreg  12.5mg  BID.  - Losartan  50mg  daily  - BMP/BNP today.  5. Hip fracture / osteomyelitis  - Followed by ID; planning on needle biopsy to evaluate which bacteria is leading to recurrent osteomyelitis. Most recently had a MRI consistent with infection of the right hip. He is wheelchair bound due to chronic right hip osteomyelits.   Tomorrow [facility] Advanced Heart Failure Mechanical Circulatory Support

## 2024-05-19 NOTE — H&P (View-Only) (Signed)
   ADVANCED HEART FAILURE CLINIC NOTE  Referring Physician: Ransom Byers, *  Primary Care: Ransom Byers, MD Primary Cardiologist: Heart Failure: Alwin Baars, DO  CC: Pulmonary hypertension  HPI: Steve [name] is a 69 y.o. male with atrial flutter, CAD s/p CABG in 2011, HTN, T2DM, chronic osteomyelitis of the right hip and elevated PA pressures by echocardiogram presenting today for evaluation of pulmonary hypertension & RV failure.   Pertinent Family & social hx: see below.   Cardiac / Pertinent History:  - CABG in 2011 - Hip replacement in 2019 complicated by femur fracture 1 week later requiring repeat surgery.   - Removal in 2021 due to osteomyelitis.  - Chronic antibiotics due to recurrent right hip osteomyelitis.    Interval hx:  - [name] presents today for his initial evaluation for pulmonary hypertension.  From a functional standpoint he is unfortunately bound to his wheelchair due to recurrent/chronic right hip osteomyelitis.  He is being followed by orthopedic surgery and infectious disease at this time.  Echocardiograms have demonstrated progressively worsening RV function and a drastic rise in PA systolic pressure.  He has become slowly volume overloaded with 2-3+ pitting edema in the left lower extremity and 3+ to the thigh in the right lower extremity.  PHYSICAL EXAM: Vitals:   05/20/24 1058  BP: (!) 100/58  Pulse: 72  SpO2: 94%   Lungs- CTA CARDIAC:  JVP: 7 cm          Normal rate with regular rhythm. no murmur.  Pulses 2+. +2 on RLE, +3 LLE edema.  ABDOMEN: soft EXTREMITIES: Warm and well perfused.   DATA REVIEW  ECG: 02/18/24: atrial fibrillation  As per my personal interpretation  ECHO: 03/17/24: LVEF 55%, mod-severely reduced RV function; PASP .   CATH: Schedule RHC    ASSESSMENT & PLAN:  Evaluation for pulmonary hypertension - TTE from 03/17/24 personally reviewed. Septal flattening, dilated RV with reduced  function and RVSP of consistent with pulmonary hypertension  - He has had multiple hip surgeries? I am concerned about CTEPH. Will plan on V/Q scan.  - ReDs reading: 33 %, normal - Lasix  40mg  PRN currently; will increase to daily for 3-5 days.  - Given his history of multiple hip fracture / wheel chair bound status he is at higher risk of DVTs. V/Q scan & B/L lower extremity DVT dopplers.  - Schedule RHC.    2. CAD s/p CABG - no chest pain  3. Persistent  atrial fibrllation  - EKG today with atrial fibrillation - continue apixban 5mg  BID  4. Hypertension - Off amlodipine  now.  - He has not taken clonidine  for several years now - Coreg  12.5mg  BID.  - Losartan  50mg  daily  - BMP/BNP today.  5. Hip fracture / osteomyelitis  - Followed by ID; planning on needle biopsy to evaluate which bacteria is leading to recurrent osteomyelitis. Most recently had a MRI consistent with infection of the right hip. He is wheelchair bound due to chronic right hip osteomyelits.   Tomorrow [facility] Advanced Heart Failure Mechanical Circulatory Support

## 2024-05-20 ENCOUNTER — Ambulatory Visit (HOSPITAL_COMMUNITY)
Admission: RE | Admit: 2024-05-20 | Discharge: 2024-05-20 | Disposition: A | Discharge status: Home / Self Care | Source: Ambulatory Visit | Attending: Cardiology | Admitting: Cardiology

## 2024-05-20 ENCOUNTER — Other Ambulatory Visit (HOSPITAL_COMMUNITY): Payer: Self-pay | Admitting: *Deleted

## 2024-05-20 VITALS — BP 100/58 | HR 72 | Wt 271.4 lb

## 2024-05-20 DIAGNOSIS — Z79899 Other long term (current) drug therapy: Secondary | ICD-10-CM | POA: Diagnosis not present

## 2024-05-20 DIAGNOSIS — R601 Generalized edema: Secondary | ICD-10-CM | POA: Diagnosis not present

## 2024-05-20 DIAGNOSIS — I272 Pulmonary hypertension, unspecified: Secondary | ICD-10-CM | POA: Diagnosis present

## 2024-05-20 DIAGNOSIS — Z951 Presence of aortocoronary bypass graft: Secondary | ICD-10-CM | POA: Insufficient documentation

## 2024-05-20 DIAGNOSIS — I4819 Other persistent atrial fibrillation: Secondary | ICD-10-CM | POA: Insufficient documentation

## 2024-05-20 DIAGNOSIS — I110 Hypertensive heart disease with heart failure: Secondary | ICD-10-CM | POA: Diagnosis not present

## 2024-05-20 DIAGNOSIS — I509 Heart failure, unspecified: Secondary | ICD-10-CM | POA: Diagnosis not present

## 2024-05-20 DIAGNOSIS — I4892 Unspecified atrial flutter: Secondary | ICD-10-CM | POA: Insufficient documentation

## 2024-05-20 DIAGNOSIS — M7989 Other specified soft tissue disorders: Secondary | ICD-10-CM

## 2024-05-20 DIAGNOSIS — I4821 Permanent atrial fibrillation: Secondary | ICD-10-CM

## 2024-05-20 DIAGNOSIS — I4891 Unspecified atrial fibrillation: Secondary | ICD-10-CM

## 2024-05-20 DIAGNOSIS — M869 Osteomyelitis, unspecified: Secondary | ICD-10-CM | POA: Insufficient documentation

## 2024-05-20 DIAGNOSIS — I50810 Right heart failure, unspecified: Secondary | ICD-10-CM

## 2024-05-20 DIAGNOSIS — Z993 Dependence on wheelchair: Secondary | ICD-10-CM | POA: Diagnosis not present

## 2024-05-20 DIAGNOSIS — I2510 Atherosclerotic heart disease of native coronary artery without angina pectoris: Secondary | ICD-10-CM | POA: Insufficient documentation

## 2024-05-20 DIAGNOSIS — E1169 Type 2 diabetes mellitus with other specified complication: Secondary | ICD-10-CM | POA: Insufficient documentation

## 2024-05-20 DIAGNOSIS — M8660 Other chronic osteomyelitis, unspecified site: Secondary | ICD-10-CM

## 2024-05-20 DIAGNOSIS — I10 Essential (primary) hypertension: Secondary | ICD-10-CM

## 2024-05-20 LAB — COMPREHENSIVE METABOLIC PANEL WITH GFR
ALT: 32 U/L (ref 0–44)
AST: 31 U/L (ref 15–41)
Albumin: 2.8 g/dL — ABNORMAL LOW (ref 3.5–5.0)
Alkaline Phosphatase: 69 U/L (ref 38–126)
Anion gap: 8 (ref 5–15)
BUN: 21 mg/dL (ref 8–23)
CO2: 21 mmol/L — ABNORMAL LOW (ref 22–32)
Calcium: 8.5 mg/dL — ABNORMAL LOW (ref 8.9–10.3)
Chloride: 107 mmol/L (ref 98–111)
Creatinine, Ser: 0.75 mg/dL (ref 0.61–1.24)
GFR, Estimated: >60 mL/min (ref >60)
Glucose, Bld: 123 mg/dL — ABNORMAL HIGH (ref 70–99)
Potassium: 4.3 mmol/L (ref 3.5–5.1)
Sodium: 136 mmol/L (ref 135–145)
Total Bilirubin: 1.4 mg/dL — ABNORMAL HIGH (ref 0.0–1.2)
Total Protein: 6.1 g/dL — ABNORMAL LOW (ref 6.5–8.1)

## 2024-05-20 LAB — CBC
HCT: 31.9 % — ABNORMAL LOW (ref 39.0–52.0)
Hemoglobin: 10.7 g/dL — ABNORMAL LOW (ref 13.0–17.0)
MCH: 35.5 pg — ABNORMAL HIGH (ref 26.0–34.0)
MCHC: 33.5 g/dL (ref 30.0–36.0)
MCV: 106 fL — ABNORMAL HIGH (ref 80.0–100.0)
Platelets: 374 10*3/uL (ref 150–400)
RBC: 3.01 MIL/uL — ABNORMAL LOW (ref 4.22–5.81)
RDW: 21.6 % — ABNORMAL HIGH (ref 11.5–15.5)
WBC: 5.6 10*3/uL (ref 4.0–10.5)
nRBC: 0.4 % — ABNORMAL HIGH (ref 0.0–0.2)

## 2024-05-20 LAB — BRAIN NATRIURETIC PEPTIDE: B Natriuretic Peptide: 341.6 pg/mL — ABNORMAL HIGH (ref 0.0–100.0)

## 2024-05-20 MED ORDER — FUROSEMIDE 20 MG PO TABS: 20.0000 mg | ORAL_TABLET | Freq: Every day | ORAL | 1 refills | Status: DC

## 2024-05-20 NOTE — Patient Instructions (Signed)
 Medication Changes:  Take Furosemide  40 mg (2 tabs) Daily FOR 3 DAYS ONLY, then 20 mg (1 tab) Daily  Lab Work:  Labs done today, your results will be available in MyChart, we will contact you for abnormal readings.   Testing/Procedures:  Heart Catheterization on Tue 7/1/125, see instructions below  Your physician has requested that you have a lower extremity venous duplex. This test is an ultrasound of the veins in the legs or arms. It looks at venous blood flow that carries blood from the heart to the legs or arms. Allow one hour for a Lower Venous exam. Allow thirty minutes for an Upper Venous exam. There are no restrictions or special instructions. YOU WILL BE CALLED TO SCHEDULE THIS  Please note: We ask at that you not bring children with you during ultrasound (echo/ vascular) testing. Due to room size and safety concerns, children are not allowed in the ultrasound rooms during exams. Our front office staff cannot provide observation of children in our lobby area while testing is being conducted. An adult accompanying a patient to their appointment will only be allowed in the ultrasound room at the discretion of the ultrasound technician under special circumstances. We apologize for any inconvenience.   Special Instructions // Education:  Do the following things EVERYDAY: Weigh yourself in the morning before breakfast. Write it down and keep it in a log. Take your medicines as prescribed Eat low salt foods--Limit salt (sodium) to 2000 mg per day.  Stay as active as you can everyday Limit all fluids for the day to less than 2 liters    CATHETERIZATION INSTRUCTIONS:  You are scheduled for a Cardiac Catheterization on Tuesday, July 1 with Dr. Alwin Baars.  1. Please arrive at the [facility] (Main Entrance A) at [facility]: [address] [place], [place] [address] at 7:00 AM (This time is 2 hour(s) before your procedure to ensure your preparation).   Free valet  parking service is available. You will check in at ADMITTING. The support person will be asked to wait in the waiting room.  It is OK to have someone drop you off and come back when you are ready to be discharged.    Special note: Every effort is made to have your procedure done on time. Please understand that emergencies sometimes delay scheduled procedures.  2. Diet: Do not eat solid foods after midnight.  The patient may have clear liquids until 5am upon the day of the procedure.  3. Labs: DONE TODAY  4. Medication instructions in preparation for your procedure:   TUESDAY 06/02/24 AM DO NOT TAKE: Glipizide , Jardiance , Janumet , Furosemide , or Eliquis   On the morning of your procedure, take any morning medicines NOT listed above.  You may use sips of water.  5. Plan to go home the same day, you will only stay overnight if medically necessary. 6. Bring a current list of your medications and current insurance cards. 7. You MUST have a responsible person to drive you home. 8. Someone MUST be with you the first 24 hours after you arrive home or your discharge will be delayed. 9. Please wear clothes that are easy to get on and off and wear slip-on shoes.  Thank you for allowing us  to care for you!   -- [facility]    Follow-Up in: 1-2 weeks after heart cath   At the [facility], you and your health needs are our priority. We have a designated  team specialized in the treatment of Heart Failure. This Care Team includes your primary Heart Failure Specialized Cardiologist (physician), Advanced Practice Providers (APPs- Physician Assistants and Nurse Practitioners), and Pharmacist who all work together to provide you with the care you need, when you need it.   You may see any of the following providers on your designated Care Team at your next follow up:  Dr. Jules Oar Dr. Peder Bourdon Dr. Alwin Baars Dr. Judyth Nunnery Nieves Bars,  NP Ruddy Corral, [place] [facility] [place], [place] Dennise Fitz, NP [place] Lee, NP Luster Salters, PharmD   Please be sure to bring in all your medications bottles to every appointment.   Need to Contact Us :  If you have any questions or concerns before your next appointment please send us  a message through [place] or call our office at [phone].    TO LEAVE A MESSAGE FOR THE NURSE SELECT OPTION 2, PLEASE LEAVE A MESSAGE INCLUDING: YOUR NAME DATE OF BIRTH CALL BACK NUMBER REASON FOR CALL**this is important as we prioritize the call backs  YOU WILL RECEIVE A CALL BACK THE SAME DAY AS LONG AS YOU CALL BEFORE 4:00 PM

## 2024-05-28 ENCOUNTER — Ambulatory Visit (HOSPITAL_COMMUNITY)
Admission: RE | Admit: 2024-05-28 | Discharge: 2024-05-28 | Disposition: A | Discharge status: Home / Self Care | Source: Ambulatory Visit | Attending: Cardiology | Admitting: Cardiology

## 2024-05-28 DIAGNOSIS — R601 Generalized edema: Secondary | ICD-10-CM | POA: Diagnosis present

## 2024-05-28 DIAGNOSIS — M7989 Other specified soft tissue disorders: Secondary | ICD-10-CM | POA: Diagnosis not present

## 2024-06-02 ENCOUNTER — Encounter (HOSPITAL_COMMUNITY): Payer: Self-pay | Admitting: Cardiology

## 2024-06-02 ENCOUNTER — Other Ambulatory Visit: Payer: Self-pay

## 2024-06-02 ENCOUNTER — Encounter (HOSPITAL_COMMUNITY)
Admission: RE | Disposition: A | Discharge status: Home / Self Care | Payer: Self-pay | Source: Ambulatory Visit | Attending: Cardiology

## 2024-06-02 ENCOUNTER — Ambulatory Visit (HOSPITAL_COMMUNITY)
Admission: RE | Admit: 2024-06-02 | Discharge: 2024-06-02 | Disposition: A | Discharge status: Home / Self Care | Source: Ambulatory Visit | Attending: Cardiology | Admitting: Cardiology

## 2024-06-02 DIAGNOSIS — M868X8 Other osteomyelitis, other site: Secondary | ICD-10-CM | POA: Insufficient documentation

## 2024-06-02 DIAGNOSIS — I10 Essential (primary) hypertension: Secondary | ICD-10-CM | POA: Diagnosis not present

## 2024-06-02 DIAGNOSIS — I4892 Unspecified atrial flutter: Secondary | ICD-10-CM | POA: Diagnosis not present

## 2024-06-02 DIAGNOSIS — I272 Pulmonary hypertension, unspecified: Secondary | ICD-10-CM

## 2024-06-02 DIAGNOSIS — Z7901 Long term (current) use of anticoagulants: Secondary | ICD-10-CM | POA: Insufficient documentation

## 2024-06-02 DIAGNOSIS — I50810 Right heart failure, unspecified: Secondary | ICD-10-CM

## 2024-06-02 DIAGNOSIS — I4819 Other persistent atrial fibrillation: Secondary | ICD-10-CM | POA: Diagnosis not present

## 2024-06-02 DIAGNOSIS — E119 Type 2 diabetes mellitus without complications: Secondary | ICD-10-CM | POA: Insufficient documentation

## 2024-06-02 DIAGNOSIS — Z993 Dependence on wheelchair: Secondary | ICD-10-CM | POA: Insufficient documentation

## 2024-06-02 DIAGNOSIS — I2510 Atherosclerotic heart disease of native coronary artery without angina pectoris: Secondary | ICD-10-CM | POA: Insufficient documentation

## 2024-06-02 DIAGNOSIS — Z79899 Other long term (current) drug therapy: Secondary | ICD-10-CM | POA: Insufficient documentation

## 2024-06-02 DIAGNOSIS — Z951 Presence of aortocoronary bypass graft: Secondary | ICD-10-CM | POA: Diagnosis not present

## 2024-06-02 HISTORY — PX: RIGHT HEART CATH: CATH118263

## 2024-06-02 LAB — POCT I-STAT EG7
Acid-Base Excess: 0 mmol/L (ref 0.0–2.0)
Acid-Base Excess: 0 mmol/L (ref 0.0–2.0)
Bicarbonate: 24.7 mmol/L (ref 20.0–28.0)
Bicarbonate: 24.9 mmol/L (ref 20.0–28.0)
Calcium, Ion: 1.2 mmol/L (ref 1.15–1.40)
Calcium, Ion: 1.2 mmol/L (ref 1.15–1.40)
HCT: 29 % — ABNORMAL LOW (ref 39.0–52.0)
HCT: 30 % — ABNORMAL LOW (ref 39.0–52.0)
Hemoglobin: 10.2 g/dL — ABNORMAL LOW (ref 13.0–17.0)
Hemoglobin: 9.9 g/dL — ABNORMAL LOW (ref 13.0–17.0)
O2 Saturation: 63 %
O2 Saturation: 66 %
Potassium: 3.8 mmol/L (ref 3.5–5.1)
Potassium: 3.8 mmol/L (ref 3.5–5.1)
Sodium: 136 mmol/L (ref 135–145)
Sodium: 137 mmol/L (ref 135–145)
TCO2: 26 mmol/L (ref 22–32)
TCO2: 26 mmol/L (ref 22–32)
pCO2, Ven: 41.6 mmHg — ABNORMAL LOW (ref 44–60)
pCO2, Ven: 42.1 mmHg — ABNORMAL LOW (ref 44–60)
pH, Ven: 7.377 (ref 7.25–7.43)
pH, Ven: 7.385 (ref 7.25–7.43)
pO2, Ven: 34 mmHg (ref 32–45)
pO2, Ven: 35 mmHg (ref 32–45)

## 2024-06-02 LAB — GLUCOSE, CAPILLARY: Glucose-Capillary: 102 mg/dL — ABNORMAL HIGH (ref 70–99)

## 2024-06-02 SURGERY — RIGHT HEART CATH
Anesthesia: LOCAL

## 2024-06-02 MED ORDER — SODIUM CHLORIDE 0.9 % IV SOLN: INTRAVENOUS | Status: DC

## 2024-06-02 MED ORDER — LIDOCAINE HCL (PF) 1 % IJ SOLN
INTRAMUSCULAR | Status: AC
  Filled 2024-06-02: qty 30

## 2024-06-02 MED ORDER — LIDOCAINE HCL (PF) 1 % IJ SOLN
INTRAMUSCULAR | Status: DC | PRN
  Administered 2024-06-02: 2 mL

## 2024-06-02 MED ORDER — HEPARIN (PORCINE) IN NACL 1000-0.9 UT/500ML-% IV SOLN
INTRAVENOUS | Status: DC | PRN
  Administered 2024-06-02: 500 mL

## 2024-06-02 SURGICAL SUPPLY — 5 items
CATH SWAN GANZ 7F STRAIGHT (CATHETERS) IMPLANT
GLIDESHEATH SLENDER 7FR .021G (SHEATH) IMPLANT
PACK CARDIAC CATHETERIZATION (CUSTOM PROCEDURE TRAY) ×1 IMPLANT
TRANSDUCER W/STOPCOCK (MISCELLANEOUS) IMPLANT
TUBING ART PRESS 72 MALE/FEM (TUBING) IMPLANT

## 2024-06-02 NOTE — Discharge Instructions (Signed)

## 2024-06-02 NOTE — Interval H&P Note (Signed)
 History and Physical Interval Note:  06/02/2024 7:36 AM  Steve [name]  has presented today for surgery, with the diagnosis of hp - rv failure.  The various methods of treatment have been discussed with the patient and family. After consideration of risks, benefits and other options for treatment, the patient has consented to  Procedure(s): RIGHT HEART CATH (N/A) as a surgical intervention.  The patient's history has been reviewed, patient examined, no change in status, stable for surgery.  I have reviewed the patient's chart and labs.  Questions were answered to the patient's satisfaction.     Dayana Dalporto

## 2024-06-03 ENCOUNTER — Other Ambulatory Visit: Payer: Self-pay | Admitting: Cardiovascular Disease

## 2024-06-03 DIAGNOSIS — I4821 Permanent atrial fibrillation: Secondary | ICD-10-CM

## 2024-06-03 NOTE — Telephone Encounter (Signed)
 Prescription refill request for Eliquis  received. Indication: A Flutter Last office visit: 05/20/24  A Sabharwal DO Scr: 0.75 on 05/20/24  Epic Age: 68 Weight: 123.1kg  Based on above findings Eliquis  5mg  twice daily is the appropriate dose.  Refill approved.

## 2024-06-11 NOTE — Progress Notes (Addendum)
 [facility] NOTE  Referring Physician: Chrystal Lamarr RAMAN, *  Primary Care: Chrystal Lamarr RAMAN, MD Primary Cardiologist: Heart Failure: Ria Commander, DO  CC: Pulmonary hypertension  HPI: Steve [name] is a 68 y.o. male with atrial flutter, CAD s/p CABG in 2011, HTN, T2DM, chronic osteomyelitis of the right hip and elevated PA pressures by echocardiogram presenting today for follow up.   Pertinent Family & social hx: see below.   Cardiac / Pertinent History:  - CABG in 2011 - Hip replacement in 2019 complicated by femur fracture 1 week later requiring repeat surgery.   - Removal in 2021 due to osteomyelitis.  - Chronic antibiotics due to recurrent right hip osteomyelitis.  - RHC with PVR 3.5, TPG of on 06/02/24   Interval hx:  - RHC with PA mean and PVR of 3.5 with a fairly significant TPG driven primarily by pre-capillary PH.  - Improvement in LE edema after starting scheduled diuretics.  - Lightheadedness and hypotension after taking AM doses of coreg .   PHYSICAL EXAM: Vitals:   06/12/24 0938  BP: 120/68  Pulse: 70  SpO2: 97%   GENERAL: NAD Lungs- normal work of breathing CARDIAC:  JVP: 6 cm          Normal rate with regular rhythm. 2/6 systolic murmur.  Pulses 2+. 1+ in LLE, chronic 2+ edema in the RLE.  ABDOMEN: Soft, non-tender, non-distended.  EXTREMITIES: Warm and well perfused.  NEUROLOGIC: No obvious FND   DATA REVIEW  ECG: 02/18/24: atrial fibrillation  As per my personal interpretation  ECHO: 03/17/24: LVEF 55%, mod-severely reduced RV function; PASP .   CATH: HEMODYNAMICS: RA:                  10 mmHg (mean) RV:                  67/10 mmHg PA:                  67/25 mmHg (39 mean) PCWP:            12 mmHg (mean) with V waves to 17                                      Estimated Fick CO/CI  7.8 L/min, 3.15 L/min/m2 Thermodilution CO/CI  7.9 L/min, 3.2 L/min/m2                                       TPG                 27  mmHg                                            PVR                 3.5 Wood Units  PAPi                4.2    ASSESSMENT & PLAN:  Evaluation for pulmonary hypertension - TTE from 03/17/24 personally reviewed. Septal flattening, dilated RV with reduced function and RVSP of consistent with pulmonary hypertension  - He has had multiple hip surgeries -  Negative lower extremity DVT U/S.  - Schedule V/Q scan today.  - Euvolemic on lasix  20mg  daily now. Counseled on increasing dose as needed.  - start sildenafil  10mg  TID; will uptitrate at APP follow up if he titrates this. Will transition to riociguat if  V/Q scan+. Counseled on uptitrating diuretics as needed with the start of PH therapy.  -  RHC above.    2. CAD s/p CABG - no chest pain  3. Persistent  atrial fibrllation  - EKG today with atrial fibrillation - continue apixban 5mg  BID - d/c coreg ; start toprol  25mg  at bedtime.   4. Hypertension - Off amlodipine  now.  - He has not taken clonidine  for several years now - Reports hypotension with coreg ; will D/C and start toprol  25mg  daily - Losartan  50mg  daily  - Repeat BMP/BNP today.   5. Hip fracture / osteomyelitis  - Followed by ID; planning on needle biopsy to evaluate which bacteria is leading to recurrent osteomyelitis. Most recently had a MRI consistent with infection of the right hip. He is wheelchair bound due to chronic right hip osteomyelits.   I spent 32 minutes caring for this patient today including face to face time, ordering and reviewing labs, reviewing RHC, treatment for PH, seeing the patient, documenting in the record, and arranging follow ups.   [name] [facility] Mechanical Circulatory Support

## 2024-06-12 ENCOUNTER — Ambulatory Visit (HOSPITAL_COMMUNITY)
Admission: RE | Admit: 2024-06-12 | Discharge: 2024-06-12 | Disposition: A | Discharge status: Home / Self Care | Source: Ambulatory Visit | Attending: Cardiology | Admitting: Cardiology

## 2024-06-12 ENCOUNTER — Encounter (HOSPITAL_COMMUNITY): Payer: Self-pay | Admitting: Cardiology

## 2024-06-12 VITALS — BP 120/68 | HR 70 | Wt 258.2 lb

## 2024-06-12 DIAGNOSIS — I10 Essential (primary) hypertension: Secondary | ICD-10-CM | POA: Insufficient documentation

## 2024-06-12 DIAGNOSIS — I2510 Atherosclerotic heart disease of native coronary artery without angina pectoris: Secondary | ICD-10-CM | POA: Diagnosis not present

## 2024-06-12 DIAGNOSIS — I272 Pulmonary hypertension, unspecified: Secondary | ICD-10-CM | POA: Insufficient documentation

## 2024-06-12 DIAGNOSIS — I4891 Unspecified atrial fibrillation: Secondary | ICD-10-CM

## 2024-06-12 DIAGNOSIS — M8660 Other chronic osteomyelitis, unspecified site: Secondary | ICD-10-CM

## 2024-06-12 DIAGNOSIS — I4819 Other persistent atrial fibrillation: Secondary | ICD-10-CM | POA: Diagnosis not present

## 2024-06-12 DIAGNOSIS — I50810 Right heart failure, unspecified: Secondary | ICD-10-CM | POA: Diagnosis not present

## 2024-06-12 DIAGNOSIS — E1169 Type 2 diabetes mellitus with other specified complication: Secondary | ICD-10-CM | POA: Diagnosis not present

## 2024-06-12 DIAGNOSIS — Z993 Dependence on wheelchair: Secondary | ICD-10-CM | POA: Diagnosis not present

## 2024-06-12 DIAGNOSIS — M7989 Other specified soft tissue disorders: Secondary | ICD-10-CM

## 2024-06-12 DIAGNOSIS — Z951 Presence of aortocoronary bypass graft: Secondary | ICD-10-CM | POA: Insufficient documentation

## 2024-06-12 DIAGNOSIS — Z79899 Other long term (current) drug therapy: Secondary | ICD-10-CM | POA: Insufficient documentation

## 2024-06-12 MED ORDER — SILDENAFIL CITRATE 20 MG PO TABS: 10.0000 mg | ORAL_TABLET | Freq: Three times a day (TID) | ORAL | 3 refills | Status: DC

## 2024-06-12 MED ORDER — METOPROLOL SUCCINATE ER 25 MG PO TB24: 25.0000 mg | ORAL_TABLET | Freq: Every day | ORAL | 3 refills | Status: AC

## 2024-06-12 NOTE — Patient Instructions (Signed)
 Medication Changes:  STOP CARVEDILOL    START METOPROLOL  SUCCINATE 25MG  DAILY AT BEDTIME   START SILDENAFIL  10MG  THREE TIMES DAILY   Testing/Procedures:  V/Q SCAN- SCHEDULING WILL REACH OUT TO GET THIS ARRANGED ONCE APPROVED WITH INSURANCE   Follow-Up in: 1 MONTH WITH PHARMACY CLINIC AS SCHEDULED   THEN AGAIN IN 2 MONTHS AS SCHEDULED WITH DR. GARDENIA   At the [facility], you and your health needs are our priority. We have a designated team specialized in the treatment of Heart Failure. This Care Team includes your primary Heart Failure Specialized Cardiologist (physician), Advanced Practice Providers (APPs- Physician Assistants and Nurse Practitioners), and Pharmacist who all work together to provide you with the care you need, when you need it.   You may see any of the following providers on your designated Care Team at your next follow up:  Dr. Toribio Fuel Dr. Ezra Shuck Dr. Ria GARDENIA Dr. Odis Brownie Greig Mosses, NP Caffie Shed, [place] [facility] [place], [place] Beckey Coe, NP [place] Lee, NP Tinnie Redman, PharmD   Please be sure to bring in all your medications bottles to every appointment.   Need to Contact Us :  If you have any questions or concerns before your next appointment please send us  a message through [place] or call our office at [phone].    TO LEAVE A MESSAGE FOR THE NURSE SELECT OPTION 2, PLEASE LEAVE A MESSAGE INCLUDING: YOUR NAME DATE OF BIRTH CALL BACK NUMBER REASON FOR CALL**this is important as we prioritize the call backs  YOU WILL RECEIVE A CALL BACK THE SAME DAY AS LONG AS YOU CALL BEFORE 4:00 PM

## 2024-06-19 NOTE — Progress Notes (Signed)
 [facility]  INFECTIOUS DISEASE OUTPATIENT PROGRESS NOTE ( [place])             Steve [name] Date of Birth: [birth year] MRN: [id number] Patient seen and examined by me on 06/19/2024       Reason for follow up: Chronic osteomyelitis of the right femur   Assessment and Plan: 1.  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  Draining cutaneous sinus tract thigh     He has a past history of right hip prosthetic joint infection status post explantation and also received 6 weeks of IV antibiotic therapy in May 2021.  MRI imaging from 03/10/2024 is concerning for acute on chronic infection.  There is concern for infection of the acetabulum and progression of the chronic osteomyelitis in the femur with sinus tracts extending towards the skin. I have discussed this with Dr. Leah.  Surgery combined with antibiotic therapy would be the best alternative.  However, at this time he is not felt to be a candidate for surgical intervention.  He had an appointment with interventional radiology on 05/07/2023 for biopsy.  However.  He did not make it to that appointment.  On subsequent discussion with radiology, new MRI imaging was requested to determine if there was a new fluid collection that could be sampled.  He is scheduled to get the MRI done later today.  I obtained cultures from the right thigh area and sent to the lab today and further antibiotic treatment plan to be determined based on culture data and imaging studies.    Please note that those non-infectious problems listed above as active problems directly impact the patient's risk for infection, ability to respond to treatment and clear infections, or impact the selection or dosing of antibiotics, or are directly influenced by infection, treatment of infection, or relate to monitoring of  infection and/or potential toxicities of antimicrobial therapy.  Chief complaint:  He has been having drainage from the thigh wound over the last few days.  History of presenting illness: He has a past medical history that is significant for hypertension, coronary artery disease, atrial fibrillation.  He has also had a history of right total hip arthroplasty in May 19, 2018 followed by a revision on 05/29/2018 due to periprosthetic femur fracture.  In June 2020 he was noted to have extensive osteomyelitis with multiple sinus tracts.  In March 2021 he was noted to have right knee swelling and pain.  On 04/12/2020 he underwent a right hip explantation and antibiotic spacer right knee, washout and he was treated with vancomycin and cefepime for 6 weeks for right hip prosthetic joint infection, septic arthritis of the right knee and right femur and proximal tibia osteomyelitis.  Hip cultures were noted to be positive for Pseudomonas and Enterobacter cloacae.    He underwent MRI imaging of the right femur on 03/12/2024.  This was reported to have acute on chronic infection with mild progression of findings of chronic osteomyelitis with multiple femoral cortical bone tracts and sinus tracts.  He had an appointment with interventional radiology on 05/06/2024, however he did not go to that appointment.  Radiology requested that a new MRI be done to see if there was a fluid collection.  However he notes that over the last few days he started having increased drainage from the right thigh wound.  Drainage is  bloody to serosanguineous.    Medications:  Current Antibiotic Drug: Start Date: End date: Duration/Other:                   Current Medications[1]   Allergies: Atorvastatin, Rosuvastatin , Cefepime, and Vancomycin    Medical History[2]  Surgical History[3]  Social History  Social:  Social History   Tobacco Use  . Smoking status: Never  . Smokeless tobacco: Never  Substance Use Topics   . Alcohol use: Yes      Family History  Family history has been reviewed  Family History[4]    Review of systems: Gen: Denies fevers chills HEENT: Denies sore throat or difficulty swallowing, denies vision problems Neck: Denies Neck pain or neck stiffness Cardiovascular: Denies chest pain or palpitations Respiratory: Denies cough, denies productive sputum.  Denies chest pain, No SOB Gastrointestinal: Denies diarrhea.  Denies vomiting.  Denies abdominal pain Genitourinary: Denies dysuria, denies hematuria.  Denies urethral discharge Skin: Denies skin rash   Denies itching Musculoskeletal: Drainage from right thigh area CNS: Denies headache.  Denies vision problems.  Denies weakness or numbness in any extremity Endocrine: Denies increased urinary frequency.  Denies fatigue Hematological: Denies bleeding tendency   Objective  Body mass index is 32.87 kg/m.  Vital signs last 24 hours: Vitals:   06/18/24 1127  BP: (!) 104/58  Pulse: 65  Resp: 18  Temp: 98.8 F (37.1 C)  SpO2: 94%     Physical Exam: General appearance - alert, well appearing, and in no distress and oriented to person, place, and time Mouth - mucous membranes moist, pharynx normal without lesions. Neck  -supple Endocrine-no thyroid enlargement Chest - clear to auscultation, no wheezes, rales or rhonchi, symmetric air entry Heart - normal rate and regular rhythm Abdomen - soft, nontender, nondistended, no masses or organomegaly Neurological - alert, oriented, normal speech, no focal findings or movement disorder noted Extremities - no pedal edema noted Wound examination right thigh open wound with bloody drainage  Labs:   No results for input(s): WBC, HGB, HCT, PLT, NEUTOPHILPCT, EOSPCT in the last 72 hours. No results for input(s): NA, K, CL, CO2, BUN, CREATININE, CALCIUM , PROT, BILITOT, ALP, ALT, AST, GLUCOSE in the last 72 hours.  Lab Results  Component  Value Date   NA 135 (L) 04/16/2024   NA 137 05/30/2020   K 4.6 04/16/2024   K 3.9 05/30/2020   CL 103 04/16/2024   CL 111 (H) 05/30/2020   CO2 27 04/16/2024   CO2 18 (L) 05/30/2020   BUN 16 04/16/2024   BUN 16 05/30/2020   GLUCOSE 88 04/16/2024   GLUCOSE 68 (L) 05/30/2020   CREATININE 0.62 (L) 04/16/2024   CREATININE 0.61 05/30/2020   CALCIUM  8.4 (L) 04/16/2024   CALCIUM  7.1 (L) 05/30/2020   PROT 5.9 (L) 04/16/2024   PROT 6.2 05/30/2020   ALBUMIN  3.2 (L) 04/16/2024   ALBUMIN  3.1 (L) 05/30/2020   BILITOT 1.1 (H) 04/16/2024   BILITOT 0.8 05/30/2020   ALP 92 04/16/2024   ALP 93 05/30/2020   AST 29 04/16/2024   AST 38 05/30/2020   ALT 32 04/16/2024   ALT 27 05/30/2020   ANIONGAP 5 (L) 04/16/2024   ANIONGAP 8 05/30/2020    Lab Results  Component Value Date   WBC 4.70 04/16/2024   WBC 5.7 05/30/2020   WBC 5.7 05/23/2020   WBC 9.2 05/15/2020   CREATININE 0.62 (L) 04/16/2024   CREATININE 0.61 05/30/2020   CREATININE 0.77 05/23/2020   CREATININE 0.81  05/15/2020    No results found for: COLORU, SPECGRAV, GLUCOSEU, KETONEU, BLOODU, LEUKOUA, PHUR, ALBUR, BILIRUBINUR, UROBILINOGEN, RBCU, WBCU, BACTERIA, ERYTHROBM    Microbiology: Results for orders placed or performed in visit on 06/18/24 (from the past week)  Aerobic Culture   Specimen: Thigh, Right; Drainage  Result Value Ref Range   Gram Stain Result No PMNs, no organisms seen.      Imaging ( personally reviewed by me):   Cardiac catheterization Result Date: 06/02/2024 This result has an attachment that is not available. HEMODYNAMICS: RA:   10 mmHg (mean) RV:   67/10 mmHg PA:   67/25 mmHg (39 mean) PCWP:  12 mmHg (mean) with V waves to 17    Estimated Fick CO/CI  7.8 L/min, 3.15 L/min/m2 Thermodilution CO/CI  7.9 L/min, 3.2 L/min/m2    TPG    27  mmHg     PVR     3.5 Wood Units PAPi      4.2   IMPRESSION: Elevated right heart filling pressures, PA mean and PVR consistent with predominant  pre capillary pulmonary hypertension. Normal PCWP with small V waves Normal cardiac output & index Aditya Sabharwal 8:24 AM Technical Details The patient was prepped and draped in the usual sterile fashion for a right cephalic approach. Lidocaine  2% local anesthesia was used for appropriate anesthesia at the access sites. After anesthesia, a 4 French Ministick catheter was used to access the right cephalic vein using the modified Seldinger technique under ultrasound guidance. The 4 [place] Ministick was then exchanged over a wire for a 7Fr [place] sheath. Via the venous access, a 7Fr [place] Swan-Ganz catheter was advanced through the right heart chambers under fluoroscopic guidance and hemodynamic assessment was performed in each chamber. In the pulmonary artery, cardiac output and cardiac index were determined using both estimated Fick & TD. After obtaining all necessary hemodynamics, the Swan-Ganz catheter and venous sheath were removed. Hemostasis was obtained with manual pressure. There were no immediate complications. Estimated blood loss <50 mL. During this procedure no sedation was administered.  VAS US  LOWER EXTREMITY VENOUS (DVT) Result Date: 05/28/2024 This result has an attachment that is not available.  Lower Venous DVT Study Patient Name:  Steve [name]  Date of Exam:   05/28/2024 Medical Rec #: [id number]        Accession #:    [id number] Date of Birth: [birth year]        Patient Gender: M Patient Age:   68 years Exam Location:  [address] Procedure:      VAS US  LOWER EXTREMITY VENOUS (DVT) Referring Phys: RIA COMMANDER -------------------------------------------------------------------------------- Other Indications: Bilateral lower extremity pitting edema, fluid overload. Risk Factors: Obesity. Anticoagulation: Eliquis . Limitations: Poor ultrasound/tissue interface and body habitus. Performing Technologist: Edsel Mustard RVT Examination Guidelines: A complete evaluation includes B-mode  imaging, spectral Doppler, color Doppler, and power Doppler as needed of all accessible portions of each vessel. Bilateral testing is considered an integral part of a complete examination. Limited examinations for reoccurring indications may be performed as noted. The reflux portion of the exam is performed with the patient in reverse Trendelenburg. +--------+---------------+---------+-----------+----------+--------------------+ RIGHT   CompressibilityPhasicitySpontaneityPropertiesThrombus Aging       +--------+---------------+---------+-----------+----------+--------------------+ CFV     Full           Yes      Yes                                       +--------+---------------+---------+-----------+----------+--------------------+  SFJ     Full           Yes      Yes                                       +--------+---------------+---------+-----------+----------+--------------------+ FV Prox Full           Yes      Yes                                       +--------+---------------+---------+-----------+----------+--------------------+ FV Mid  Full           Yes      Yes                                       +--------+---------------+---------+-----------+----------+--------------------+ FV                     Yes      Yes                  patent by doppler    Distal                                                                    +--------+---------------+---------+-----------+----------+--------------------+ PFV     Full                                                              +--------+---------------+---------+-----------+----------+--------------------+ POP     Full           Yes      Yes                                       +--------+---------------+---------+-----------+----------+--------------------+ PTV                                                  patent by color                                                            doppler              +--------+---------------+---------+-----------+----------+--------------------+ PERO  patent by color                                                           doppler              +--------+---------------+---------+-----------+----------+--------------------+ GSV     Full           Yes      Yes                                       +--------+---------------+---------+-----------+----------+--------------------+ Right Technical Findings: Calf veins not well visualized due to edema +---------+---------------+---------+-----------+----------+--------------+ LEFT     CompressibilityPhasicitySpontaneityPropertiesThrombus Aging +---------+---------------+---------+-----------+----------+--------------+ CFV      Full           Yes      Yes                                 +---------+---------------+---------+-----------+----------+--------------+ SFJ      Full           Yes      Yes                                 +---------+---------------+---------+-----------+----------+--------------+ FV Prox  Full           Yes      Yes                                 +---------+---------------+---------+-----------+----------+--------------+ FV Mid   Full           Yes      Yes                                 +---------+---------------+---------+-----------+----------+--------------+ FV DistalFull                                                        +---------+---------------+---------+-----------+----------+--------------+ PFV      Full                                                        +---------+---------------+---------+-----------+----------+--------------+ POP      Full           Yes      Yes                                 +---------+---------------+---------+-----------+----------+--------------+ PTV      Full                                                         +---------+---------------+---------+-----------+----------+--------------+  PERO     Full                                                        +---------+---------------+---------+-----------+----------+--------------+ Gastroc  Full                                                        +---------+---------------+---------+-----------+----------+--------------+ GSV      Full                                                        +---------+---------------+---------+-----------+----------+--------------+ Summary: RIGHT: - No evidence of deep vein thrombosis in the lower extremity. No indirect evidence of obstruction proximal to the inguinal ligament. - No cystic structure found in the popliteal fossa. - Ultrasound characteristics of enlarged lymph node noted in the groin. LEFT: - No evidence of deep vein thrombosis in the lower extremity. No indirect evidence of obstruction proximal to the inguinal ligament. - No cystic structure found in the popliteal fossa. *See table(s) above for measurements and observations. Electronically signed by Fonda Rim on 05/28/2024 at 4:34:28 PM.   Final     I personally reviewed patients records (including a review of problem list, current meds), labs, imaging and any consultant notes      Tawanna Sheen, MD [facility]-Infectious Disease- [facility] [phone]  This record has been created using Dragon voice recognition software.         [1]  Current Outpatient Medications:  .  amLODIPine  (NORVASC ) 10 mg tablet, Take 10 mg by mouth Once Daily. (Patient not taking: Reported on 04/16/2024), Disp: , Rfl:  .  apixaban  (Eliquis ) 5 mg tab, Take 5 mg by mouth 2 (two) times a day., Disp: , Rfl:  .  ascorbic acid (VITAMIN C) 1,000 mg tablet, Take 1,000 mg by mouth Once Daily., Disp: , Rfl:  .  carvediloL  (COREG ) 12.5 mg tablet, Take 12.5 mg by mouth 2 (two) times a day with meals., Disp: , Rfl:  .  cetirizine (ZyrTEC) 10 mg  tablet, Take 10 mg by mouth daily as needed., Disp: , Rfl:  .  cholecalciferol 250 mcg (10,000 unit) cap, Take 10,000 Units by mouth Once Daily., Disp: , Rfl:  .  clobetasoL  0.05 % lotn, Apply  topically daily as needed., Disp: , Rfl:  .  cloNIDine  (CATAPRES ) 0.1 mg tablet, Take 0.3 mg by mouth 2 (two) times a day. (Patient not taking: Reported on 04/16/2024), Disp: , Rfl:  .  digestive enzymes (Enzyme Digest) cap, Take 1 capsule by mouth 2 (two) times a day., Disp: , Rfl:  .  diphenhydrAMINE (BENADRYL) 25 mg tablet, Take 25 mg by mouth nightly as needed for itching., Disp: , Rfl:  .  empagliflozin  (Jardiance ) 25 mg tab, Take 25 mg by mouth Once Daily., Disp: , Rfl:  .  ezetimibe  (ZETIA ) 10 mg tablet, Take 10 mg by mouth nightly., Disp: , Rfl:  .  ferrous sulfate 325 mg (65 mg iron) tablet, Take  325 mg by mouth 2 (two) times a day after meals for 30 days., Disp: 60 tablet, Rfl: 1 .  furosemide  (LASIX ) 20 mg tablet, Take 40 mg by mouth Once Daily., Disp: , Rfl:  .  gabapentin (NEURONTIN) 100 mg capsule, Take 100 mg by mouth 2 (two) times a day., Disp: , Rfl:  .  glipiZIDE  (GLUCOTROL  XL) 5 mg 24 hr tablet, Take 5 mg by mouth 2 (two) times a day with meals. (Patient not taking: Reported on 04/16/2024), Disp: , Rfl:  .  licorice root extract, bulk, powd, by miscellaneous route., Disp: , Rfl:  .  losartan  (COZAAR ) 50 mg tablet, TAKE 1 TABLET BY MOUTH EVERY DAY, Disp: , Rfl:  .  lysine 500 mg tab, Take  by mouth., Disp: , Rfl:  .  magnesium 200 mg tab, Take 1 capsule by mouth Once Daily., Disp: , Rfl:  .  magnesium oxide 400 mg (241 mg magnesium) tab, Take 400 mg by mouth Once Daily., Disp: , Rfl:  .  melatonin-pyridoxine HCl, B6, 10-10 mg TbIE, Take 10 mg by mouth nightly., Disp: , Rfl:  .  potassium chloride  (MICRO-K ) 10 mEq CR capsule, Take 10 mEq by mouth nightly., Disp: , Rfl:  .  quercetin dihydrate, bulk, 100 % powd, by miscellaneous route., Disp: , Rfl:  .  sennosides-docusate sodium (PERICOLACE)  8.6-50 mg per tablet, TAKE 2 TABLETS BY MOUTH 2 TIMES DAILY., Disp: 60 tablet, Rfl: 0 .  SITagliptin-metFORMIN (Janumet ) 50-500 mg per tablet, Take 1 tablet by mouth 2 (two) times a day with meals., Disp: , Rfl:  .  tiZANidine (ZANAFLEX) 4 mg capsule, Take 4 mg by mouth nightly. (Patient not taking: Reported on 04/16/2024), Disp: , Rfl:  .  turmeric 400 mg cap, Take  by mouth., Disp: , Rfl:  No current facility-administered medications for this visit. [2] Past Medical History: Diagnosis Date  . A-fib    (CMD)   . CHF (congestive heart failure)    (CMD)   . Coronary artery disease   . Diabetes mellitus type II, controlled    (CMD)   . Heart disease   . Hypertension   . Myocardial infarction    (CMD)   [3] Past Surgical History: Procedure Laterality Date  . CORONARY ARTERY BYPASS GRAFT  2011   Procedure: CORONARY ARTERY BYPASS GRAFT  . DEBRIDEMENT LEG Right 04/12/2020   Procedure: IRRIGATION & DEBRIDEMENT LEG;  Surgeon: Bryan Vinie Haus, MD;  Location: [facility] MAIN OR;  Service: Orthopedics;  Laterality: Right;  . FEMUR FRACTURE SURGERY     Procedure: FEMUR FRACTURE SURGERY  . HIP FRACTURE SURGERY     Procedure: HIP FRACTURE SURGERY  . REVISION TOTAL HIP ARTHROPLASTY Right 04/12/2020   Procedure: TOTAL HIP REVISION;  Surgeon: Bryan Vinie Haus, MD;  Location: [facility] MAIN OR;  Service: Orthopedics;  Laterality: Right;  . TOTAL HIP ARTHROPLASTY     Procedure: TOTAL HIP REPLACEMENT  [4] No family history on file.

## 2024-06-24 ENCOUNTER — Encounter (HOSPITAL_COMMUNITY)
Admission: RE | Admit: 2024-06-24 | Discharge: 2024-06-24 | Disposition: A | Discharge status: Home / Self Care | Source: Ambulatory Visit | Attending: Cardiology | Admitting: Cardiology

## 2024-06-24 ENCOUNTER — Ambulatory Visit (HOSPITAL_COMMUNITY): Payer: Self-pay | Admitting: Cardiology

## 2024-06-24 ENCOUNTER — Ambulatory Visit (HOSPITAL_COMMUNITY)
Admission: RE | Admit: 2024-06-24 | Discharge: 2024-06-24 | Disposition: A | Discharge status: Home / Self Care | Source: Ambulatory Visit | Attending: Cardiology | Admitting: Cardiology

## 2024-06-24 DIAGNOSIS — I272 Pulmonary hypertension, unspecified: Secondary | ICD-10-CM | POA: Insufficient documentation

## 2024-06-24 MED ORDER — TECHNETIUM TO 99M ALBUMIN AGGREGATED
4.0000 | Freq: Once | INTRAVENOUS | Status: AC
  Administered 2024-06-24: 4.16 via INTRAVENOUS

## 2024-06-25 ENCOUNTER — Telehealth (HOSPITAL_COMMUNITY): Payer: Self-pay

## 2024-06-25 ENCOUNTER — Other Ambulatory Visit (HOSPITAL_COMMUNITY): Payer: Self-pay

## 2024-06-25 NOTE — Telephone Encounter (Signed)
 Advanced Heart Failure Patient Advocate Encounter  Prior authorization for Adempas has been submitted and approved. Test billing returns $0 for 30 day supply.  Key: [id number] Effective: 12/04/2023 to 12/02/2024  Rachel DEL, CPhT Rx Patient Advocate Phone: [phone]

## 2024-06-25 NOTE — Telephone Encounter (Addendum)
 Patient aware of results. Patient  states that insurance has declined Revatio  and needs prior authorization. Will  forward to pharmacy team.   ----- Message from Ria Commander sent at 06/24/2024  2:37 PM EDT ----- Can we please call and let him know he likely had a clot in his lungs leading to his pulmonary hypertension.   Adi  ----- Message ----- From: Interface, Rad Results In Sent: 06/24/2024  12:05 PM EDT To: Ria Commander, DO

## 2024-06-26 ENCOUNTER — Telehealth (HOSPITAL_COMMUNITY): Payer: Self-pay | Admitting: Pharmacy Technician

## 2024-06-26 ENCOUNTER — Other Ambulatory Visit (HOSPITAL_COMMUNITY): Payer: Self-pay

## 2024-06-26 NOTE — Telephone Encounter (Signed)
 Patient Advocate Encounter   Received notification from [facility] that prior authorization for Sildenafil  is required.   PA submitted on CoverMyMeds Key [id number] Status is pending   Will continue to follow.

## 2024-06-26 NOTE — Telephone Encounter (Signed)
 Advanced Heart Failure Patient Advocate Encounter  Prior Authorization for Sildenafil  has been approved.    PA# [id number] Effective dates: 12/04/23 through 12/02/24  Patients co-pay is $0  Almarie JULIANNA Pa, CPhT

## 2024-06-29 NOTE — Telephone Encounter (Signed)
 New start forms faxed to [employer]. Confirmation of enrollment received 06/26/2024

## 2024-07-01 ENCOUNTER — Other Ambulatory Visit (HOSPITAL_COMMUNITY): Payer: Self-pay

## 2024-07-02 NOTE — Telephone Encounter (Signed)
 Orders were placed by ID yesterday. Dr Donavan, did you reach out to patient?

## 2024-07-02 NOTE — Telephone Encounter (Signed)
 Patient states he is returning a call to provider  Call back [phone]

## 2024-07-03 NOTE — Progress Notes (Signed)
 Advanced Heart Failure Clinic Note   Referring Physician: Chrystal Lamarr RAMAN, *  Primary Care: Chrystal Lamarr RAMAN, MD Primary Cardiologist: Heart Failure: Ria Commander, DO  HPI:  Steve [name] is a 68 y.o. male with atrial flutter, CAD s/p CABG in 2011, HTN, T2DM, chronic osteomyelitis of the right hip and elevated PA pressures by echocardiogram. 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   Pertinent Family & social hx: see below.    Cardiac / Pertinent History:  - CABG in 2011 - Hip replacement in 2019 complicated by femur fracture 1 week later requiring repeat surgery.   - Removal in 2021 due to osteomyelitis.  - Chronic antibiotics due to recurrent right hip osteomyelitis.  - RHC with PVR 3.5, TPG of on 06/02/24   Presented to AHF Clinic 06/12/24 with Dr. Commander. RHC with PA mean and PVR of 3.5 with a fairly significant TPG driven primarily by pre-capillary PH. Noted improvement in LE edema after starting scheduled diuretics. Had lightheadedness and hypotension after taking AM doses of carvedilol .   NM Pulmonary Perfusiuon scan 06/24/24: Intermediate probability for pulmonary embolus. Patient was referred for Adempas  and paperwork initiated.   Today he presents for telephone visit to pharmacy clinic.  At last visit with MD he was started on sildenafil  10 mg TID with plan for further workup to determine if patient has CTEPH. NM Pulmonary Perfusion scan with intermediate probability for pulmonary embolus. Patient was referred for Adempas  and paperwork initiated. Once approved will stop sildenafil , hold for 24 hours, then can start Adempas  at 0.5 mg TID. Additionally, carvedilol  12.5 mg was stopped and metoprolol  succinate 25 mg daily was initiated due to lightheadedness/hypotension after morning doses of carvedilol . Had MRI and culture of R hip and femur 06/18/24, per ID at [facility], plan is to treat with IV daptomycin and p.o. ciprofloxacin for a tentative 6-week  course followed by oral suppression.   He notes he was able to start Adempas  0.5 mg TID Monday of this week and is tolerating it well. Gets Adempas  from [facility]. He appropriately stopped sildenafil  when transitioning. Notes since last visit his BP has been stable at 118-120/60s and he has not felt lightheaded. BP this morning was 118/62. Pulse ox at home was 98%. Also feels like his LEE has improved after changing carvedilol  to metoprolol  and generally has felt better since last visit.     Vital Signs: Unable to obtain (telephone visit)   Assessment/Plan: Evaluation for pulmonary hypertension - TTE from 03/17/24: Septal flattening, dilated RV with reduced function and RVSP of consistent with pulmonary hypertension  - He has had multiple hip surgeries - Negative lower extremity DVT U/S.  - NM Pulmonary Perfusiuon scan 06/24/24: Intermediate  - Euvolemic on Lasix  20 mg daily now.  - Continue Adempas  0.5 mg TID with plans to uptitrate as tolerated to target dose of 2.5 mg TID. Medication list updated to reflect discontinuation of sildenafil  and start of Adempas .  2. CAD s/p CABG - no chest pain   3. Persistent  atrial fibrllation  - EKG 06/12/24 noted atrial fibrillation - Continue Eliquis  5 mg BID - Continue metoprolol  succinate 25 mg at bedtime. Tolerating metoprolol  better than carvedilol .    4. Hypertension - Off amlodipine  now.  - He has not taken clonidine  for several years now - Continue metoprolol  succinate 25 mg daily. Carvedilol  discontinued last visit due to hypotension and is feeling much better after this transition.   - Continue  Losartan  50 mg daily    5. Hip fracture / osteomyelitis  - Followed by ID; Had MRI and culture of R hip and femur 06/18/24, per ID at [facility], plan is to treat with IV daptomycin and p.o. ciprofloxacin for a tentative 6-week course followed by oral suppression.  He is wheelchair bound due to chronic right hip  osteomyelitis.  Follow up 6 weeks with Dr. Gardenia Tinnie Redman, PharmD, BCPS, [facility], CPP Heart Failure Clinic Pharmacist [phone]

## 2024-07-08 ENCOUNTER — Telehealth (HOSPITAL_COMMUNITY): Payer: Self-pay

## 2024-07-08 NOTE — Telephone Encounter (Signed)
 Advanced Heart Failure Patient Advocate Encounter  Received shipment notification:  Patient Adempas ID: [id number] Patient DOB: June 03, 1956 Pharmacy: [facility] Date: 07/07/2024  Rachel DEL, CPhT Rx Patient Advocate Phone: [phone]

## 2024-07-14 ENCOUNTER — Ambulatory Visit (HOSPITAL_COMMUNITY)
Admission: RE | Admit: 2024-07-14 | Discharge: 2024-07-14 | Disposition: A | Discharge status: Home / Self Care | Source: Ambulatory Visit | Attending: Cardiology | Admitting: Cardiology

## 2024-07-14 DIAGNOSIS — I2724 Chronic thromboembolic pulmonary hypertension: Secondary | ICD-10-CM

## 2024-07-14 MED ORDER — ADEMPAS 0.5 MG PO TABS: 0.5000 mg | ORAL_TABLET | Freq: Three times a day (TID) | ORAL | Status: DC

## 2024-07-29 ENCOUNTER — Other Ambulatory Visit (HOSPITAL_COMMUNITY): Payer: Self-pay | Admitting: Cardiology

## 2024-07-31 ENCOUNTER — Other Ambulatory Visit: Payer: Self-pay | Admitting: Physician Assistant

## 2024-08-04 ENCOUNTER — Other Ambulatory Visit: Payer: Self-pay

## 2024-08-05 MED ORDER — EZETIMIBE 10 MG PO TABS: 10.0000 mg | ORAL_TABLET | Freq: Every day | ORAL | 1 refills | Status: AC

## 2024-08-07 ENCOUNTER — Telehealth (HOSPITAL_COMMUNITY): Payer: Self-pay

## 2024-08-07 NOTE — Telephone Encounter (Signed)
 Advanced Heart Failure Patient Advocate Encounter   Received shipment notification:   Patient Adempas  ID: [id number] Patient DOB: [birth year] [facility] Date: 08/06/2024   Rachel DEL, CPhT Rx Patient Advocate Phone: [phone]

## 2024-08-12 ENCOUNTER — Other Ambulatory Visit (HOSPITAL_COMMUNITY): Payer: Self-pay | Admitting: Cardiology

## 2024-08-24 ENCOUNTER — Telehealth (HOSPITAL_COMMUNITY): Payer: Self-pay

## 2024-08-24 NOTE — Telephone Encounter (Signed)
 Advanced Heart Failure Patient Advocate Encounter   Received notification that Adempas  has been shipped to patient by [facility] on 08/21/2024.   Rachel DEL, CPhT Rx Patient Advocate Phone: [phone]

## 2024-08-31 ENCOUNTER — Encounter (HOSPITAL_COMMUNITY): Admitting: Cardiology

## 2024-09-09 ENCOUNTER — Other Ambulatory Visit (HOSPITAL_COMMUNITY): Payer: Self-pay | Admitting: Cardiology

## 2024-09-25 ENCOUNTER — Other Ambulatory Visit (HOSPITAL_COMMUNITY): Payer: Self-pay

## 2024-09-25 ENCOUNTER — Ambulatory Visit (HOSPITAL_COMMUNITY)
Admission: RE | Admit: 2024-09-25 | Discharge: 2024-09-25 | Disposition: A | Discharge status: Home / Self Care | Source: Ambulatory Visit | Attending: Cardiology | Admitting: Cardiology

## 2024-09-25 ENCOUNTER — Telehealth (HOSPITAL_COMMUNITY): Payer: Self-pay

## 2024-09-25 VITALS — BP 108/60 | HR 78 | Wt 265.0 lb

## 2024-09-25 DIAGNOSIS — E119 Type 2 diabetes mellitus without complications: Secondary | ICD-10-CM | POA: Insufficient documentation

## 2024-09-25 DIAGNOSIS — I2724 Chronic thromboembolic pulmonary hypertension: Secondary | ICD-10-CM | POA: Insufficient documentation

## 2024-09-25 DIAGNOSIS — I4821 Permanent atrial fibrillation: Secondary | ICD-10-CM | POA: Insufficient documentation

## 2024-09-25 DIAGNOSIS — I110 Hypertensive heart disease with heart failure: Secondary | ICD-10-CM | POA: Diagnosis not present

## 2024-09-25 DIAGNOSIS — I50810 Right heart failure, unspecified: Secondary | ICD-10-CM

## 2024-09-25 DIAGNOSIS — Z79899 Other long term (current) drug therapy: Secondary | ICD-10-CM | POA: Diagnosis not present

## 2024-09-25 DIAGNOSIS — I878 Other specified disorders of veins: Secondary | ICD-10-CM | POA: Diagnosis not present

## 2024-09-25 DIAGNOSIS — I340 Nonrheumatic mitral (valve) insufficiency: Secondary | ICD-10-CM | POA: Insufficient documentation

## 2024-09-25 DIAGNOSIS — E782 Mixed hyperlipidemia: Secondary | ICD-10-CM

## 2024-09-25 DIAGNOSIS — Z7984 Long term (current) use of oral hypoglycemic drugs: Secondary | ICD-10-CM | POA: Diagnosis not present

## 2024-09-25 DIAGNOSIS — I272 Pulmonary hypertension, unspecified: Secondary | ICD-10-CM

## 2024-09-25 DIAGNOSIS — I509 Heart failure, unspecified: Secondary | ICD-10-CM | POA: Diagnosis not present

## 2024-09-25 DIAGNOSIS — Z951 Presence of aortocoronary bypass graft: Secondary | ICD-10-CM | POA: Diagnosis not present

## 2024-09-25 DIAGNOSIS — I4892 Unspecified atrial flutter: Secondary | ICD-10-CM | POA: Insufficient documentation

## 2024-09-25 DIAGNOSIS — Z7901 Long term (current) use of anticoagulants: Secondary | ICD-10-CM | POA: Diagnosis not present

## 2024-09-25 DIAGNOSIS — I2510 Atherosclerotic heart disease of native coronary artery without angina pectoris: Secondary | ICD-10-CM | POA: Insufficient documentation

## 2024-09-25 DIAGNOSIS — M86651 Other chronic osteomyelitis, right thigh: Secondary | ICD-10-CM | POA: Diagnosis not present

## 2024-09-25 DIAGNOSIS — R609 Edema, unspecified: Secondary | ICD-10-CM

## 2024-09-25 LAB — BRAIN NATRIURETIC PEPTIDE: B Natriuretic Peptide: 351.2 pg/mL — ABNORMAL HIGH (ref 0.0–100.0)

## 2024-09-25 LAB — BASIC METABOLIC PANEL WITH GFR
Anion gap: 7 (ref 5–15)
BUN: 19 mg/dL (ref 8–23)
CO2: 28 mmol/L (ref 22–32)
Calcium: 8 mg/dL — ABNORMAL LOW (ref 8.9–10.3)
Chloride: 103 mmol/L (ref 98–111)
Creatinine, Ser: 0.83 mg/dL (ref 0.61–1.24)
GFR, Estimated: >60 mL/min (ref >60)
Glucose, Bld: 103 mg/dL — ABNORMAL HIGH (ref 70–99)
Potassium: 4.7 mmol/L (ref 3.5–5.1)
Sodium: 138 mmol/L (ref 135–145)

## 2024-09-25 LAB — LIPID PANEL
Cholesterol: 119 mg/dL (ref 0–200)
HDL: 34 mg/dL — ABNORMAL LOW (ref >40)
LDL Cholesterol: 70 mg/dL (ref 0–99)
Total CHOL/HDL Ratio: 3.5 ratio
Triglycerides: 76 mg/dL (ref <150)
VLDL: 15 mg/dL (ref 0–40)

## 2024-09-25 MED ORDER — SPIRONOLACTONE 25 MG PO TABS: 12.5000 mg | ORAL_TABLET | Freq: Every day | ORAL | 3 refills | Status: AC

## 2024-09-25 MED ORDER — FUROSEMIDE 20 MG PO TABS: 40.0000 mg | ORAL_TABLET | Freq: Every day | ORAL | 3 refills | Status: DC

## 2024-09-25 NOTE — Patient Instructions (Signed)
 You will be contacted about your Opsumit.  INCREASE Lasix  to 40 mg daily.  START Spironolactone 12.5 mg ( 1/2 tab) daily.  Labs done today, your results will be available in MyChart, we will contact you for abnormal readings.  Your physician has requested that you have cardiac CT. Cardiac computed tomography (CT) is a painless test that uses an x-ray machine to take clear, detailed pictures of your heart. For further information please visit [email or web address] Please follow instruction sheet as given.  Your provider has ordered a v/q Scan for you. You will be called to have this test arranged.  Your physician recommends that you schedule a follow-up appointment in: 1 month  If you have any questions or concerns before your next appointment please send us  a message through [place] or call our office at [phone].    TO LEAVE A MESSAGE FOR THE NURSE SELECT OPTION 2, PLEASE LEAVE A MESSAGE INCLUDING: YOUR NAME DATE OF BIRTH CALL BACK NUMBER REASON FOR CALL**this is important as we prioritize the call backs  YOU WILL RECEIVE A CALL BACK THE SAME DAY AS LONG AS YOU CALL BEFORE 4:00 PM  At the Advanced Heart Failure Clinic, you and your health needs are our priority. As part of our continuing mission to provide you with exceptional heart care, we have created designated Provider Care Teams. These Care Teams include your primary Cardiologist (physician) and Advanced Practice Providers (APPs- Physician Assistants and Nurse Practitioners) who all work together to provide you with the care you need, when you need it.   You may see any of the following providers on your designated Care Team at your next follow up: Dr Toribio Fuel Dr Ezra Shuck Dr. Ria Commander Dr. Morene Brownie Amy Lenetta, NP Caffie Shed, [place] [facility] [place], [place] Beckey Coe, NP [place] Lee, NP Ellouise Class, NP Tinnie Redman, PharmD Jaun Bash, PharmD   Please be sure to bring in all  your medications bottles to every appointment.    Thank you for choosing [facility]

## 2024-09-26 LAB — ENA+DNA/DS+SJORGEN'S
ENA SM Ab Ser-aCnc: <0.2 AI (ref 0.0–0.9)
Ribonucleic Protein: 1.4 AI — ABNORMAL HIGH (ref 0.0–0.9)
SSA (Ro) (ENA) Antibody, IgG: <0.2 AI (ref 0.0–0.9)
SSB (La) (ENA) Antibody, IgG: <0.2 AI (ref 0.0–0.9)
ds DNA Ab: <1 [IU]/mL (ref 0–9)

## 2024-09-26 LAB — CENTROMERE ANTIBODIES: Centromere Ab Screen: <0.2 AI (ref 0.0–0.9)

## 2024-09-26 LAB — ANA W/REFLEX: Anti Nuclear Antibody (ANA): POSITIVE — AB

## 2024-09-26 LAB — ANTI-SCLERODERMA ANTIBODY: Scleroderma (Scl-70) (ENA) Antibody, IgG: <0.2 AI (ref 0.0–0.9)

## 2024-09-27 ENCOUNTER — Ambulatory Visit (HOSPITAL_COMMUNITY): Payer: Self-pay | Admitting: Cardiology

## 2024-09-27 DIAGNOSIS — I272 Pulmonary hypertension, unspecified: Secondary | ICD-10-CM

## 2024-09-27 LAB — RHEUMATOID FACTOR: Rheumatoid fact SerPl-aCnc: <10 [IU]/mL (ref <14.0)

## 2024-09-27 NOTE — Progress Notes (Signed)
 ADVANCED HEART FAILURE CLINIC NOTE  Primary Care: Chrystal Lamarr RAMAN, MD Heart Failure Cardiology: Dr. Rolan  CC: Pulmonary hypertension  HPI: Steve [name] is a 68 y.o. male with atrial flutter, CAD s/p CABG in 2011, HTN, T2DM, chronic osteomyelitis of the right hip and elevated PA pressures by echocardiogram presenting today for follow up of CHF.  I am seeing him for the first time (previously followed by Dr. Gardenia).  I have reviewed all his prior records.   Patient had a hip replacement in 2019 complicated by femur fracture 1 week later requiring repeat surgery.  Prosthesis was removed in 2021 due to osteomyelitis.  He has been on chronic antibiotics due to recurrent right hip osteomyelitis.  He is essentially wheelchair-bound at this point.k   He is in permanent atrial fibrillation and has been on apixaban .   Echo was done in 4/25, showing EF 55-60%, asymmetric septal hypertrophy, akinesis of the basal inferior wall, D-shaped septum, moderate RV dysfunction with moderate RV enlargement, PASP 75 mmHg, severe biatrial enlargement, mild MR, dilated IVC.  Given RV failure/pulmonary hypertension by echo, RHC was done in 7/25 showing moderate pulmonary arterial hypertension.  Perfusion only lung scan in 7/25 showed a perfusion defect concerning for chronic PE. Lower extremity venous dopplers in 6/25 did not show evidence for DVT.   Patient uses wheelchair most of the time, he is able to hop short distances in the house with his walker.  No significant dyspnea though not very active.  No chest pain.  No orthopnea/PND.  No lightheadedness. Weight up 7 lbs since last appointment.  He has significant lower extremity edema.   Labs (10/25): K 4.1, creatinine 0.97  ECG (personally reviewed): Atrial fibrillation with left axis deviation  PMH: 1. CAD: CABG x 4 in 2011.  2. Hip replacement in 2019 complicated by femur fracture 1 week later requiring repeat surgery.   - Removal of  prosthesis in 2021 due to osteomyelitis.  - Chronic antibiotics due to recurrent right hip osteomyelitis.  3. HTN 4. Type 2 diabetes 5. Atrial fibrillation: Permanent.  - RHC with PVR 3.5, TPG of on 06/02/24 6. Pulmonary hypertension/RV failure:  - Echo (4/25): EF 55-60%, asymmetric septal hypertrophy, akinesis of the basal inferior wall, D-shaped septum, moderate RV dysfunction with moderate RV enlargement, PASP 75 mmHg, severe biatrial enlargement, mild MR, dilated IVC.  - Venous dopplers (6/25): No DVT.  - RHC (7/25): mean RA 10, PA 67/25 mean 39, mean PCWP 12, CI 3.15 Fick/3.2 thermo, PVR 3.5 WU.  - Perfusion scan (7/25): Perfusion defect that suggested chronic PE.  7. Hyperlipidemia: Statin intolerance.    Social History   Socioeconomic History   Marital status: Divorced    Spouse name: Not on file   Number of children: Not on file   Years of education: Not on file   Highest education level: Not on file  Occupational History   Not on file  Tobacco Use   Smoking status: Never   Smokeless tobacco: Never  Vaping Use   Vaping status: Never Used  Substance and Sexual Activity   Alcohol use: Yes    Alcohol/week: 2.0 standard drinks of alcohol    Types: 1 Glasses of wine, 1 Shots of liquor per week    Comment: 07/02/2017 I don't drink q wk   Drug use: No   Sexual activity: Yes    Partners: Female  Other Topics Concern   Not on file  Social History Narrative   Not  on file   Social Drivers of Health   Financial Resource Strain: Not on file  Food Insecurity: Not on file  Transportation Needs: Not on file  Physical Activity: Not on file  Stress: Not on file  Social Connections: Not on file  Intimate Partner Violence: Not on file   Family History  Problem Relation Age of Onset   CAD Mother    CAD Father    ROS: All systems reviewed and negative except as per HPI.   Current Outpatient Medications  Medication Sig Dispense Refill   ADEMPAS  2.5 MG TABS Take 1  tablet three times a day. Dose changes may occur throughout the course of therapy as directed by your prescriber. 90 tablet 1   apixaban  (ELIQUIS ) 5 MG TABS tablet Take 1 tablet (5 mg total) by mouth 2 (two) times daily. 180 tablet 1   Apoaequorin (PREVAGEN EXTRA STRENGTH) 20 MG CAPS Take 20 mg by mouth daily.     cetirizine (ZYRTEC) 10 MG tablet Take 10 mg by mouth 2 (two) times daily.     Cholecalciferol (VITAMIN D3) 250 MCG (10000 UT) capsule Take 10,000 Units by mouth daily.     chromium picolinate tablet Take 200 mcg by mouth daily.     CINNAMON PO Take 1 tablet by mouth daily.     Clobetasol  Propionate 0.05 % lotion Apply to area of concern. 118 mL 0   dextromethorphan-guaiFENesin (MUCINEX DM) 30-600 MG 12hr tablet Take 1 tablet by mouth 2 (two) times daily.     Digestive Enzymes (DIGESTIVE ENZYME PO) Take 1 capsule by mouth daily.     ezetimibe  (ZETIA ) 10 MG tablet Take 1 tablet (10 mg total) by mouth daily. 90 tablet 1   fexofenadine (ALLEGRA) 180 MG tablet Take 180 mg by mouth 2 (two) times daily.     fluticasone (FLONASE) 50 MCG/ACT nasal spray Place 1-2 sprays into both nostrils daily as needed for allergies or rhinitis.     gabapentin (NEURONTIN) 100 MG capsule Take 100 mg by mouth as needed (pain).     glucose blood test strip Use to check blood glucose 3-4 times a day 100 each 12   JARDIANCE  25 MG TABS tablet TAKE 1 TABLET BY MOUTH EVERY DAY 30 tablet 5   MAGNESIUM PO Take 750 mg by mouth daily.     Melatonin 10 MG TBCR Take 10 mg by mouth at bedtime as needed (sleep).     metoprolol  succinate (TOPROL -XL) 25 MG 24 hr tablet Take 1 tablet (25 mg total) by mouth at bedtime. 90 tablet 3   OVER THE COUNTER MEDICATION Take 2 capsules by mouth daily. Brain dynamic supplement     Potassium 99 MG TABS Take 99 mg by mouth daily.     RESVERATROL PO Take 1 tablet by mouth daily.     SitaGLIPtin-MetFORMIN HCl (JANUMET  XR) 4325111435 MG TB24 Take 1 tablet by mouth every evening.      spironolactone (ALDACTONE) 25 MG tablet Take 0.5 tablets (12.5 mg total) by mouth daily. 45 tablet 3   triamcinolone (NASACORT ALLERGY 24HR) 55 MCG/ACT AERO nasal inhaler Place 1-2 sprays into the nose daily as needed (allergies).     ADEMPAS  1 MG TABS Take 1 tablet three times a day. Dose changes may occur throughout the course of therapy as directed by your prescriber. 90 tablet 0   ADEMPAS  1.5 MG TABS Take 1 tablet three times a day. Dose changes may occur throughout the course of therapy as directed by  your prescriber. 90 tablet 0   Ferrous Sulfate (IRON) 28 MG TABS Take 28 mg by mouth daily. (Patient not taking: Reported on 09/25/2024)     furosemide  (LASIX ) 20 MG tablet Take 2 tablets (40 mg total) by mouth daily. 180 tablet 3   Riociguat  (ADEMPAS ) 0.5 MG TABS Take 0.5 mg by mouth 3 (three) times daily.     No current facility-administered medications for this encounter.    PHYSICAL EXAM: BP 108/60   Pulse 78   Wt 120.2 kg (265 lb)   SpO2 94%   BMI 32.26 kg/m  General: NAD Neck: JVP 8-9 cm, no thyromegaly or thyroid nodule.  Lungs: Clear to auscultation bilaterally with normal respiratory effort. CV: Nondisplaced PMI.  Heart irregular S1/S2, no S3/S4, no murmur.  2+ edema to knees.  No carotid bruit.  Difficult to palpate pedal pulses.  Abdomen: Soft, nontender, no hepatosplenomegaly, no distention.  Skin: Intact without lesions or rashes.  Neurologic: Alert and oriented x 3.  Psych: Normal affect. Extremities: No clubbing or cyanosis.  HEENT: Normal.   ASSESSMENT & PLAN:  1.  Pulmonary hypertension/RV failure: Echo was done in 4/25, showing EF 55-60%, asymmetric septal hypertrophy, akinesis of the basal inferior wall, D-shaped septum, moderate RV dysfunction with moderate RV enlargement, PASP 75 mmHg, severe biatrial enlargement, mild MR, dilated IVC.  Given RV failure/pulmonary hypertension by echo, RHC was done in 7/25 showing moderate pulmonary arterial hypertension.   Perfusion only lung scan in 7/25 showed a perfusion defect concerning for chronic PE. Lower extremity venous dopplers in 6/25 did not show evidence for DVT. Concern for CTEPH (WHO group 4) vs WHO group 1.  He has been on apixaban  for AF but has also been very sedentary with chronic right hip osteomyelitis and wheelchair use.  NYHA class II symptoms.  He is volume overloaded on exam.  - He is on riociguat , will continue this (now on goal dose.  - There is data for Opsumit in both WHO group 1 and group 4 PH, will try to start this next.  - Not able to do 6 minute walk.  - Increase Lasix  to 40 mg daily. BMET/BNP today and in 10 days.  - Add spironolactone 12.5 daily.  - Continue Jardiance  10 mg daily.  - Need formal V/Q scan (had perfusion only scan) and CTA chest to help confirm diagnosis of CTEPH.  If CTEPH is confirmed, will refer to [facility] CTEPH program.  I do not think that he would be a candidate for pulmonary thromboendarterectomy, but he may be a BPA candidate.  - Check rheumatologic serologies: ANA, RF, anti-centromere, anti-SCL-70.  2. CAD: S/p CABG in 2011.  No chest pain.  - No ASA with apixaban  use.  - Continue Zetia , check lipids today => if LDL > 55, would refer to lipid clinic for Repatha (has had myalgias with statins).  3. Atrial fibrillation: Permanent. Rate is controlled.  - Continue apixaban .   Followup 1 month.   I spent 41 minutes reviewing records, interviewing/examining patient, and managing orders.   Ezra Shuck 09/27/2024

## 2024-09-28 ENCOUNTER — Other Ambulatory Visit (HOSPITAL_COMMUNITY): Payer: Self-pay

## 2024-09-28 ENCOUNTER — Telehealth (HOSPITAL_COMMUNITY): Payer: Self-pay

## 2024-09-28 NOTE — Telephone Encounter (Addendum)
 Pt aware, agreeable, and verbalized understanding  Wishes for referral to be sent to Dr. Jeannetta, unwilling to be referred to Lipid clinic.  ----- Message from Ezra Shuck sent at 09/27/2024 11:08 PM EDT ----- 1. 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  Given pulmonary hypertension, would refer to rheumatology (Dr. Jeannetta) for evaluation.   2. LDL > 55 (goal with CAD).  He is only on Zetia , intolerant to statins.  Please refer to lipid clinic for Repatha.  ----- Message ----- From: Rebecka, [facility] [place] Sent: 09/25/2024   4:21 PM EDT To: Ezra GORMAN Shuck, MD

## 2024-09-28 NOTE — Telephone Encounter (Signed)
 Advanced Heart Failure Patient Advocate Encounter  Patient consent obtained; Enrollment and prescription forms for Opsumit faxed to [employer] on 09/28/2024  Rachel DEL, CPhT Rx Patient Advocate Phone: [phone]

## 2024-09-28 NOTE — Telephone Encounter (Signed)
 Advanced Heart Failure Patient Advocate Encounter  Prior authorization for Opsumit has been submitted and denied for 90 day supply but approved for 30 day supply. Test billing returns $0 for 30 day supply.  Key: [id number] Effective: 09/25/2024 to 12/02/2025  Rachel DEL, CPhT Rx Patient Advocate Phone: [phone]

## 2024-09-30 ENCOUNTER — Telehealth (HOSPITAL_COMMUNITY): Payer: Self-pay

## 2024-09-30 NOTE — Telephone Encounter (Signed)
 Advanced Heart Failure Patient Advocate Encounter   Received notification that Adempas  has been shipped to patient by [facility] on 09/29/2024.   Rachel DEL, CPhT Rx Patient Advocate Phone: [phone]

## 2024-10-06 ENCOUNTER — Ambulatory Visit (HOSPITAL_COMMUNITY)
Admission: RE | Admit: 2024-10-06 | Discharge: 2024-10-06 | Disposition: A | Discharge status: Home / Self Care | Source: Ambulatory Visit | Attending: Cardiology | Admitting: Cardiology

## 2024-10-06 ENCOUNTER — Encounter (HOSPITAL_COMMUNITY)
Admission: RE | Admit: 2024-10-06 | Discharge: 2024-10-06 | Disposition: A | Discharge status: Home / Self Care | Source: Ambulatory Visit | Attending: Cardiology | Admitting: Cardiology

## 2024-10-06 ENCOUNTER — Ambulatory Visit (HOSPITAL_COMMUNITY)
Admission: RE | Admit: 2024-10-06 | Discharge: 2024-10-06 | Disposition: A | Source: Ambulatory Visit | Attending: Cardiology | Admitting: Cardiology

## 2024-10-06 DIAGNOSIS — I272 Pulmonary hypertension, unspecified: Secondary | ICD-10-CM | POA: Diagnosis present

## 2024-10-06 MED ORDER — IOHEXOL 350 MG/ML SOLN
75.0000 mL | Freq: Once | INTRAVENOUS | Status: AC | PRN
  Administered 2024-10-06: 75 mL via INTRAVENOUS

## 2024-10-06 MED ORDER — TECHNETIUM TO 99M ALBUMIN AGGREGATED
4.0000 | Freq: Once | INTRAVENOUS | Status: AC | PRN
  Administered 2024-10-06: 4 via INTRAVENOUS

## 2024-10-06 MED ORDER — SODIUM CHLORIDE (PF) 0.9 % IJ SOLN
INTRAMUSCULAR | Status: AC
  Filled 2024-10-06: qty 50

## 2024-10-07 NOTE — Telephone Encounter (Signed)
 Contacted [employer] for status update. Forms updated and refaxed on 10/07/2024.

## 2024-10-09 NOTE — Telephone Encounter (Signed)
 Received confirmation that Opsumit Voucher Program was shipped to patient on 10/09/2024

## 2024-10-12 NOTE — Telephone Encounter (Signed)
 Patient called to confirm he has received medication

## 2024-10-28 ENCOUNTER — Telehealth (HOSPITAL_COMMUNITY): Payer: Self-pay

## 2024-10-28 NOTE — Telephone Encounter (Signed)
 Advanced Heart Failure Patient Advocate Encounter   Received notification that Adempas  has been shipped to patient by [facility] on 10/26/2024.   Rachel DEL, CPhT Rx Patient Advocate Phone: [phone]

## 2024-11-02 ENCOUNTER — Other Ambulatory Visit (HOSPITAL_COMMUNITY): Payer: Self-pay

## 2024-11-02 ENCOUNTER — Ambulatory Visit (HOSPITAL_COMMUNITY): Payer: Self-pay | Admitting: Cardiology

## 2024-11-02 ENCOUNTER — Telehealth (HOSPITAL_COMMUNITY): Payer: Self-pay

## 2024-11-02 ENCOUNTER — Encounter (HOSPITAL_COMMUNITY): Payer: Self-pay | Admitting: Cardiology

## 2024-11-02 ENCOUNTER — Ambulatory Visit (HOSPITAL_COMMUNITY)
Admission: RE | Admit: 2024-11-02 | Discharge: 2024-11-02 | Disposition: A | Source: Ambulatory Visit | Attending: Cardiology | Admitting: Cardiology

## 2024-11-02 VITALS — BP 110/58 | HR 83

## 2024-11-02 DIAGNOSIS — E785 Hyperlipidemia, unspecified: Secondary | ICD-10-CM | POA: Diagnosis not present

## 2024-11-02 DIAGNOSIS — M791 Myalgia, unspecified site: Secondary | ICD-10-CM | POA: Diagnosis not present

## 2024-11-02 DIAGNOSIS — Z79899 Other long term (current) drug therapy: Secondary | ICD-10-CM | POA: Diagnosis not present

## 2024-11-02 DIAGNOSIS — Z792 Long term (current) use of antibiotics: Secondary | ICD-10-CM | POA: Diagnosis not present

## 2024-11-02 DIAGNOSIS — I4892 Unspecified atrial flutter: Secondary | ICD-10-CM | POA: Insufficient documentation

## 2024-11-02 DIAGNOSIS — I110 Hypertensive heart disease with heart failure: Secondary | ICD-10-CM | POA: Diagnosis not present

## 2024-11-02 DIAGNOSIS — Z993 Dependence on wheelchair: Secondary | ICD-10-CM | POA: Insufficient documentation

## 2024-11-02 DIAGNOSIS — I2721 Secondary pulmonary arterial hypertension: Secondary | ICD-10-CM | POA: Insufficient documentation

## 2024-11-02 DIAGNOSIS — I50810 Right heart failure, unspecified: Secondary | ICD-10-CM | POA: Diagnosis not present

## 2024-11-02 DIAGNOSIS — E669 Obesity, unspecified: Secondary | ICD-10-CM | POA: Insufficient documentation

## 2024-11-02 DIAGNOSIS — E1169 Type 2 diabetes mellitus with other specified complication: Secondary | ICD-10-CM | POA: Diagnosis not present

## 2024-11-02 DIAGNOSIS — I272 Pulmonary hypertension, unspecified: Secondary | ICD-10-CM

## 2024-11-02 DIAGNOSIS — E782 Mixed hyperlipidemia: Secondary | ICD-10-CM | POA: Diagnosis not present

## 2024-11-02 DIAGNOSIS — R0601 Orthopnea: Secondary | ICD-10-CM | POA: Diagnosis not present

## 2024-11-02 DIAGNOSIS — M8660 Other chronic osteomyelitis, unspecified site: Secondary | ICD-10-CM | POA: Insufficient documentation

## 2024-11-02 DIAGNOSIS — Z951 Presence of aortocoronary bypass graft: Secondary | ICD-10-CM | POA: Diagnosis not present

## 2024-11-02 DIAGNOSIS — Z7984 Long term (current) use of oral hypoglycemic drugs: Secondary | ICD-10-CM | POA: Diagnosis not present

## 2024-11-02 DIAGNOSIS — I878 Other specified disorders of veins: Secondary | ICD-10-CM | POA: Diagnosis not present

## 2024-11-02 DIAGNOSIS — I2510 Atherosclerotic heart disease of native coronary artery without angina pectoris: Secondary | ICD-10-CM | POA: Insufficient documentation

## 2024-11-02 DIAGNOSIS — Z96649 Presence of unspecified artificial hip joint: Secondary | ICD-10-CM | POA: Diagnosis not present

## 2024-11-02 DIAGNOSIS — I4821 Permanent atrial fibrillation: Secondary | ICD-10-CM | POA: Insufficient documentation

## 2024-11-02 DIAGNOSIS — Z7901 Long term (current) use of anticoagulants: Secondary | ICD-10-CM | POA: Diagnosis not present

## 2024-11-02 LAB — CBC
HCT: 30.8 % — ABNORMAL LOW (ref 39.0–52.0)
Hemoglobin: 10.2 g/dL — ABNORMAL LOW (ref 13.0–17.0)
MCH: 34.1 pg — ABNORMAL HIGH (ref 26.0–34.0)
MCHC: 33.1 g/dL (ref 30.0–36.0)
MCV: 103 fL — ABNORMAL HIGH (ref 80.0–100.0)
Platelets: 304 K/uL (ref 150–400)
RBC: 2.99 MIL/uL — ABNORMAL LOW (ref 4.22–5.81)
RDW: 22.6 % — ABNORMAL HIGH (ref 11.5–15.5)
WBC: 5.7 K/uL (ref 4.0–10.5)
nRBC: 0.4 % — ABNORMAL HIGH (ref 0.0–0.2)

## 2024-11-02 LAB — BASIC METABOLIC PANEL WITH GFR
Anion gap: 7 (ref 5–15)
BUN: 24 mg/dL — ABNORMAL HIGH (ref 8–23)
CO2: 28 mmol/L (ref 22–32)
Calcium: 8.4 mg/dL — ABNORMAL LOW (ref 8.9–10.3)
Chloride: 103 mmol/L (ref 98–111)
Creatinine, Ser: 0.9 mg/dL (ref 0.61–1.24)
GFR, Estimated: >60 mL/min (ref >60)
Glucose, Bld: 133 mg/dL — ABNORMAL HIGH (ref 70–99)
Potassium: 4.6 mmol/L (ref 3.5–5.1)
Sodium: 138 mmol/L (ref 135–145)

## 2024-11-02 LAB — BRAIN NATRIURETIC PEPTIDE: B Natriuretic Peptide: 195.2 pg/mL — ABNORMAL HIGH (ref 0.0–100.0)

## 2024-11-02 MED ORDER — TORSEMIDE 20 MG PO TABS: 40.0000 mg | ORAL_TABLET | Freq: Every day | ORAL | 3 refills | Status: DC

## 2024-11-02 NOTE — Telephone Encounter (Signed)
 Advanced Heart Failure Patient Advocate Encounter  Prior authorization for Steve [name] has been submitted and approved. Test billing returns $0 for 21 day supply.  Key: [id number] Effective: 12/04/2023 to 12/02/2025  Rachel DEL, CPhT Rx Patient Advocate Phone: [phone]

## 2024-11-02 NOTE — Progress Notes (Signed)
 ADVANCED HEART FAILURE CLINIC NOTE  Primary Care: Steve Lamarr RAMAN, MD Heart Failure Cardiology: Dr. Rolan  CC: Pulmonary hypertension  HPI: Steve [name] is a 68 y.o. male with atrial flutter, CAD s/p CABG in 2011, HTN, T2DM, chronic osteomyelitis of the right hip and elevated PA pressures by echocardiogram presenting today for follow up of CHF.  I am seeing him for the first time (previously followed by Dr. Gardenia).  I have reviewed all his prior records.   Patient had a hip replacement in 2019 complicated by femur fracture 1 week later requiring repeat surgery.  Prosthesis was removed in 2021 due to osteomyelitis.  He has been on chronic antibiotics due to recurrent right hip osteomyelitis.  He is essentially wheelchair-bound at this point.k   He is in permanent atrial fibrillation and has been on apixaban .   Echo was done in 4/25, showing EF 55-60%, asymmetric septal hypertrophy, akinesis of the basal inferior wall, D-shaped septum, moderate RV dysfunction with moderate RV enlargement, PASP 75 mmHg, severe biatrial enlargement, mild MR, dilated IVC.  Given RV failure/pulmonary hypertension by echo, RHC was done in 7/25 showing moderate pulmonary arterial hypertension.  Perfusion only lung scan in 7/25 showed a perfusion defect concerning for chronic PE. Lower extremity venous dopplers in 6/25 did not show evidence for DVT.   Repeat full V/Q scan was done in 11/25, showing no evidence for perfusion defect to suggests acute or chronic PE.  CTA chest in 11/25 showed no evidence for acute or chronic PE, no ILD, probably mild pulmonary edema.   Patient uses wheelchair most of the time, he is able to hop short distances in the house with his walker.  Unsure of weight, he has a hard time weighing.  He continues on Cipro/doxycycline  for chronic osteomyelitis.  He continues to have significant peripheral edema.  More shortness of breath recently with transfers and wheeling his chair.  He  has some orthopnea, sleeps in a recliner.  No chest pain.    Labs (10/25): K 4.1, creatinine 0.97 => 0.83, LDL 70, BNP 351, RF negative, ANA+/RNP+, anti-SCL 70 negative, anti-centromere negative.   PMH: 1. CAD: CABG x 4 in 2011.  2. Hip replacement in 2019 complicated by femur fracture 1 week later requiring repeat surgery.   - Removal of prosthesis in 2021 due to osteomyelitis.  - Chronic antibiotics due to recurrent right hip osteomyelitis.  3. HTN 4. Type 2 diabetes 5. Atrial fibrillation: Permanent.  - RHC with PVR 3.5, TPG of on 06/02/24 6. Pulmonary hypertension/RV failure:  - Echo (4/25): EF 55-60%, asymmetric septal hypertrophy, akinesis of the basal inferior wall, D-shaped septum, moderate RV dysfunction with moderate RV enlargement, PASP 75 mmHg, severe biatrial enlargement, mild MR, dilated IVC.  - Venous dopplers (6/25): No DVT.  - RHC (7/25): mean RA 10, PA 67/25 mean 39, mean PCWP 12, CI 3.15 Fick/3.2 thermo, PVR 3.5 WU.  - Perfusion scan (7/25): Perfusion defect that suggested chronic PE.  - Full V/Q scan (11/25): no evidence for perfusion defect to suggests acute or chronic PE. - CTA chest (11/25): no evidence for acute or chronic PE, no ILD, probably mild pulmonary edema.  7. Hyperlipidemia: Statin intolerance.    Social History   Socioeconomic History   Marital status: Divorced    Spouse name: Not on file   Number of children: Not on file   Years of education: Not on file   Highest education level: Not on file  Occupational History  Not on file  Tobacco Use   Smoking status: Never   Smokeless tobacco: Never  Vaping Use   Vaping status: Never Used  Substance and Sexual Activity   Alcohol use: Yes    Alcohol/week: 2.0 standard drinks of alcohol    Types: 1 Glasses of wine, 1 Shots of liquor per week    Comment: 07/02/2017 I don't drink q wk   Drug use: No   Sexual activity: Yes    Partners: Female  Other Topics Concern   Not on file  Social  History Narrative   Not on file   Social Drivers of Health   Financial Resource Strain: Not on file  Food Insecurity: Not on file  Transportation Needs: Not on file  Physical Activity: Not on file  Stress: Not on file  Social Connections: Not on file  Intimate Partner Violence: Not on file   Family History  Problem Relation Age of Onset   CAD Mother    CAD Father    ROS: All systems reviewed and negative except as per HPI.   Current Outpatient Medications  Medication Sig Dispense Refill   ADEMPAS  2.5 MG TABS Take 1 tablet three times a day. Dose changes may occur throughout the course of therapy as directed by your prescriber. 90 tablet 1   apixaban  (ELIQUIS ) 5 MG TABS tablet Take 1 tablet (5 mg total) by mouth 2 (two) times daily. 180 tablet 1   Apoaequorin (PREVAGEN EXTRA STRENGTH) 20 MG CAPS Take 20 mg by mouth daily.     cetirizine (ZYRTEC) 10 MG tablet Take 10 mg by mouth 2 (two) times daily.     Cholecalciferol (VITAMIN D3) 250 MCG (10000 UT) capsule Take 10,000 Units by mouth daily.     chromium picolinate tablet Take 200 mcg by mouth daily.     CINNAMON PO Take 1 tablet by mouth daily.     Clobetasol  Propionate 0.05 % lotion Apply to area of concern. 118 mL 0   dextromethorphan-guaiFENesin (MUCINEX DM) 30-600 MG 12hr tablet Take 1 tablet by mouth 2 (two) times daily.     Digestive Enzymes (DIGESTIVE ENZYME PO) Take 1 capsule by mouth daily.     ezetimibe  (ZETIA ) 10 MG tablet Take 1 tablet (10 mg total) by mouth daily. 90 tablet 1   Ferrous Sulfate (IRON) 28 MG TABS Take 28 mg by mouth daily.     fexofenadine (ALLEGRA) 180 MG tablet Take 180 mg by mouth 2 (two) times daily.     fluticasone (FLONASE) 50 MCG/ACT nasal spray Place 1-2 sprays into both nostrils daily as needed for allergies or rhinitis.     gabapentin (NEURONTIN) 100 MG capsule Take 100 mg by mouth as needed (pain).     glucose blood test strip Use to check blood glucose 3-4 times a day 100 each 12    JARDIANCE  25 MG TABS tablet TAKE 1 TABLET BY MOUTH EVERY DAY 30 tablet 5   MAGNESIUM PO Take 750 mg by mouth daily.     Melatonin 10 MG TBCR Take 10 mg by mouth at bedtime as needed (sleep).     metoprolol  succinate (TOPROL -XL) 25 MG 24 hr tablet Take 1 tablet (25 mg total) by mouth at bedtime. 90 tablet 3   OVER THE COUNTER MEDICATION Take 2 capsules by mouth daily. Brain dynamic supplement     Potassium 99 MG TABS Take 99 mg by mouth daily.     RESVERATROL PO Take 1 tablet by mouth daily.  SitaGLIPtin-MetFORMIN HCl (JANUMET  XR) [phone] MG TB24 Take 1 tablet by mouth every evening.     spironolactone  (ALDACTONE ) 25 MG tablet Take 0.5 tablets (12.5 mg total) by mouth daily. 45 tablet 3   torsemide  (DEMADEX ) 20 MG tablet Take 2 tablets (40 mg total) by mouth daily. 60 tablet 3   triamcinolone (NASACORT ALLERGY 24HR) 55 MCG/ACT AERO nasal inhaler Place 1-2 sprays into the nose daily as needed (allergies).     No current facility-administered medications for this encounter.    PHYSICAL EXAM: BP (!) 110/58   Pulse 83   SpO2 95%  General: NAD Neck: JVP 8-9 cm, no thyromegaly or thyroid nodule.  Lungs: Clear to auscultation bilaterally with normal respiratory effort. CV: Nondisplaced PMI.  Heart irregular S1/S2, no S3/S4, no murmur.  2+ edema to thighs L>R.  No carotid bruit.  Difficult to palpate pedal pulses with edema.  Abdomen: Soft, nontender, no hepatosplenomegaly, no distention.  Skin: Intact without lesions or rashes.  Neurologic: Alert and oriented x 3.  Psych: Normal affect. Extremities: No clubbing or cyanosis.  HEENT: Normal.   ASSESSMENT & PLAN:  1.  Pulmonary hypertension/RV failure: Echo was done in 4/25, showing EF 55-60%, asymmetric septal hypertrophy, akinesis of the basal inferior wall, D-shaped septum, moderate RV dysfunction with moderate RV enlargement, PASP 75 mmHg, severe biatrial enlargement, mild MR, dilated IVC.  Given RV failure/pulmonary hypertension by  echo, RHC was done in 7/25 showing moderate pulmonary arterial hypertension.  Perfusion only lung scan in 7/25 showed a perfusion defect concerning for chronic PE. Lower extremity venous dopplers in 6/25 did not show evidence for DVT.  Repeat full V/Q scan in 11/25 showed no acute or chronic PE, and CTA chest in 11/25 showed no evidence for acute or chronic PE, no ILD.  He has also been chronically on apixaban .  Probably not CTEPH.  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  NYHA class II-III symptoms.  He is still volume overloaded on exam.  - He is on riociguat , will continue this (now on goal dose).  - Continue Opsumit 10 mg daily.   - Add sotatercept as next step.  - Not able to do 6 minute walk.  - Stop Lasix , start torsemide  40 mg daily.  BMET/BNP today, BMET in 10 days.   - Continue spironolactone  12.5 daily.  - Continue Jardiance .  - With +ANA/+RNP, he has an appointment with rheumatology to assess for rheumatologic syndrome.   2. CAD: S/p CABG in 2011.  No chest pain.  - No ASA with apixaban  use.  - Myalgias with statin, currently on Zetia .  LDL > goal 55, send to lipid clinic to get on Repatha or Leqvio.   3. Atrial fibrillation: Permanent. Rate is controlled.  - Continue apixaban .  4. Obesity: I recommended that he consider GLP-1 agonist.  Will refer to pharmacy clinic to try to set this up.   Followup 1 month with APP.   I spent 42 minutes reviewing records, interviewing/examining patient, and managing orders.   Ezra Shuck 11/02/2024

## 2024-11-02 NOTE — Patient Instructions (Signed)
 Medication Changes:  STOP Furosemide   START Torsemide 40 mg (2 tabs) Daily  Your provider has ordered Winrevair (Sotatercept) to treat your Pulmonary Hypertension. This is a specialty medication that must come from a specialty pharmacy ([facility] or [facility]), important things to know:  This is a self administered injection given every 3 weeks A nurse from the specialty pharmacy will reach out to you to schedule an education session to discuss injection technique and side effects Lab work to check your blood count (CBC) is needed today, again in about 3 weeks after your first injection, and before your next injection, this will done before your first 5 injections When you receive your injection kit please call our office so we can schedule your lab work   Lab Work:  Labs done today, your results will be available in MyChart, we will contact you for abnormal readings.  Your physician recommends that you return for lab work in: 1-2 weeks  Referrals:  You have been referred to Pharmacy Clinic to discuss cholesterol and weight loss medication, they will call you to discuss/schedule appointment  Special Instructions // Education:  Do the following things EVERYDAY: Weigh yourself in the morning before breakfast. Write it down and keep it in a log. Take your medicines as prescribed Eat low salt foods--Limit salt (sodium) to 2000 mg per day.  Stay as active as you can everyday Limit all fluids for the day to less than 2 liters   Follow-Up in: 1 month   At the [facility], you and your health needs are our priority. We have a designated team specialized in the treatment of Heart Failure. This Care Team includes your primary Heart Failure Specialized Cardiologist (physician), Advanced Practice Providers (APPs- Physician Assistants and Nurse Practitioners), and Pharmacist who all work together to provide you with the care you need, when you need it.   You may see any  of the following providers on your designated Care Team at your next follow up:  Dr. Toribio Fuel Dr. Ezra Shuck Dr. Odis Brownie Greig Mosses, NP Caffie Shed, [place] [facility] [place], [place] Beckey Coe, NP Jordan Lee, NP Tinnie Redman, PharmD   Please be sure to bring in all your medications bottles to every appointment.   Need to Contact Us :  If you have any questions or concerns before your next appointment please send us  a message through [place] or call our office at [phone].    TO LEAVE A MESSAGE FOR THE NURSE SELECT OPTION 2, PLEASE LEAVE A MESSAGE INCLUDING: YOUR NAME DATE OF BIRTH CALL BACK NUMBER REASON FOR CALL**this is important as we prioritize the call backs  YOU WILL RECEIVE A CALL BACK THE SAME DAY AS LONG AS YOU CALL BEFORE 4:00 PM

## 2024-11-03 ENCOUNTER — Other Ambulatory Visit (HOSPITAL_COMMUNITY): Payer: Self-pay

## 2024-11-03 ENCOUNTER — Telehealth (HOSPITAL_COMMUNITY): Payer: Self-pay

## 2024-11-03 NOTE — Telephone Encounter (Signed)
 Advanced Heart Failure Patient Advocate Encounter  Enrollment and Prescription form for Winrevair faxed to [employer] on 11/03/2024.  Rachel DEL, CPhT Rx Patient Advocate Phone: [phone]

## 2024-11-09 NOTE — Telephone Encounter (Signed)
 Spoke to [employer] for status update, forms have been received and processed, error with patient phone number was corrected. [employer] will make an additional outreach attempt to patient to establish care and deliver medication.  Patients assigned navigator is Charkayla, extension [phone]

## 2024-11-11 NOTE — Telephone Encounter (Signed)
 Patient spoke to [employer] and prescription is being triaged to [facility]. Patient will contact me if there are issues or concerns with shipments going forward.

## 2024-11-12 ENCOUNTER — Other Ambulatory Visit (HOSPITAL_COMMUNITY): Payer: Self-pay

## 2024-11-12 ENCOUNTER — Ambulatory Visit (HOSPITAL_COMMUNITY)

## 2024-11-12 DIAGNOSIS — I4891 Unspecified atrial fibrillation: Secondary | ICD-10-CM

## 2024-11-12 MED ORDER — TORSEMIDE 20 MG PO TABS: 40.0000 mg | ORAL_TABLET | Freq: Every day | ORAL | 5 refills | Status: AC

## 2024-11-12 MED ORDER — MACITENTAN 10 MG PO TABS: 10.0000 mg | ORAL_TABLET | Freq: Every day | ORAL | 11 refills | Status: AC

## 2024-11-19 ENCOUNTER — Ambulatory Visit (HOSPITAL_COMMUNITY)

## 2024-11-30 ENCOUNTER — Other Ambulatory Visit (HOSPITAL_COMMUNITY): Payer: Self-pay

## 2024-12-01 ENCOUNTER — Ambulatory Visit (HOSPITAL_COMMUNITY)
Admission: RE | Admit: 2024-12-01 | Discharge: 2024-12-01 | Disposition: A | Discharge status: Home / Self Care | Source: Ambulatory Visit | Attending: Cardiology | Admitting: Cardiology

## 2024-12-01 DIAGNOSIS — I50810 Right heart failure, unspecified: Secondary | ICD-10-CM | POA: Diagnosis present

## 2024-12-01 DIAGNOSIS — I272 Pulmonary hypertension, unspecified: Secondary | ICD-10-CM | POA: Diagnosis not present

## 2024-12-01 LAB — BASIC METABOLIC PANEL WITH GFR
Anion gap: 5 (ref 5–15)
BUN: 17 mg/dL (ref 8–23)
CO2: 30 mmol/L (ref 22–32)
Calcium: 8.8 mg/dL — ABNORMAL LOW (ref 8.9–10.3)
Chloride: 101 mmol/L (ref 98–111)
Creatinine, Ser: 0.88 mg/dL (ref 0.61–1.24)
GFR, Estimated: >60 mL/min
Glucose, Bld: 120 mg/dL — ABNORMAL HIGH (ref 70–99)
Potassium: 4.5 mmol/L (ref 3.5–5.1)
Sodium: 136 mmol/L (ref 135–145)

## 2024-12-02 ENCOUNTER — Other Ambulatory Visit (HOSPITAL_COMMUNITY): Payer: Self-pay

## 2024-12-02 MED ORDER — ADEMPAS 2.5 MG PO TABS: 1.0000 | ORAL_TABLET | Freq: Three times a day (TID) | ORAL | 1 refills | Status: DC

## 2024-12-04 ENCOUNTER — Other Ambulatory Visit (HOSPITAL_COMMUNITY): Payer: Self-pay | Admitting: Cardiology

## 2024-12-04 ENCOUNTER — Other Ambulatory Visit: Payer: Self-pay | Admitting: Medical Genetics

## 2024-12-04 ENCOUNTER — Encounter (HOSPITAL_COMMUNITY): Payer: Self-pay

## 2024-12-04 MED ORDER — ADEMPAS 2.5 MG PO TABS: 1.0000 | ORAL_TABLET | Freq: Three times a day (TID) | ORAL | 1 refills | Status: AC

## 2024-12-08 ENCOUNTER — Ambulatory Visit (HOSPITAL_COMMUNITY)

## 2024-12-09 ENCOUNTER — Ambulatory Visit (HOSPITAL_COMMUNITY)

## 2024-12-10 ENCOUNTER — Ambulatory Visit (HOSPITAL_COMMUNITY): Payer: Self-pay | Admitting: Cardiology

## 2024-12-10 ENCOUNTER — Ambulatory Visit (HOSPITAL_COMMUNITY)
Admission: RE | Admit: 2024-12-10 | Discharge: 2024-12-10 | Disposition: A | Discharge status: Home / Self Care | Source: Ambulatory Visit | Attending: Internal Medicine | Admitting: Internal Medicine

## 2024-12-10 ENCOUNTER — Other Ambulatory Visit (HOSPITAL_COMMUNITY): Payer: Self-pay

## 2024-12-10 DIAGNOSIS — I4891 Unspecified atrial fibrillation: Secondary | ICD-10-CM | POA: Insufficient documentation

## 2024-12-10 LAB — CBC
HCT: 34.3 % — ABNORMAL LOW (ref 39.0–52.0)
Hemoglobin: 11.3 g/dL — ABNORMAL LOW (ref 13.0–17.0)
MCH: 34.7 pg — ABNORMAL HIGH (ref 26.0–34.0)
MCHC: 32.9 g/dL (ref 30.0–36.0)
MCV: 105.2 fL — ABNORMAL HIGH (ref 80.0–100.0)
Platelets: 372 K/uL (ref 150–400)
RBC: 3.26 MIL/uL — ABNORMAL LOW (ref 4.22–5.81)
RDW: 22.9 % — ABNORMAL HIGH (ref 11.5–15.5)
WBC: 5.3 K/uL (ref 4.0–10.5)
nRBC: 0.7 % — ABNORMAL HIGH (ref 0.0–0.2)

## 2024-12-17 ENCOUNTER — Ambulatory Visit (HOSPITAL_COMMUNITY)

## 2024-12-18 ENCOUNTER — Telehealth (HOSPITAL_COMMUNITY): Payer: Self-pay

## 2024-12-18 NOTE — Telephone Encounter (Signed)
 Called to confirm/remind patient of their appointment at the [facility] on 12/21/24.   Appointment:   [] Confirmed  [x] Left mess   [] No answer/No voice mail  [] VM Full/unable to leave message  [] Phone not in service  And to bring in all medications and/or complete list.

## 2024-12-20 NOTE — Progress Notes (Signed)
 "  ADVANCED HEART FAILURE CLINIC NOTE  Primary Care: Chrystal Lamarr RAMAN, MD Heart Failure Cardiology: Dr. Rolan  CC: Pulmonary hypertension  HPI: Steve [name] is a 69 y.o. male with atrial flutter, CAD s/p CABG in 2011, HTN, T2DM, chronic osteomyelitis of the right hip and elevated PA pressures by echocardiogram.  Patient had a hip replacement in 2019 complicated by femur fracture 1 week later requiring repeat surgery.  Prosthesis was removed in 2021 due to osteomyelitis.  He has been on chronic antibiotics due to recurrent right hip osteomyelitis.  He is essentially wheelchair-bound at this point.k   He is in permanent atrial fibrillation and has been on apixaban .   Echo was done in 4/25, showing EF 55-60%, asymmetric septal hypertrophy, akinesis of the basal inferior wall, D-shaped septum, moderate RV dysfunction with moderate RV enlargement, PASP 75 mmHg, severe biatrial enlargement, mild MR, dilated IVC.  Given RV failure/pulmonary hypertension by echo, RHC was done in 7/25 showing moderate pulmonary arterial hypertension.  Perfusion only lung scan in 7/25 showed a perfusion defect concerning for chronic PE. Lower extremity venous dopplers in 6/25 did not show evidence for DVT.   Repeat full V/Q scan was done in 11/25, showing no evidence for perfusion defect to suggests acute or chronic PE.  CTA chest in 11/25 showed no evidence for acute or chronic PE, no ILD, probably mild pulmonary edema.   Started Sotarcept. Working with [facility]. Has completed 2 doses. No issues.   Today he returns for HF follow up with his caregiver. Overall feeling fine. Limited by R leg weakness. Wheelchair bound. Denies SOB/PND/Orthopnea. No bleeding issues. Appetite ok. No fever or chills. Taking all medications. Completely Works remotely from home. Has a caregiver to help with transportation.   Labs (10/25): K 4.1, creatinine 0.97 => 0.83, LDL 70, BNP 351, RF negative, ANA+/RNP+, anti-SCL 70 negative,  anti-centromere negative.   PMH: 1. CAD: CABG x 4 in 2011.  2. Hip replacement in 2019 complicated by femur fracture 1 week later requiring repeat surgery.   - Removal of prosthesis in 2021 due to osteomyelitis.  - Chronic antibiotics due to recurrent right hip osteomyelitis.  3. HTN 4. Type 2 diabetes 5. Atrial fibrillation: Permanent.  - RHC with PVR 3.5, TPG of on 06/02/24 6. Pulmonary hypertension/RV failure:  - Echo (4/25): EF 55-60%, asymmetric septal hypertrophy, akinesis of the basal inferior wall, D-shaped septum, moderate RV dysfunction with moderate RV enlargement, PASP 75 mmHg, severe biatrial enlargement, mild MR, dilated IVC.  - Venous dopplers (6/25): No DVT.  - RHC (7/25): mean RA 10, PA 67/25 mean 39, mean PCWP 12, CI 3.15 Fick/3.2 thermo, PVR 3.5 WU.  - Perfusion scan (7/25): Perfusion defect that suggested chronic PE.  - Full V/Q scan (11/25): no evidence for perfusion defect to suggests acute or chronic PE. - CTA chest (11/25): no evidence for acute or chronic PE, no ILD, probably mild pulmonary edema.  7. Hyperlipidemia: Statin intolerance.    Social History   Socioeconomic History   Marital status: Divorced    Spouse name: Not on file   Number of children: Not on file   Years of education: Not on file   Highest education level: Not on file  Occupational History   Not on file  Tobacco Use   Smoking status: Never   Smokeless tobacco: Never  Vaping Use   Vaping status: Never Used  Substance and Sexual Activity   Alcohol use: Yes    Alcohol/week: 2.0 standard  drinks of alcohol    Types: 1 Glasses of wine, 1 Shots of liquor per week    Comment: 07/02/2017 I don't drink q wk   Drug use: No   Sexual activity: Yes    Partners: Female  Other Topics Concern   Not on file  Social History Narrative   Not on file   Social Drivers of Health   Tobacco Use: Low Risk (12/21/2024)   Patient History    Smoking Tobacco Use: Never    Smokeless Tobacco Use:  Never    Passive Exposure: Not on file  Financial Resource Strain: Not on file  Food Insecurity: Not on file  Transportation Needs: Not on file  Physical Activity: Not on file  Stress: Not on file  Social Connections: Not on file  Intimate Partner Violence: Not on file  Depression (EYV7-0): Not on file  Alcohol Screen: Not on file  Housing: Not on file  Utilities: Not on file  Health Literacy: Not on file   Family History  Problem Relation Age of Onset   CAD Mother    CAD Father    ROS: All systems reviewed and negative except as per HPI.   Current Outpatient Medications  Medication Sig Dispense Refill   ADEMPAS  2.5 MG TABS Take 1 tablet by mouth in the morning, at noon, and at bedtime. 90 tablet 1   apixaban  (ELIQUIS ) 5 MG TABS tablet Take 1 tablet (5 mg total) by mouth 2 (two) times daily. 180 tablet 1   Apoaequorin (PREVAGEN EXTRA STRENGTH) 20 MG CAPS Take 20 mg by mouth daily.     cetirizine (ZYRTEC) 10 MG tablet Take 10 mg by mouth 2 (two) times daily.     Cholecalciferol (VITAMIN D3) 250 MCG (10000 UT) capsule Take 10,000 Units by mouth daily.     chromium picolinate tablet Take 200 mcg by mouth daily.     CINNAMON PO Take 1 tablet by mouth daily.     Clobetasol  Propionate 0.05 % lotion Apply to area of concern. 118 mL 0   dextromethorphan-guaiFENesin (MUCINEX DM) 30-600 MG 12hr tablet Take 1 tablet by mouth 2 (two) times daily.     Digestive Enzymes (DIGESTIVE ENZYME PO) Take 1 capsule by mouth daily.     ezetimibe  (ZETIA ) 10 MG tablet Take 1 tablet (10 mg total) by mouth daily. 90 tablet 1   Ferrous Sulfate (IRON) 28 MG TABS Take 28 mg by mouth daily.     fexofenadine (ALLEGRA) 180 MG tablet Take 180 mg by mouth 2 (two) times daily.     fluticasone (FLONASE) 50 MCG/ACT nasal spray Place 1-2 sprays into both nostrils daily as needed for allergies or rhinitis.     gabapentin (NEURONTIN) 100 MG capsule Take 100 mg by mouth as needed (pain).     glucose blood test strip Use  to check blood glucose 3-4 times a day 100 each 12   JARDIANCE  25 MG TABS tablet TAKE 1 TABLET BY MOUTH EVERY DAY 30 tablet 5   macitentan  (OPSUMIT ) 10 MG tablet Take 1 tablet (10 mg total) by mouth daily. 30 tablet 11   MAGNESIUM PO Take 750 mg by mouth daily.     Melatonin 10 MG TBCR Take 10 mg by mouth at bedtime as needed (sleep).     metoprolol  succinate (TOPROL -XL) 25 MG 24 hr tablet Take 1 tablet (25 mg total) by mouth at bedtime. 90 tablet 3   OVER THE COUNTER MEDICATION Take 2 capsules by mouth daily. Brain  dynamic supplement     Potassium 99 MG TABS Take 99 mg by mouth daily.     RESVERATROL PO Take 1 tablet by mouth daily.     SitaGLIPtin-MetFORMIN HCl (JANUMET  XR) [phone] MG TB24 Take 1 tablet by mouth every evening.     spironolactone  (ALDACTONE ) 25 MG tablet Take 0.5 tablets (12.5 mg total) by mouth daily. 45 tablet 3   torsemide  (DEMADEX ) 20 MG tablet Take 2 tablets (40 mg total) by mouth daily. 60 tablet 5   triamcinolone (NASACORT ALLERGY 24HR) 55 MCG/ACT AERO nasal inhaler Place 1-2 sprays into the nose daily as needed (allergies).     No current facility-administered medications for this encounter.    PHYSICAL EXAM: BP 110/62   Pulse 78   Ht 6' 4 (1.93 m)   Wt 124.7 kg (275 lb)   SpO2 94%   BMI 33.47 kg/m  General:   No resp difficulty. Arrived in a wheelchair.  Neck: no JVD.  Cor: Irregular rate & rhythm. Lungs: clear Abdomen: soft, nontender, nondistended.  Extremities: R and LLE no  edema Neuro: alert & oriented x3 ASSESSMENT & PLAN:  1.  Pulmonary hypertension/RV failure: Echo was done in 4/25, showing EF 55-60%, asymmetric septal hypertrophy, akinesis of the basal inferior wall, D-shaped septum, moderate RV dysfunction with moderate RV enlargement, PASP 75 mmHg, severe biatrial enlargement, mild MR, dilated IVC.  Given RV failure/pulmonary hypertension by echo, RHC was done in 7/25 showing moderate pulmonary arterial hypertension.  Perfusion only lung scan  in 7/25 showed a perfusion defect concerning for chronic PE. Lower extremity venous dopplers in 6/25 did not show evidence for DVT.  Repeat full V/Q scan in 11/25 showed no acute or chronic PE, and CTA chest in 11/25 showed no evidence for acute or chronic PE, no ILD.  He has also been chronically on apixaban .  Probably not CTEPH.  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   NYHA class.  - He is on riociguat , will continue this (now on goal dose).  - Continue Opsumit  10 mg daily.   -Receiving Sotarecept from [facility].  Plan to check CBC /BMET 12/31/24  - Not able to do 6 minute walk.  - Continue torsemide  40 mg daily.   - Continue spironolactone  12.5 daily.  - Continue Jardiance .  - With +ANA/+RNP, he has an appointment with rheumatology to assess for rheumatologic syndrome.   2. CAD: S/p CABG in 2011.  No chest pain.  - No ASA with apixaban  use.  - Myalgias with statin, currently on Zetia .  LDL > goal 55, send to lipid clinic to get on Repatha or Leqvio.   3. Atrial fibrillation: Permanent. Rate controlled.   - Continue apixaban .  4. Obesity: Body mass index is 33.47 kg/m.   Follow up in January 29th for CBC and BMET  Follow up in 2 months with an Echo and Dr Rolan   I spent 30 minutes reviewing records, interviewing/examining patient, and managing orders.      Tiron Suski NP-C  12/21/2024  "

## 2024-12-21 ENCOUNTER — Encounter (HOSPITAL_COMMUNITY): Payer: Self-pay

## 2024-12-21 ENCOUNTER — Ambulatory Visit (HOSPITAL_COMMUNITY)
Admission: RE | Admit: 2024-12-21 | Discharge: 2024-12-21 | Disposition: A | Source: Ambulatory Visit | Attending: Adult Health | Admitting: Adult Health

## 2024-12-21 VITALS — BP 110/62 | HR 78 | Ht 76.0 in | Wt 275.0 lb

## 2024-12-21 DIAGNOSIS — Z79899 Other long term (current) drug therapy: Secondary | ICD-10-CM | POA: Diagnosis not present

## 2024-12-21 DIAGNOSIS — Z7901 Long term (current) use of anticoagulants: Secondary | ICD-10-CM | POA: Diagnosis not present

## 2024-12-21 DIAGNOSIS — Z993 Dependence on wheelchair: Secondary | ICD-10-CM | POA: Diagnosis not present

## 2024-12-21 DIAGNOSIS — I4892 Unspecified atrial flutter: Secondary | ICD-10-CM | POA: Diagnosis not present

## 2024-12-21 DIAGNOSIS — Z792 Long term (current) use of antibiotics: Secondary | ICD-10-CM | POA: Insufficient documentation

## 2024-12-21 DIAGNOSIS — I10 Essential (primary) hypertension: Secondary | ICD-10-CM | POA: Diagnosis not present

## 2024-12-21 DIAGNOSIS — I4821 Permanent atrial fibrillation: Secondary | ICD-10-CM | POA: Insufficient documentation

## 2024-12-21 DIAGNOSIS — I482 Chronic atrial fibrillation, unspecified: Secondary | ICD-10-CM

## 2024-12-21 DIAGNOSIS — E669 Obesity, unspecified: Secondary | ICD-10-CM | POA: Diagnosis not present

## 2024-12-21 DIAGNOSIS — I2721 Secondary pulmonary arterial hypertension: Secondary | ICD-10-CM | POA: Diagnosis not present

## 2024-12-21 DIAGNOSIS — M8668 Other chronic osteomyelitis, other site: Secondary | ICD-10-CM | POA: Diagnosis not present

## 2024-12-21 DIAGNOSIS — Z951 Presence of aortocoronary bypass graft: Secondary | ICD-10-CM | POA: Insufficient documentation

## 2024-12-21 DIAGNOSIS — I340 Nonrheumatic mitral (valve) insufficiency: Secondary | ICD-10-CM | POA: Insufficient documentation

## 2024-12-21 DIAGNOSIS — E119 Type 2 diabetes mellitus without complications: Secondary | ICD-10-CM | POA: Insufficient documentation

## 2024-12-21 DIAGNOSIS — Z7984 Long term (current) use of oral hypoglycemic drugs: Secondary | ICD-10-CM | POA: Insufficient documentation

## 2024-12-21 DIAGNOSIS — I110 Hypertensive heart disease with heart failure: Secondary | ICD-10-CM | POA: Diagnosis not present

## 2024-12-21 DIAGNOSIS — I2510 Atherosclerotic heart disease of native coronary artery without angina pectoris: Secondary | ICD-10-CM | POA: Insufficient documentation

## 2024-12-21 DIAGNOSIS — I272 Pulmonary hypertension, unspecified: Secondary | ICD-10-CM | POA: Insufficient documentation

## 2024-12-21 DIAGNOSIS — Z6833 Body mass index (BMI) 33.0-33.9, adult: Secondary | ICD-10-CM | POA: Insufficient documentation

## 2024-12-21 MED ORDER — WINREVAIR 2 X 45 MG ~~LOC~~ KIT: PACK | SUBCUTANEOUS | Status: AC

## 2024-12-21 NOTE — Patient Instructions (Addendum)
 No change in medications. Return for labs on 12/31/24 - see below. Return to see Dr. Rolan with echo in 2 months - see below. Please call us  at [phone] if any questions or concerns prior to your next appointment.

## 2024-12-31 ENCOUNTER — Ambulatory Visit (HOSPITAL_COMMUNITY)

## 2025-01-04 ENCOUNTER — Telehealth (HOSPITAL_COMMUNITY): Payer: Self-pay | Admitting: Pharmacist

## 2025-01-04 NOTE — Telephone Encounter (Signed)
 Patient Advocate Encounter   Received notification from [facility] that prior authorization for Adempas  is required.   PA submitted on CoverMyMeds Key [id number] Status is pending   Will continue to follow.   Tinnie Redman, PharmD, BCPS, BCCP, CPP Heart Failure Clinic Pharmacist [phone]

## 2025-01-06 ENCOUNTER — Ambulatory Visit (HOSPITAL_COMMUNITY)
Admission: RE | Admit: 2025-01-06 | Discharge: 2025-01-06 | Disposition: A | Source: Ambulatory Visit | Attending: Cardiology

## 2025-01-06 DIAGNOSIS — I2721 Secondary pulmonary arterial hypertension: Secondary | ICD-10-CM

## 2025-01-06 DIAGNOSIS — I10 Essential (primary) hypertension: Secondary | ICD-10-CM

## 2025-01-06 LAB — CBC
HCT: 32.6 % — ABNORMAL LOW (ref 39.0–52.0)
Hemoglobin: 10.6 g/dL — ABNORMAL LOW (ref 13.0–17.0)
MCH: 33.1 pg (ref 26.0–34.0)
MCHC: 32.5 g/dL (ref 30.0–36.0)
MCV: 101.9 fL — ABNORMAL HIGH (ref 80.0–100.0)
Platelets: 333 10*3/uL (ref 150–400)
RBC: 3.2 MIL/uL — ABNORMAL LOW (ref 4.22–5.81)
RDW: 23.8 % — ABNORMAL HIGH (ref 11.5–15.5)
WBC: 4.8 10*3/uL (ref 4.0–10.5)
nRBC: 1.3 % — ABNORMAL HIGH (ref 0.0–0.2)

## 2025-01-06 LAB — BASIC METABOLIC PANEL WITH GFR
Anion gap: 9 (ref 5–15)
BUN: 33 mg/dL — ABNORMAL HIGH (ref 8–23)
CO2: 30 mmol/L (ref 22–32)
Calcium: 9.5 mg/dL (ref 8.9–10.3)
Chloride: 97 mmol/L — ABNORMAL LOW (ref 98–111)
Creatinine, Ser: 0.94 mg/dL (ref 0.61–1.24)
GFR, Estimated: >60 mL/min
Glucose, Bld: 156 mg/dL — ABNORMAL HIGH (ref 70–99)
Potassium: 4.2 mmol/L (ref 3.5–5.1)
Sodium: 136 mmol/L (ref 135–145)

## 2025-01-07 ENCOUNTER — Ambulatory Visit (HOSPITAL_COMMUNITY): Payer: Self-pay | Admitting: Adult Health

## 2025-01-25 ENCOUNTER — Ambulatory Visit: Admitting: Internal Medicine

## 2025-02-26 ENCOUNTER — Other Ambulatory Visit (HOSPITAL_COMMUNITY)

## 2025-02-26 ENCOUNTER — Ambulatory Visit (HOSPITAL_COMMUNITY): Admitting: Cardiology
